# Patient Record
Sex: Female | Born: 1955 | Race: White | Hispanic: No | Marital: Married | Smoking: Never smoker
Health system: Southern US, Community
[De-identification: ages and names within clinical notes are randomized; demographics above are authoritative.]

## PROBLEM LIST (undated history)

## (undated) DIAGNOSIS — R519 Headache, unspecified: Secondary | ICD-10-CM

## (undated) DIAGNOSIS — Z87442 Personal history of urinary calculi: Secondary | ICD-10-CM

## (undated) DIAGNOSIS — R51 Headache: Secondary | ICD-10-CM

## (undated) DIAGNOSIS — R0600 Dyspnea, unspecified: Secondary | ICD-10-CM

## (undated) DIAGNOSIS — M8580 Other specified disorders of bone density and structure, unspecified site: Secondary | ICD-10-CM

## (undated) DIAGNOSIS — M859 Disorder of bone density and structure, unspecified: Secondary | ICD-10-CM

## (undated) DIAGNOSIS — C439 Malignant melanoma of skin, unspecified: Secondary | ICD-10-CM

## (undated) DIAGNOSIS — R112 Nausea with vomiting, unspecified: Secondary | ICD-10-CM

## (undated) DIAGNOSIS — E785 Hyperlipidemia, unspecified: Secondary | ICD-10-CM

## (undated) DIAGNOSIS — Z8781 Personal history of (healed) traumatic fracture: Secondary | ICD-10-CM

## (undated) DIAGNOSIS — Z531 Procedure and treatment not carried out because of patient's decision for reasons of belief and group pressure: Secondary | ICD-10-CM

## (undated) HISTORY — PX: OTHER SURGICAL HISTORY: SHX169

## (undated) HISTORY — PX: CHOLECYSTECTOMY: SHX55

## (undated) HISTORY — PX: OVARIAN CYST SURGERY: SHX726

## (undated) HISTORY — PX: ABDOMINAL HYSTERECTOMY: SHX81

## (undated) HISTORY — DX: Hyperlipidemia, unspecified: E78.5

---

## 1980-11-03 DIAGNOSIS — Z8781 Personal history of (healed) traumatic fracture: Secondary | ICD-10-CM

## 1980-11-03 HISTORY — DX: Personal history of (healed) traumatic fracture: Z87.81

## 1998-11-03 HISTORY — PX: COLONOSCOPY: SHX174

## 1998-11-26 ENCOUNTER — Other Ambulatory Visit: Admission: RE | Admit: 1998-11-26 | Discharge: 1998-11-26 | Payer: Self-pay | Admitting: Gynecology

## 1999-03-27 ENCOUNTER — Other Ambulatory Visit: Admission: RE | Admit: 1999-03-27 | Discharge: 1999-03-27 | Payer: Self-pay | Admitting: Gynecology

## 2000-02-24 ENCOUNTER — Other Ambulatory Visit: Admission: RE | Admit: 2000-02-24 | Discharge: 2000-02-24 | Payer: Self-pay | Admitting: Gynecology

## 2000-04-29 ENCOUNTER — Inpatient Hospital Stay (HOSPITAL_COMMUNITY): Admission: RE | Admit: 2000-04-29 | Discharge: 2000-05-01 | Payer: Self-pay | Admitting: Gynecology

## 2000-04-29 ENCOUNTER — Encounter (INDEPENDENT_AMBULATORY_CARE_PROVIDER_SITE_OTHER): Payer: Self-pay

## 2000-07-31 ENCOUNTER — Encounter (INDEPENDENT_AMBULATORY_CARE_PROVIDER_SITE_OTHER): Payer: Self-pay | Admitting: Specialist

## 2000-07-31 ENCOUNTER — Other Ambulatory Visit: Admission: RE | Admit: 2000-07-31 | Discharge: 2000-07-31 | Payer: Self-pay | Admitting: Gynecology

## 2001-03-24 ENCOUNTER — Encounter: Admission: RE | Admit: 2001-03-24 | Discharge: 2001-03-24 | Payer: Self-pay | Admitting: Gynecology

## 2001-03-24 ENCOUNTER — Encounter: Payer: Self-pay | Admitting: Gynecology

## 2001-07-13 ENCOUNTER — Other Ambulatory Visit: Admission: RE | Admit: 2001-07-13 | Discharge: 2001-07-13 | Payer: Self-pay | Admitting: Gynecology

## 2002-08-02 ENCOUNTER — Emergency Department (HOSPITAL_COMMUNITY): Admission: EM | Admit: 2002-08-02 | Discharge: 2002-08-02 | Payer: Self-pay | Admitting: Emergency Medicine

## 2002-08-02 ENCOUNTER — Encounter: Payer: Self-pay | Admitting: Emergency Medicine

## 2003-04-25 ENCOUNTER — Other Ambulatory Visit: Admission: RE | Admit: 2003-04-25 | Discharge: 2003-04-25 | Payer: Self-pay | Admitting: Family Medicine

## 2003-07-25 ENCOUNTER — Encounter: Payer: Self-pay | Admitting: Family Medicine

## 2003-07-25 ENCOUNTER — Encounter: Admission: RE | Admit: 2003-07-25 | Discharge: 2003-07-25 | Payer: Self-pay | Admitting: Family Medicine

## 2004-06-04 ENCOUNTER — Other Ambulatory Visit: Admission: RE | Admit: 2004-06-04 | Discharge: 2004-06-04 | Payer: Self-pay | Admitting: Internal Medicine

## 2004-06-04 ENCOUNTER — Encounter (INDEPENDENT_AMBULATORY_CARE_PROVIDER_SITE_OTHER): Payer: Self-pay | Admitting: Internal Medicine

## 2004-06-04 LAB — CONVERTED CEMR LAB: Pap Smear: NORMAL

## 2005-06-06 ENCOUNTER — Encounter: Admission: RE | Admit: 2005-06-06 | Discharge: 2005-06-06 | Payer: Self-pay | Admitting: Family Medicine

## 2005-06-20 ENCOUNTER — Encounter: Admission: RE | Admit: 2005-06-20 | Discharge: 2005-06-20 | Payer: Self-pay | Admitting: Family Medicine

## 2005-07-02 ENCOUNTER — Ambulatory Visit: Payer: Self-pay | Admitting: Family Medicine

## 2006-05-15 ENCOUNTER — Encounter: Admission: RE | Admit: 2006-05-15 | Discharge: 2006-05-15 | Payer: Self-pay | Admitting: Orthopedic Surgery

## 2006-11-18 ENCOUNTER — Ambulatory Visit: Payer: Self-pay | Admitting: Internal Medicine

## 2007-03-07 ENCOUNTER — Encounter (INDEPENDENT_AMBULATORY_CARE_PROVIDER_SITE_OTHER): Payer: Self-pay | Admitting: Internal Medicine

## 2007-03-17 ENCOUNTER — Ambulatory Visit: Payer: Self-pay | Admitting: Physician Assistant

## 2007-03-18 ENCOUNTER — Ambulatory Visit: Payer: Self-pay | Admitting: Physician Assistant

## 2007-03-30 ENCOUNTER — Ambulatory Visit: Payer: Self-pay | Admitting: Unknown Physician Specialty

## 2007-04-28 ENCOUNTER — Ambulatory Visit: Payer: Self-pay | Admitting: Pain Medicine

## 2007-06-23 ENCOUNTER — Telehealth (INDEPENDENT_AMBULATORY_CARE_PROVIDER_SITE_OTHER): Payer: Self-pay | Admitting: *Deleted

## 2007-06-29 ENCOUNTER — Telehealth (INDEPENDENT_AMBULATORY_CARE_PROVIDER_SITE_OTHER): Payer: Self-pay | Admitting: Internal Medicine

## 2007-07-01 ENCOUNTER — Telehealth (INDEPENDENT_AMBULATORY_CARE_PROVIDER_SITE_OTHER): Payer: Self-pay | Admitting: *Deleted

## 2007-08-04 ENCOUNTER — Telehealth (INDEPENDENT_AMBULATORY_CARE_PROVIDER_SITE_OTHER): Payer: Self-pay | Admitting: *Deleted

## 2007-08-11 ENCOUNTER — Encounter: Admission: RE | Admit: 2007-08-11 | Discharge: 2007-08-11 | Payer: Self-pay | Admitting: Family Medicine

## 2007-08-24 ENCOUNTER — Encounter (INDEPENDENT_AMBULATORY_CARE_PROVIDER_SITE_OTHER): Payer: Self-pay | Admitting: *Deleted

## 2007-08-25 ENCOUNTER — Encounter (INDEPENDENT_AMBULATORY_CARE_PROVIDER_SITE_OTHER): Payer: Self-pay | Admitting: Internal Medicine

## 2007-08-25 DIAGNOSIS — G43909 Migraine, unspecified, not intractable, without status migrainosus: Secondary | ICD-10-CM | POA: Insufficient documentation

## 2007-08-25 DIAGNOSIS — K219 Gastro-esophageal reflux disease without esophagitis: Secondary | ICD-10-CM | POA: Insufficient documentation

## 2007-08-25 DIAGNOSIS — F3289 Other specified depressive episodes: Secondary | ICD-10-CM | POA: Insufficient documentation

## 2007-08-25 DIAGNOSIS — F329 Major depressive disorder, single episode, unspecified: Secondary | ICD-10-CM | POA: Insufficient documentation

## 2007-08-25 DIAGNOSIS — M949 Disorder of cartilage, unspecified: Secondary | ICD-10-CM

## 2007-08-25 DIAGNOSIS — K5900 Constipation, unspecified: Secondary | ICD-10-CM | POA: Insufficient documentation

## 2007-08-25 DIAGNOSIS — M899 Disorder of bone, unspecified: Secondary | ICD-10-CM | POA: Insufficient documentation

## 2007-09-08 ENCOUNTER — Ambulatory Visit: Payer: Self-pay | Admitting: Family Medicine

## 2007-09-08 DIAGNOSIS — M549 Dorsalgia, unspecified: Secondary | ICD-10-CM | POA: Insufficient documentation

## 2007-09-08 DIAGNOSIS — G4700 Insomnia, unspecified: Secondary | ICD-10-CM | POA: Insufficient documentation

## 2007-10-14 ENCOUNTER — Telehealth (INDEPENDENT_AMBULATORY_CARE_PROVIDER_SITE_OTHER): Payer: Self-pay | Admitting: Internal Medicine

## 2010-06-25 ENCOUNTER — Encounter (INDEPENDENT_AMBULATORY_CARE_PROVIDER_SITE_OTHER): Payer: Self-pay | Admitting: *Deleted

## 2010-07-23 ENCOUNTER — Encounter: Payer: Self-pay | Admitting: Gastroenterology

## 2010-11-24 ENCOUNTER — Encounter: Payer: Self-pay | Admitting: Family Medicine

## 2010-12-03 NOTE — Progress Notes (Signed)
Summary: rx's (Teresa [name])  Medications Added CYMBALTA 60 MG  CPEP (DULOXETINE HCL) one tab by mouth once daily.       Phone Note Call from Patient Call back at Home Phone [phone] Call back at Work Phone [phone]   Caller: Patient Call For: bean Summary of Call: [name] seen in jan, she was given samples of cymbalta she just recently tried it and liked it wants rx called in, also needs ambien refill, uses [facility] [address] Initial call taken by: Liane Comber,  June 23, 2007 9:06 AM  Follow-up for Phone Call        Will assume [name] on 60mg  per day now. script written for one month...[facility]. Follow-up by: Shaune Leeks MD,  June 23, 2007 1:53 PM  Additional Follow-up for Phone Call Additional follow up Details #1::        Advised patient rx called in ..................................................................Marland KitchenMarcelle Smiling Tran Randle  June 23, 2007 2:16 PM     New/Updated Medications: CYMBALTA 60 MG  CPEP (DULOXETINE HCL) one tab by mouth once daily.   Prescriptions: CYMBALTA 60 MG  CPEP (DULOXETINE HCL) one tab by mouth once daily.  #30 x 0   Entered and Authorized by:   Shaune Leeks MD   Signed by:   Shaune Leeks MD on 06/23/2007   Method used:   Print then Give to Patient   RxID:   [id number]

## 2010-12-03 NOTE — Assessment & Plan Note (Signed)
Summary: PRESCRIPTION RENEWAL / LFW  Medications Added TRAZODONE HCL 100 MG  TABS (TRAZODONE HCL) 1 qhs ACTONEL 150 MG  TABS (RISEDRONATE SODIUM) 1 qmo      Allergies Added: NKDA  Vital Signs:  Patient Profile:   55 Years Old Female Weight:      141 pounds Temp:     98.2 degrees F oral Pulse rate:   52 / minute BP sitting:   124 / 76  (left arm) Cuff size:   regular  Vitals Entered By: Cooper Render (September 08, 2007 10:35 AM)                 Chief Complaint:  med refill.  History of Present Illness: Here for refill on Ambein, Actonel, wants some Cafergot to have on hand.  Has been seening Dr Gerrit Heck for back problems, has had steriod injection which was not much help. Had MRIs of entire spine--compression fx and broad based protrusion at several areas. Also saw Dr Renal--did the steriod injection.  Has used NSAIDS.  Current Allergies: No known allergies   Past Surgical History:    DEXA (DRI), lumbar osteopor, 03/24/01--  hip penia 07/07    Hysterectomy, TA - endometriosis-- 06/01    Colonoscopy-- 05/97    MRI lumbar spine 5/08--milk degenerative changes , possible compromise of R nerve root L5-S1    MRI cervical spine--5/08--mild broad based bulge C5-6    MRI  thoracid spine--5/08--ch compression defor mties involving T$ ana T8--no compression    totas body bone scan--5/08--see report.     Review of Systems      See HPI     Impression & Recommendations:  Problem # 1:  INSOMNIA, CHRONIC (ICD-307.42) Assessment: Unchanged because she wakes 2-3h after going to bed and is now taking half of an Ambein at hs and the other half when wakes requesting something else for sleep.  Will try Trazodone 100 at hs and continue Ambein if needed. Refusses to come back before 1/09 in followl ulp--to be away in December. Will not refill Trazodone past current Rx until seeen Did not refill Lunesta which she also requested.  Problem # 2:  BACK PAIN, CHRONIC (ICD-724.5)  Encouraged her to see someone in a pain center to follow her chronic pain.  She refused.  Will have to return to her Ortho to follow have reviewed her MRIs of entire back and bone scan--copies on chart. in exam room  Problem # 3:  OSTEOPENIA (ICD-733.90) with review of her MRIs that she brings reports of, find that she has had a compression fx of T8.  Will try her on Actonel 150 monthly--Rx and samples given. Her updated medication list for this problem includes:    Oscal 500/200 D-3 500-200 Mg-unit Tabs (Calcium-vitamin d) .Marland Kitchen... Take one by mouth once a day    Actonel 35 Mg Tabs (Risedronate sodium) .Marland Kitchen... Take one by mouth once a week    Actonel 150 Mg Tabs (Risedronate sodium) .Marland Kitchen... 1 qmo   Complete Medication List: 1)  Ambien 10 Mg Tabs (Zolpidem tartrate) .Marland Kitchen.. 1 at bedtime as needed sleep 2)  Oscal 500/200 D-3 500-200 Mg-unit Tabs (Calcium-vitamin d) .... Take one by mouth once a day 3)  Vitamin C  4)  Actonel 35 Mg Tabs (Risedronate sodium) .... Take one by mouth once a week 5)  Cafergot 1-100 Mg Tabs (Ergotamine-caffeine) .... As needed for headaches 6)  Trazodone Hcl 100 Mg Tabs (Trazodone hcl) .Marland Kitchen.. 1 qhs 7)  Actonel  150 Mg Tabs (Risedronate sodium) .Marland Kitchen.. 1 qmo     Prescriptions: CAFERGOT 1-100 MG  TABS (ERGOTAMINE-CAFFEINE) As needed for headaches  #30 x 4   Entered and Authorized by:   Gildardo Griffes FNP   Signed by:   Gildardo Griffes FNP on 09/08/2007   Method used:   Print then Give to Patient   RxID:   [id number] ACTONEL 150 MG  TABS (RISEDRONATE SODIUM) 1 qmo  #1 x 12   Entered and Authorized by:   Gildardo Griffes FNP   Signed by:   Gildardo Griffes FNP on 09/08/2007   Method used:   Print then Give to Patient   RxID:   [id number] AMBIEN 10 MG  TABS (ZOLPIDEM TARTRATE) 1 at bedtime as needed sleep  #30 x 6   Entered and Authorized by:   Gildardo Griffes FNP   Signed by:   Gildardo Griffes FNP on  09/08/2007   Method used:   Print then Give to Patient   RxID:   [id number] TRAZODONE HCL 100 MG  TABS (TRAZODONE HCL) 1 qhs  #30 x 1   Entered and Authorized by:   Gildardo Griffes FNP   Signed by:   Gildardo Griffes FNP on 09/08/2007   Method used:   Print then Give to Patient   RxID:   [id number]  ]

## 2010-12-03 NOTE — Progress Notes (Signed)
Summary: rx  Medications Added AMBIEN 10 MG  TABS (ZOLPIDEM TARTRATE) 1 at bedtime as needed sleep       Phone Note Call from Patient Call back at Work Phone [phone]   Caller: Patient Call For: bean Summary of Call: refill  for ambien cr 12.5 mg was faxed from pharmacy on 06/23/07, pt says [place] cr is to expensive and she likes plain Sauget better. request rx for plain ambien called in. Initial call taken by: Liane Comber,  July 01, 2007 9:25 AM  Follow-up for Phone Call        Rx ready fo Ambein Follow-up by: Gildardo Griffes FNP,  July 01, 2007 1:12 PM  Additional Follow-up for Phone Call Additional follow up Details #1::        Rx called to pharmacy ..................................................................Marland KitchenLiane Comber  July 01, 2007 2:07 PM   Advised patient ..................................................................Marland KitchenMarcelle Smiling Erian Lariviere  July 01, 2007 2:12 PM     New/Updated Medications: AMBIEN 10 MG  TABS (ZOLPIDEM TARTRATE) 1 at bedtime as needed sleep   Prescriptions: AMBIEN 10 MG  TABS (ZOLPIDEM TARTRATE) 1 at bedtime as needed sleep  #30 x 2   Entered and Authorized by:   Gildardo Griffes FNP   Signed by:   Gildardo Griffes FNP on 07/01/2007   Method used:   Print then Give to Patient   RxID:   [id number]

## 2010-12-03 NOTE — Letter (Signed)
Summary: Pre Visit Letter Revised  [facility]  [address] [place], [place] [address]   Phone: [phone]  Fax: [phone]        07/23/2010 MRN: [id number] Teresa [name] [address] [place], [place]  [address]             Procedure Date:  Nov 21 @ 11am   Welcome to the Gastroenterology Division at [facility].    You are scheduled to see a nurse for your pre-procedure visit on 09-10-10 at  9:30am on the 3rd floor at [facility], [address].  We ask that you try to arrive at our office 15 minutes prior to your appointment time to allow for check-in.  Please take a minute to review the attached form.  If you answer "Yes" to one or more of the questions on the first page, we ask that you call the person listed at your earliest opportunity.  If you answer "No" to all of the questions, please complete the rest of the form and bring it to your appointment.    Your nurse visit will consist of discussing your medical and surgical history, your immediate family medical history, and your medications.   If you are unable to list all of your medications on the form, please bring the medication bottles to your appointment and we will list them.  We will need to be aware of both prescribed and over the counter drugs.  We will need to know exact dosage information as well.    Please be prepared to read and sign documents such as consent forms, a financial agreement, and acknowledgement forms.  If necessary, and with your consent, a friend or relative is welcome to sit-in on the nurse visit with you.  Please bring your insurance card so that we may make a copy of it.  If your insurance requires a referral to see a specialist, please bring your referral form from your primary care physician.  No co-pay is required for this nurse visit.     If you cannot keep your appointment, please call [phone] to cancel or reschedule prior to your appointment date.  This allows  [place] the opportunity to schedule an appointment for another patient in need of care.    Thank you for choosing [facility] for your medical needs.  We appreciate the opportunity to care for you.  Please visit [place] at our website  to learn more about our practice.  Sincerely, The Gastroenterology Division

## 2010-12-03 NOTE — Letter (Signed)
Summary: Results Follow up Letter  [facility]  [address] [place], [place] [address]   Phone: [phone]  Fax: [phone]    08/24/2007 MRN: [id number]  Teresa [name] [address] [place], [place]  [address]  Dear Ms. [name],  The following are the results of your recent test(s):  Test         Result    Pap Smear:        Normal _____  Not Normal _____ Comments: ______________________________________________________ Cholesterol: LDL(Bad cholesterol):         Your goal is less than:         HDL (Good cholesterol):       Your goal is more than: Comments:  ______________________________________________________ Mammogram:        Normal __X___  Not Normal _____ Comments:  Repeat in one year please.  ___________________________________________________________________ Hemoccult:        Normal _____  Not normal _______ Comments:    _____________________________________________________________________ Other Tests:    We routinely do not discuss normal results over the telephone.  If you desire a copy of the results, or you have any questions about this information we can discuss them at your next office visit.   Sincerely,    Billie Bean,FNP/[place],CMA

## 2010-12-03 NOTE — Letter (Signed)
Summary: Previsit letter  [facility]  [address] [place], [place] [address]   Phone: [phone]  Fax: [phone]       06/25/2010 MRN: [id number]  Teresa [name] [address] [place], [place]  [address]  Dear Ms. [name],  Welcome to the Gastroenterology Division at [facility].    You are scheduled to see a nurse for your pre-procedure visit on 08/12/2010 at 8:30AM on the 3rd floor at [facility], [address].  We ask that you try to arrive at our office 15 minutes prior to your appointment time to allow for check-in.  Your nurse visit will consist of discussing your medical and surgical history, your immediate family medical history, and your medications.    Please bring a complete list of all your medications or, if you prefer, bring the medication bottles and we will list them.  We will need to be aware of both prescribed and over the counter drugs.  We will need to know exact dosage information as well.  If you are on blood thinners (Coumadin, Plavix, Aggrenox, Ticlid, etc.) please call our office today/prior to your appointment, as we need to consult with your physician about holding your medication.   Please be prepared to read and sign documents such as consent forms, a financial agreement, and acknowledgement forms.  If necessary, and with your consent, a friend or relative is welcome to sit-in on the nurse visit with you.  Please bring your insurance card so that we may make a copy of it.  If your insurance requires a referral to see a specialist, please bring your referral form from your primary care physician.  No co-pay is required for this nurse visit.     If you cannot keep your appointment, please call [phone] to cancel or reschedule prior to your appointment date.  This allows [place] the opportunity to schedule an appointment for another patient in need of care.    Thank you for choosing [facility] for your medical needs.   We appreciate the opportunity to care for you.  Please visit [place] at our website  to learn more about our practice.                     Sincerely.                                                                                                                   The Gastroenterology Division

## 2011-03-21 NOTE — Op Note (Signed)
[facility]  Patient:    Teresa [name], Teresa [name]                      MRN: [id number] Proc. Date: 04/29/00 Adm. Date:  [id number] Attending:  Merrily Pew                           Operative Report  PREOPERATIVE DIAGNOSIS:       Endometriosis.  POSTOPERATIVE DIAGNOSIS:      Endometriosis.  OPERATION:                    Total abdominal hysterectomy and bilateral salpingo-oophorectomy. Excision of peritoneal cyst.  Biopsy of peritoneum.  SURGEON:                      Timothy P. Fontaine, M.D.  ASSISTANT:                    [name] Rivers, M.D.  ANESTHESIA:                   General endotracheal anesthesia.  SPECIMEN:                     Uterus, fallopian tubes, and ovaries bilaterally.  Peritoneal biopsy.  Peritoneal inclusion cyst.  FINDINGS:                     The uterus was grossly normal.  Right and left fallopian tubes and ovaries grossly normal with right ovary showing a small area of endometriosis on the capsule.  2 cm dark subperitoneal inclusion-type cyst above and inferior to the insertion of the right round ligament, excised intact. Small area of apparent endometriosis right lateral pelvic side wall at the insertion f the right round ligament area, excised entirely.  Pelvis otherwise clear of any  gross evidence of endometriosis.  No pelvic adhesions.  Upper abdominal examination digitally without evidence of abdominal or intestinal adhesions.  ESTIMATED BLOOD LOSS:         150 cc.  COMPLICATIONS:                None.  DESCRIPTION OF PROCEDURE:     The patient was taken to the operating room and underwent general endotracheal anesthesia.  She was placed in the supine position and underwent an abdominal and vaginal preparation with Betadine scrub and Betadine solution and a Foley catheter was placed in sterile technique by the nursing personnel.  The patient was draped in the usual fashion.  The abdomen  was sharply entered through a repeat Pfannenstiel incision achieving adequate hemostasis at all levels.  The Balfour retractor and bladder blade were placed within the incision and the intestines packed from the operative field.  The uterus was elevated with Kelly clamps and the right round ligament was identified and transected with the electrocautery.  The anterior leaf of the broad ligament was then sharply incised to the level of the lower uterine segment.  The right ureter was identified and  found to be far from the site.  The infundibulopelvic ligament and vessels isolated and doubly clamped, cut, and doubly ligated using 0 Vicryl suture.  The posterior leaf of the broad ligament was then sharply incised to the level of the posterior lower uterine segment.  A similar procedure was then carried out on the other side. The right uterine vessels were  then skeletonized, clamped, cut, and doubly ligated using 0 Vicryl suture with a similar procedure being carried out on the other side. The uterus was then freed from its attachments through progressive clamping, cutting, and ligating of the parametrial and cardinal tissues using 0 Vicryl suture.  The bladder flap was progressively developed as needed sharply and bluntly without difficulty.  The vagina was then sharply entered and the uterus was sharply excised without difficulty.  Right and left vaginal angle sutures were then placed using 0 Vicryl suture and tagged for future reference.  The vaginal cuff was then run with a running interlocking stitch using 0 Vicryl suture and subsequently a  single interrupted 0 Vicryl suture was placed to close the vagina anterior to posterior.  The pelvis was copiously irrigated, adequate hemostasis visualized, and attention was then turned to the right lower abdomen with excision of both the inclusion cyst and the small area of endometriosis.  The small peritoneal defects were  electrocoagulated to achieve adequate hemostasis.  Subsequently, the pelvis again was irrigated and adequate hemostasis visualized, the bowel packing removed. Balfour and bladder blades were removed.  The anterior fascia was reapproximated using 0 Vicryl suture in a running stitch starting at the angle and meeting in he midline.  Subcutaneous tissues were irrigated.  Adequate hemostasis was visualized and the skin was reapproximated with staples.  A sterile dressing was applied. he patient was awakened and taken to the recovery room in good condition having tolerated the procedure well. DD:  04/29/00 TD:  04/30/00 Job: [id number]

## 2011-03-21 NOTE — Letter (Signed)
November 20, 2006    Craig Guess  [address] [place] [address]   RE:  TAMIKI, KUBA  MRN:  [id number]  /  DOB:  [birth year]   Dear Ms. [name]:   On your recent visit November 18, 2006, in our office at [facility] you  indicated that you had had a mammogram probably in July 2007 at the  [facility] [place] in followup of your abnormal mammogram of  June 06, 2005.  We called to get a copy of the report and find that you  did not have a mammogram at that time.   I am writing because I am concerned that this needs to be followed up.  I realize that on the ultrasound done June 20, 2005, that there were  no abnormalities found.  However, the recommendation was that you have  followup mammogram in 6 months and actually an appointment had been made  for you on December 08, 2005.  I would appreciate it if you would call us  to get a followup mammogram set up to lay this issue to rest.   If I can be of further assistance, please do not hesitate to call.    Sincerely,       Billie D. Bean, FNP  Electronically Signed      Arta Silence, MD  Electronically Signed   BDB/MedQ  DD: 11/20/2006  DT: 11/20/2006  Job #: [id number]

## 2011-03-21 NOTE — H&P (Signed)
[facility]  Patient:    Teresa [name], Teresa [name]                        MRN: [id number] Adm. Date:  04/29/00 Attending:  Marcial Pacas P. Fontaine, M.D.                         History and Physical  CHIEF COMPLAINT:              Increasing dysmenorrhea and history of endometriosis.  HISTORY OF PRESENT ILLNESS:   A 55 year old, G2, P28, AB1 female, currently on Ortho-Novum 135 birth control with a history of laparoscopic biopsy proven endometriosis with increasing dysmenorrhea menstrual and premenstrual abdominal bloating for total abdominal hysterectomy and bilateral salpingo-oophorectomy.  The patient has a long history of endometriosis with abdominal pain and dysmenorrhea.  She has undergone laparoscopy with lysis of abdominal pelvic adhesions and fulguration of endometriosis, the last being in November 1998 and her pain is becoming intolerable.  She underwent an outpatient ultrasound which shows a normal appearing uterus, right and left ovaries and no apparent pelvic abnormalities on ultrasound.  PAST MEDICAL HISTORY:         Significant for migraine headaches for which she takes Verapamil 120 q.d. and Trazodone at bedtime for sleep. The patient also has a significant history of osteoporosis with vertebral fractures for which she has been followed and is on birth control pill estrogen supplementation as well as calcium supplementation.  PAST SURGICAL HISTORY:        Ovarian wedge resection bilaterally in 1972. Diagnostic laparoscopy in 1994. Laparoscopy in 1998 with abdominal pelvic adhesion lysis and fulguration of endometriosis.  Excision of melena from her foot and appendectomy.  ALLERGIES:                    None.  CURRENT MEDICATIONS:          Verapamil 120 mg q.i.d. Trazodone q.h.s. Ortho-Novum 135.  FAMILY HISTORY:               Noncontributory.  FAMILY HISTORY:               Noncontributory without alcohol or cigarette use.  REVIEW OF SYSTEMS:             Noncontributory.  PHYSICAL EXAMINATION:  HEENT:                        Normal.  LUNGS:                        Clear.  CARDIAC:                      Regular rate without rubs, murmurs or gallops.  ABDOMEN:                      Benign.  PELVIC:                       External, BUS and vagina normal.  Cervix is normal.  Uterus normal size and nontender.  Adnexae without masses or tenderness.  RECTOVAGINAL:                 Normal. Stool guaiac negative.  ASSESSMENT:                   A  55 year old, G2, P1, AB1, female with a long history of abdominal pelvic pain. Latest laparoscopy in 1998 showing abdominal pelvic adhesions as well as endometriosis. Her dysmenorrhea and perimenstrual bloating and pain are becoming intolerable and she wants to proceed with definitive hysterectomy and bilateral salpingo-oophorectomy.  The proposed surgery as well as the intraoperative and postoperative expected courses were discussed with her and her husband.  Ovarian conservation issue was discussed with her and the issue of maintaining her ovaries for potential benefit as far as hormone production with the risks of future surgeries pertaining to ovarian disease particularly with her history of endometriosis as well as potential for ovarian cancer was reviewed versus removing her ovaries and the need for hormone replacement therapy particularly with her history of osteoporosis was also discussed.  The risks of hormone replacement was reviewed to include possible increased risks of breast cancer, as well as the risks of gallbladder disease and vascular thrombosis was discussed with her, understood and accepted and she does plan on using hormone replacement therapy.  Given her history of adhesions and endometriosis, I think the most prudent course is an abdominal approach which she certainly agrees with and the expected postoperative course with an abdominal incision was reviewed with her.  The risks of the surgery to include inadvertent injury to internal organs including bowel, bladder, ureters, vessels and nerves necessitating major exploratory repairative surgeries and future repairative surgeries including ostomy formation was discussed, understood and accepted.  She understands that patients with history of adhesive disease and inflammatory processes in the pelvis that she is at increased risk for inadvertent injuries and she does understand and accept this. The risks of wound complication requiring opening and draining of incisions as well as the risks of infection both abscess formation as well as cellulitis requiring opening and draining of abscess formations and prolonged antibiotics was discussed, understood and accepted. Given her history of pain, the patient clearly understands no guarantees as far as pain relief were made.  Her pain may persist, worsen or change following the procedure and she understands and accepts these potentials.  I also discussed with her that with her history of endometriosis there is no guarantee that we will remove all of her endometriosis and there may be microscopic disease that persists and in fact estrogen replacement therapy may stimulate these implants and theoretically may even cause endometrial type cancer within these implants and she understands and accepts this potential. Sexuality following hysterectomy was discussed with her and her husband, the potential for orgasmic dysfunction as well as persistent dyspareunia was discussed, understood and accepted.  Lastly, the patient is a Curator and as with prior surgeries, she reconfirmed her religious belief not to receive any blood or blood products even if a transfusion could easily save her and that she would ultimately die because of not receiving these products. She accepts this and would choose death over blood products.  Both her and her husband both agree with this.   They agree not to hold the physicians responsible for her care or the hospital responsible for her death in the event that a transfusion of blood or blood products are withheld and this  would lead to her death and they will sign the appropriate releases prior to surgery.  The patient and her husbands questions are answered to their satisfaction and they are ready to proceed with surgery. DD:  04/28/00 TD:  04/28/00 Job: [id number]

## 2011-03-21 NOTE — Discharge Summary (Signed)
[facility]  Patient:    Teresa [name], Teresa [name]                      MRN: [id number] Adm. Date:  [id number] Disc. Date: [id number] Attending:  Merrily Pew Dictator:   Antony Contras, [facility]                           Discharge Summary  DISCHARGE DIAGNOSIS:              Endometriosis.  PROCEDURES,                       Total abdominal hysterectomy, bilateral salpingo-oophorectomy, excision of peritoneal cyst, and biopsy of peritoneum.  HISTORY OF PRESENT ILLNESS:       The patient is a 55 year old, gravida 2, para 1, AB 1, currently Ortho-Novum 135 for birth control, with a history of laparoscopic biopsy-proven endometriosis.  Her dysmenorrhea and perimenstrual bloating were becoming intolerable, and she has decided to proceed with definitive therapy.  PAST MEDICAL HISTORY AND MEDICATIONS:  Significant for migraine headaches for which she takes verapamil 120 mg q.d. and Trazodone at bedtime for sleep. She also has a history of osteoporosis with vertebral fractures for which she has been followed.  She is on birth control pills, estrogen supplementation, and calcium.  HOSPITAL COURSE AND TREATMENT:    The patient was admitted on April 29, 2000, where total abdominal hysterectomy, bilateral salpingo-oophorectomy, excision of peritoneal cyst, and biopsy of peritoneum was performed by Dr. Colin Broach, assisted by Dr. [name] Rivers under general endotracheal anesthesia. The patient tolerated the procedure well postoperatively.  She remained afebrile.  She had some initial difficulty passing flatus which improved by her second postoperative day.  Postoperative CBC: Hematocrit 32.9, hemoglobin 11, WBC 11.9, platelets 276.  She as able to be discharged on her second postoperative day in satisfactory condition.  DISPOSITION:                      She is to be followed in the office in two weeks.  She was begun on estradiol 1 mg and Tylox for pain. DD:   05/25/00 TD:  05/27/00 Job: [id number]

## 2012-11-03 HISTORY — PX: EYE SURGERY: SHX253

## 2013-03-01 ENCOUNTER — Ambulatory Visit: Payer: Self-pay | Admitting: Ophthalmology

## 2013-10-19 ENCOUNTER — Ambulatory Visit (INDEPENDENT_AMBULATORY_CARE_PROVIDER_SITE_OTHER): Payer: BC Managed Care – PPO | Admitting: General Surgery

## 2013-10-19 ENCOUNTER — Encounter: Payer: Self-pay | Admitting: General Surgery

## 2013-10-19 VITALS — BP 128/74 | Ht 66.0 in | Wt 134.0 lb

## 2013-10-19 DIAGNOSIS — L0231 Cutaneous abscess of buttock: Secondary | ICD-10-CM

## 2013-10-19 DIAGNOSIS — Z1211 Encounter for screening for malignant neoplasm of colon: Secondary | ICD-10-CM

## 2013-10-19 LAB — POC HEMOCCULT BLD/STL (OFFICE/1-CARD/DIAGNOSTIC): Fecal Occult Blood, POC: NEGATIVE

## 2013-10-19 NOTE — Progress Notes (Signed)
Patient ID: Teresa [name], female   DOB: May 09, 1956, 57 y.o.   MRN: [id number]  Chief Complaint  Patient presents with  . Other    Peri Anal cyst    HPI Teresa [name] is a 57 y.o. female who presents for an evaluation of a peri anal cyst.. She went to see Dr. Adolphus Birchwood on 10/05/13. Patient states the area has busted and drained about two weeks ago. Patient states she has never had any problems like this before.  The area has markedly decreased in size since drainage. The patient was treated with minocycline, 100 mg twice a day as well as an injection of 2 milligrams of Kenalog.  The patient reports that she lost about 30 pounds over the last year with the modest modification of her diet. Her husband was utilizing the same diet but lost significantly less weight.  She reports no change in bowel function except perhaps a slightly more irregular stool. No blood corrected. No dietary intolerance. No change in exercise tolerance. She reports that she's had vasomotor symptoms since a hysterectomy 14 years ago. These are unchanged. HPI  Past Medical History  Diagnosis Date  . Hyperlipidemia     Past Surgical History  Procedure Laterality Date  . Abdominal hysterectomy    . Colonoscopy  2000  . Eye surgery Left 2014    History reviewed. No pertinent family history.  Social History History  Substance Use Topics  . Smoking status: Never Smoker   . Smokeless tobacco: Never Used  . Alcohol Use: No    No Known Allergies  Current Outpatient Prescriptions  Medication Sig Dispense Refill  . traZODone (DESYREL) 100 MG tablet Take 100 mg by mouth at bedtime.      Marland Kitchen zolpidem (AMBIEN) 5 MG tablet Take 5 mg by mouth once.       No current facility-administered medications for this visit.    Review of Systems Review of Systems  Constitutional: Negative.   Respiratory: Negative.   Cardiovascular: Negative.   Gastrointestinal: Negative.     Blood pressure 128/74, height 5\' 6"  (1.676 m),  weight 134 lb (60.782 kg).  Physical Exam Physical Exam  Constitutional: She is oriented to person, place, and time. She appears well-developed and well-nourished.  Eyes: No scleral icterus.  Cardiovascular: Normal rate and regular rhythm.   Murmur heard.  Systolic murmur is present with a grade of 2/6  Pulmonary/Chest: Breath sounds normal.  Abdominal: Soft. Normal appearance and bowel sounds are normal. There is no hepatosplenomegaly. There is no tenderness.  Genitourinary:     Thickeng at 9 o'clock 3 cm from anus.  Lymphadenopathy:    She has no cervical adenopathy.    She has no axillary adenopathy.       Right: No inguinal adenopathy present.       Left: No inguinal adenopathy present.  Neurological: She is alert and oriented to person, place, and time.  Skin: Skin is warm and dry.    Data Reviewed Office notes of [name], M.D.   Assessment    Perianal abscess, resolving. Unlikely fistula in ano.  Candidate for screening colonoscopy.    Plan    The area involved is now smooth, nontender with a tiny punctate opening. No thickening between this location and the anus can be appreciated. Prior to excision of this area, I would wait and see if she develops another episode of acute inflammation. The patient reports that the more than 10 years since her last screening colonoscopy.  This was completed in [place]. She was offered to have it rescheduled through the [place] facility or have it done locally. She'll notify the office of how we can be of assistance.     Patient wishes to check with her insurance company first and call back to arrange colonoscopy.  Earline Mayotte 10/20/2013, 9:42 AM

## 2013-10-19 NOTE — Patient Instructions (Signed)
Colonoscopy A colonoscopy is an exam to evaluate your entire colon. In this exam, your colon is cleansed. A long fiberoptic tube is inserted through your rectum and into your colon. The fiberoptic scope (endoscope) is a long bundle of enclosed and very flexible fibers. These fibers transmit light to the area examined and send images from that area to your caregiver. Discomfort is usually minimal. You may be given a drug to help you sleep (sedative) during or prior to the procedure. This exam helps to detect lumps (tumors), polyps, inflammation, and areas of bleeding. Your caregiver may also take a small piece of tissue (biopsy) that will be examined under a microscope. LET YOUR CAREGIVER KNOW ABOUT:   Allergies to food or medicine.  Medicines taken, including vitamins, herbs, eyedrops, over-the-counter medicines, and creams.  Use of steroids (by mouth or creams).  Previous problems with anesthetics or numbing medicines.  History of bleeding problems or blood clots.  Previous surgery.  Other health problems, including diabetes and kidney problems.  Possibility of pregnancy, if this applies. BEFORE THE PROCEDURE   A clear liquid diet may be required for 2 days before the exam.  Ask your caregiver about changing or stopping your regular medications.  Liquid injections (enemas) or laxatives may be required.  A large amount of electrolyte solution may be given to you to drink over a short period of time. This solution is used to clean out your colon.  You should be present 60 minutes prior to your procedure or as directed by your caregiver. AFTER THE PROCEDURE   If you received a sedative or pain relieving medication, you will need to arrange for someone to drive you home.  Occasionally, there is a little blood passed with the first bowel movement. Do not be concerned. FINDING OUT THE RESULTS OF YOUR TEST Not all test results are available during your visit. If your test results are  not back during the visit, make an appointment with your caregiver to find out the results. Do not assume everything is normal if you have not heard from your caregiver or the medical facility. It is important for you to follow up on all of your test results. HOME CARE INSTRUCTIONS   It is not unusual to pass moderate amounts of gas and experience mild abdominal cramping following the procedure. This is due to air being used to inflate your colon during the exam. Walking or a warm pack on your belly (abdomen) may help.  You may resume all normal meals and activities after sedatives and medicines have worn off.  Only take over-the-counter or prescription medicines for pain, discomfort, or fever as directed by your caregiver. Do not use aspirin or blood thinners if a biopsy was taken. Consult your caregiver for medicine usage if biopsies were taken. SEEK IMMEDIATE MEDICAL CARE IF:   You have a fever.  You pass large blood clots or fill a toilet with blood following the procedure. This may also occur 10 to 14 days following the procedure. This is more likely if a biopsy was taken.  You develop abdominal pain that keeps getting worse and cannot be relieved with medicine. Document Released: 10/17/2000 Document Revised: 01/12/2012 Document Reviewed: 05/23/2013 [employer] Patient Information 2014 [employer].  

## 2013-10-20 ENCOUNTER — Encounter: Payer: Self-pay | Admitting: General Surgery

## 2013-10-20 DIAGNOSIS — Z1211 Encounter for screening for malignant neoplasm of colon: Secondary | ICD-10-CM | POA: Insufficient documentation

## 2013-11-10 ENCOUNTER — Ambulatory Visit: Payer: Self-pay | Admitting: General Surgery

## 2014-08-14 ENCOUNTER — Emergency Department (HOSPITAL_COMMUNITY)
Admission: EM | Admit: 2014-08-14 | Discharge: 2014-08-14 | Disposition: A | Discharge status: Home / Self Care | Payer: BC Managed Care – PPO | Attending: Emergency Medicine | Admitting: Emergency Medicine

## 2014-08-14 ENCOUNTER — Emergency Department (HOSPITAL_COMMUNITY): Payer: BC Managed Care – PPO

## 2014-08-14 ENCOUNTER — Encounter (HOSPITAL_COMMUNITY): Payer: Self-pay | Admitting: Emergency Medicine

## 2014-08-14 DIAGNOSIS — H539 Unspecified visual disturbance: Secondary | ICD-10-CM | POA: Diagnosis not present

## 2014-08-14 DIAGNOSIS — Z8679 Personal history of other diseases of the circulatory system: Secondary | ICD-10-CM | POA: Diagnosis not present

## 2014-08-14 DIAGNOSIS — R51 Headache: Secondary | ICD-10-CM | POA: Insufficient documentation

## 2014-08-14 DIAGNOSIS — R112 Nausea with vomiting, unspecified: Secondary | ICD-10-CM | POA: Diagnosis not present

## 2014-08-14 DIAGNOSIS — R519 Headache, unspecified: Secondary | ICD-10-CM

## 2014-08-14 DIAGNOSIS — Z8639 Personal history of other endocrine, nutritional and metabolic disease: Secondary | ICD-10-CM | POA: Insufficient documentation

## 2014-08-14 LAB — CBC WITH DIFFERENTIAL/PLATELET
Basophils Absolute: 0 10*3/uL (ref 0.0–0.1)
Basophils Relative: 0 % (ref 0–1)
Eosinophils Absolute: 0 10*3/uL (ref 0.0–0.7)
Eosinophils Relative: 1 % (ref 0–5)
HCT: 39.6 % (ref 36.0–46.0)
Hemoglobin: 13.3 g/dL (ref 12.0–15.0)
Lymphocytes Relative: 23 % (ref 12–46)
Lymphs Abs: 2 10*3/uL (ref 0.7–4.0)
MCH: 27.9 pg (ref 26.0–34.0)
MCHC: 33.6 g/dL (ref 30.0–36.0)
MCV: 83.2 fL (ref 78.0–100.0)
Monocytes Absolute: 1.1 10*3/uL — ABNORMAL HIGH (ref 0.1–1.0)
Monocytes Relative: 13 % — ABNORMAL HIGH (ref 3–12)
Neutro Abs: 5.6 10*3/uL (ref 1.7–7.7)
Neutrophils Relative %: 63 % (ref 43–77)
Platelets: 304 10*3/uL (ref 150–400)
RBC: 4.76 MIL/uL (ref 3.87–5.11)
RDW: 13 % (ref 11.5–15.5)
WBC: 8.8 10*3/uL (ref 4.0–10.5)

## 2014-08-14 LAB — BASIC METABOLIC PANEL
Anion gap: 14 (ref 5–15)
BUN: 11 mg/dL (ref 6–23)
CO2: 26 mEq/L (ref 19–32)
Calcium: 10.2 mg/dL (ref 8.4–10.5)
Chloride: 99 mEq/L (ref 96–112)
Creatinine, Ser: 0.77 mg/dL (ref 0.50–1.10)
GFR calc Af Amer: >90 mL/min (ref >90)
GFR calc non Af Amer: 89 mL/min — ABNORMAL LOW (ref >90)
Glucose, Bld: 94 mg/dL (ref 70–99)
Potassium: 4 mEq/L (ref 3.7–5.3)
Sodium: 139 mEq/L (ref 137–147)

## 2014-08-14 MED ORDER — METOCLOPRAMIDE HCL 5 MG/ML IJ SOLN
5.0000 mg | Freq: Once | INTRAMUSCULAR | Status: AC
  Administered 2014-08-14: 5 mg via INTRAVENOUS
  Filled 2014-08-14: qty 2

## 2014-08-14 MED ORDER — SODIUM CHLORIDE 0.9 % IV BOLUS (SEPSIS)
1000.0000 mL | Freq: Once | INTRAVENOUS | Status: AC
  Administered 2014-08-14: 1000 mL via INTRAVENOUS

## 2014-08-14 MED ORDER — DEXAMETHASONE SODIUM PHOSPHATE 10 MG/ML IJ SOLN
10.0000 mg | Freq: Once | INTRAMUSCULAR | Status: DC
Start: 2014-08-14 — End: 2014-08-14

## 2014-08-14 MED ORDER — IOHEXOL 350 MG/ML SOLN
50.0000 mL | Freq: Once | INTRAVENOUS | Status: AC | PRN
  Administered 2014-08-14: 50 mL via INTRAVENOUS

## 2014-08-14 MED ORDER — DIPHENHYDRAMINE HCL 50 MG/ML IJ SOLN
25.0000 mg | Freq: Once | INTRAMUSCULAR | Status: AC
  Administered 2014-08-14: 25 mg via INTRAVENOUS
  Filled 2014-08-14: qty 1

## 2014-08-14 MED ORDER — DEXAMETHASONE 4 MG PO TABS
10.0000 mg | ORAL_TABLET | Freq: Once | ORAL | Status: AC
  Administered 2014-08-14: 10 mg via ORAL
  Filled 2014-08-14: qty 3

## 2014-08-14 NOTE — ED Notes (Signed)
Pt sts she was sent from ENT Dr Zadie Rhine to have CT scan to rule out head bleed due to pt noted to have migraine since yesterday, and L eye busted blood vessel.

## 2014-08-14 NOTE — ED Notes (Addendum)
Pt states that she saw Dr. Zadie Rhine-- opthalmologist -- on Friday for retinal vessel rupture---stated was able to see blood in eye-- today feels like left eye is bleeding more. Started having a headache yesterday on top of head and stiff neck. Has taken 2 hydrocodone this morning. States "today is there worst headache I have ever had, yesterday was a migraine". States this does not feel like her migraines. States Friday at 11-- vision started becoming "like looking through black lace".

## 2014-08-14 NOTE — ED Notes (Signed)
Pt sts had vision loss in left eye last week seen by eye doctor and sts has bleed in left eye that hoping to resolve on its own; pt sts HA yesterday with N/V and sent here today for eval to r/o aneurysm

## 2014-08-14 NOTE — ED Notes (Signed)
Patient transported to CT 

## 2014-08-14 NOTE — ED Provider Notes (Signed)
CSN: [id number]     Arrival date & time 08/14/14  1018 History   First MD Initiated Contact with Patient 08/14/14 1210     Chief Complaint  Patient presents with  . Headache     (Consider location/radiation/quality/duration/timing/severity/associated sxs/prior Treatment) Patient is a 58 y.o. female presenting with headaches. The history is provided by the patient.  Headache Location: vertex. Quality:  Dull Radiates to: neck. Severity currently:  7/10 Severity at highest:  10/10 Onset quality:  Gradual Duration:  1 day Timing:  Constant Progression:  Improving Chronicity:  New Similar to prior headaches: no   Context comment:  Awoke from sleep. Relieved by: narcotics. Worsened by:  Nothing tried Ineffective treatments:  None tried Associated symptoms: nausea and vomiting   Associated symptoms: no abdominal pain, no back pain, no congestion, no cough, no diarrhea, no dizziness, no pain, no fatigue, no fever and no neck pain     Past Medical History  Diagnosis Date  . Hyperlipidemia    Past Surgical History  Procedure Laterality Date  . Abdominal hysterectomy    . Colonoscopy  2000  . Eye surgery Left 2014   History reviewed. No pertinent family history. History  Substance Use Topics  . Smoking status: Never Smoker   . Smokeless tobacco: Never Used  . Alcohol Use: No   OB History   Grav Para Term Preterm Abortions TAB SAB Ect Mult Living   3 1   2  2   1      Obstetric Comments   1st Menstrual Cycle:   1st Pregnancy:  20     Review of Systems  Constitutional: Negative for fever and fatigue.  HENT: Negative for congestion and drooling.   Eyes: Positive for visual disturbance. Negative for pain.  Respiratory: Negative for cough and shortness of breath.   Cardiovascular: Negative for chest pain.  Gastrointestinal: Positive for nausea and vomiting. Negative for abdominal pain and diarrhea.  Genitourinary: Negative for dysuria and hematuria.  Musculoskeletal:  Negative for back pain, gait problem and neck pain.  Skin: Negative for color change.  Neurological: Positive for headaches. Negative for dizziness.  Hematological: Negative for adenopathy.  Psychiatric/Behavioral: Negative for behavioral problems.  All other systems reviewed and are negative.     Allergies  Review of patient's allergies indicates no known allergies.  Home Medications   Prior to Admission medications   Medication Sig Start Date End Date Taking? Authorizing Provider  HYDROcodone-acetaminophen (NORCO/VICODIN) 5-325 MG per tablet Take 1-2 tablets by mouth every 6 (six) hours as needed for moderate pain or severe pain.   Yes Historical Provider, MD  traZODone (DESYREL) 100 MG tablet Take 100 mg by mouth at bedtime.   Yes Historical Provider, MD  zolpidem (AMBIEN) 10 MG tablet Take 10 mg by mouth at bedtime as needed for sleep.   Yes Historical Provider, MD   BP 134/90  Pulse 76  Temp(Src) 98 F (36.7 C) (Oral)  Resp 22  Ht 5\' 6"  (1.676 m)  Wt 120 lb (54.432 kg)  BMI 19.38 kg/m2  SpO2 100% Physical Exam  Nursing note and vitals reviewed. Constitutional: She is oriented to person, place, and time. She appears well-developed and well-nourished.  HENT:  Head: Normocephalic and atraumatic.  Mouth/Throat: Oropharynx is clear and moist. No oropharyngeal exudate.  Eyes: Conjunctivae and EOM are normal. Pupils are equal, round, and reactive to light.  20/20 vision in R eye 20/50 vision in L eye  Neck: Normal range of motion. Neck supple.  Cardiovascular: Normal rate, regular rhythm, normal heart sounds and intact distal pulses.  Exam reveals no gallop and no friction rub.   No murmur heard. Pulmonary/Chest: Effort normal and breath sounds normal. No respiratory distress. She has no wheezes.  Abdominal: Soft. Bowel sounds are normal. There is no tenderness. There is no rebound and no guarding.  Musculoskeletal: Normal range of motion. She exhibits no edema and no  tenderness.  Neurological: She is alert and oriented to person, place, and time.  alert, oriented x3 speech: normal in context and clarity memory: intact grossly cranial nerves II-XII: intact w/ exception of vision changes in left eye motor strength: full proximally and distally no involuntary movements or tremors sensation: intact to light touch diffusely  cerebellar: finger-to-nose and heel-to-shin intact gait: normal forwards and backwards   Skin: Skin is warm and dry.  Psychiatric: She has a normal mood and affect. Her behavior is normal.    ED Course  Procedures (including critical care time) Labs Review Labs Reviewed  CBC WITH DIFFERENTIAL - Abnormal; Notable for the following:    Monocytes Relative 13 (*)    Monocytes Absolute 1.1 (*)    All other components within normal limits  BASIC METABOLIC PANEL - Abnormal; Notable for the following:    GFR calc non Af Amer 89 (*)    All other components within normal limits    Imaging Review Ct Angio Head W/cm &/or Wo Cm  08/14/2014   CLINICAL DATA:  Left side vision loss. Headache yesterday. Rule out aneurysm.  EXAM: CT ANGIOGRAPHY HEAD  TECHNIQUE: Multidetector CT imaging of the head was performed using the standard protocol during bolus administration of intravenous contrast. Multiplanar CT image reconstructions and MIPs were obtained to evaluate the vascular anatomy.  CONTRAST:  65mL OMNIPAQUE IOHEXOL 350 MG/ML SOLN  COMPARISON:  None.  FINDINGS: Ventricle size is normal. Negative for intracranial hemorrhage. No acute infarct or mass lesion. Calvarium is intact.  Both vertebral arteries are equal in size and patent to the basilar. PICA patent bilaterally. Basilar widely patent. AICA, superior cerebellar, and posterior cerebral arteries widely patent. Left posterior cerebral artery origin from the carotid artery with hypoplastic left P1 segment.  Cavernous carotid is widely patent bilaterally. Hypoplastic left A1 segment which is  patent. Anterior and middle cerebral arteries are patent without significant stenosis.  Negative for cerebral aneurysm.  No vascular malformation.  Review of the MIP images confirms the above findings.  IMPRESSION: Negative for intracranial hemorrhage.  No acute infarct or mass.  Negative CTA of the head.  Negative for cerebral aneurysm.   Electronically Signed   By: Franchot Gallo M.D.   On: 08/14/2014 14:34     EKG Interpretation None      MDM   Final diagnoses:  HA (headache)    1:17 PM 58 y.o. female who presents at the request of her retina specialist for evaluation of her headache. She states that she has a history of migraines. She states that she awoke yesterday with a dull throbbing headache on the vertex of her head which intensified throughout the day. She also had some nausea and vomiting. She notes his headache was worse than previous migraines. Yesterday her headache was a 10 and today it is a 7. She has taken tramadol and hydrocodone with mild relief. She does note a pulsation in her ears bilaterally yesterday but not today. She denies fever or head trauma. Last Friday she developed some black streaks in the vision of her left  eye and was evaluated by Dr. Zadie Rhine, a retina specialist. He suspects that she has a bleeding retinal vessel causing these vision changes (per her). Given the headache and pulsations in her ears she was recommended to come to the ER to rule out aneurysm. With the exception of the vision changes in her left eye she otherwise has a normal neurologic exam.  3:14 PM: I interpreted/reviewed the labs and/or imaging which were non-contributory.  HA now a 4/10, pt continues to appear well. She would like to go home.  I have discussed the diagnosis/risks/treatment options with the patient and believe the pt to be eligible for discharge home to follow-up with Dr. Marissa Nestle for her visual complaints. We also discussed returning to the ED immediately if new or worsening sx  occur. We discussed the sx which are most concerning (e.g., worsening HA, fever) that necessitate immediate return. Medications administered to the patient during their visit and any new prescriptions provided to the patient are listed below.  Medications given during this visit Medications  dexamethasone (DECADRON) injection 10 mg (not administered)  sodium chloride 0.9 % bolus 1,000 mL (0 mLs Intravenous Stopped 08/14/14 1447)  metoCLOPramide (REGLAN) injection 5 mg (5 mg Intravenous Given 08/14/14 1322)  diphenhydrAMINE (BENADRYL) injection 25 mg (25 mg Intravenous Given 08/14/14 1322)  iohexol (OMNIPAQUE) 350 MG/ML injection 50 mL (50 mLs Intravenous Contrast Given 08/14/14 1356)    New Prescriptions   No medications on file     Pamella Pert, MD 08/14/14 1539

## 2014-09-04 ENCOUNTER — Encounter (HOSPITAL_COMMUNITY): Payer: Self-pay | Admitting: Emergency Medicine

## 2014-09-04 DIAGNOSIS — I10 Essential (primary) hypertension: Secondary | ICD-10-CM | POA: Insufficient documentation

## 2015-03-07 ENCOUNTER — Other Ambulatory Visit: Payer: Self-pay | Admitting: Family Medicine

## 2015-03-07 DIAGNOSIS — M810 Age-related osteoporosis without current pathological fracture: Secondary | ICD-10-CM

## 2015-03-08 ENCOUNTER — Other Ambulatory Visit: Payer: Self-pay | Admitting: Family Medicine

## 2015-03-08 DIAGNOSIS — R109 Unspecified abdominal pain: Secondary | ICD-10-CM

## 2015-03-08 DIAGNOSIS — Z01419 Encounter for gynecological examination (general) (routine) without abnormal findings: Secondary | ICD-10-CM

## 2015-03-09 ENCOUNTER — Ambulatory Visit
Admission: RE | Admit: 2015-03-09 | Discharge: 2015-03-09 | Disposition: A | Discharge status: Home / Self Care | Payer: BLUE CROSS/BLUE SHIELD | Source: Ambulatory Visit | Attending: Family Medicine | Admitting: Family Medicine

## 2015-03-09 DIAGNOSIS — R109 Unspecified abdominal pain: Secondary | ICD-10-CM | POA: Diagnosis not present

## 2015-03-12 ENCOUNTER — Ambulatory Visit: Payer: Self-pay | Admitting: General Surgery

## 2015-03-22 ENCOUNTER — Ambulatory Visit: Payer: Self-pay | Admitting: General Surgery

## 2015-03-26 ENCOUNTER — Ambulatory Visit: Payer: Self-pay

## 2015-04-05 ENCOUNTER — Ambulatory Visit: Payer: Self-pay | Admitting: Gastroenterology

## 2015-04-10 ENCOUNTER — Telehealth: Payer: Self-pay

## 2015-04-10 ENCOUNTER — Telehealth: Payer: Self-pay | Admitting: *Deleted

## 2015-04-10 ENCOUNTER — Ambulatory Visit
Admission: RE | Admit: 2015-04-10 | Discharge: 2015-04-10 | Disposition: A | Discharge status: Home / Self Care | Payer: BLUE CROSS/BLUE SHIELD | Source: Ambulatory Visit | Attending: Family Medicine | Admitting: Family Medicine

## 2015-04-10 DIAGNOSIS — M8580 Other specified disorders of bone density and structure, unspecified site: Secondary | ICD-10-CM | POA: Insufficient documentation

## 2015-04-10 DIAGNOSIS — Z01419 Encounter for gynecological examination (general) (routine) without abnormal findings: Secondary | ICD-10-CM

## 2015-04-10 DIAGNOSIS — Z0000 Encounter for general adult medical examination without abnormal findings: Secondary | ICD-10-CM | POA: Diagnosis present

## 2015-04-10 MED ORDER — ALENDRONATE SODIUM 70 MG PO TABS
ORAL_TABLET | ORAL | Status: DC
Start: 2015-04-10 — End: 2015-10-10

## 2015-04-10 NOTE — Telephone Encounter (Signed)
Patient called stating she has a history of Migraine headaches. Since her last office visit they have been getting worse. Patient states she had a hysterectomy and wants to know if Dr. Caryn Section thinks the migraines and the Bone density result have anything to do with Hormone levels. She wants to know if she should be on estrogen and if so could this improve her migraines and bone density results?

## 2015-04-10 NOTE — Telephone Encounter (Signed)
-----   Message from Birdie Sons, MD sent at 04/10/2015 10:19 AM EDT ----- Please advise patient that BMD shows severe osteoporosis in her spine. She needs to start Fosamax 70mg  one tablet weekly in the morning 30 minutes before eating. #4, rf x 12. Need to repeat BMD in 1 year.

## 2015-04-10 NOTE — Telephone Encounter (Signed)
Patient was notified by Meyer Cory. Patient expressed understanding. Rx sent to pharmacy.

## 2015-04-10 NOTE — Telephone Encounter (Signed)
Estrogen is usually contraindicated in people with migraines because it increases stoke risk. It would probably help with bone density, but has many more adverse effects than Fosamax. If she is having frequent migraines, she should get a referral to headache specialist for further evaluation.

## 2015-04-12 NOTE — Telephone Encounter (Signed)
Spoke with pt and advised her as below. Patient declines referral to headache specialist at this time. She states she see's a Neurologist (Dr. Melrose Nakayama).

## 2015-04-16 ENCOUNTER — Ambulatory Visit (INDEPENDENT_AMBULATORY_CARE_PROVIDER_SITE_OTHER): Payer: BLUE CROSS/BLUE SHIELD | Admitting: Gastroenterology

## 2015-04-16 ENCOUNTER — Other Ambulatory Visit: Payer: Self-pay

## 2015-04-16 ENCOUNTER — Encounter: Payer: Self-pay | Admitting: Gastroenterology

## 2015-04-16 ENCOUNTER — Telehealth: Payer: Self-pay

## 2015-04-16 VITALS — BP 122/64 | HR 83 | Temp 98.4°F | Ht 66.0 in | Wt 126.0 lb

## 2015-04-16 DIAGNOSIS — K5900 Constipation, unspecified: Secondary | ICD-10-CM | POA: Diagnosis not present

## 2015-04-16 NOTE — Telephone Encounter (Signed)
Pt had an appt with Dr. Allen Norris today. Colonoscopy was scheduled during this visit.

## 2015-04-16 NOTE — Telephone Encounter (Signed)
-----   Message from Lucilla Lame, MD sent at 04/16/2015 11:58 AM EDT ----- Patient needs a colonoscopy

## 2015-04-16 NOTE — Progress Notes (Signed)
Gastroenterology Consultation  Referring Provider:     Birdie Sons, MD Primary Care Physician:  Lelon Huh, MD Primary Gastroenterologist:  Dr. Allen Norris     Reason for Consultation:      Bloating and abd. pan.        HPI:   Teresa [name] is a 59 y.o. y/o female referred for consultation & management of abdominal pain and bloating by Dr. Lelon Huh, MD.  This patient comes today with a report of having constipation and bloating. The patient reports that her symptoms have been worse over the last few months. She also reports that she has been having because she is not eating as much as she had been in the past. The patient has a long history of constipation and states that it has recently gotten worse. She also reports that she has not had a colonoscopy in over 15 years and is due for a repeat screening colonoscopy. The patient states that she has had multiple surgeries in the past and she has even had surgeries to remove adhesions. The patient feels like adhesions have reformed and may be causing a lot of her abdominal discomfort. She denies that eating or drinking makes her symptoms any better or worse. She also reports that she takes multiple lactose throughout the day to help her move her bowels. At time she states she has taken up to 6 or 8 laxity is right before going to sleep.  Past Medical History  Diagnosis Date  . Hyperlipidemia     Past Surgical History  Procedure Laterality Date  . Abdominal hysterectomy    . Colonoscopy  2000  . Eye surgery Left 2014    Prior to Admission medications   Medication Sig Start Date End Date Taking? Authorizing Provider  alendronate (FOSAMAX) 70 MG tablet 1 tablet weekly in the morning 30 minutes before eating. Take with a full glass of water on an empty stomach. 04/10/15  Yes Birdie Sons, MD  HYDROcodone-acetaminophen (NORCO/VICODIN) 5-325 MG per tablet Take 1-2 tablets by mouth every 6 (six) hours as needed for moderate pain or  severe pain.   Yes Historical Provider, MD  NAPROXEN DR 500 MG EC tablet Take 500 mg by mouth daily. 04/14/15  Yes Historical Provider, MD  nortriptyline (PAMELOR) 50 MG capsule Take 50 mg by mouth as needed. 04/05/15  Yes Historical Provider, MD  traMADol (ULTRAM) 50 MG tablet Take 50 mg by mouth daily as needed. 03/18/15  Yes Historical Provider, MD  traZODone (DESYREL) 100 MG tablet Take 100 mg by mouth at bedtime.   Yes Historical Provider, MD  zolpidem (AMBIEN) 10 MG tablet Take 10 mg by mouth at bedtime as needed for sleep.   Yes Historical Provider, MD  methylPREDNISolone (MEDROL DOSEPAK) 4 MG TBPK tablet Take 4 mg by mouth daily. 04/05/15   Historical Provider, MD    Family History  Problem Relation Age of Onset  . Heart attack Father   . Dementia Mother      History  Substance Use Topics  . Smoking status: Never Smoker   . Smokeless tobacco: Never Used  . Alcohol Use: No    Allergies as of 04/16/2015 - Review Complete 04/16/2015  Allergen Reaction Noted  . Codeine Other (See Comments) 04/13/2015  . Morphine  04/13/2015    Review of Systems:    All systems reviewed and negative except where noted in HPI.   Physical Exam:  BP 122/64 mmHg  Pulse 83  Temp(Src)  98.4 F (36.9 C)  Ht 5\' 6"  (1.676 m)  Wt 126 lb (57.153 kg)  BMI 20.35 kg/m2 No LMP recorded. Patient has had a hysterectomy. Psych:  Alert and cooperative. Normal mood and affect. General:   Alert,  Well-developed, well-nourished, pleasant and cooperative in NAD Head:  Normocephalic and atraumatic. Eyes:  Sclera clear, no icterus.   Conjunctiva pink. Ears:  Normal auditory acuity. Nose:  No deformity, discharge, or lesions. Mouth:  No deformity or lesions,oropharynx pink & moist. Neck:  Supple; no masses or thyromegaly. Lungs:  Respirations even and unlabored.  Clear throughout to auscultation.   No wheezes, crackles, or rhonchi. No acute distress. Heart:  Regular rate and rhythm; no murmurs, clicks, rubs, or  gallops. Abdomen:  Normal bowel sounds.  No bruits.  Soft, non-tender and non-distended without masses, hepatosplenomegaly or hernias noted.  No guarding or rebound tenderness.  Negative Carnett sign.   Rectal:  Deferred.  Msk:  Symmetrical without gross deformities.  Good, equal movement & strength bilaterally. Pulses:  Normal pulses noted. Extremities:  No clubbing or edema.  No cyanosis. Neurologic:  Alert and oriented x3;  grossly normal neurologically. Skin:  Intact without significant lesions or rashes.  No jaundice. Lymph Nodes:  No significant cervical adenopathy. Psych:  Alert and cooperative. Normal mood and affect.  Imaging Studies: Dg Bone Density  04/10/2015   EXAM: DUAL X-RAY ABSORPTIOMETRY (DXA) FOR BONE MINERAL DENSITY  IMPRESSION: Dear Dr. Caryn Section, Your patient Teresa [name] completed a BMD test on 04/10/2015 using the [place] (analysis version: 14.10) manufactured by [employer]. The following summarizes the results of our evaluation. PATIENT BIOGRAPHICAL: Name: Teresa, [name] Patient ID: [id number] Birth Date: 11-14-1955 Height: 65.0 in. Gender: Female Exam Date: 04/10/2015 Weight: 123.1 lbs. Indications: Caucasian, Family History of Fracture, Family Hx of Osteoporosis, Height Loss, History of Fracture (Adult), Hysterectomy, Oopherectomy Bilateral, Parent Hip Fracture, Postmenopausal, Vit D Defic Fractures: vertebrae Treatments: prednisone, Vitamin D  ASSESSMENT: The BMD measured at AP Spine L1-L2 is 0.755 g/cm2 with a T-score of -3.5. This patient is considered osteoporotic according to [facility]) criteria. L-3&4 was excluded due to degenerative changes. Site Region Measured Measured WHO Young Adult BMD Date       Age      Classification T-score AP Spine L1-L2 04/10/2015 60.8 Osteoporosis -3.5 0.755 g/cm2  DualFemur Total Right 04/10/2015 60.8 Osteoporosis -2.6 0.674 g/cm2  World Health Organization [facility]) criteria for post-menopausal, Caucasian  Women: Normal:       T-score at or above -1 SD Osteopenia:   T-score between -1 and -2.5 SD Osteoporosis: T-score at or below -2.5 SD  RECOMMENDATIONS: [place] recommends that FDA-approved medical therapies be considered in postmenopausal women and men age 3 or older with a: 1. Hip or vertebral (clinical or morphometric) fracture. 2. T-score of < -2.5 at the spine or hip. 3. Ten-year fracture probability by FRAX of 3% or greater for hip fracture or 20% or greater for major osteoporotic fracture. All treatment decisions require clinical judgment and consideration of individual patient factors, including patient preferences, co-morbidities, previous drug use, risk factors not captured in the FRAX model (e.g. falls, vitamin D deficiency, increased bone turnover, interval significant decline in bone density) and possible under - or over-estimation of fracture risk by FRAX.  All patients should ensure an adequate intake of dietary calcium (1200 mg/d) and vitamin D (800 IU daily) unless contraindicated. FOLLOW-UP: People with diagnosed cases of osteoporosis or at high risk for  fracture should have regular bone mineral density tests. For patients eligible for Medicare, routine testing is allowed once every 2 years. The testing frequency can be increased to one year for patients who have rapidly progressing disease, those who are receiving or discontinuing medical therapy to restore bone mass, or have additional risk factors.  I have reviewed this report, and agree with the above findings.  [facility] Radiology   Electronically Signed   By: David  Martinique M.D.   On: 04/10/2015 09:51

## 2015-04-16 NOTE — Assessment & Plan Note (Addendum)
This patient is a 59 year old woman who comes in with a history of chronic constipation who is on multiple laxitives on a daily basis to move her bowels. The patient also has bloating and abdominal pain that she thinks may be related to her history of adhesions in the past. The patient will be set up for a small bowel follow-through to look for possible narrowings adhesions. The patient is also due for a screening colonoscopy and will be set up for a screening colonoscopy. The patient will also be started on samples of Linzess. The patient has been explained the plan and agrees with it. I have discussed risks & benefits which include, but are not limited to, bleeding, infection, perforation & drug reaction.  The patient agrees with this plan & written consent will be obtained.

## 2015-05-02 ENCOUNTER — Encounter: Payer: Self-pay | Admitting: Anesthesiology

## 2015-05-02 ENCOUNTER — Encounter: Payer: Self-pay | Admitting: *Deleted

## 2015-05-11 ENCOUNTER — Ambulatory Visit
Admission: RE | Admit: 2015-05-11 | Payer: BLUE CROSS/BLUE SHIELD | Source: Ambulatory Visit | Admitting: Gastroenterology

## 2015-05-11 HISTORY — DX: Other specified disorders of bone density and structure, unspecified site: M85.80

## 2015-05-11 HISTORY — DX: Headache: R51

## 2015-05-11 HISTORY — DX: Procedure and treatment not carried out because of patient's decision for reasons of belief and group pressure: Z53.1

## 2015-05-11 HISTORY — DX: Personal history of (healed) traumatic fracture: Z87.81

## 2015-05-11 HISTORY — DX: Headache, unspecified: R51.9

## 2015-05-11 HISTORY — DX: Disorder of bone density and structure, unspecified: M85.9

## 2015-05-11 SURGERY — COLONOSCOPY WITH PROPOFOL
Anesthesia: Choice

## 2015-05-22 ENCOUNTER — Ambulatory Visit: Payer: BLUE CROSS/BLUE SHIELD

## 2015-06-01 ENCOUNTER — Telehealth: Payer: Self-pay | Admitting: Family Medicine

## 2015-06-01 NOTE — Telephone Encounter (Signed)
It was done as preventative, it was ordered because she has known history of osteoporosis and had a pathological fracture. The computer won't let me change the order

## 2015-06-01 NOTE — Telephone Encounter (Signed)
Per pt her diagnosis for her bone density needs to be coded as preventive.It was coded as well woman visit.I spoke with [place] in pt accounting at [employer] [phone]) and she stated that you can go into her chart.Look for order # [id number].You can change the code from here

## 2015-06-05 ENCOUNTER — Other Ambulatory Visit: Payer: Self-pay | Admitting: Family Medicine

## 2015-06-14 NOTE — Telephone Encounter (Signed)
LMTCB -for [place] at [employer]

## 2015-06-17 ENCOUNTER — Other Ambulatory Visit: Payer: Self-pay | Admitting: Family Medicine

## 2015-06-17 NOTE — Telephone Encounter (Signed)
Please call in the following medication. [id number] [phone]   Surescripts Out Interface  5 hours ago   (4:17 PM)     [id number] [phone]   Surescripts Out Interface  5 hours ago   (4:17 PM)         New medications from outside sources are available for reconciliation.       Requested Medications     Name from pharmacy:  In chart as:  ZOLPIDEM TARTRATE 10 MG TABLET zolpidem (AMBIEN) 10 MG tablet    Sig: TAKE 1/2 TO 1 TABLET BY MOUTH AT BEDTIME AS NEEDED    Dispense: 30 tablet  RF x 3            New medications from outside sources are available for reconciliation.       Requested Medications     Name from pharmacy:  In chart as:  ZOLPIDEM TARTRATE 10 MG TABLET zolpidem (AMBIEN) 10 MG tablet    Sig: TAKE 1/2 TO 1 TABLET BY MOUTH AT BEDTIME AS NEEDED    Dispense: 30 tablet

## 2015-06-18 NOTE — Telephone Encounter (Signed)
Rx called in to pharmacy. 

## 2015-10-10 ENCOUNTER — Other Ambulatory Visit: Payer: Self-pay | Admitting: *Deleted

## 2015-10-10 MED ORDER — ALENDRONATE SODIUM 70 MG PO TABS: ORAL_TABLET | ORAL | Status: DC

## 2015-10-10 NOTE — Telephone Encounter (Signed)
Requesting 90 day supply.

## 2015-11-12 ENCOUNTER — Other Ambulatory Visit: Payer: Self-pay | Admitting: Family Medicine

## 2015-11-13 NOTE — Telephone Encounter (Signed)
Rx called in to pharmacy. 

## 2015-11-13 NOTE — Telephone Encounter (Signed)
Please call in trazodone and zolpidem

## 2015-11-19 ENCOUNTER — Telehealth: Payer: Self-pay | Admitting: Family Medicine

## 2015-11-19 MED ORDER — NORTRIPTYLINE HCL 10 MG PO CAPS: ORAL_CAPSULE | ORAL | Status: DC

## 2015-11-19 NOTE — Telephone Encounter (Signed)
Please advise 

## 2015-11-19 NOTE — Telephone Encounter (Signed)
Pt contacted office for refill request on the following medications:  Nortriptyline HCI (pt is requesting this changed to 50mg  1 time a day if possible).  [facility].  [id number]

## 2015-12-13 ENCOUNTER — Encounter: Payer: Self-pay | Admitting: *Deleted

## 2015-12-17 NOTE — Discharge Instructions (Signed)

## 2015-12-18 ENCOUNTER — Ambulatory Visit: Payer: BLUE CROSS/BLUE SHIELD | Admitting: Anesthesiology

## 2015-12-18 ENCOUNTER — Ambulatory Visit
Admission: RE | Admit: 2015-12-18 | Discharge: 2015-12-18 | Disposition: A | Discharge status: Home / Self Care | Payer: BLUE CROSS/BLUE SHIELD | Source: Ambulatory Visit | Attending: Otolaryngology | Admitting: Otolaryngology

## 2015-12-18 ENCOUNTER — Encounter
Admission: RE | Disposition: A | Discharge status: Home / Self Care | Payer: Self-pay | Source: Ambulatory Visit | Attending: Otolaryngology

## 2015-12-18 DIAGNOSIS — Z79899 Other long term (current) drug therapy: Secondary | ICD-10-CM | POA: Insufficient documentation

## 2015-12-18 DIAGNOSIS — R51 Headache: Secondary | ICD-10-CM | POA: Diagnosis present

## 2015-12-18 HISTORY — PX: ARTERY BIOPSY: SHX891

## 2015-12-18 SURGERY — BIOPSY TEMPORAL ARTERY
Anesthesia: General | Laterality: Left | Wound class: Clean

## 2015-12-18 MED ORDER — LACTATED RINGERS IV SOLN
INTRAVENOUS | Status: DC
  Administered 2015-12-18: 08:00:00 via INTRAVENOUS

## 2015-12-18 MED ORDER — OXYCODONE HCL 5 MG PO TABS
5.0000 mg | ORAL_TABLET | Freq: Once | ORAL | Status: AC | PRN
Start: 2015-12-18 — End: 2015-12-18
  Administered 2015-12-18: 5 mg via ORAL

## 2015-12-18 MED ORDER — DOUBLE ANTIBIOTIC 500-10000 UNIT/GM EX OINT
TOPICAL_OINTMENT | CUTANEOUS | Status: DC | PRN
  Administered 2015-12-18: 1 via TOPICAL

## 2015-12-18 MED ORDER — LIDOCAINE HCL (CARDIAC) 20 MG/ML IV SOLN
INTRAVENOUS | Status: DC | PRN
  Administered 2015-12-18: 50 mg via INTRATRACHEAL

## 2015-12-18 MED ORDER — MIDAZOLAM HCL 5 MG/5ML IJ SOLN
INTRAMUSCULAR | Status: DC | PRN
  Administered 2015-12-18: 2 mg via INTRAVENOUS

## 2015-12-18 MED ORDER — LIDOCAINE-EPINEPHRINE 1 %-1:100000 IJ SOLN
INTRAMUSCULAR | Status: DC | PRN
  Administered 2015-12-18: 1 mL

## 2015-12-18 MED ORDER — FENTANYL CITRATE (PF) 100 MCG/2ML IJ SOLN
INTRAMUSCULAR | Status: DC | PRN
  Administered 2015-12-18: 100 ug via INTRAVENOUS

## 2015-12-18 MED ORDER — DEXAMETHASONE SODIUM PHOSPHATE 4 MG/ML IJ SOLN
INTRAMUSCULAR | Status: DC | PRN
  Administered 2015-12-18: 4 mg via INTRAVENOUS

## 2015-12-18 MED ORDER — GLYCOPYRROLATE 0.2 MG/ML IJ SOLN
INTRAMUSCULAR | Status: DC | PRN
  Administered 2015-12-18: 0.1 mg via INTRAVENOUS

## 2015-12-18 MED ORDER — PROPOFOL 10 MG/ML IV BOLUS
INTRAVENOUS | Status: DC | PRN
  Administered 2015-12-18: 150 mg via INTRAVENOUS

## 2015-12-18 MED ORDER — ONDANSETRON HCL 4 MG/2ML IJ SOLN
INTRAMUSCULAR | Status: DC | PRN
  Administered 2015-12-18: 4 mg via INTRAVENOUS

## 2015-12-18 MED ORDER — HYDROMORPHONE HCL 1 MG/ML IJ SOLN
0.2500 mg | INTRAMUSCULAR | Status: DC | PRN
  Administered 2015-12-18: 0.25 mg via INTRAVENOUS
  Administered 2015-12-18: 0.5 mg via INTRAVENOUS

## 2015-12-18 MED ORDER — MEPERIDINE HCL 25 MG/ML IJ SOLN: 6.2500 mg | INTRAMUSCULAR | Status: DC | PRN

## 2015-12-18 MED ORDER — TRAMADOL HCL 50 MG PO TABS: ORAL_TABLET | ORAL | Status: DC

## 2015-12-18 MED ORDER — PROMETHAZINE HCL 25 MG/ML IJ SOLN: 6.2500 mg | INTRAMUSCULAR | Status: DC | PRN

## 2015-12-18 MED ORDER — OXYCODONE HCL 5 MG/5ML PO SOLN: 5.0000 mg | Freq: Once | ORAL | Status: AC | PRN

## 2015-12-18 SURGICAL SUPPLY — 33 items
ADH SKN CLS APL DERMABOND .7 (GAUZE/BANDAGES/DRESSINGS)
BACTOSHIELD CHG 4% 4OZ (MISCELLANEOUS)
BLADE SURG 15 STRL LF DISP TIS (BLADE) IMPLANT
BLADE SURG 15 STRL SS (BLADE)
CANISTER SUCT 1200ML W/VALVE (MISCELLANEOUS) ×2 IMPLANT
CORD BIP STRL DISP 12FT (MISCELLANEOUS) ×2 IMPLANT
DERMABOND ADVANCED (GAUZE/BANDAGES/DRESSINGS)
DERMABOND ADVANCED .7 DNX12 (GAUZE/BANDAGES/DRESSINGS) IMPLANT
DRESSING TELFA 4X3 1S ST N-ADH (GAUZE/BANDAGES/DRESSINGS) ×2 IMPLANT
DRSG TEGADERM 2-3/8X2-3/4 SM (GAUZE/BANDAGES/DRESSINGS) IMPLANT
ELECT CAUTERY NDL 2.0 MIC (NEEDLE) IMPLANT
ELECT CAUTERY NEEDLE 2.0 MIC (NEEDLE) IMPLANT
GAUZE SPONGE 4X4 12PLY STRL (GAUZE/BANDAGES/DRESSINGS) IMPLANT
GLOVE BIO SURGEON STRL SZ7.5 (GLOVE) ×2 IMPLANT
KIT ROOM TURNOVER OR (KITS) ×2 IMPLANT
NS IRRIG 500ML POUR BTL (IV SOLUTION) ×2 IMPLANT
PACK DRAPE NASAL/ENT (PACKS) ×2 IMPLANT
PAD GROUND ADULT SPLIT (MISCELLANEOUS) IMPLANT
PENCIL ELECTRO HAND CTR (MISCELLANEOUS) IMPLANT
SCRUB CHG 4% DYNA-HEX 4OZ (MISCELLANEOUS) IMPLANT
SOL PREP PVP 2OZ (MISCELLANEOUS) ×2
SOLUTION PREP PVP 2OZ (MISCELLANEOUS) ×1 IMPLANT
SPONGE KITTNER 5P (MISCELLANEOUS) IMPLANT
STRAP BODY AND KNEE 60X3 (MISCELLANEOUS) ×2 IMPLANT
SUCTION FRAZIER TIP 10 FR DISP (SUCTIONS) ×2 IMPLANT
SUT PLAIN GUT FAST 5-0 (SUTURE) ×2 IMPLANT
SUT SILK 3 0 (SUTURE) ×2
SUT SILK 3-0 18XBRD TIE 12 (SUTURE) ×1 IMPLANT
SUT VIC AB 4-0 FS2 27 (SUTURE) ×2 IMPLANT
SUT VIC AB 4-0 RB1 18 (SUTURE) IMPLANT
SUT VIC AB 4-0 RB1 27 (SUTURE)
SUT VIC AB 4-0 RB1 27X BRD (SUTURE) IMPLANT
SYR BULB IRRIG 60ML STRL (SYRINGE) IMPLANT

## 2015-12-18 NOTE — Op Note (Signed)
12/18/2015  10:36 AM    Eloy End  [id number]   Pre-Op Diagnosis:  HEADACHE, POSSIBLE TEMPORAL ARTERITIS Post-op Diagnosis: HEADACHE, POSSIBLE TEMPORAL ARTERITIS  Procedure:   Left temporal artery biopsy  Surgeon:  Riley Nearing  Anesthesia:  General with laryngeal mask airway  EBL:  Minimal  Complications:  None  Findings: A 2 cm segment of the left temporal artery was harvested for pathology  Procedure: After the patient was identified in holding and the procedure was reviewed.  The patient was taken to the operating room and with the patient in a comfortable supine position,  general anesthesia with laryngeal mask airway ventilation was induced without difficulty.  A proper time-out was performed, confirming the operative site and procedure.  The left temple was palpated to map out the distribution of the left temporal artery, which was easily palpable. 1% lidocaine with epinephrine 1 100:000 was injected in the skin for better hemostasis. The left temporal area was then prepped and draped in the usual sterile fashion.  A 15 blade was used to incise the skin, carrying the incision down through the subcutaneous tissues. Tenotomies were then used to dissect to identify the temporal artery immediately below the incision. The artery was dissected out proximally and distally until a 2 cm segment of artery was identified. Either end of the artery was then tied off with 3-0 silk suture and the intervening segment excised and sent for pathology. The area was inspected for bleeding which was well controlled. The skin was then closed with 5-0 Vicryl suture for the deep closure and 5-0 fast absorbing gut suture in a running locked stitch for the skin closure. Bacitracin ointment was applied.  The patient was then returned to the anesthesiologist in good condition for awakening. The patient was awakened and taken to the recovery room in good condition.   Disposition:   PACU then  discharge home  Plan: Take pain medication as prescribed. Polysporin to wound twice daily for 5 days.  Riley Nearing 12/18/2015 10:36 AM

## 2015-12-18 NOTE — Anesthesia Procedure Notes (Signed)
Procedure Name: LMA Insertion Date/Time: 12/18/2015 9:43 AM Performed by: Mayme Genta Pre-anesthesia Checklist: Patient identified, Emergency Drugs available, Suction available, Timeout performed and Patient being monitored Patient Re-evaluated:Patient Re-evaluated prior to inductionOxygen Delivery Method: Circle system utilized Preoxygenation: Pre-oxygenation with 100% oxygen Intubation Type: IV induction LMA: LMA inserted LMA Size: 4.0 Number of attempts: 1 Placement Confirmation: positive ETCO2 and breath sounds checked- equal and bilateral Tube secured with: Tape

## 2015-12-18 NOTE — Anesthesia Preprocedure Evaluation (Signed)
Anesthesia Evaluation  Patient identified by MRN, date of birth, ID band  Reviewed: Allergy & Precautions, NPO status , Patient's Chart, lab work & pertinent test results, reviewed documented beta blocker date and time   Airway Mallampati: I  TM Distance: >3 FB Neck ROM: Full    Dental no notable dental hx.    Pulmonary neg pulmonary ROS,    Pulmonary exam normal        Cardiovascular hypertension, Normal cardiovascular exam     Neuro/Psych  Headaches, PSYCHIATRIC DISORDERS Depression    GI/Hepatic Neg liver ROS, GERD  Controlled,  Endo/Other  negative endocrine ROS  Renal/GU negative Renal ROS  negative genitourinary   Musculoskeletal negative musculoskeletal ROS (+)   Abdominal   Peds  Hematology negative hematology ROS (+)   Anesthesia Other Findings   Reproductive/Obstetrics                             Anesthesia Physical Anesthesia Plan  ASA: II  Anesthesia Plan: General   Post-op Pain Management:    Induction: Intravenous  Airway Management Planned: LMA  Additional Equipment:   Intra-op Plan:   Post-operative Plan:   Informed Consent: I have reviewed the patients History and Physical, chart, labs and discussed the procedure including the risks, benefits and alternatives for the proposed anesthesia with the patient or authorized representative who has indicated his/her understanding and acceptance.     Plan Discussed with: CRNA  Anesthesia Plan Comments:         Anesthesia Quick Evaluation

## 2015-12-18 NOTE — Transfer of Care (Signed)
Immediate Anesthesia Transfer of Care Note  Patient: Teresa [name]  Procedure(s) Performed: Procedure(s): BIOPSY TEMPORAL ARTERY (Left)  Patient Location: PACU  Anesthesia Type: General  Level of Consciousness: awake, alert  and patient cooperative  Airway and Oxygen Therapy: Patient Spontanous Breathing and Patient connected to supplemental oxygen  Post-op Assessment: Post-op Vital signs reviewed, Patient's Cardiovascular Status Stable, Respiratory Function Stable, Patent Airway and No signs of Nausea or vomiting  Post-op Vital Signs: Reviewed and stable  Complications: No apparent anesthesia complications

## 2015-12-18 NOTE — Anesthesia Postprocedure Evaluation (Signed)
Anesthesia Post Note  Patient: Teresa [name]  Procedure(s) Performed: Procedure(s) (LRB): BIOPSY TEMPORAL ARTERY (Left)  Patient location during evaluation: PACU Anesthesia Type: General Level of consciousness: awake and alert and oriented Pain management: pain level controlled Vital Signs Assessment: post-procedure vital signs reviewed and stable Respiratory status: spontaneous breathing and nonlabored ventilation Cardiovascular status: stable Postop Assessment: no signs of nausea or vomiting and adequate PO intake Anesthetic complications: no    Estill Batten

## 2015-12-18 NOTE — H&P (Signed)
History and physical reviewed and will be scanned in later. No change in medical status reported by the patient or family, appears stable for surgery. All questions regarding the procedure answered, and patient (or family if a child) expressed understanding of the procedure.  Teresa [name] S @TODAY@ 

## 2015-12-19 ENCOUNTER — Encounter: Payer: Self-pay | Admitting: Otolaryngology

## 2015-12-20 LAB — SURGICAL PATHOLOGY

## 2016-01-24 ENCOUNTER — Inpatient Hospital Stay: Payer: BLUE CROSS/BLUE SHIELD | Attending: Oncology | Admitting: Oncology

## 2016-01-24 VITALS — BP 125/63 | HR 80 | Temp 98.4°F | Resp 16 | Ht 66.73 in | Wt 134.0 lb

## 2016-01-24 DIAGNOSIS — R1113 Vomiting of fecal matter: Secondary | ICD-10-CM | POA: Diagnosis not present

## 2016-01-24 DIAGNOSIS — M8580 Other specified disorders of bone density and structure, unspecified site: Secondary | ICD-10-CM | POA: Diagnosis not present

## 2016-01-24 DIAGNOSIS — Z8701 Personal history of pneumonia (recurrent): Secondary | ICD-10-CM | POA: Diagnosis not present

## 2016-01-24 DIAGNOSIS — R5383 Other fatigue: Secondary | ICD-10-CM | POA: Diagnosis not present

## 2016-01-24 DIAGNOSIS — D509 Iron deficiency anemia, unspecified: Secondary | ICD-10-CM | POA: Diagnosis present

## 2016-01-24 DIAGNOSIS — R531 Weakness: Secondary | ICD-10-CM | POA: Insufficient documentation

## 2016-01-24 DIAGNOSIS — E7800 Pure hypercholesterolemia, unspecified: Secondary | ICD-10-CM

## 2016-01-24 DIAGNOSIS — Z79899 Other long term (current) drug therapy: Secondary | ICD-10-CM | POA: Insufficient documentation

## 2016-01-24 DIAGNOSIS — Z8669 Personal history of other diseases of the nervous system and sense organs: Secondary | ICD-10-CM | POA: Diagnosis not present

## 2016-01-24 DIAGNOSIS — E785 Hyperlipidemia, unspecified: Secondary | ICD-10-CM | POA: Insufficient documentation

## 2016-01-24 NOTE — Progress Notes (Signed)
In January patient had an episode of vomiting a dark brown substance then was lying on the bathroom floor with extreme dizziness and severe headache.  She did not seek medical attention after this episode but that is also when her symptoms of fatigue, SOBr, and palpitations with walking started.

## 2016-01-26 DIAGNOSIS — D509 Iron deficiency anemia, unspecified: Secondary | ICD-10-CM | POA: Diagnosis not present

## 2016-01-27 DIAGNOSIS — D509 Iron deficiency anemia, unspecified: Secondary | ICD-10-CM | POA: Diagnosis not present

## 2016-01-28 ENCOUNTER — Other Ambulatory Visit: Payer: Self-pay | Admitting: *Deleted

## 2016-01-28 DIAGNOSIS — D509 Iron deficiency anemia, unspecified: Secondary | ICD-10-CM

## 2016-01-28 LAB — OCCULT BLOOD X 1 CARD TO LAB, STOOL
Fecal Occult Bld: NEGATIVE
Fecal Occult Bld: NEGATIVE

## 2016-01-29 ENCOUNTER — Other Ambulatory Visit: Payer: Self-pay | Admitting: Orthopedic Surgery

## 2016-01-29 DIAGNOSIS — M5116 Intervertebral disc disorders with radiculopathy, lumbar region: Secondary | ICD-10-CM

## 2016-01-31 ENCOUNTER — Inpatient Hospital Stay: Payer: BLUE CROSS/BLUE SHIELD

## 2016-01-31 VITALS — BP 102/72 | HR 82 | Resp 18

## 2016-01-31 DIAGNOSIS — D509 Iron deficiency anemia, unspecified: Secondary | ICD-10-CM

## 2016-01-31 MED ORDER — FERUMOXYTOL INJECTION 510 MG/17 ML
510.0000 mg | Freq: Once | INTRAVENOUS | Status: AC
  Administered 2016-01-31: 510 mg via INTRAVENOUS
  Filled 2016-01-31: qty 17

## 2016-01-31 MED ORDER — SODIUM CHLORIDE 0.9 % IV SOLN
Freq: Once | INTRAVENOUS | Status: AC
  Administered 2016-01-31: 14:00:00 via INTRAVENOUS
  Filled 2016-01-31: qty 1000

## 2016-02-07 ENCOUNTER — Inpatient Hospital Stay: Payer: BLUE CROSS/BLUE SHIELD | Attending: Oncology

## 2016-02-07 VITALS — BP 127/79 | HR 70 | Temp 96.0°F | Resp 20

## 2016-02-07 DIAGNOSIS — Z79899 Other long term (current) drug therapy: Secondary | ICD-10-CM | POA: Insufficient documentation

## 2016-02-07 DIAGNOSIS — R531 Weakness: Secondary | ICD-10-CM | POA: Insufficient documentation

## 2016-02-07 DIAGNOSIS — D509 Iron deficiency anemia, unspecified: Secondary | ICD-10-CM

## 2016-02-07 DIAGNOSIS — G43909 Migraine, unspecified, not intractable, without status migrainosus: Secondary | ICD-10-CM | POA: Diagnosis not present

## 2016-02-07 DIAGNOSIS — E785 Hyperlipidemia, unspecified: Secondary | ICD-10-CM | POA: Diagnosis not present

## 2016-02-07 MED ORDER — SODIUM CHLORIDE 0.9 % IV SOLN
510.0000 mg | Freq: Once | INTRAVENOUS | Status: AC
  Administered 2016-02-07: 510 mg via INTRAVENOUS
  Filled 2016-02-07: qty 17

## 2016-02-07 MED ORDER — SODIUM CHLORIDE 0.9 % IV SOLN
Freq: Once | INTRAVENOUS | Status: AC
  Administered 2016-02-07: 14:00:00 via INTRAVENOUS
  Filled 2016-02-07: qty 1000

## 2016-02-08 NOTE — Progress Notes (Signed)
[facility]  [phone] [phone]) [phone]  ID: Eloy End OB: Jul 09, 1956  MR#: [id number]  [id number]  Patient Care Team: Tracie Harrier, MD as PCP - General (Internal Medicine) Robert Bellow, MD (General Surgery) Kennieth Francois, MD (Dermatology) Birdie Sons, MD as Referring Physician (Family Medicine)  CHIEF COMPLAINT:  Chief Complaint  Patient presents with  . New Evaluation    INTERVAL HISTORY: Patient is a 60 year old female who had an episode of vomiting a dark brown substance associated with extreme dizziness and a severe headache back in January 2017. She did not seek medical attention at that time. Over the past several months she has noted increasing weakness and fatigue as well as shortness of breath. Subsequent, patient was found to have a hemoglobin of 8.4 with decreased iron stores. Currently, she has no neurological point. She denies any recent fevers. She has a good appetite and denies weight loss. She has no chest pain or any further shortness of breath. She denies any nausea, vomiting, constipation, or diarrhea. She has no melena or hematochezia. She has no urinary complaints. Patient otherwise feels well and offers no further specific complaints.  REVIEW OF SYSTEMS:   Review of Systems  Constitutional: Positive for malaise/fatigue. Negative for fever and weight loss.  Respiratory: Negative.   Cardiovascular: Negative.  Negative for chest pain.  Gastrointestinal: Negative.  Negative for blood in stool and melena.  Genitourinary: Negative.  Negative for hematuria.  Musculoskeletal: Negative.   Neurological: Positive for weakness.    As per HPI. Otherwise, a complete review of systems is negatve.  PAST MEDICAL HISTORY: Past Medical History  Diagnosis Date  . Hyperlipidemia   . History of fractured vertebra   . Low bone density   . Transfusion of blood product refused for religious reason   . Headache    migraines    PAST SURGICAL HISTORY: Past Surgical History  Procedure Laterality Date  . Abdominal hysterectomy    . Colonoscopy  2000  . Eye surgery Left 2014  . Artery biopsy Left 12/18/2015    Procedure: BIOPSY TEMPORAL ARTERY;  Surgeon: Clyde Canterbury, MD;  Location: [facility];  Service: ENT;  Laterality: Left;    FAMILY HISTORY Family History  Problem Relation Age of Onset  . Heart attack Father   . Dementia Mother        ADVANCED DIRECTIVES:    HEALTH MAINTENANCE: Social History  Substance Use Topics  . Smoking status: Never Smoker   . Smokeless tobacco: Never Used  . Alcohol Use: No     Colonoscopy:  PAP:  Bone density:  Lipid panel:  Allergies  Allergen Reactions  . Codeine Other (See Comments)    Pt has had Codeine recently without problems  . Morphine Rash     Pt thinks she had rash after extended use    Current Outpatient Prescriptions  Medication Sig Dispense Refill  . alendronate (FOSAMAX) 70 MG tablet 1 tablet weekly in the morning 30 minutes before eating. Take with a full glass of water on an empty stomach. 12 tablet 3  . CALCIUM PO Take by mouth daily.    . Cholecalciferol (VITAMIN D-3 PO) Take by mouth daily.    . ferrous gluconate (FERGON) 240 (27 FE) MG tablet Take by mouth.    Marland Kitchen FLECTOR 1.3 % PTCH PLACE 1 PATCH ONTO THE SKIN 2 (TWO) TIMES DAILY. APPLY PATCH TO THE MOST PAINFUL AREA.  11  . magnesium oxide (MAG-OX) 400 MG  tablet Take 400 mg by mouth 2 (two) times daily.    . nortriptyline (PAMELOR) 50 MG capsule TAKE 1 CAPSULE (50 MG TOTAL) BY MOUTH NIGHTLY. PATIENT NEEDS APPOINTMENT  0  . propranolol (INDERAL) 10 MG tablet Take by mouth.    . topiramate (TOPAMAX) 25 MG capsule Take 25 mg by mouth 4 (four) times daily.     . traMADol (ULTRAM) 50 MG tablet 1-2 tablets every 4-6 hours as needed for pain 30 tablet 0  . traZODone (DESYREL) 100 MG tablet TAKE 1 TABLET BY MOUTH AT BEDTIME AS NEEDED 30 tablet 5  . zolpidem (AMBIEN) 10 MG  tablet TAKE 1/2 TO 1 TABLET BY MOUTH EVERY DAY AT BEDTIME AS NEEDED 30 tablet 2   No current facility-administered medications for this visit.    OBJECTIVE: Filed Vitals:   01/24/16 1611  BP: 125/63  Pulse: 80  Temp: 98.4 F (36.9 C)  Resp: 16     Body mass index is 21.16 kg/(m^2).    ECOG FS:0 - Asymptomatic  General: Well-developed, well-nourished, no acute distress. Eyes: Pink conjunctiva, anicteric sclera. HEENT: Normocephalic, moist mucous membranes, clear oropharnyx. Lungs: Clear to auscultation bilaterally. Heart: Regular rate and rhythm. No rubs, murmurs, or gallops. Abdomen: Soft, nontender, nondistended. No organomegaly noted, normoactive bowel sounds. Musculoskeletal: No edema, cyanosis, or clubbing. Neuro: Alert, answering all questions appropriately. Cranial nerves grossly intact. Skin: No rashes or petechiae noted. Psych: Normal affect. Lymphatics: No cervical, calvicular, axillary or inguinal LAD.   LAB RESULTS:  Lab Results  Component Value Date   NA 139 08/14/2014   K 4.0 08/14/2014   CL 99 08/14/2014   CO2 26 08/14/2014   GLUCOSE 94 08/14/2014   BUN 11 08/14/2014   CREATININE 0.77 08/14/2014   CALCIUM 10.2 08/14/2014   GFRNONAA 89* 08/14/2014   GFRAA >90 08/14/2014    Lab Results  Component Value Date   WBC 8.8 08/14/2014   NEUTROABS 5.6 08/14/2014   HGB 13.3 08/14/2014   HCT 39.6 08/14/2014   MCV 83.2 08/14/2014   PLT 304 08/14/2014     STUDIES: No results found.  ASSESSMENT: Iron deficiency anemia, possibly GI source.  PLAN:    1. Iron deficiency anemia: Patient was recently found to have a hemoglobin of 8.4 and a ferritin of less than 5. She is also symptomatic with weakness and fatigue. Stool cards ordered and patient was found to be heme-negative. Will proceed with 510 mg IV Feraheme and then return to clinic in 1 week for a second infusion. Patient will then return to clinic in 3 months with repeat laboratory work and further  evaluation. At that time we will draw additional labs to assess if there are other etiologies of her anemia. It was also recommended that patient have a full GI workup.  Patient expressed understanding and was in agreement with this plan. She also understands that She can call clinic at any time with any questions, concerns, or complaints.    Lloyd Huger, MD   02/08/2016 3:12 PM

## 2016-02-12 ENCOUNTER — Other Ambulatory Visit: Payer: Self-pay | Admitting: Internal Medicine

## 2016-02-12 DIAGNOSIS — R519 Headache, unspecified: Secondary | ICD-10-CM

## 2016-02-12 DIAGNOSIS — R51 Headache: Principal | ICD-10-CM

## 2016-02-12 DIAGNOSIS — G479 Sleep disorder, unspecified: Secondary | ICD-10-CM

## 2016-02-19 ENCOUNTER — Telehealth: Payer: Self-pay | Admitting: *Deleted

## 2016-02-19 DIAGNOSIS — D509 Iron deficiency anemia, unspecified: Secondary | ICD-10-CM

## 2016-02-19 NOTE — Telephone Encounter (Signed)
Per Dr Grayland Ormond, come in next week for CBC, IIBC, Ferr and sched for [facility]. Pt agrees to come for lab on 4/25 and MD / infusio on 4/26

## 2016-02-19 NOTE — Telephone Encounter (Signed)
States she and Dr Ginette Pitman feels she needs an iron infusion soon and that she does not need to wait until June to get it. Her Ferritin is low and she is feeling weak. Last Ferritin drawn was 01/15/16 and was 5

## 2016-02-25 ENCOUNTER — Inpatient Hospital Stay: Payer: BLUE CROSS/BLUE SHIELD

## 2016-02-25 ENCOUNTER — Ambulatory Visit
Admission: RE | Admit: 2016-02-25 | Discharge: 2016-02-25 | Disposition: A | Discharge status: Home / Self Care | Payer: BLUE CROSS/BLUE SHIELD | Source: Ambulatory Visit | Attending: Orthopedic Surgery | Admitting: Orthopedic Surgery

## 2016-02-25 DIAGNOSIS — D509 Iron deficiency anemia, unspecified: Secondary | ICD-10-CM

## 2016-02-25 DIAGNOSIS — M5126 Other intervertebral disc displacement, lumbar region: Secondary | ICD-10-CM | POA: Insufficient documentation

## 2016-02-25 DIAGNOSIS — M5116 Intervertebral disc disorders with radiculopathy, lumbar region: Secondary | ICD-10-CM | POA: Diagnosis present

## 2016-02-25 LAB — CBC WITH DIFFERENTIAL/PLATELET
Basophils Absolute: 0 10*3/uL (ref 0–0.1)
Basophils Relative: 1 %
Eosinophils Absolute: 0.1 10*3/uL (ref 0–0.7)
Eosinophils Relative: 3 %
HCT: 36.4 % (ref 35.0–47.0)
Hemoglobin: 11.8 g/dL — ABNORMAL LOW (ref 12.0–16.0)
Lymphocytes Relative: 31 %
Lymphs Abs: 1.7 10*3/uL (ref 1.0–3.6)
MCH: 26.1 pg (ref 26.0–34.0)
MCHC: 32.3 g/dL (ref 32.0–36.0)
MCV: 80.9 fL (ref 80.0–100.0)
Monocytes Absolute: 0.7 10*3/uL (ref 0.2–0.9)
Monocytes Relative: 14 %
Neutro Abs: 2.8 10*3/uL (ref 1.4–6.5)
Neutrophils Relative %: 51 %
Platelets: 254 10*3/uL (ref 150–440)
RBC: 4.5 MIL/uL (ref 3.80–5.20)
RDW: 21.5 % — ABNORMAL HIGH (ref 11.5–14.5)
WBC: 5.3 10*3/uL (ref 3.6–11.0)

## 2016-02-25 LAB — IRON AND TIBC
Iron: 103 ug/dL (ref 28–170)
Saturation Ratios: 35 % — ABNORMAL HIGH (ref 10.4–31.8)
TIBC: 293 ug/dL (ref 250–450)
UIBC: 190 ug/dL

## 2016-02-25 LAB — FERRITIN: Ferritin: 404 ng/mL — ABNORMAL HIGH (ref 11–307)

## 2016-02-26 ENCOUNTER — Inpatient Hospital Stay: Payer: BLUE CROSS/BLUE SHIELD

## 2016-02-27 ENCOUNTER — Inpatient Hospital Stay: Payer: BLUE CROSS/BLUE SHIELD

## 2016-02-27 ENCOUNTER — Inpatient Hospital Stay (HOSPITAL_BASED_OUTPATIENT_CLINIC_OR_DEPARTMENT_OTHER): Payer: BLUE CROSS/BLUE SHIELD | Admitting: Oncology

## 2016-02-27 VITALS — BP 123/76 | HR 72 | Temp 97.7°F | Resp 14 | Wt 131.0 lb

## 2016-02-27 DIAGNOSIS — R531 Weakness: Secondary | ICD-10-CM

## 2016-02-27 DIAGNOSIS — G43909 Migraine, unspecified, not intractable, without status migrainosus: Secondary | ICD-10-CM

## 2016-02-27 DIAGNOSIS — Z79899 Other long term (current) drug therapy: Secondary | ICD-10-CM

## 2016-02-27 DIAGNOSIS — D509 Iron deficiency anemia, unspecified: Secondary | ICD-10-CM | POA: Diagnosis not present

## 2016-02-27 NOTE — Progress Notes (Signed)
Patient still having fatigue and weakness that did not seem to improve after iron infusion.

## 2016-03-04 ENCOUNTER — Ambulatory Visit
Admission: RE | Admit: 2016-03-04 | Discharge: 2016-03-04 | Disposition: A | Discharge status: Home / Self Care | Payer: BLUE CROSS/BLUE SHIELD | Source: Ambulatory Visit | Attending: Internal Medicine | Admitting: Internal Medicine

## 2016-03-04 DIAGNOSIS — R700 Elevated erythrocyte sedimentation rate: Secondary | ICD-10-CM | POA: Diagnosis not present

## 2016-03-04 DIAGNOSIS — G479 Sleep disorder, unspecified: Secondary | ICD-10-CM

## 2016-03-04 DIAGNOSIS — G8929 Other chronic pain: Secondary | ICD-10-CM | POA: Insufficient documentation

## 2016-03-04 DIAGNOSIS — R519 Headache, unspecified: Secondary | ICD-10-CM

## 2016-03-04 DIAGNOSIS — R51 Headache: Secondary | ICD-10-CM | POA: Insufficient documentation

## 2016-03-04 LAB — POCT I-STAT CREATININE: Creatinine, Ser: 1.1 mg/dL — ABNORMAL HIGH (ref 0.44–1.00)

## 2016-03-04 MED ORDER — GADOBENATE DIMEGLUMINE 529 MG/ML IV SOLN
15.0000 mL | Freq: Once | INTRAVENOUS | Status: AC | PRN
  Administered 2016-03-04: 12 mL via INTRAVENOUS

## 2016-03-04 NOTE — Progress Notes (Signed)
[place]  Telephone:([phone]) [phone] [phone]) [phone]  ID: Eloy End OB: April 30, 1956  MR#: [id number]  [id number]  Patient Care Team: Tracie Harrier, MD as PCP - General (Internal Medicine) Robert Bellow, MD (General Surgery) Kennieth Francois, MD (Dermatology) Birdie Sons, MD as Referring Physician (Family Medicine)  CHIEF COMPLAINT:  Chief Complaint  Patient presents with  . Anemia    INTERVAL HISTORY: Patient returns to clinic today as an add-on with complaints of persistent weakness and fatigue despite receiving 2 doses of IV Feraheme approximately one month ago. She has no neurologic complaints. She denies any recent fevers. She has a good appetite and denies weight loss. She has no chest pain or shortness of breath. She denies any nausea, vomiting, constipation, or diarrhea. She has no melena or hematochezia. She has no urinary complaints. Patient offers no further specific complaints.  REVIEW OF SYSTEMS:   Review of Systems  Constitutional: Positive for malaise/fatigue. Negative for fever and weight loss.  Respiratory: Negative.   Cardiovascular: Negative.  Negative for chest pain.  Gastrointestinal: Negative.  Negative for blood in stool and melena.  Genitourinary: Negative.  Negative for hematuria.  Musculoskeletal: Negative.   Neurological: Negative.   Psychiatric/Behavioral: Negative.     As per HPI. Otherwise, a complete review of systems is negatve.  PAST MEDICAL HISTORY: Past Medical History  Diagnosis Date  . Hyperlipidemia   . History of fractured vertebra   . Low bone density   . Transfusion of blood product refused for religious reason   . Headache     migraines    PAST SURGICAL HISTORY: Past Surgical History  Procedure Laterality Date  . Abdominal hysterectomy    . Colonoscopy  2000  . Eye surgery Left 2014  . Artery biopsy Left 12/18/2015    Procedure: BIOPSY TEMPORAL ARTERY;  Surgeon: Clyde Canterbury, MD;   Location: [place];  Service: ENT;  Laterality: Left;    FAMILY HISTORY Family History  Problem Relation Age of Onset  . Heart attack Father   . Dementia Mother        ADVANCED DIRECTIVES:    HEALTH MAINTENANCE: Social History  Substance Use Topics  . Smoking status: Never Smoker   . Smokeless tobacco: Never Used  . Alcohol Use: No     Colonoscopy:  PAP:  Bone density:  Lipid panel:  Allergies  Allergen Reactions  . Codeine Other (See Comments)    Pt has had Codeine recently without problems  . Morphine Rash     Pt thinks she had rash after extended use    Current Outpatient Prescriptions  Medication Sig Dispense Refill  . alendronate (FOSAMAX) 70 MG tablet 1 tablet weekly in the morning 30 minutes before eating. Take with a full glass of water on an empty stomach. 12 tablet 3  . CALCIUM PO Take by mouth daily.    . Cholecalciferol (VITAMIN D-3 PO) Take by mouth daily.    . ferrous gluconate (FERGON) 240 (27 FE) MG tablet Take by mouth.    Marland Kitchen FLECTOR 1.3 % PTCH PLACE 1 PATCH ONTO THE SKIN 2 (TWO) TIMES DAILY. APPLY PATCH TO THE MOST PAINFUL AREA.  11  . magnesium oxide (MAG-OX) 400 MG tablet Take 400 mg by mouth 2 (two) times daily.    . nortriptyline (PAMELOR) 50 MG capsule TAKE 1 CAPSULE (50 MG TOTAL) BY MOUTH NIGHTLY. PATIENT NEEDS APPOINTMENT  0  . propranolol (INDERAL) 10 MG tablet Take by mouth.    Marland Kitchen  topiramate (TOPAMAX) 25 MG capsule Take 25 mg by mouth 4 (four) times daily.     . traMADol (ULTRAM) 50 MG tablet 1-2 tablets every 4-6 hours as needed for pain 30 tablet 0  . traZODone (DESYREL) 100 MG tablet TAKE 1 TABLET BY MOUTH AT BEDTIME AS NEEDED 30 tablet 5  . zolpidem (AMBIEN) 10 MG tablet TAKE 1/2 TO 1 TABLET BY MOUTH EVERY DAY AT BEDTIME AS NEEDED 30 tablet 2   No current facility-administered medications for this visit.    OBJECTIVE: Filed Vitals:   02/27/16 0855  BP: 123/76  Pulse: 72  Temp: 97.7 F (36.5 C)  Resp: 14     Body mass  index is 20.68 kg/(m^2).    ECOG FS:0 - Asymptomatic  General: Well-developed, well-nourished, no acute distress. Eyes: Pink conjunctiva, anicteric sclera. Lungs: Clear to auscultation bilaterally. Heart: Regular rate and rhythm. No rubs, murmurs, or gallops. Abdomen: Soft, nontender, nondistended. No organomegaly noted, normoactive bowel sounds. Musculoskeletal: No edema, cyanosis, or clubbing. Neuro: Alert, answering all questions appropriately. Cranial nerves grossly intact. Skin: No rashes or petechiae noted. Psych: Normal affect.   LAB RESULTS:  Lab Results  Component Value Date   NA 139 08/14/2014   K 4.0 08/14/2014   CL 99 08/14/2014   CO2 26 08/14/2014   GLUCOSE 94 08/14/2014   BUN 11 08/14/2014   CREATININE 1.10* 03-30-2016   CALCIUM 10.2 08/14/2014   GFRNONAA 89* 08/14/2014   GFRAA >90 08/14/2014    Lab Results  Component Value Date   WBC 5.3 02/25/2016   NEUTROABS 2.8 02/25/2016   HGB 11.8* 02/25/2016   HCT 36.4 02/25/2016   MCV 80.9 02/25/2016   PLT 254 02/25/2016   Lab Results  Component Value Date   IRON 103 02/25/2016   TIBC 293 02/25/2016   IRONPCTSAT 35* 02/25/2016    Lab Results  Component Value Date   FERRITIN 404* 02/25/2016     STUDIES: Mr Jeri Cos Wo Contrast  Mar 30, 2016  CLINICAL DATA:  Pulsatile migraine headaches centered in the left temporal region over the last 3 years. Sleep disturbance. Non intractable headache. EXAM: MRI HEAD WITHOUT AND WITH CONTRAST TECHNIQUE: Multiplanar, multiecho pulse sequences of the brain and surrounding structures were obtained without and with intravenous contrast. CONTRAST:  80mL MULTIHANCE GADOBENATE DIMEGLUMINE 529 MG/ML IV SOLN COMPARISON:  CT of the head 08/14/2014. FINDINGS: No acute infarct, hemorrhage, or mass lesion is present. The ventricles are of normal size. No significant extraaxial fluid collection is present. No significant white matter disease is present. The internal auditory canals are  within normal limits bilaterally. The brainstem and cerebellum are normal. Flow is present in the major intracranial arteries. A left lens replacement is noted. The right globe is intact. The orbits are within normal limits bilaterally. The paranasal sinuses and mastoid air cells are clear. The skullbase is within normal limits. Midline sagittal images are unremarkable. 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 No pathologic enhancement is present. IMPRESSION: Normal MRI appearance of the brain. No acute or focal lesion to explain pulsatile migraine headaches. Electronically Signed   By: San Morelle M.D.   On: Mar 30, 2016 12:21   Mr Lumbar Spine Wo Contrast  02/25/2016  CLINICAL DATA:  Intervertebral disc disorder lumbar region. Radiculopathy. EXAM: MRI LUMBAR SPINE WITHOUT CONTRAST TECHNIQUE: Multiplanar, multisequence MR imaging of the lumbar spine was performed. No intravenous contrast was administered. COMPARISON:  Lumbar MRI 03/18/2007 FINDINGS: S1 is a partially  lumbarized vertebra. This places the tip of the conus medullaris at L1-2. The conus appears normal. Normal lumbar alignment.  Negative for fracture or mass lesion. L1-2:  Negative L2-3:  Mild disc degeneration without spinal stenosis L3-4: Mild disc and mild facet degeneration without spinal or foraminal stenosis L4-5: Mild bulging of the disc and mild facet degeneration. Mild narrowing of the canal without significant spinal stenosis. No change from the prior study. L5-S1: Diffuse bulging of the disc without focal disc protrusion. Disc bulging is similar to the prior study. Negative for spinal or foraminal stenosis. No nerve root compression. IMPRESSION: Mild disc bulging L4-5 and L5-S1 without significant spinal stenosis. No change from 2008. Electronically Signed   By: Franchot Gallo M.D.   On: 02/25/2016 09:41    ASSESSMENT: Iron deficiency anemia, possibly GI  source.  PLAN:    1. Iron deficiency anemia: Patient's hemoglobin has improved to 11.8 and her iron stores are within normal limits. It is unlikely her persistent weakness and fatigue is related to ongoing anemia or iron deficiency. She does not require additional Feraheme at this time. She has been instructed to keep her previously scheduled follow-up in June 2017. It was also recommended that patient have a full GI workup. 2. Weakness and fatigue: Unclear etiology. Continue workup by primary care physician. 3. Migraine headaches: MRI reviewed independently and reported as above with no obvious intracranial lesion.  Patient expressed understanding and was in agreement with this plan. She also understands that She can call clinic at any time with any questions, concerns, or complaints.    Lloyd Huger, MD   03/04/2016 1:02 PM

## 2016-04-24 ENCOUNTER — Other Ambulatory Visit: Payer: BLUE CROSS/BLUE SHIELD

## 2016-04-28 ENCOUNTER — Ambulatory Visit: Payer: BLUE CROSS/BLUE SHIELD

## 2016-04-28 ENCOUNTER — Other Ambulatory Visit: Payer: BLUE CROSS/BLUE SHIELD

## 2016-04-28 ENCOUNTER — Ambulatory Visit: Payer: BLUE CROSS/BLUE SHIELD | Admitting: Oncology

## 2016-06-05 ENCOUNTER — Other Ambulatory Visit: Payer: Self-pay | Admitting: Family Medicine

## 2016-12-18 ENCOUNTER — Other Ambulatory Visit: Payer: Self-pay | Admitting: Family Medicine

## 2018-02-10 ENCOUNTER — Other Ambulatory Visit: Payer: Self-pay | Admitting: Internal Medicine

## 2018-02-10 DIAGNOSIS — M5412 Radiculopathy, cervical region: Secondary | ICD-10-CM

## 2018-02-10 DIAGNOSIS — M549 Dorsalgia, unspecified: Secondary | ICD-10-CM

## 2018-02-13 ENCOUNTER — Ambulatory Visit: Payer: BLUE CROSS/BLUE SHIELD

## 2018-02-17 ENCOUNTER — Ambulatory Visit: Payer: BLUE CROSS/BLUE SHIELD

## 2018-02-25 ENCOUNTER — Ambulatory Visit: Payer: BLUE CROSS/BLUE SHIELD

## 2018-02-25 ENCOUNTER — Other Ambulatory Visit: Payer: Self-pay | Admitting: Internal Medicine

## 2018-02-25 ENCOUNTER — Ambulatory Visit
Admission: RE | Admit: 2018-02-25 | Discharge: 2018-02-25 | Disposition: A | Discharge status: Home / Self Care | Payer: BLUE CROSS/BLUE SHIELD | Source: Ambulatory Visit | Attending: Internal Medicine | Admitting: Internal Medicine

## 2018-02-25 DIAGNOSIS — M792 Neuralgia and neuritis, unspecified: Secondary | ICD-10-CM | POA: Insufficient documentation

## 2018-02-25 DIAGNOSIS — M549 Dorsalgia, unspecified: Secondary | ICD-10-CM

## 2018-02-25 DIAGNOSIS — R937 Abnormal findings on diagnostic imaging of other parts of musculoskeletal system: Secondary | ICD-10-CM | POA: Insufficient documentation

## 2018-02-25 DIAGNOSIS — M5412 Radiculopathy, cervical region: Secondary | ICD-10-CM

## 2018-02-25 MED ORDER — GADOBENATE DIMEGLUMINE 529 MG/ML IV SOLN: 12.0000 mL | Freq: Once | INTRAVENOUS | Status: DC | PRN

## 2018-03-02 LAB — POCT I-STAT CREATININE: Creatinine, Ser: 1.1 mg/dL — ABNORMAL HIGH (ref 0.44–1.00)

## 2018-06-21 ENCOUNTER — Other Ambulatory Visit: Payer: Self-pay | Admitting: Otolaryngology

## 2018-06-21 DIAGNOSIS — R221 Localized swelling, mass and lump, neck: Secondary | ICD-10-CM

## 2018-06-23 ENCOUNTER — Ambulatory Visit
Admission: RE | Admit: 2018-06-23 | Discharge: 2018-06-23 | Disposition: A | Discharge status: Home / Self Care | Payer: BLUE CROSS/BLUE SHIELD | Source: Ambulatory Visit | Attending: Otolaryngology | Admitting: Otolaryngology

## 2018-06-23 DIAGNOSIS — R221 Localized swelling, mass and lump, neck: Secondary | ICD-10-CM | POA: Diagnosis not present

## 2018-06-23 MED ORDER — IOPAMIDOL (ISOVUE-300) INJECTION 61%
75.0000 mL | Freq: Once | INTRAVENOUS | Status: AC | PRN
  Administered 2018-06-23: 75 mL via INTRAVENOUS

## 2018-10-21 ENCOUNTER — Other Ambulatory Visit: Payer: Self-pay | Admitting: Internal Medicine

## 2018-10-21 DIAGNOSIS — Z1231 Encounter for screening mammogram for malignant neoplasm of breast: Secondary | ICD-10-CM

## 2018-11-15 ENCOUNTER — Other Ambulatory Visit: Payer: Self-pay | Admitting: Physical Medicine and Rehabilitation

## 2018-11-15 DIAGNOSIS — M5416 Radiculopathy, lumbar region: Secondary | ICD-10-CM

## 2018-11-26 ENCOUNTER — Ambulatory Visit: Payer: PRIVATE HEALTH INSURANCE

## 2018-11-30 ENCOUNTER — Ambulatory Visit: Payer: PRIVATE HEALTH INSURANCE

## 2018-12-01 ENCOUNTER — Ambulatory Visit: Payer: PRIVATE HEALTH INSURANCE

## 2018-12-01 ENCOUNTER — Ambulatory Visit
Admission: RE | Admit: 2018-12-01 | Discharge: 2018-12-01 | Disposition: A | Discharge status: Home / Self Care | Payer: PRIVATE HEALTH INSURANCE | Source: Ambulatory Visit | Attending: Physical Medicine and Rehabilitation | Admitting: Physical Medicine and Rehabilitation

## 2018-12-01 DIAGNOSIS — M5416 Radiculopathy, lumbar region: Secondary | ICD-10-CM | POA: Insufficient documentation

## 2018-12-02 ENCOUNTER — Ambulatory Visit: Payer: BLUE CROSS/BLUE SHIELD | Admitting: Student in an Organized Health Care Education/Training Program

## 2018-12-16 ENCOUNTER — Other Ambulatory Visit: Payer: Self-pay

## 2018-12-16 ENCOUNTER — Encounter: Payer: Self-pay | Admitting: Student in an Organized Health Care Education/Training Program

## 2018-12-16 ENCOUNTER — Ambulatory Visit
Payer: PRIVATE HEALTH INSURANCE | Attending: Student in an Organized Health Care Education/Training Program | Admitting: Student in an Organized Health Care Education/Training Program

## 2018-12-16 VITALS — BP 123/57 | HR 89 | Temp 97.8°F | Resp 14 | Ht 66.0 in | Wt 130.0 lb

## 2018-12-16 DIAGNOSIS — G894 Chronic pain syndrome: Secondary | ICD-10-CM | POA: Diagnosis not present

## 2018-12-16 DIAGNOSIS — M47816 Spondylosis without myelopathy or radiculopathy, lumbar region: Secondary | ICD-10-CM | POA: Diagnosis present

## 2018-12-16 DIAGNOSIS — M5416 Radiculopathy, lumbar region: Secondary | ICD-10-CM | POA: Insufficient documentation

## 2018-12-16 DIAGNOSIS — M5137 Other intervertebral disc degeneration, lumbosacral region: Secondary | ICD-10-CM | POA: Insufficient documentation

## 2018-12-16 MED ORDER — DICLOFENAC SODIUM 75 MG PO TBEC: 75.0000 mg | DELAYED_RELEASE_TABLET | Freq: Two times a day (BID) | ORAL | 1 refills | Status: DC

## 2018-12-16 NOTE — Patient Instructions (Signed)
GENERAL RISKS AND COMPLICATIONS  What are the risk, side effects and possible complications? Generally speaking, most procedures are safe.  However, with any procedure there are risks, side effects, and the possibility of complications.  The risks and complications are dependent upon the sites that are lesioned, or the type of nerve block to be performed.  The closer the procedure is to the spine, the more serious the risks are.  Great care is taken when placing the radio frequency needles, block needles or lesioning probes, but sometimes complications can occur. 1. Infection: Any time there is an injection through the skin, there is a risk of infection.  This is why sterile conditions are used for these blocks.  There are four possible types of infection. 1. Localized skin infection. 2. Central Nervous System Infection-This can be in the form of Meningitis, which can be deadly. 3. Epidural Infections-This can be in the form of an epidural abscess, which can cause pressure inside of the spine, causing compression of the spinal cord with subsequent paralysis. This would require an emergency surgery to decompress, and there are no guarantees that the patient would recover from the paralysis. 4. Discitis-This is an infection of the intervertebral discs.  It occurs in about 1% of discography procedures.  It is difficult to treat and it may lead to surgery.        2. Pain: the needles have to go through skin and soft tissues, will cause soreness.       3. Damage to internal structures:  The nerves to be lesioned may be near blood vessels or    other nerves which can be potentially damaged.       4. Bleeding: Bleeding is more common if the patient is taking blood thinners such as  aspirin, Coumadin, Ticiid, Plavix, etc., or if he/she have some genetic predisposition  such as hemophilia. Bleeding into the spinal canal can cause compression of the spinal  cord with subsequent paralysis.  This would require an  emergency surgery to  decompress and there are no guarantees that the patient would recover from the  paralysis.       5. Pneumothorax:  Puncturing of a lung is a possibility, every time a needle is introduced in  the area of the chest or upper back.  Pneumothorax refers to free air around the  collapsed lung(s), inside of the thoracic cavity (chest cavity).  Another two possible  complications related to a similar event would include: Hemothorax and Chylothorax.   These are variations of the Pneumothorax, where instead of air around the collapsed  lung(s), you may have blood or chyle, respectively.       6. Spinal headaches: They may occur with any procedures in the area of the spine.       7. Persistent CSF (Cerebro-Spinal Fluid) leakage: This is a rare problem, but may occur  with prolonged intrathecal or epidural catheters either due to the formation of a fistulous  track or a dural tear.       8. Nerve damage: By working so close to the spinal cord, there is always a possibility of  nerve damage, which could be as serious as a permanent spinal cord injury with  paralysis.       9. Death:  Although rare, severe deadly allergic reactions known as "Anaphylactic  reaction" can occur to any of the medications used.      10. Worsening of the symptoms:  We can always make thing worse.    What are the chances of something like this happening? Chances of any of this occuring are extremely low.  By statistics, you have more of a chance of getting killed in a motor vehicle accident: while driving to the hospital than any of the above occurring .  Nevertheless, you should be aware that they are possibilities.  In general, it is similar to taking a shower.  Everybody knows that you can slip, hit your head and get killed.  Does that mean that you should not shower again?  Nevertheless always keep in mind that statistics do not mean anything if you happen to be on the wrong side of them.  Even if a procedure has a 1  (one) in a 1,000,000 (million) chance of going wrong, it you happen to be that one..Also, keep in mind that by statistics, you have more of a chance of having something go wrong when taking medications.  Who should not have this procedure? If you are on a blood thinning medication (e.g. Coumadin, Plavix, see list of "Blood Thinners"), or if you have an active infection going on, you should not have the procedure.  If you are taking any blood thinners, please inform your physician.  How should I prepare for this procedure?  Do not eat or drink anything at least six hours prior to the procedure.  Bring a driver with you .  It cannot be a taxi.  Come accompanied by an adult that can drive you back, and that is strong enough to help you if your legs get weak or numb from the local anesthetic.  Take all of your medicines the morning of the procedure with just enough water to swallow them.  If you have diabetes, make sure that you are scheduled to have your procedure done first thing in the morning, whenever possible.  If you have diabetes, take only half of your insulin dose and notify our nurse that you have done so as soon as you arrive at the clinic.  If you are diabetic, but only take blood sugar pills (oral hypoglycemic), then do not take them on the morning of your procedure.  You may take them after you have had the procedure.  Do not take aspirin or any aspirin-containing medications, at least eleven (11) days prior to the procedure.  They may prolong bleeding.  Wear loose fitting clothing that may be easy to take off and that you would not mind if it got stained with Betadine or blood.  Do not wear any jewelry or perfume  Remove any nail coloring.  It will interfere with some of our monitoring equipment.  NOTE: Remember that this is not meant to be interpreted as a complete list of all possible complications.  Unforeseen problems may occur.  BLOOD THINNERS The following drugs  contain aspirin or other products, which can cause increased bleeding during surgery and should not be taken for 2 weeks prior to and 1 week after surgery.  If you should need take something for relief of minor pain, you may take acetaminophen which is found in Tylenol,m Datril, Anacin-3 and Panadol. It is not blood thinner. The products listed below are.  Do not take any of the products listed below in addition to any listed on your instruction sheet.  A.P.C or A.P.C with Codeine Codeine Phosphate Capsules #3 Ibuprofen Ridaura  ABC compound Congesprin Imuran rimadil  Advil Cope Indocin Robaxisal  Alka-Seltzer Effervescent Pain Reliever and Antacid Coricidin or Coricidin-D  Indomethacin Rufen    Alka-Seltzer plus Cold Medicine Cosprin Ketoprofen S-A-C Tablets  Anacin Analgesic Tablets or Capsules Coumadin Korlgesic Salflex  Anacin Extra Strength Analgesic tablets or capsules CP-2 Tablets Lanoril Salicylate  Anaprox Cuprimine Capsules Levenox Salocol  Anexsia-D Dalteparin Magan Salsalate  Anodynos Darvon compound Magnesium Salicylate Sine-off  Ansaid Dasin Capsules Magsal Sodium Salicylate  Anturane Depen Capsules Marnal Soma  APF Arthritis pain formula Dewitt's Pills Measurin Stanback  Argesic Dia-Gesic Meclofenamic Sulfinpyrazone  Arthritis Bayer Timed Release Aspirin Diclofenac Meclomen Sulindac  Arthritis pain formula Anacin Dicumarol Medipren Supac  Analgesic (Safety coated) Arthralgen Diffunasal Mefanamic Suprofen  Arthritis Strength Bufferin Dihydrocodeine Mepro Compound Suprol  Arthropan liquid Dopirydamole Methcarbomol with Aspirin Synalgos  ASA tablets/Enseals Disalcid Micrainin Tagament  Ascriptin Doan's Midol Talwin  Ascriptin A/D Dolene Mobidin Tanderil  Ascriptin Extra Strength Dolobid Moblgesic Ticlid  Ascriptin with Codeine Doloprin or Doloprin with Codeine Momentum Tolectin  Asperbuf Duoprin Mono-gesic Trendar  Aspergum Duradyne Motrin or Motrin IB Triminicin  Aspirin  plain, buffered or enteric coated Durasal Myochrisine Trigesic  Aspirin Suppositories Easprin Nalfon Trillsate  Aspirin with Codeine Ecotrin Regular or Extra Strength Naprosyn Uracel  Atromid-S Efficin Naproxen Ursinus  Auranofin Capsules Elmiron Neocylate Vanquish  Axotal Emagrin Norgesic Verin  Azathioprine Empirin or Empirin with Codeine Normiflo Vitamin E  Azolid Emprazil Nuprin Voltaren  Bayer Aspirin plain, buffered or children's or timed BC Tablets or powders Encaprin Orgaran Warfarin Sodium  Buff-a-Comp Enoxaparin Orudis Zorpin  Buff-a-Comp with Codeine Equegesic Os-Cal-Gesic   Buffaprin Excedrin plain, buffered or Extra Strength Oxalid   Bufferin Arthritis Strength Feldene Oxphenbutazone   Bufferin plain or Extra Strength Feldene Capsules Oxycodone with Aspirin   Bufferin with Codeine Fenoprofen Fenoprofen Pabalate or Pabalate-SF   Buffets II Flogesic Panagesic   Buffinol plain or Extra Strength Florinal or Florinal with Codeine Panwarfarin   Buf-Tabs Flurbiprofen Penicillamine   Butalbital Compound Four-way cold tablets Penicillin   Butazolidin Fragmin Pepto-Bismol   Carbenicillin Geminisyn Percodan   Carna Arthritis Reliever Geopen Persantine   Carprofen Gold's salt Persistin   Chloramphenicol Goody's Phenylbutazone   Chloromycetin Haltrain Piroxlcam   Clmetidine heparin Plaquenil   Cllnoril Hyco-pap Ponstel   Clofibrate Hydroxy chloroquine Propoxyphen         Before stopping any of these medications, be sure to consult the physician who ordered them.  Some, such as Coumadin (Warfarin) are ordered to prevent or treat serious conditions such as "deep thrombosis", "pumonary embolisms", and other heart problems.  The amount of time that you may need off of the medication may also vary with the medication and the reason for which you were taking it.  If you are taking any of these medications, please make sure you notify your pain physician before you undergo any  procedures.         Epidural Steroid Injection Patient Information  Description: The epidural space surrounds the nerves as they exit the spinal cord.  In some patients, the nerves can be compressed and inflamed by a bulging disc or a tight spinal canal (spinal stenosis).  By injecting steroids into the epidural space, we can bring irritated nerves into direct contact with a potentially helpful medication.  These steroids act directly on the irritated nerves and can reduce swelling and inflammation which often leads to decreased pain.  Epidural steroids may be injected anywhere along the spine and from the neck to the low back depending upon the location of your pain.   After numbing the skin with local anesthetic (like Novocaine), a small needle is passed   into the epidural space slowly.  You may experience a sensation of pressure while this is being done.  The entire block usually last less than 10 minutes.  Conditions which may be treated by epidural steroids:   Low back and leg pain  Neck and arm pain  Spinal stenosis  Post-laminectomy syndrome  Herpes zoster (shingles) pain  Pain from compression fractures  Preparation for the injection:  1. Do not eat any solid food or dairy products within 8 hours of your appointment.  2. You may drink clear liquids up to 3 hours before appointment.  Clear liquids include water, black coffee, juice or soda.  No milk or cream please. 3. You may take your regular medication, including pain medications, with a sip of water before your appointment  Diabetics should hold regular insulin (if taken separately) and take 1/2 normal NPH dos the morning of the procedure.  Carry some sugar containing items with you to your appointment. 4. A driver must accompany you and be prepared to drive you home after your procedure.  5. Bring all your current medications with your. 6. An IV may be inserted and sedation may be given at the discretion of the  physician.   7. A blood pressure cuff, EKG and other monitors will often be applied during the procedure.  Some patients may need to have extra oxygen administered for a short period. 8. You will be asked to provide medical information, including your allergies, prior to the procedure.  We must know immediately if you are taking blood thinners (like Coumadin/Warfarin)  Or if you are allergic to IV iodine contrast (dye). We must know if you could possible be pregnant.  Possible side-effects:  Bleeding from needle site  Infection (rare, may require surgery)  Nerve injury (rare)  Numbness & tingling (temporary)  Difficulty urinating (rare, temporary)  Spinal headache ( a headache worse with upright posture)  Light -headedness (temporary)  Pain at injection site (several days)  Decreased blood pressure (temporary)  Weakness in arm/leg (temporary)  Pressure sensation in back/neck (temporary)  Call if you experience:  Fever/chills associated with headache or increased back/neck pain.  Headache worsened by an upright position.  New onset weakness or numbness of an extremity below the injection site  Hives or difficulty breathing (go to the emergency room)  Inflammation or drainage at the infection site  Severe back/neck pain  Any new symptoms which are concerning to you  Please note:  Although the local anesthetic injected can often make your back or neck feel good for several hours after the injection, the pain will likely return.  It takes 3-7 days for steroids to work in the epidural space.  You may not notice any pain relief for at least that one week.  If effective, we will often do a series of three injections spaced 3-6 weeks apart to maximally decrease your pain.  After the initial series, we generally will wait several months before considering a repeat injection of the same type.  If you have any questions, please call [phone] [facility] 

## 2018-12-16 NOTE — Progress Notes (Signed)
Patient's Name: Teresa [name]  MRN: [id number]  Referring Provider: Tracie Harrier, MD  DOB: 28-Sep-1956  PCP: Tracie Harrier, MD  DOS: 12/16/2018  Note by: Gillis Santa, MD  Service setting: Ambulatory outpatient  Specialty: Interventional Pain Management  Location: [facility] (AMB) Pain Management Facility  Visit type: Initial Patient Evaluation  Patient type: New Patient   Primary Reason(s) for Visit: Encounter for initial evaluation of one or more chronic problems (new to examiner) potentially causing chronic pain, and posing a threat to normal musculoskeletal function. (Level of risk: High) CC: Back Pain (lower)  HPI  [name] is a 63 y.o. year old, female patient, who comes today to see [place] for the first time for an initial evaluation of her chronic pain. She has INSOMNIA, CHRONIC; DEPRESSION; MIGRAINE HEADACHE; GERD; Constipation; BACK PAIN, CHRONIC; OSTEOPENIA; Encounter for screening colonoscopy; Essential (primary) hypertension; Iron deficiency anemia; Lumbar radiculopathy (right L5); Lumbar facet arthropathy (L3/4 and L4/5); Degeneration of lumbar or lumbosacral intervertebral disc; and Chronic pain syndrome on their problem list. Today she comes in for evaluation of her Back Pain (lower)  Pain Assessment: Location: Lower Back Radiating: numbness in toes on right foot Onset: More than a month ago Duration: Chronic pain Quality: Throbbing Severity: 5 /10 (subjective, self-reported pain score)  Note: Reported level is compatible with observation.                         When using our objective Pain Scale, levels between 6 and 10/10 are said to belong in an emergency room, as it progressively worsens from a 6/10, described as severely limiting, requiring emergency care not usually available at an outpatient pain management facility. At a 6/10 level, communication becomes difficult and requires great effort. Assistance to reach the emergency department may be required. Facial flushing and  profuse sweating along with potentially dangerous increases in heart rate and blood pressure will be evident. Effect on ADL: difficulty doing activities at home Timing: Constant Modifying factors: lying down BP: (!) 123/57  HR: 89  Onset and Duration: Present longer than 3 months Cause of pain: Unknown Severity: Getting better, NAS-11 at its worse: 10/10, NAS-11 at its best: 5/10, NAS-11 now: 6/10 and NAS-11 on the average: 6/10 Timing: Afternoon and Not influenced by the time of the day Aggravating Factors: Bending, Kneeling, Motion, Prolonged sitting, Squatting and Twisting Alleviating Factors: Lying down and Sleeping Associated Problems: Constipation, Fatigue and Sadness Quality of Pain: Aching, Annoying and Burning Previous Examinations or Tests: MRI scan Previous Treatments: The patient denies none noted  The patient comes into the clinics today for the first time for a chronic pain management evaluation.   Patient is a very pleasant 63 year old female who presents with a chief complaint of axial low back and right buttock pain also with intermittent associated numbness of her right foot.  This began fairly suddenly in 2019 and has worsened over the last couple of months.  Resting helps to alleviate her pain.  Patient has tried home exercise programs including stretching and strengthening exercises.  No significant benefit with this.  She is also tried chiropractic therapy.  Patient is status post a bilateral S1 transforaminal epidural steroid injection which she states increased her pain.  Patient has had previous transforaminal S1 epidural steroid injections in the past which did help.  Patient also endorses bilateral neck pain with extension to her left scapula.  Patient has received trigger point injections for this sort of pain.  Patient  is currently not on any opioid analgesics.  She is on gabapentin 300 mg twice daily, nortriptyline 10 mg 3 times daily, Topamax 100 mg  daily.  Today I took the time to provide the patient with information regarding my pain practice. The patient was informed that my practice is divided into two sections: an interventional pain management section, as well as a completely separate and distinct medication management section. I explained that I have procedure days for my interventional therapies, and evaluation days for follow-ups and medication management. Because of the amount of documentation required during both, they are kept separated. This means that there is the possibility that she may be scheduled for a procedure on one day, and medication management the next. I have also informed her that because of staffing and facility limitations, I no longer take patients for medication management only. To illustrate the reasons for this, I gave the patient the example of surgeons, and how inappropriate it would be to refer a patient to his/her care, just to write for the post-surgical antibiotics on a surgery done by a different surgeon.   Because interventional pain management is my board-certified specialty, the patient was informed that joining my practice means that they are open to any and all interventional therapies. I made it clear that this does not mean that they will be forced to have any procedures done. What this means is that I believe interventional therapies to be essential part of the diagnosis and proper management of chronic pain conditions. Therefore, patients not interested in these interventional alternatives will be better served under the care of a different practitioner.  The patient was also made aware of my Comprehensive Pain Management Safety Guidelines where by joining my practice, they limit all of their nerve blocks and joint injections to those done by our practice, for as long as we are retained to manage their care.   Historic Controlled Substance Pharmacotherapy Review    No results found for: AMPHSCRSER,  BARBSCRSER, BENZOSCRSER, COCAINSCRSER, COCAINSCRNUR, PCPSCRSER, PCPQUANT, THCSCRSER, THCU, CANNABQUANT, OPIATESCRSER, OXYSCRSER, PROPOXSCRSER, ETH Historical Background Evaluation: [place] PMP: Six (6) year initial data search conducted.             [place] Department of public safety, offender search: Editor, commissioning Information) Non-contributory Risk Assessment Profile: Aberrant behavior: None observed or detected today Risk factors for fatal opioid overdose: None identified today Fatal overdose hazard ratio (HR): Calculation deferred Non-fatal overdose hazard ratio (HR): Calculation deferred Risk of opioid abuse or dependence: 0.7-3.0% with doses ? 36 MME/day and 6.1-26% with doses ? 120 MME/day. Substance use disorder (SUD) risk level: See below Personal History of Substance Abuse (SUD-Substance use disorder):  Alcohol: Negative  Illegal Drugs: Negative  Rx Drugs: Negative  ORT Risk Level calculation: Low Risk Opioid Risk Tool - 12/16/18 1107      Family History of Substance Abuse   Alcohol  Negative    Illegal Drugs  Negative    Rx Drugs  Negative      Personal History of Substance Abuse   Alcohol  Negative    Illegal Drugs  Negative    Rx Drugs  Negative      Age   Age between 57-45 years   No      History of Preadolescent Sexual Abuse   History of Preadolescent Sexual Abuse  Negative or Female      Psychological Disease   Psychological Disease  Negative    Depression  Negative      Total Score  Opioid Risk Tool Scoring  0    Opioid Risk Interpretation  Low Risk      ORT Scoring interpretation table:  Score <3 = Low Risk for SUD  Score between 4-7 = Moderate Risk for SUD  Score >8 = High Risk for Opioid Abuse   PHQ-2 Depression Scale:  Total score: 0  PHQ-2 Scoring interpretation table: (Score and probability of major depressive disorder)  Score 0 = No depression  Score 1 = 15.4% Probability  Score 2 = 21.1% Probability  Score 3 = 38.4% Probability  Score 4 = 45.5%  Probability  Score 5 = 56.4% Probability  Score 6 = 78.6% Probability   PHQ-9 Depression Scale:  Total score: 0  PHQ-9 Scoring interpretation table:  Score 0-4 = No depression  Score 5-9 = Mild depression  Score 10-14 = Moderate depression  Score 15-19 = Moderately severe depression  Score 20-27 = Severe depression (2.4 times higher risk of SUD and 2.89 times higher risk of overuse)   Pharmacologic Plan: As per protocol, I have not taken over any controlled substance management, pending the results of ordered tests and/or consults.            Initial impression: Pending review of available data and ordered tests.  Meds   Current Outpatient Medications:  .  alendronate (FOSAMAX) 70 MG tablet, TAKE 1 TABLET ONCE A WEEK IN THE AM 30 MIN BEFORE BREAKFAST WITH FULL GLASS OF WATER, Disp: 12 tablet, Rfl: 3 .  CALCIUM PO, Take by mouth daily., Disp: , Rfl:  .  EZETIMIBE PO, Take 10 mg by mouth daily., Disp: , Rfl:  .  ferrous gluconate (FERGON) 240 (27 FE) MG tablet, Take by mouth., Disp: , Rfl:  .  FLECTOR 1.3 % PTCH, PLACE 1 PATCH ONTO THE SKIN 2 (TWO) TIMES DAILY. APPLY PATCH TO THE MOST PAINFUL AREA., Disp: , Rfl: 11 .  gabapentin (NEURONTIN) 300 MG capsule, Take 300 mg by mouth 2 (two) times daily., Disp: , Rfl:  .  Multiple Vitamin (MULTIVITAMIN) capsule, Take 1 capsule by mouth daily., Disp: , Rfl:  .  nortriptyline (PAMELOR) 50 MG capsule, 10 mg 3 (three) times daily. , Disp: , Rfl: 0 .  topiramate (TOPAMAX) 25 MG capsule, Take 100 mg by mouth daily. , Disp: , Rfl:  .  traZODone (DESYREL) 100 MG tablet, TAKE 1 TABLET BY MOUTH AT BEDTIME AS NEEDED, Disp: 30 tablet, Rfl: 5 .  zolpidem (AMBIEN) 10 MG tablet, TAKE 1/2 TO 1 TABLET BY MOUTH EVERY DAY AT BEDTIME AS NEEDED, Disp: 30 tablet, Rfl: 2 .  Cholecalciferol (VITAMIN D-3 PO), Take by mouth daily., Disp: , Rfl:  .  diclofenac (VOLTAREN) 75 MG EC tablet, Take 1 tablet (75 mg total) by mouth 2 (two) times daily., Disp: 60 tablet, Rfl:  1 .  magnesium oxide (MAG-OX) 400 MG tablet, Take 400 mg by mouth 2 (two) times daily., Disp: , Rfl:  .  propranolol (INDERAL) 10 MG tablet, Take by mouth., Disp: , Rfl:  .  traMADol (ULTRAM) 50 MG tablet, 1-2 tablets every 4-6 hours as needed for pain (Patient not taking: Reported on 12/16/2018), Disp: 30 tablet, Rfl: 0  Imaging Review   Thoracic Imaging: Thoracic MR wo contrast:  Results for orders placed during the hospital encounter of 02/25/18  MR THORACIC SPINE WO CONTRAST   Narrative CLINICAL DATA:  63 y/o F; left-sided upper back pain for 1-1/2 months.  EXAM: MRI THORACIC SPINE WITHOUT CONTRAST  TECHNIQUE: Multiplanar, multisequence  MR imaging of the thoracic spine was performed. No intravenous contrast was administered.  COMPARISON:  03/17/2007 thoracic spine MRI  FINDINGS: Alignment:  Physiologic.  Vertebrae: Stable mild T4 and T8 compression deformities without edema. No new fracture, diskitis, or bone lesion.  Cord:  Normal signal and morphology.  Paraspinal and other soft tissues: Negative.  Disc levels:  No significant disc displacement, foraminal stenosis, or canal stenosis.  IMPRESSION: 1. Stable mild T4 and T8 chronic compression deformities. 2. No new acute osseous abnormality. 3. No significant disc displacement, foraminal stenosis, or canal stenosis.   Electronically Signed   By: Kristine Garbe M.D.   On: 02/25/2018 14:11      Lumbosacral Imaging: Lumbar MR wo contrast:  Results for orders placed during the hospital encounter of 12/01/18  MR LUMBAR SPINE WO CONTRAST   Narrative CLINICAL DATA:  Low back pain extending to the sacral region. Numbness of the right foot. Duration of symptoms 6 weeks.  EXAM: MRI LUMBAR SPINE WITHOUT CONTRAST  TECHNIQUE: Multiplanar, multisequence MR imaging of the lumbar spine was performed. No intravenous contrast was administered.  COMPARISON:  Radiography 06/24/2016.  MRI  02/25/2016.  FINDINGS: Segmentation: 5 lumbar type vertebral bodies as numbered previously.  Alignment:  Curvature convex to the right with the apex at L3.  Vertebrae:  No fracture or primary bone lesion.  Conus medullaris and cauda equina: Conus extends to the L1 level. Conus and cauda equina appear normal.  Paraspinal and other soft tissues: Negative  Disc levels:  No significant finding at L2-3 or above.  L3-4: Mild bulging of the disc more towards the left. Mild facet and ligamentous hypertrophy. No compressive canal or foraminal stenosis.  L4-5: Disc degeneration with bulging of the disc more prominent towards the left. Mild facet and ligamentous hypertrophy on the left. Mild narrowing of the left lateral recess and intervertebral foramen on the left but without visible neural compression.  L5-S1: Disc degeneration and bulging more prominent towards the right. No significant facet degeneration or hypertrophic change. Mild narrowing of the right foramen and right extraforaminal encroachment with some potential this could irritate the right L5 nerve. Nerve compression is not demonstrated.  S1-2: Rudimentary and normal.  Compared to the study of 2017, the findings are quite similar.  IMPRESSION: Similar appearance to the study of 2017. Curvature convex to the right with the apex at L3. Lower lumbar degenerative disc disease and degenerative facet disease with mild left lateral recess and foraminal narrowing at L4-5 and mild right foraminal to extraforaminal encroachment by endplate osteophytes and bulging disc at L5-S1. There is some potential this could irritate the right L5 nerve, but definite compression is not established.   Electronically Signed   By: Nelson Chimes M.D.   On: 12/01/2018 13:57     Results for orders placed in visit on 08/02/02  DG Lumbar Spine Complete   Narrative FINDINGS CLINICAL DATA:  LOW BACK AND RIGHT HIP PAIN; NO KNOWN INJURY LUMBAR  SPINE FOUR VIEWS:NO COMPARISON.  THERE ARE ONLY FOUR NON-RIB-BEARING LUMBAR VERTEBRAE.  T12 HAS SMALL RUDIMENTARY RIBS BILATERALLY.  IF THERE ARE PREVIOUS FILMS WHICH SHOW THIS PRESUMED T12 VERTEBRAE TO INDEED REPRESENT L1, THEN PLEASE CHANGE THE CURRENT NUMBERING SCHEME ACCORDINGLY. THERE IS SEVERE GENERALIZED LOSS OF BONE MINERAL DENSITY FOR AGE.  THERE IS SLIGHT RETROLISTHESIS OF L4 RELATIVE TO S1 ON THE ORDER OF 3-4MM.  DISC SPACE NARROWING IS PRESENT AT L3-4 AND L4-S1. MILD LUMBAR SCOLIOSIS CONVEX RIGHT IS NOTED.  THERE IS NO  EVIDENCE OF AN ACUTE FRACTURE.  OBLIQUE VIEWS DEMONSTRATE NO PARS DEFECTS OR SIGNIFICANT FACET ARTHROPATHY. IMPRESSION FOUR NON-RIB-BEARING LUMBAR VERTEBRAE.  3-4MM RETROLISTHESIS OF L4 RELATIVE TO S1.  DEGENERATIVE DISC DISEASE L3-4 AND L4-S1.  SEVERE GENERALIZED BONE MINERAL DENSITY LOSS.     Complexity Note: Imaging results reviewed. Results shared with Ms. Caryl Pina, using Layman's terms.                         ROS  Cardiovascular: Heart murmur Pulmonary or Respiratory: No reported pulmonary signs or symptoms such as wheezing and difficulty taking a deep full breath (Asthma), difficulty blowing air out (Emphysema), coughing up mucus (Bronchitis), persistent dry cough, or temporary stoppage of breathing during sleep Neurological: No reported neurological signs or symptoms such as seizures, abnormal skin sensations, urinary and/or fecal incontinence, being born with an abnormal open spine and/or a tethered spinal cord Review of Past Neurological Studies:  Results for orders placed or performed during the hospital encounter of 03/04/16  MR Brain W Wo Contrast   Narrative   CLINICAL DATA:  Pulsatile migraine headaches centered in the left temporal region over the last 3 years. Sleep disturbance. Non intractable headache.  EXAM: MRI HEAD WITHOUT AND WITH CONTRAST  TECHNIQUE: Multiplanar, multiecho pulse sequences of the brain and surrounding structures were  obtained without and with intravenous contrast.  CONTRAST:  79m MULTIHANCE GADOBENATE DIMEGLUMINE 529 MG/ML IV SOLN  COMPARISON:  CT of the head 08/14/2014.  FINDINGS: No acute infarct, hemorrhage, or mass lesion is present. The ventricles are of normal size. No significant extraaxial fluid collection is present.  No significant white matter disease is present. The internal auditory canals are within normal limits bilaterally. The brainstem and cerebellum are normal.  Flow is present in the major intracranial arteries. A left lens replacement is noted. The right globe is intact. The orbits are within normal limits bilaterally. The paranasal sinuses and mastoid air cells are clear.  The skullbase is within normal limits. Midline sagittal images are unremarkable.  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 No pathologic enhancement is present.  IMPRESSION: Normal MRI appearance of the brain. No acute or focal lesion to explain pulsatile migraine headaches.   Electronically Signed   By: CSan MorelleM.D.   On: 03/04/2016 12:21    Psychological-Psychiatric: No reported psychological or psychiatric signs or symptoms such as difficulty sleeping, anxiety, depression, delusions or hallucinations (schizophrenial), mood swings (bipolar disorders) or suicidal ideations or attempts Gastrointestinal: Irregular, infrequent bowel movements (Constipation) Genitourinary: No reported renal or genitourinary signs or symptoms such as difficulty voiding or producing urine, peeing blood, non-functioning kidney, kidney stones, difficulty emptying the bladder, difficulty controlling the flow of urine, or chronic kidney disease Hematological: Weakness due to low blood hemoglobin or red blood cell count (Anemia) Endocrine: No reported endocrine signs or symptoms such as high or low blood sugar, rapid heart rate due to high thyroid  levels, obesity or weight gain due to slow thyroid or thyroid disease Rheumatologic: Joint aches and or swelling due to excess weight (Osteoarthritis) Musculoskeletal: Negative for myasthenia gravis, muscular dystrophy, multiple sclerosis or malignant hyperthermia Work History: Retired  Allergies  Ms. ADevargasis allergic to codeine and morphine.  Laboratory Chemistry  Inflammation Markers (CRP: Acute Phase) (ESR: Chronic Phase) No results found for: CRP, ESRSEDRATE, LATICACIDVEN  Rheumatology Markers No results found for: RF, ANA, LABURIC, URICUR, LYMEIGGIGMAB, LYMEABIGMQN, HLAB27                      Renal Function Markers Lab Results  Component Value Date   BUN 11 08/14/2014   CREATININE 1.10 (H) 02/25/2018   GFRAA >90 08/14/2014   GFRNONAA 89 (L) 08/14/2014                             Hepatic Function Markers No results found for: AST, ALT, ALBUMIN, ALKPHOS, HCVAB, AMYLASE, LIPASE, AMMONIA                      Electrolytes Lab Results  Component Value Date   NA 139 08/14/2014   K 4.0 08/14/2014   CL 99 08/14/2014   CALCIUM 10.2 08/14/2014                        Neuropathy Markers No results found for: VITAMINB12, FOLATE, HGBA1C, HIV                      CNS Tests No results found for: COLORCSF, APPEARCSF, RBCCOUNTCSF, WBCCSF, POLYSCSF, LYMPHSCSF, EOSCSF, PROTEINCSF, GLUCCSF, JCVIRUS, CSFOLI, IGGCSF                      Bone Pathology Markers No results found for: VD25OH, [id number], [id number], [id number], 25OHVITD1, 25OHVITD2, 25OHVITD3, TESTOFREE, TESTOSTERONE                       Coagulation Parameters Lab Results  Component Value Date   PLT 254 02/25/2016                        Cardiovascular Markers Lab Results  Component Value Date   HGB 11.8 (L) 02/25/2016   HCT 36.4 02/25/2016                         CA Markers No results found for: CEA, CA125, LABCA2                      Endocrine Markers No results found for: TSH, FREET4,  TESTOFREE, TESTOSTERONE, ESTRADIOL, ESTRADIOLPCT, ESTRADIOLFRE                      Note: Lab results reviewed.  [place]  Drug: Ms. Q[place]o  reports no history of drug use. Alcohol:  reports no history of alcohol use. Tobacco:  reports that she has never smoked. She has never used smokeless tobacco. Medical:  has a past medical history of Headache, History of fractured vertebra, Hyperlipidemia, Low bone density, and Transfusion of blood product refused for religious reason. Family: family history includes Dementia in her mother; Heart attack in her father.  Past Surgical History:  Procedure Laterality Date  . ABDOMINAL HYSTERECTOMY    . ARTERY BIOPSY Left 12/18/2015   Procedure: BIOPSY TEMPORAL ARTERY;  Surgeon: Clyde Canterbury, MD;  Location: [facility];  Service: ENT;  Laterality: Left;  . COLONOSCOPY  2000  . EYE SURGERY Left 2014   Active Ambulatory Problems    Diagnosis Date Noted  . INSOMNIA, CHRONIC 09/08/2007  . DEPRESSION 08/25/2007  . MIGRAINE HEADACHE 08/25/2007  . GERD 08/25/2007  . Constipation 08/25/2007  . BACK PAIN, CHRONIC  09/08/2007  . OSTEOPENIA 08/25/2007  . Encounter for screening colonoscopy 10/20/2013  . Essential (primary) hypertension 09/04/2014  . Iron deficiency anemia 01/24/2016  . Lumbar radiculopathy (right L5) 12/16/2018  . Lumbar facet arthropathy (L3/4 and L4/5) 12/16/2018  . Degeneration of lumbar or lumbosacral intervertebral disc 12/16/2018  . Chronic pain syndrome 12/16/2018   Resolved Ambulatory Problems    Diagnosis Date Noted  . No Resolved Ambulatory Problems   Past Medical History:  Diagnosis Date  . Headache   . History of fractured vertebra   . Hyperlipidemia   . Low bone density   . Transfusion of blood product refused for religious reason    Constitutional Exam  General appearance: Well nourished, well developed, and well hydrated. In no apparent acute distress Vitals:   12/16/18 1056  BP: (!) 123/57  Pulse: 89   Resp: 14  Temp: 97.8 F (36.6 C)  TempSrc: Oral  SpO2: 100%  Weight: 130 lb (59 kg)  Height: _0  (1.676 m)   BMI Assessment: Estimated body mass index is 20.98 kg/m as calculated from the following:   Height as of this encounter: _1  (1.676 m).   Weight as of this encounter: 130 lb (59 kg).  BMI interpretation table: BMI level Category Range association with higher incidence of chronic pain  <18 kg/m2 Underweight   18.5-24.9 kg/m2 Ideal body weight   25-29.9 kg/m2 Overweight Increased incidence by 20%  30-34.9 kg/m2 Obese (Class I) Increased incidence by 68%  35-39.9 kg/m2 Severe obesity (Class II) Increased incidence by 136%  >40 kg/m2 Extreme obesity (Class III) Increased incidence by 254%   Patient's current BMI Ideal Body weight  Body mass index is 20.98 kg/m. Ideal body weight: 59.3 kg (130 lb 11.7 oz)   BMI Readings from Last 4 Encounters:  12/16/18 20.98 kg/m  02/27/16 20.68 kg/m  01/24/16 21.16 kg/m  12/18/15 21.31 kg/m   Wt Readings from Last 4 Encounters:  12/16/18 130 lb (59 kg)  02/27/16 130 lb 15.3 oz (59.4 kg)  01/24/16 134 lb 0.6 oz (60.8 kg)  12/18/15 132 lb (59.9 kg)  Psych/Mental status: Alert, oriented x 3 (person, place, & time)       Eyes: PERLA Respiratory: No evidence of acute respiratory distress  Cervical Spine Area Exam  Skin & Axial Inspection: No masses, redness, edema, swelling, or associated skin lesions Alignment: Symmetrical Functional ROM: Decreased ROM      Stability: No instability detected Muscle Tone/Strength: Functionally intact. No obvious neuro-muscular anomalies detected. Sensory (Neurological): Musculoskeletal pain pattern Palpation: No palpable anomalies              Upper Extremity (UE) Exam    Side: Right upper extremity  Side: Left upper extremity  Skin & Extremity Inspection: Skin color, temperature, and hair growth are WNL. No peripheral edema or cyanosis. No masses, redness, swelling, asymmetry, or  associated skin lesions. No contractures.  Skin & Extremity Inspection: Skin color, temperature, and hair growth are WNL. No peripheral edema or cyanosis. No masses, redness, swelling, asymmetry, or associated skin lesions. No contractures.  Functional ROM: Unrestricted ROM          Functional ROM: Unrestricted ROM          Muscle Tone/Strength: Functionally intact. No obvious neuro-muscular anomalies detected.  Muscle Tone/Strength: Functionally intact. No obvious neuro-muscular anomalies detected.  Sensory (Neurological): Unimpaired          Sensory (Neurological): Unimpaired          Palpation: No  palpable anomalies              Palpation: No palpable anomalies              Provocative Test(s):  Phalen's test: deferred Tinel's test: deferred Apley's scratch test (touch opposite shoulder):  Action 1 (Across chest): deferred Action 2 (Overhead): deferred Action 3 (LB reach): deferred   Provocative Test(s):  Phalen's test: deferred Tinel's test: deferred Apley's scratch test (touch opposite shoulder):  Action 1 (Across chest): deferred Action 2 (Overhead): deferred Action 3 (LB reach): deferred    Thoracic Spine Area Exam  Skin & Axial Inspection: No masses, redness, or swelling Alignment: Symmetrical Functional ROM: Unrestricted ROM Stability: No instability detected Muscle Tone/Strength: Functionally intact. No obvious neuro-muscular anomalies detected. Sensory (Neurological): Unimpaired Muscle strength & Tone: No palpable anomalies  Lumbar Spine Area Exam  Skin & Axial Inspection: No masses, redness, or swelling Alignment: Symmetrical Functional ROM: Pain restricted ROM affecting both sides Stability: No instability detected Muscle Tone/Strength: Functionally intact. No obvious neuro-muscular anomalies detected. Sensory (Neurological): Dermatomal pain pattern and musculoskeletal Palpation: Complains of area being tender to palpation Bilateral Fist Percussion Test Provocative  Tests: Hyperextension/rotation test: (+) bilaterally for facet joint pain. Lumbar quadrant test (Kemp's test): (+) on the right for foraminal stenosis Lateral bending test: (+) due to pain. Patrick's Maneuver: deferred today                   FABER* test: deferred today                   S-I anterior distraction/compression test: deferred today         S-I lateral compression test: deferred today         S-I Thigh-thrust test: deferred today         S-I Gaenslen's test: deferred today         *(Flexion, ABduction and External Rotation)  Gait & Posture Assessment  Ambulation: Unassisted Gait: Relatively normal for age and body habitus Posture: WNL   Lower Extremity Exam    Side: Right lower extremity  Side: Left lower extremity  Stability: No instability observed          Stability: No instability observed          Skin & Extremity Inspection: Skin color, temperature, and hair growth are WNL. No peripheral edema or cyanosis. No masses, redness, swelling, asymmetry, or associated skin lesions. No contractures.  Skin & Extremity Inspection: Skin color, temperature, and hair growth are WNL. No peripheral edema or cyanosis. No masses, redness, swelling, asymmetry, or associated skin lesions. No contractures.  Functional ROM: Unrestricted ROM                  Functional ROM: Unrestricted ROM                  Muscle Tone/Strength: Functionally intact. No obvious neuro-muscular anomalies detected.  Muscle Tone/Strength: Functionally intact. No obvious neuro-muscular anomalies detected.  Sensory (Neurological): Dermatomal pain pattern and musculoskeletal        Sensory (Neurological): Unimpaired        DTR: Patellar: 2+: normal Achilles: deferred today Plantar: deferred today  DTR: Patellar: 2+: normal Achilles: deferred today Plantar: deferred today  Palpation: No palpable anomalies  Palpation: No palpable anomalies   Assessment  Primary Diagnosis & Pertinent Problem List: The primary  encounter diagnosis was Lumbar radiculopathy (right L5). Diagnoses of Lumbar facet arthropathy (L3/4 and L4/5), Degeneration of lumbar or lumbosacral  intervertebral disc, and Chronic pain syndrome were also pertinent to this visit.  Visit Diagnosis (New problems to examiner): 1. Lumbar radiculopathy (right L5)   2. Lumbar facet arthropathy (L3/4 and L4/5)   3. Degeneration of lumbar or lumbosacral intervertebral disc   4. Chronic pain syndrome    Clinical history, radiographic evidence and physical exam consistent with right lumbar radiculopathy.  Patient is tried transforaminal S1 epidural steroid injections the last of which resulted in increased pain.  I had extensive discussion with the patient about retrying the epidural injection for her radicular symptoms however utilizing an alternate approach.  I discussed the interlaminar lumbar approach which would hopefully address multiple spine levels including L3-L4, L4-L5 and L5-S1.  Performing the procedure via an interlaminar approach may also allow additional volume and steroid medications to result and therapeutic benefit.  Risks and benefits of this procedure were discussed and patient would like to proceed.  Patient also has symptoms that could be related to facet arthropathy and lumbar spondylosis.  We discussed lumbar facet medial branch nerve blocks to address her axial low back pain could be related to facet degeneration.  If lumbar epidural steroid injection is not effective, can consider this.  Also recommend patient work with physical therapy to help out with lumbar paraspinal muscle strengthening and stretching exercises.  This may help out with her range of motion and posture.  We will also provide prescription for diclofenac as below.  Plan: -Lumbar radiculopathy: Interlaminar right epidural steroid injection at L4-L5. -Lumbar facet arthropathy, lumbar spondylosis, can consider diagnostic lumbar facet medial branch nerve blocks at  L3, L4, L5. -Referral to physical therapy -Prescription for diclofenac 75 mg twice daily   Ordered Lab-work, Procedure(s), Referral(s), & Consult(s): Orders Placed This Encounter  Procedures  . Lumbar Epidural Injection  . Ambulatory referral to Physical Therapy   Pharmacotherapy (current): Medications ordered:  Meds ordered this encounter  Medications  . diclofenac (VOLTAREN) 75 MG EC tablet    Sig: Take 1 tablet (75 mg total) by mouth 2 (two) times daily.    Dispense:  60 tablet    Refill:  1   Medications administered during this visit: Eloy End had no medications administered during this visit.   Pharmacological management options:  Opioid Analgesics: The patient was informed that there is no guarantee that she would be a candidate for opioid analgesics. The decision will be made following CDC guidelines. This decision will be based on the results of diagnostic studies, as well as Ms. Julius's risk profile.   Membrane stabilizer: To be determined at a later time  Muscle relaxant: To be determined at a later time  NSAID: To be determined at a later time  Other analgesic(s): To be determined at a later time   Interventional management options: Ms. Finnigan was informed that there is no guarantee that she would be a candidate for interventional therapies. The decision will be based on the results of diagnostic studies, as well as Ms. Baldus's risk profile.  Procedure(s) under consideration:  Right L4-L5 interlaminar epidural steroid injection Diagnostic lumbar facet medial branch nerve blocks Spinal cord stimulation   Provider-requested follow-up: Return in about 6 days (around 12/22/2018) for Procedure.  No future appointments.  Primary Care Physician: Tracie Harrier, MD Location: [facility] Note by: Gillis Santa, M.D, Date: 12/16/2018; Time: 3:34 PM  Patient Instructions   GENERAL RISKS AND COMPLICATIONS  What are the risk, side  effects and possible complications? Generally speaking, most procedures  are safe.  However, with any procedure there are risks, side effects, and the possibility of complications.  The risks and complications are dependent upon the sites that are lesioned, or the type of nerve block to be performed.  The closer the procedure is to the spine, the more serious the risks are.  Great care is taken when placing the radio frequency needles, block needles or lesioning probes, but sometimes complications can occur. 1. Infection: Any time there is an injection through the skin, there is a risk of infection.  This is why sterile conditions are used for these blocks.  There are four possible types of infection. 1. Localized skin infection. 2. Central Nervous System Infection-This can be in the form of Meningitis, which can be deadly. 3. Epidural Infections-This can be in the form of an epidural abscess, which can cause pressure inside of the spine, causing compression of the spinal cord with subsequent paralysis. This would require an emergency surgery to decompress, and there are no guarantees that the patient would recover from the paralysis. 4. Discitis-This is an infection of the intervertebral discs.  It occurs in about 1% of discography procedures.  It is difficult to treat and it may lead to surgery.        2. Pain: the needles have to go through skin and soft tissues, will cause soreness.       3. Damage to internal structures:  The nerves to be lesioned may be near blood vessels or    other nerves which can be potentially damaged.       4. Bleeding: Bleeding is more common if the patient is taking blood thinners such as  aspirin, Coumadin, Ticiid, Plavix, etc., or if he/she have some genetic predisposition  such as hemophilia. Bleeding into the spinal canal can cause compression of the spinal  cord with subsequent paralysis.  This would require an emergency surgery to  decompress and there are no guarantees  that the patient would recover from the  paralysis.       5. Pneumothorax:  Puncturing of a lung is a possibility, every time a needle is introduced in  the area of the chest or upper back.  Pneumothorax refers to free air around the  collapsed lung(s), inside of the thoracic cavity (chest cavity).  Another two possible  complications related to a similar event would include: Hemothorax and Chylothorax.   These are variations of the Pneumothorax, where instead of air around the collapsed  lung(s), you may have blood or chyle, respectively.       6. Spinal headaches: They may occur with any procedures in the area of the spine.       7. Persistent CSF (Cerebro-Spinal Fluid) leakage: This is a rare problem, but may occur  with prolonged intrathecal or epidural catheters either due to the formation of a fistulous  track or a dural tear.       8. Nerve damage: By working so close to the spinal cord, there is always a possibility of  nerve damage, which could be as serious as a permanent spinal cord injury with  paralysis.       9. Death:  Although rare, severe deadly allergic reactions known as "Anaphylactic  reaction" can occur to any of the medications used.      10. Worsening of the symptoms:  We can always make thing worse.  What are the chances of something like this happening? Chances of any of this occuring are extremely  low.  By statistics, you have more of a chance of getting killed in a motor vehicle accident: while driving to the hospital than any of the above occurring .  Nevertheless, you should be aware that they are possibilities.  In general, it is similar to taking a shower.  Everybody knows that you can slip, hit your head and get killed.  Does that mean that you should not shower again?  Nevertheless always keep in mind that statistics do not mean anything if you happen to be on the wrong side of them.  Even if a procedure has a 1 (one) in a 1,000,000 (million) chance of going wrong, it you  happen to be that one..Also, keep in mind that by statistics, you have more of a chance of having something go wrong when taking medications.  Who should not have this procedure? If you are on a blood thinning medication (e.g. Coumadin, Plavix, see list of "Blood Thinners"), or if you have an active infection going on, you should not have the procedure.  If you are taking any blood thinners, please inform your physician.  How should I prepare for this procedure?  Do not eat or drink anything at least six hours prior to the procedure.  Bring a driver with you .  It cannot be a taxi.  Come accompanied by an adult that can drive you back, and that is strong enough to help you if your legs get weak or numb from the local anesthetic.  Take all of your medicines the morning of the procedure with just enough water to swallow them.  If you have diabetes, make sure that you are scheduled to have your procedure done first thing in the morning, whenever possible.  If you have diabetes, take only half of your insulin dose and notify our nurse that you have done so as soon as you arrive at the clinic.  If you are diabetic, but only take blood sugar pills (oral hypoglycemic), then do not take them on the morning of your procedure.  You may take them after you have had the procedure.  Do not take aspirin or any aspirin-containing medications, at least eleven (11) days prior to the procedure.  They may prolong bleeding.  Wear loose fitting clothing that may be easy to take off and that you would not mind if it got stained with Betadine or blood.  Do not wear any jewelry or perfume  Remove any nail coloring.  It will interfere with some of our monitoring equipment.  NOTE: Remember that this is not meant to be interpreted as a complete list of all possible complications.  Unforeseen problems may occur.  BLOOD THINNERS The following drugs contain aspirin or other products, which can cause increased  bleeding during surgery and should not be taken for 2 weeks prior to and 1 week after surgery.  If you should need take something for relief of minor pain, you may take acetaminophen which is found in Tylenol,m Datril, Anacin-3 and Panadol. It is not blood thinner. The products listed below are.  Do not take any of the products listed below in addition to any listed on your instruction sheet.  A.P.C or A.P.C with Codeine Codeine Phosphate Capsules #3 Ibuprofen Ridaura  ABC compound Congesprin Imuran rimadil  Advil Cope Indocin Robaxisal  Alka-Seltzer Effervescent Pain Reliever and Antacid Coricidin or Coricidin-D  Indomethacin Rufen  Alka-Seltzer plus Cold Medicine Cosprin Ketoprofen S-A-C Tablets  Anacin Analgesic Tablets or Capsules Coumadin Korlgesic Salflex  Anacin Extra Strength Analgesic tablets or capsules CP-2 Tablets Lanoril Salicylate  Anaprox Cuprimine Capsules Levenox Salocol  Anexsia-D Dalteparin Magan Salsalate  Anodynos Darvon compound Magnesium Salicylate Sine-off  Ansaid Dasin Capsules Magsal Sodium Salicylate  Anturane Depen Capsules Marnal Soma  APF Arthritis pain formula Dewitt's Pills Measurin Stanback  Argesic Dia-Gesic Meclofenamic Sulfinpyrazone  Arthritis Bayer Timed Release Aspirin Diclofenac Meclomen Sulindac  Arthritis pain formula Anacin Dicumarol Medipren Supac  Analgesic (Safety coated) Arthralgen Diffunasal Mefanamic Suprofen  Arthritis Strength Bufferin Dihydrocodeine Mepro Compound Suprol  Arthropan liquid Dopirydamole Methcarbomol with Aspirin Synalgos  ASA tablets/Enseals Disalcid Micrainin Tagament  Ascriptin Doan's Midol Talwin  Ascriptin A/D Dolene Mobidin Tanderil  Ascriptin Extra Strength Dolobid Moblgesic Ticlid  Ascriptin with Codeine Doloprin or Doloprin with Codeine Momentum Tolectin  Asperbuf Duoprin Mono-gesic Trendar  Aspergum Duradyne Motrin or Motrin IB Triminicin  Aspirin plain, buffered or enteric coated Durasal Myochrisine Trigesic   Aspirin Suppositories Easprin Nalfon Trillsate  Aspirin with Codeine Ecotrin Regular or Extra Strength Naprosyn Uracel  Atromid-S Efficin Naproxen Ursinus  Auranofin Capsules Elmiron Neocylate Vanquish  Axotal Emagrin Norgesic Verin  Azathioprine Empirin or Empirin with Codeine Normiflo Vitamin E  Azolid Emprazil Nuprin Voltaren  Bayer Aspirin plain, buffered or children's or timed BC Tablets or powders Encaprin Orgaran Warfarin Sodium  Buff-a-Comp Enoxaparin Orudis Zorpin  Buff-a-Comp with Codeine Equegesic Os-Cal-Gesic   Buffaprin Excedrin plain, buffered or Extra Strength Oxalid   Bufferin Arthritis Strength Feldene Oxphenbutazone   Bufferin plain or Extra Strength Feldene Capsules Oxycodone with Aspirin   Bufferin with Codeine Fenoprofen Fenoprofen Pabalate or Pabalate-SF   Buffets II Flogesic Panagesic   Buffinol plain or Extra Strength Florinal or Florinal with Codeine Panwarfarin   Buf-Tabs Flurbiprofen Penicillamine   Butalbital Compound Four-way cold tablets Penicillin   Butazolidin Fragmin Pepto-Bismol   Carbenicillin Geminisyn Percodan   Carna Arthritis Reliever Geopen Persantine   Carprofen Gold's salt Persistin   Chloramphenicol Goody's Phenylbutazone   Chloromycetin Haltrain Piroxlcam   Clmetidine heparin Plaquenil   Cllnoril Hyco-pap Ponstel   Clofibrate Hydroxy chloroquine Propoxyphen         Before stopping any of these medications, be sure to consult the physician who ordered them.  Some, such as Coumadin (Warfarin) are ordered to prevent or treat serious conditions such as "deep thrombosis", "pumonary embolisms", and other heart problems.  The amount of time that you may need off of the medication may also vary with the medication and the reason for which you were taking it.  If you are taking any of these medications, please make sure you notify your pain physician before you undergo any procedures.   Epidural Steroid Injection Patient  Information  Description: The epidural space surrounds the nerves as they exit the spinal cord.  In some patients, the nerves can be compressed and inflamed by a bulging disc or a tight spinal canal (spinal stenosis).  By injecting steroids into the epidural space, we can bring irritated nerves into direct contact with a potentially helpful medication.  These steroids act directly on the irritated nerves and can reduce swelling and inflammation which often leads to decreased pain.  Epidural steroids may be injected anywhere along the spine and from the neck to the low back depending upon the location of your pain.   After numbing the skin with local anesthetic (like Novocaine), a small needle is passed into the epidural space slowly.  You may experience a sensation of pressure while this is being done.  The entire block usually last  less than 10 minutes.  Conditions which may be treated by epidural steroids:   Low back and leg pain  Neck and arm pain  Spinal stenosis  Post-laminectomy syndrome  Herpes zoster (shingles) pain  Pain from compression fractures  Preparation for the injection:  1. Do not eat any solid food or dairy products within 8 hours of your appointment.  2. You may drink clear liquids up to 3 hours before appointment.  Clear liquids include water, black coffee, juice or soda.  No milk or cream please. 3. You may take your regular medication, including pain medications, with a sip of water before your appointment  Diabetics should hold regular insulin (if taken separately) and take 1/2 normal NPH dos the morning of the procedure.  Carry some sugar containing items with you to your appointment. 4. A driver must accompany you and be prepared to drive you home after your procedure.  5. Bring all your current medications with your. 6. An IV may be inserted and sedation may be given at the discretion of the physician.   7. A blood pressure cuff, EKG and other monitors will  often be applied during the procedure.  Some patients may need to have extra oxygen administered for a short period. 8. You will be asked to provide medical information, including your allergies, prior to the procedure.  We must know immediately if you are taking blood thinners (like Coumadin/Warfarin)  Or if you are allergic to IV iodine contrast (dye). We must know if you could possible be pregnant.  Possible side-effects:  Bleeding from needle site  Infection (rare, may require surgery)  Nerve injury (rare)  Numbness & tingling (temporary)  Difficulty urinating (rare, temporary)  Spinal headache ( a headache worse with upright posture)  Light -headedness (temporary)  Pain at injection site (several days)  Decreased blood pressure (temporary)  Weakness in arm/leg (temporary)  Pressure sensation in back/neck (temporary)  Call if you experience:  Fever/chills associated with headache or increased back/neck pain.  Headache worsened by an upright position.  New onset weakness or numbness of an extremity below the injection site  Hives or difficulty breathing (go to the emergency room)  Inflammation or drainage at the infection site  Severe back/neck pain  Any new symptoms which are concerning to you  Please note:  Although the local anesthetic injected can often make your back or neck feel good for several hours after the injection, the pain will likely return.  It takes 3-7 days for steroids to work in the epidural space.  You may not notice any pain relief for at least that one week.  If effective, we will often do a series of three injections spaced 3-6 weeks apart to maximally decrease your pain.  After the initial series, we generally will wait several months before considering a repeat injection of the same type.  If you have any questions, please call [phone] [facility]

## 2018-12-16 NOTE — Progress Notes (Signed)
Safety precautions to be maintained throughout the outpatient stay will include: orient to surroundings, keep bed in low position, maintain call bell within reach at all times, provide assistance with transfer out of bed and ambulation.  

## 2018-12-27 ENCOUNTER — Ambulatory Visit
Admission: RE | Admit: 2018-12-27 | Discharge: 2018-12-27 | Disposition: A | Discharge status: Home / Self Care | Payer: PRIVATE HEALTH INSURANCE | Source: Ambulatory Visit | Attending: Student in an Organized Health Care Education/Training Program | Admitting: Student in an Organized Health Care Education/Training Program

## 2018-12-27 ENCOUNTER — Telehealth: Payer: Self-pay | Admitting: *Deleted

## 2018-12-27 ENCOUNTER — Other Ambulatory Visit: Payer: Self-pay

## 2018-12-27 ENCOUNTER — Ambulatory Visit (HOSPITAL_BASED_OUTPATIENT_CLINIC_OR_DEPARTMENT_OTHER): Payer: PRIVATE HEALTH INSURANCE | Admitting: Student in an Organized Health Care Education/Training Program

## 2018-12-27 ENCOUNTER — Encounter: Payer: Self-pay | Admitting: Student in an Organized Health Care Education/Training Program

## 2018-12-27 VITALS — BP 110/68 | HR 79 | Resp 18 | Ht 66.0 in | Wt 130.0 lb

## 2018-12-27 DIAGNOSIS — G894 Chronic pain syndrome: Secondary | ICD-10-CM | POA: Diagnosis present

## 2018-12-27 DIAGNOSIS — M5416 Radiculopathy, lumbar region: Secondary | ICD-10-CM | POA: Diagnosis not present

## 2018-12-27 MED ORDER — DEXAMETHASONE SODIUM PHOSPHATE 10 MG/ML IJ SOLN
10.0000 mg | Freq: Once | INTRAMUSCULAR | Status: AC
  Administered 2018-12-27: 10 mg
  Filled 2018-12-27: qty 1

## 2018-12-27 MED ORDER — SODIUM CHLORIDE 0.9% FLUSH
2.0000 mL | Freq: Once | INTRAVENOUS | Status: AC
  Administered 2018-12-27: 10 mL

## 2018-12-27 MED ORDER — ROPIVACAINE HCL 2 MG/ML IJ SOLN
2.0000 mL | Freq: Once | INTRAMUSCULAR | Status: AC
  Administered 2018-12-27: 10 mL via EPIDURAL
  Filled 2018-12-27: qty 10

## 2018-12-27 MED ORDER — LIDOCAINE HCL 2 % IJ SOLN
10.0000 mL | Freq: Once | INTRAMUSCULAR | Status: AC
  Administered 2018-12-27: 400 mg
  Filled 2018-12-27: qty 20

## 2018-12-27 MED ORDER — IOPAMIDOL (ISOVUE-M 200) INJECTION 41%
10.0000 mL | Freq: Once | INTRAMUSCULAR | Status: AC
  Administered 2018-12-27: 10 mL via EPIDURAL
  Filled 2018-12-27: qty 10

## 2018-12-27 MED ORDER — HYDROCODONE-ACETAMINOPHEN 7.5-325 MG PO TABS: 1.0000 | ORAL_TABLET | Freq: Two times a day (BID) | ORAL | 0 refills | Status: AC | PRN

## 2018-12-27 NOTE — Telephone Encounter (Signed)
Spoke with patient, reviewed the medication sent today.  No lyrica because she is currently taking Gabapentin.  Patient verbalizes u/o information.

## 2018-12-27 NOTE — Patient Instructions (Signed)
A prescription for Lyrica has been sent to your pharmacy. ____________________________________________________________________________________________  Post-procedure Information What to expect: Most procedures involve the use of a local anesthetic (numbing medicine), and a steroid (anti-inflammatory medicine).  The local anesthetics may cause temporary numbness and weakness of the legs or arms, depending on the location of the block. This numbness/weakness may last 4-6 hours, depending on the local anesthetic used. In rare instances, it can last up to 24 hours. While numb, you must be very careful not to injure the extremity.  After any procedure, you could expect the pain to get better within 15-20 minutes. This relief is temporary and may last 4-6 hours. Once the local anesthetics wears off, you could experience discomfort, possibly more than usual, for up to 10 (ten) days. In the case of radiofrequencies, it may last up to 6 weeks. Surgeries may take up to 8 weeks for the healing process. The discomfort is due to the irritation caused by needles going through skin and muscle. To minimize the discomfort, we recommend using ice the first day, and heat from then on. The ice should be applied for 15 minutes on, and 15 minutes off. Keep repeating this cycle until bedtime. Avoid applying the ice directly to the skin, to prevent frostbite. Heat should be used daily, until the pain improves (4-10 days). Be careful not to burn yourself.  Occasionally you may experience muscle spasms or cramps. These occur as a consequence of the irritation caused by the needle sticks to the muscle and the blood that will inevitably be lost into the surrounding muscle tissue. Blood tends to be very irritating to tissues, which tend to react by going into spasm. These spasms may start the same day of your procedure, but they may also take days to develop. This late onset type of spasm or cramp is usually caused by electrolyte  imbalances triggered by the steroids, at the level of the kidney. Cramps and spasms tend to respond well to muscle relaxants, multivitamins (some are triggered by the procedure, but may have their origins in vitamin deficiencies), and "Gatorade", or any sports drinks that can replenish any electrolyte imbalances. (If you are a diabetic, ask your pharmacist to get you a sugar-free brand.) Warm showers or baths may also be helpful. Stretching exercises are highly recommended.  General Instructions:  Be alert for signs of possible infection: redness, swelling, heat, red streaks, elevated temperature, and/or fever. These typically appear 4 to 6 days after the procedure. Immediately notify your doctor if you experience unusual bleeding, difficulty breathing, or loss of bowel or bladder control. If you experience increased pain, do not increase your pain medicine intake, unless instructed by your pain physician.  Post-Procedure Care:  Be careful in moving about. Muscle spasms in the area of the injection may occur. Applying ice or heat to the area is often helpful. The incidence of spinal headaches after epidural injections ranges between 1.4% and 6%. If you develop a headache that does not seem to respond to conservative therapy, please let your physician know. This can be treated with an epidural blood patch.   Post-procedure numbness or redness is to be expected, however it should average 4 to 6 hours. If numbness and weakness of your extremities begins to develop 4 to 6 hours after your procedure, and is felt to be progressing and worsening, immediately contact your physician.  Diet:  If you experience nausea, do not eat until this sensation goes away. If you had a "Stellate Ganglion Block"  for upper extremity "Reflex Sympathetic Dystrophy", do not eat or drink until your hoarseness goes away. In any case, always start with liquids first and if you tolerate them well, then slowly progress to more solid  foods.  Activity:  For the first 4 to 6 hours after the procedure, use caution in moving about as you may experience numbness and/or weakness. Use caution in cooking, using household electrical appliances, and climbing steps. If you need to reach your Doctor call our office: [phone] (During business hours) or [phone] (After business hours).  Business Hours: Monday-Thursday 8:00 am - 4:00 PM    Fridays: Closed     In case of an emergency: In case of emergency, call 911 or go to the nearest emergency room and have the physician there call us.  Interpretation of Procedure Every nerve block has two components: a diagnostic component, and a treatment component. Unrealistic expectations are the most common causes of "perceived failure".  In a perfect world, a single nerve block should be able to completely and permanently eliminate the pain. Sadly, the world is not perfect.  Most pain management nerve blocks are performed using local anesthetics and steroids. Steroids are responsible for any long-term benefit that you may experience. Their purpose is to decrease any chronic swelling that may exist in the area. Steroids begin to work immediately after being injected. However, most patients will not experience any benefits until 5 to 10 days after the injection, when the swelling has come down to the point where they can tell a difference. Steroids will only help if there is swelling to be treated. As such, they can assist with the diagnosis. If effective, they suggest an inflammatory component to the pain, and if ineffective, they rule out inflammation as the main cause or component of the problem. If the problem is one of mechanical compression, you will get no benefit from those steroids.   In the case of local anesthetics, they have a crucial role in the diagnosis of your condition. Most will begin to work within15 to 20 minutes after injection. The duration will depend on the type used  (short- vs. Long-acting). It is of outmost importance that patients keep tract of their pain, after the procedure. To assist with this matter, a "Post-procedure Pain Diary" is provided. Make sure to complete it and to bring it back to your follow-up appointment.  As long as the patient keeps accurate, detailed records of their symptoms after every procedure, and returns to have those interpreted, every procedure will provide [place] with invaluable information. Even a block that does not provide the patient with any relief, will always provide [place] with information about the mechanism and the origin of the pain. The only time a nerve block can be considered a waste of time is when patients do not keep track of the results, or do not keep their post-procedure appointment.  Reporting the results back to your physician The Pain Score  Pain is a subjective complaint. It cannot be seen, touched, or measured. We depend entirely on the patient's report of the pain in order to assess your condition and treatment. To evaluate the pain, we use a pain scale, where "0" means "No Pain", and a "10" is "the worst possible pain that you can even imagine" (i.e. something like been eaten alive by a shark or being torn apart by a lion).   Use the Pain Scale provided. You will frequently be asked to rate your pain. Please be  accurate, remember that medical decisions will be based on your responses. Please do not rate your pain above a 10. Doing so is actually interpreted as "symptom magnification" (exaggeration). To put this into perspective, when you tell us that your pain is at a 10 (ten), what you are saying is that there is nothing we can do to make this pain any worse. (Carefully think about that.) ____________________________________________________________________________________________   Pain Management Discharge Instructions  General Discharge Instructions :  If you need to reach your doctor call: Monday-Friday 8:00  am - 4:00 pm at [phone] or toll free [phone].  After clinic hours [phone] to have operator reach doctor.  Bring all of your medication bottles to all your appointments in the pain clinic.  To cancel or reschedule your appointment with Pain Management please remember to call 24 hours in advance to avoid a fee.  Refer to the educational materials which you have been given on: General Risks, I had my Procedure. Discharge Instructions, Post Sedation.  Post Procedure Instructions:  Please notify your doctor immediately if you have any unusual bleeding, trouble breathing or pain that is not related to your normal pain.  Depending on the type of procedure that was done, some parts of your body may feel week and/or numb.  This usually clears up by tonight or the next day.  Walk with the use of an assistive device or accompanied by an adult for the 24 hours.  You may use ice on the affected area for the first 24 hours.  Put ice in a Ziploc bag and cover with a towel and place against area 15 minutes on 15 minutes off.  You may switch to heat after 24 hours.

## 2018-12-27 NOTE — Progress Notes (Signed)
Patient's Name: Teresa [name]  MRN: [id number]  Referring Provider: Gillis Santa, MD  DOB: 11-23-1955  PCP: Tracie Harrier, MD  DOS: 12/27/2018  Note by: Gillis Santa, MD  Service setting: Ambulatory outpatient  Specialty: Interventional Pain Management  Patient type: Established  Location: [facility] (AMB) Pain Management Facility  Visit type: Interventional Procedure   Primary Reason for Visit: Interventional Pain Management Treatment. CC: Back Pain (low)  Procedure:          Anesthesia, Analgesia, Anxiolysis:  Type: Therapeutic Inter-Laminar Epidural Steroid Injection  #1  Region: Lumbar Level: L4-5 Level. Laterality: Right          Local Anesthetic: Lidocaine 1-2%  Position: Prone with head of the table was raised to facilitate breathing.   Indications: 1. Chronic pain syndrome   2. Lumbar radiculopathy (right L5)    Pain Score: Pre-procedure: 5 /10 Post-procedure: 3 /10  Pre-op Assessment:  Teresa [name] is a 63 y.o. (year old), female patient, seen today for interventional treatment. She  has a past surgical history that includes Abdominal hysterectomy; Colonoscopy (2000); Eye surgery (Left, 2014); and Artery Biopsy (Left, 12/18/2015). Teresa [name] has a current medication list which includes the following prescription(s): alendronate, calcium, cholecalciferol, diclofenac, ezetimibe, ferrous gluconate, flector, gabapentin, magnesium oxide, multivitamin, nortriptyline, topiramate, trazodone, zolpidem, hydrocodone-acetaminophen, and propranolol. Her primarily concern today is the Back Pain (low)  Initial Vital Signs:  Pulse/HCG Rate: 79ECG Heart Rate: 70 Temp:   Resp: 16 BP: 130/72 SpO2: 100 %  BMI: Estimated body mass index is 20.98 kg/m as calculated from the following:   Height as of this encounter: 5\' 6"  (1.676 m).   Weight as of this encounter: 130 lb (59 kg).  Risk Assessment: Allergies: Reviewed. She is allergic to codeine and morphine.  Allergy Precautions: None  required Coagulopathies: Reviewed. None identified.  Blood-thinner therapy: None at this time Active Infection(s): Reviewed. None identified. Teresa [name] is afebrile  Site Confirmation: Teresa [name] was asked to confirm the procedure and laterality before marking the site Procedure checklist: Completed Consent: Before the procedure and under the influence of no sedative(s), amnesic(s), or anxiolytics, the patient was informed of the treatment options, risks and possible complications. To fulfill our ethical and legal obligations, as recommended by the American Medical Association's Code of Ethics, I have informed the patient of my clinical impression; the nature and purpose of the treatment or procedure; the risks, benefits, and possible complications of the intervention; the alternatives, including doing nothing; the risk(s) and benefit(s) of the alternative treatment(s) or procedure(s); and the risk(s) and benefit(s) of doing nothing. The patient was provided information about the general risks and possible complications associated with the procedure. These may include, but are not limited to: failure to achieve desired goals, infection, bleeding, organ or nerve damage, allergic reactions, paralysis, and death. In addition, the patient was informed of those risks and complications associated to Spine-related procedures, such as failure to decrease pain; infection (i.e.: Meningitis, epidural or intraspinal abscess); bleeding (i.e.: epidural hematoma, subarachnoid hemorrhage, or any other type of intraspinal or peri-dural bleeding); organ or nerve damage (i.e.: Any type of peripheral nerve, nerve root, or spinal cord injury) with subsequent damage to sensory, motor, and/or autonomic systems, resulting in permanent pain, numbness, and/or weakness of one or several areas of the body; allergic reactions; (i.e.: anaphylactic reaction); and/or death. Furthermore, the patient was informed of those risks and  complications associated with the medications. These include, but are not limited to: allergic reactions (i.e.: anaphylactic or anaphylactoid  reaction(s)); adrenal axis suppression; blood sugar elevation that in diabetics may result in ketoacidosis or comma; water retention that in patients with history of congestive heart failure may result in shortness of breath, pulmonary edema, and decompensation with resultant heart failure; weight gain; swelling or edema; medication-induced neural toxicity; particulate matter embolism and blood vessel occlusion with resultant organ, and/or nervous system infarction; and/or aseptic necrosis of one or more joints. Finally, the patient was informed that Medicine is not an exact science; therefore, there is also the possibility of unforeseen or unpredictable risks and/or possible complications that may result in a catastrophic outcome. The patient indicated having understood very clearly. We have given the patient no guarantees and we have made no promises. Enough time was given to the patient to ask questions, all of which were answered to the patient's satisfaction. Teresa [name] has indicated that she wanted to continue with the procedure. Attestation: I, the ordering provider, attest that I have discussed with the patient the benefits, risks, side-effects, alternatives, likelihood of achieving goals, and potential problems during recovery for the procedure that I have provided informed consent. Date  Time: 12/27/2018  9:33 AM  Pre-Procedure Preparation:  Monitoring: As per clinic protocol. Respiration, ETCO2, SpO2, BP, heart rate and rhythm monitor placed and checked for adequate function Safety Precautions: Patient was assessed for positional comfort and pressure points before starting the procedure. Time-out: I initiated and conducted the "Time-out" before starting the procedure, as per protocol. The patient was asked to participate by confirming the accuracy of the  "Time Out" information. Verification of the correct person, site, and procedure were performed and confirmed by me, the nursing staff, and the patient. "Time-out" conducted as per Joint Commission's Universal Protocol (UP.01.01.01). Time: 1037  Description of Procedure:          Target Area: The interlaminar space, initially targeting the lower laminar border of the superior vertebral body. Approach: Paramedial approach. Area Prepped: Entire Posterior Lumbar Region Prepping solution: ChloraPrep (2% chlorhexidine gluconate and 70% isopropyl alcohol) Safety Precautions: Aspiration looking for blood return was conducted prior to all injections. At no point did we inject any substances, as a needle was being advanced. No attempts were made at seeking any paresthesias. Safe injection practices and needle disposal techniques used. Medications properly checked for expiration dates. SDV (single dose vial) medications used. Description of the Procedure: Protocol guidelines were followed. The procedure needle was introduced through the skin, ipsilateral to the reported pain, and advanced to the target area. Bone was contacted and the needle walked caudad, until the lamina was cleared. The epidural space was identified using "loss-of-resistance technique" with 2-3 ml of PF-NaCl (0.9% NSS), in a 5cc LOR glass syringe.  Vitals:   12/27/18 0946 12/27/18 1040 12/27/18 1046  BP: 130/72 123/69 110/68  Pulse: 79    Resp: 16 14 18   SpO2: 100% 99% 99%  Weight: 130 lb (59 kg)    Height: 5\' 6"  (1.676 m)      Start Time: 1037 hrs. End Time: 1042 hrs.  Materials:  Needle(s) Type: Epidural needle Gauge: 17G Length: 3.5-in Medication(s): Please see orders for medications and dosing details.  8 cc solution consisting of 5 cc of preservative-free saline, 2 cc of 0.2% ropivacaine, 1 cc of Decadron 10 mg/cc.  Imaging Guidance (Spinal):          Type of Imaging Technique: Fluoroscopy Guidance  (Spinal) Indication(s): Assistance in needle guidance and placement for procedures requiring needle placement in or near specific anatomical locations  not easily accessible without such assistance. Exposure Time: Please see nurses notes. Contrast: Before injecting any contrast, we confirmed that the patient did not have an allergy to iodine, shellfish, or radiological contrast. Once satisfactory needle placement was completed at the desired level, radiological contrast was injected. Contrast injected under live fluoroscopy. No contrast complications. See chart for type and volume of contrast used. Fluoroscopic Guidance: I was personally present during the use of fluoroscopy. "Tunnel Vision Technique" used to obtain the best possible view of the target area. Parallax error corrected before commencing the procedure. "Direction-depth-direction" technique used to introduce the needle under continuous pulsed fluoroscopy. Once target was reached, antero-posterior, oblique, and lateral fluoroscopic projection used confirm needle placement in all planes. Images permanently stored in EMR. Interpretation: I personally interpreted the imaging intraoperatively. Adequate needle placement confirmed in multiple planes. Appropriate spread of contrast into desired area was observed. No evidence of afferent or efferent intravascular uptake. No intrathecal or subarachnoid spread observed. Permanent images saved into the patient's record.  Antibiotic Prophylaxis:   Anti-infectives (From admission, onward)   None     Indication(s): None identified  Post-operative Assessment:  Post-procedure Vital Signs:  Pulse/HCG Rate: 7983 Temp:   Resp: 18 BP: 110/68 SpO2: 99 %  EBL: None  Complications: No immediate post-treatment complications observed by team, or reported by patient.  Note: The patient tolerated the entire procedure well. A repeat set of vitals were taken after the procedure and the patient was kept under  observation following institutional policy, for this type of procedure. Post-procedural neurological assessment was performed, showing return to baseline, prior to discharge. The patient was provided with post-procedure discharge instructions, including a section on how to identify potential problems. Should any problems arise concerning this procedure, the patient was given instructions to immediately contact us, at any time, without hesitation. In any case, we plan to contact the patient by telephone for a follow-up status report regarding this interventional procedure.  Comments:  No additional relevant information.   5 out of 5 strength bilateral lower extremity: Plantar flexion, dorsiflexion, knee flexion, knee extension.   Plan of Care    Imaging Orders     DG C-Arm 1-60 Min-No Report Procedure Orders    No procedure(s) ordered today   Patient was also given prescription for hydrocodone as below.  Will obtain urine drug screen today.  Patient has tried tramadol in the past which she states was not effective.  She was taken 100 mg twice daily to 3 times daily as needed.  [place] PMP checked and appropriate.  No recent fills of any opioid medications.  Medications ordered for procedure: Meds ordered this encounter  Medications  . iopamidol (ISOVUE-M) 41 % intrathecal injection 10 mL  . ropivacaine (PF) 2 mg/mL (0.2%) (NAROPIN) injection 2 mL  . sodium chloride flush (NS) 0.9 % injection 2 mL  . lidocaine (XYLOCAINE) 2 % (with pres) injection 200 mg  . dexamethasone (DECADRON) injection 10 mg  . HYDROcodone-acetaminophen (NORCO) 7.5-325 MG tablet    Sig: Take 1 tablet by mouth 2 (two) times daily as needed for up to 15 days for moderate pain.    Dispense:  30 tablet    Refill:  0    Do not place this medication, or any other prescription from our practice, on "Automatic Refill". Patient may have prescription filled one day early if pharmacy is closed on scheduled refill date.    Medications administered: We administered iopamidol, ropivacaine (PF) 2 mg/mL (0.2%), sodium chloride flush,  lidocaine, and dexamethasone.  See the medical record for exact dosing, route, and time of administration.  Disposition: Discharge home  Discharge Date & Time: 12/27/2018; 1050 hrs.   Physician-requested Follow-up: Return in about 4 weeks (around 01/24/2019) for Post Procedure Evaluation.  Future Appointments  Date Time Provider [place]  01/25/2019  8:45 AM Gillis Santa, MD [facility] None   Primary Care Physician: Tracie Harrier, MD Location: [facility] Facility Note by: Gillis Santa, MD Date: 12/27/2018; Time: 2:00 PM  Disclaimer:  Medicine is not an exact science. The only guarantee in medicine is that nothing is guaranteed. It is important to note that the decision to proceed with this intervention was based on the information collected from the patient. The Data and conclusions were drawn from the patient's questionnaire, the interview, and the physical examination. Because the information was provided in large part by the patient, it cannot be guaranteed that it has not been purposely or unconsciously manipulated. Every effort has been made to obtain as much relevant data as possible for this evaluation. It is important to note that the conclusions that lead to this procedure are derived in large part from the available data. Always take into account that the treatment will also be dependent on availability of resources and existing treatment guidelines, considered by other Pain Management Practitioners as being common knowledge and practice, at the time of the intervention. For Medico-Legal purposes, it is also important to point out that variation in procedural techniques and pharmacological choices are the acceptable norm. The indications, contraindications, technique, and results of the above procedure should only be interpreted and judged by a  Board-Certified Interventional Pain Specialist with extensive familiarity and expertise in the same exact procedure and technique.

## 2018-12-27 NOTE — Progress Notes (Signed)
Safety precautions to be maintained throughout the outpatient stay will include: orient to surroundings, keep bed in low position, maintain call bell within reach at all times, provide assistance with transfer out of bed and ambulation.  

## 2018-12-27 NOTE — Telephone Encounter (Signed)
PT referral has already been placed. No prescription for Lyrica as patient is on gabapentin. Prescription for hydrocodone sent in

## 2018-12-27 NOTE — Telephone Encounter (Signed)
Patient called inquiring of the status of PT referral.

## 2018-12-28 ENCOUNTER — Telehealth: Payer: Self-pay

## 2018-12-28 NOTE — Telephone Encounter (Signed)
Patient states she had a bad night and could not sleep well. Put ice on it yesterday and will use heat today. "I just got up and walked some and it is getting better". Instructed to call if needed.

## 2018-12-30 LAB — COMPLIANCE DRUG ANALYSIS, UR

## 2019-01-05 ENCOUNTER — Telehealth: Payer: Self-pay | Admitting: Student in an Organized Health Care Education/Training Program

## 2019-01-05 NOTE — Telephone Encounter (Addendum)
Patient called on 01-05-19 stating she has only gotten a small amount of relief from procedure. She would like to come in sooner than her 01-25-19 appt to discuss options to obtain a greater degree of pain relief. The only times you have open are on procedure days. Please advise. Patient wants to get results of UDS.

## 2019-01-10 ENCOUNTER — Ambulatory Visit
Payer: PRIVATE HEALTH INSURANCE | Attending: Student in an Organized Health Care Education/Training Program | Admitting: Physical Therapy

## 2019-01-10 ENCOUNTER — Encounter: Payer: Self-pay | Admitting: Physical Therapy

## 2019-01-10 DIAGNOSIS — M5416 Radiculopathy, lumbar region: Secondary | ICD-10-CM | POA: Insufficient documentation

## 2019-01-10 NOTE — Telephone Encounter (Signed)
Please schedule per Dr Holley Raring

## 2019-01-10 NOTE — Therapy (Signed)
[facility] [address], [place], [address] Phone: [phone]   Fax:  [phone]  Physical Therapy Evaluation  Patient Details  Name: Teresa [name] MRN: [id number] Date of Birth: 18-Dec-1955 Referring Provider (PT): Lateef   Encounter Date: 01/10/2019  PT End of Session - 01/11/19 1638    Visit Number  1    Number of Visits  17    Date for PT Re-Evaluation  03/08/19    PT Start Time  1030    PT Stop Time  1142    PT Time Calculation (min)  72 min    Activity Tolerance  Patient tolerated treatment well    Behavior During Therapy  [facility] for tasks assessed/performed       Past Medical History:  Diagnosis Date  . Headache    migraines  . History of fractured vertebra   . Hyperlipidemia   . Low bone density   . Transfusion of blood product refused for religious reason     Past Surgical History:  Procedure Laterality Date  . ABDOMINAL HYSTERECTOMY    . ARTERY BIOPSY Left 12/18/2015   Procedure: BIOPSY TEMPORAL ARTERY;  Surgeon: Clyde Canterbury, MD;  Location: [place];  Service: ENT;  Laterality: Left;  . COLONOSCOPY  2000  . EYE SURGERY Left 2014    There were no vitals filed for this visit.   Subjective Assessment - 01/10/19 1036    Subjective  LBP; lumbar rediculopathy     Pertinent History  Pt is a 63 year old female reporting following chronic back pain over the past five years that she reports increased after standing up in December and "has not let up since". Patient reports pain is directly in center of spine and radiates down RLE. Patient reports pain in the center of the spine is a throbbing pain, and reports RLE pain is numbness. Patient reports numbness from R hip down into the toes, that comes and goes at random that is not d/t any particular motion and is not worse during any time of day. Patient reports her pain is worse with pressure against the spine like sitting in a chair  with a back. Reports increased pain with increased walking, bending/stooping, and any stimulation to the back. Reports pain is not too bad in the morning and is better after laying on her side. Patient reports she has had injections (3) beginning in Dec with minimal pain relief. Pt denies N/V, B&B changes, unexplained weight fluctuation, saddle paresthesia, fever, night sweats, or unrelenting night pain at this time.    Limitations  Sitting    How long can you sit comfortably?  Cannot sit with a chair with a back for any period of time    How long can you stand comfortably?  Unlimited    How long can you walk comfortably?  20 minutes    Diagnostic tests  MRI 11/23/17    Patient Stated Goals  Return to gardening and be able to take trip to [place] in July without pain    Currently in Pain?  Yes    Pain Location  Back    Pain Orientation  Posterior;Lower    Pain Descriptors / Indicators  Aching;Throbbing    Pain Type  Chronic pain    Pain Radiating Towards  RLE into toes with numbess    Pain Onset  More than a month ago    Pain Frequency  Constant    Aggravating  Factors   Bending/stopping, sitting with a chair back , all stimulation to back    Pain Relieving Factors  Laying on side    Effect of Pain on Daily Activities  Unable to complete household chores           OBJECTIVE  Mental Status Patient is oriented to person, place and time.  Recent memory is intact.  Remote memory is intact.  Attention span and concentration are intact.  Expressive speech is intact.  Patient's fund of knowledge is within normal limits for educational level.  SENSATION: Grossly intact to light touch bilateral L as determined by testing dermatomes L2-S2 Proprioception and hot/cold testing deferred on this date   MUSCULOSKELETAL: Tremor: None Bulk: Normal Tone: Normal   Posture Forward head, reduced lumbar lordosis TTP with withdrawal response to all stimulus at spine of L3-S2.   Gait Slight R hip  drop    Palpation TTP with increased tension noted at glute musculature with deep palpation over piriformis  Strength (out of 5) R/L 5/5 Hip flexion 4/4+ Hip ER 5/5 Hip IR 4-/4 Hip abduction 5/5 Hip adduction 4/4 Hip extension 5/5 Knee extension 5/5 Knee flexion 5/5 Ankle dorsiflexion *Indicates pain   AROM (degrees) R/L (all movements include overpressure unless otherwise stated) Lumbar forward flexion (0-65): Approx 40d with increased pain  Lumbar extension (0-30): WNL with patient reporting this decreases pain Lumbar lateral flexion (0-25): WNL bilat, no increased pain Lumbar rotation: WNL bilat, no increased pain Hip IR (0-45): R: L: WNL bilat, slight "tension" felt from patient at end range on R hip Hip ER (0-45): R: L: WNL bilat Hip Flexion (0-125): WNL bilat Hip Abduction (0-40): R: L: WNL bilat Hip extension (0-15): R: L: WNL bilat *Indicates pain  PROM (degrees) PROM = AROM with increased pain with lumbar flex overpressure  Repeated Movements Periphrealization with repeated lumbar flexion  Centralization of symptoms with repeated lumbar extension    Muscle Length Hamstrings: Negative bilat Ely: R: Positive bilat Ober: Negative bilat   Passive Accessory Intervertebral Motion (PAIVM) Pt denies reproduction of posterior hip pain with CPA L1-L5 and UPA bilaterally L1-L5; but reports increased sensitivity and sharp pain at area to deep touch, making mobility assessment difficult    SPECIAL TESTS Slump: R: Positive L:Negative SLR: R:Positive  Crossed SLR:  L: NEGATIVE FABER: Negative bilat FADIR: R: Positive L: Negative  Hip scour: Negative nilat Ely: Positive bilat Thomas: Negative bilat Ober: Negative bilat  Ther-Ex Prone on elbows (visual demonstration) Lumbar Ext x10 with cuing for ROM, good centralization noted. Education on x10 every hour  Piriformis stretch x30sec hold in sitting Education on diagnosis, anatomy involved and directional  preference  NeuroMuscular Re-Ed Educated patient on pain science and hypersensitivity, with information given on desensitization techniques beginning with touching area with non-threatening light touch and increasing pressure as able. Educated patient on brain body connection and the feedback loop between pain sensation and pain perception. Patient verbalized understanding of all provided education.                      Objective measurements completed on examination: See above findings.              PT Education - 01/10/19 1045    Education Details  Patient was educated on diagnosis, anatomy and pathology involved, prognosis, role of PT, and was given an HEP, demonstrating exercise with proper form following verbal and tactile cues, and was given a paper hand out to continue  exercise at home. Pt was educated on and agreed to plan of care.    Person(s) Educated  Patient    Methods  Explanation;Demonstration;Tactile cues;Verbal cues;Handout    Comprehension  Verbalized understanding;Verbal cues required;Need further instruction;Returned demonstration;Tactile cues required       PT Short Term Goals - 01/11/19 1603      PT SHORT TERM GOAL #1   Title  Pt will be independent with HEP in order to improve strength and decrease back pain in order to improve pain-free function at home and work.     Time  8    Period  Weeks    Status  New        PT Long Term Goals - 01/11/19 1603      PT LONG TERM GOAL #1   Title  Pt will decrease worst back pain as reported on NPRS by at least 2 points in order to demonstrate clinically significant reduction in back pain.     Baseline  01/10/19 7/10    Time  8    Period  Days    Status  New      PT LONG TERM GOAL #2   Title  Pt will decrease 5TSTS by at least 3 seconds in order to demonstrate clinically significant improvement in LE strength    Baseline  01/10/19 21sec    Time  8    Period  Weeks    Status  New      PT LONG  TERM GOAL #3   Title  Patient will increase FOTO score to 62 to demonstrate predicted increase in functional mobility to complete ADLs    Baseline  01/10/19 48    Time  8    Period  Weeks    Status  New             Plan - 01/11/19 1639    Clinical Impression Statement  Patient is a 63 year old female presenting with signs and symptoms of R sided lumbar radiculopathy. Patient with current limitations in bending/stooping, squatting, abnormal sensation over lumbar spine, glute muscle tension, and pain. 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 Would benefit from skilled PT to address above deficits and promote optimal return to PLOF    Personal Factors and Comorbidities  Age;Comorbidity 1;Comorbidity 2;Comorbidity 3+;Sex;Past/Current Experience;Time since onset of injury/illness/exacerbation    Examination-Activity Limitations  Lift;Dressing;Bend;Bathing;Sit;Sleep    Examination-Participation Restrictions  Cleaning;Community Activity;Laundry;Other;Yard Work    [employer]  Evolving/Moderate complexity    Clinical Decision Making  Moderate    Rehab Potential  Good    PT Frequency  2x / week    PT Duration  8 weeks    PT Treatment/Interventions  ADLs/Self Care Home Management;Cryotherapy;Electrical Stimulation;Moist Heat;Traction;Ultrasound;Gait Scientist, forensic;Therapeutic exercise;Passive range of motion;Taping;Neuromuscular re-education;Patient/family education;Manual techniques;Dry needling;Spinal Manipulations;Therapeutic activities;Functional mobility training    PT Next Visit Plan  HEP review    PT Home Exercise Plan  prone on elbows, standing lumbar ext, piriformis    Consulted and Agree with Plan of Care  Patient       Patient will benefit from skilled therapeutic intervention in order  to improve the following deficits and impairments:  Abnormal gait, Decreased activity tolerance, Decreased endurance, Decreased strength, Impaired sensation, Improper body mechanics, Pain, Postural dysfunction, Impaired flexibility, Decreased mobility  Visit Diagnosis: Radiculopathy,  lumbar region     Problem List Patient Active Problem List   Diagnosis Date Noted  . Lumbar radiculopathy (right L5) 12/16/2018  . Lumbar facet arthropathy (L3/4 and L4/5) 12/16/2018  . Degeneration of lumbar or lumbosacral intervertebral disc 12/16/2018  . Chronic pain syndrome 12/16/2018  . Iron deficiency anemia 01/24/2016  . Essential (primary) hypertension 09/04/2014  . Encounter for screening colonoscopy 10/20/2013  . INSOMNIA, CHRONIC 09/08/2007  . BACK PAIN, CHRONIC 09/08/2007  . DEPRESSION 08/25/2007  . MIGRAINE HEADACHE 08/25/2007  . GERD 08/25/2007  . Constipation 08/25/2007  . OSTEOPENIA 08/25/2007   Shelton Silvas PT, DPT Shelton Silvas 01/11/2019, 5:37 PM  [place] [facility] [address], [place], [address] Phone: [phone]   Fax:  [phone]  Name: Teresa [name] MRN: [id number] Date of Birth: 11/29/1955

## 2019-01-12 ENCOUNTER — Ambulatory Visit: Payer: PRIVATE HEALTH INSURANCE | Admitting: Physical Therapy

## 2019-01-17 ENCOUNTER — Ambulatory Visit: Payer: PRIVATE HEALTH INSURANCE | Admitting: Physical Therapy

## 2019-01-19 ENCOUNTER — Encounter: Payer: Self-pay | Admitting: Student in an Organized Health Care Education/Training Program

## 2019-01-19 ENCOUNTER — Other Ambulatory Visit: Payer: Self-pay

## 2019-01-19 ENCOUNTER — Ambulatory Visit: Payer: PRIVATE HEALTH INSURANCE | Admitting: Physical Therapy

## 2019-01-19 ENCOUNTER — Ambulatory Visit
Payer: PRIVATE HEALTH INSURANCE | Attending: Student in an Organized Health Care Education/Training Program | Admitting: Student in an Organized Health Care Education/Training Program

## 2019-01-19 VITALS — BP 136/75 | HR 95 | Temp 98.3°F | Resp 18 | Ht 66.0 in | Wt 130.0 lb

## 2019-01-19 DIAGNOSIS — G894 Chronic pain syndrome: Secondary | ICD-10-CM | POA: Diagnosis not present

## 2019-01-19 DIAGNOSIS — M5137 Other intervertebral disc degeneration, lumbosacral region: Secondary | ICD-10-CM | POA: Diagnosis not present

## 2019-01-19 DIAGNOSIS — M47816 Spondylosis without myelopathy or radiculopathy, lumbar region: Secondary | ICD-10-CM

## 2019-01-19 MED ORDER — HYDROCODONE-ACETAMINOPHEN 7.5-325 MG PO TABS: 1.0000 | ORAL_TABLET | Freq: Every day | ORAL | 0 refills | Status: DC | PRN

## 2019-01-19 NOTE — Progress Notes (Signed)
Nursing Pain Medication Assessment:  Safety precautions to be maintained throughout the outpatient stay will include: orient to surroundings, keep bed in low position, maintain call bell within reach at all times, provide assistance with transfer out of bed and ambulation.  Medication Inspection Compliance: Teresa [name] did not comply with our request to bring her pills to be counted. She was reminded that bringing the medication bottles, even when empty, is a requirement.  Medication: None brought in. Pill/Patch Count: None available to be counted. Bottle Appearance: No container available. Did not bring bottle(s) to appointment. Filled Date: N/A Last Medication intake:  01/14/2019

## 2019-01-19 NOTE — Patient Instructions (Signed)
GENERAL RISKS AND COMPLICATIONS  What are the risk, side effects and possible complications? Generally speaking, most procedures are safe.  However, with any procedure there are risks, side effects, and the possibility of complications.  The risks and complications are dependent upon the sites that are lesioned, or the type of nerve block to be performed.  The closer the procedure is to the spine, the more serious the risks are.  Great care is taken when placing the radio frequency needles, block needles or lesioning probes, but sometimes complications can occur. 1. Infection: Any time there is an injection through the skin, there is a risk of infection.  This is why sterile conditions are used for these blocks.  There are four possible types of infection. 1. Localized skin infection. 2. Central Nervous System Infection-This can be in the form of Meningitis, which can be deadly. 3. Epidural Infections-This can be in the form of an epidural abscess, which can cause pressure inside of the spine, causing compression of the spinal cord with subsequent paralysis. This would require an emergency surgery to decompress, and there are no guarantees that the patient would recover from the paralysis. 4. Discitis-This is an infection of the intervertebral discs.  It occurs in about 1% of discography procedures.  It is difficult to treat and it may lead to surgery.        2. Pain: the needles have to go through skin and soft tissues, will cause soreness.       3. Damage to internal structures:  The nerves to be lesioned may be near blood vessels or    other nerves which can be potentially damaged.       4. Bleeding: Bleeding is more common if the patient is taking blood thinners such as  aspirin, Coumadin, Ticiid, Plavix, etc., or if he/she have some genetic predisposition  such as hemophilia. Bleeding into the spinal canal can cause compression of the spinal  cord with subsequent paralysis.  This would require an  emergency surgery to  decompress and there are no guarantees that the patient would recover from the  paralysis.       5. Pneumothorax:  Puncturing of a lung is a possibility, every time a needle is introduced in  the area of the chest or upper back.  Pneumothorax refers to free air around the  collapsed lung(s), inside of the thoracic cavity (chest cavity).  Another two possible  complications related to a similar event would include: Hemothorax and Chylothorax.   These are variations of the Pneumothorax, where instead of air around the collapsed  lung(s), you may have blood or chyle, respectively.       6. Spinal headaches: They may occur with any procedures in the area of the spine.       7. Persistent CSF (Cerebro-Spinal Fluid) leakage: This is a rare problem, but may occur  with prolonged intrathecal or epidural catheters either due to the formation of a fistulous  track or a dural tear.       8. Nerve damage: By working so close to the spinal cord, there is always a possibility of  nerve damage, which could be as serious as a permanent spinal cord injury with  paralysis.       9. Death:  Although rare, severe deadly allergic reactions known as "Anaphylactic  reaction" can occur to any of the medications used.      10. Worsening of the symptoms:  We can always make thing worse.    What are the chances of something like this happening? Chances of any of this occuring are extremely low.  By statistics, you have more of a chance of getting killed in a motor vehicle accident: while driving to the hospital than any of the above occurring .  Nevertheless, you should be aware that they are possibilities.  In general, it is similar to taking a shower.  Everybody knows that you can slip, hit your head and get killed.  Does that mean that you should not shower again?  Nevertheless always keep in mind that statistics do not mean anything if you happen to be on the wrong side of them.  Even if a procedure has a 1  (one) in a 1,000,000 (million) chance of going wrong, it you happen to be that one..Also, keep in mind that by statistics, you have more of a chance of having something go wrong when taking medications.  Who should not have this procedure? If you are on a blood thinning medication (e.g. Coumadin, Plavix, see list of "Blood Thinners"), or if you have an active infection going on, you should not have the procedure.  If you are taking any blood thinners, please inform your physician.  How should I prepare for this procedure?  Do not eat or drink anything at least six hours prior to the procedure.  Bring a driver with you .  It cannot be a taxi.  Come accompanied by an adult that can drive you back, and that is strong enough to help you if your legs get weak or numb from the local anesthetic.  Take all of your medicines the morning of the procedure with just enough water to swallow them.  If you have diabetes, make sure that you are scheduled to have your procedure done first thing in the morning, whenever possible.  If you have diabetes, take only half of your insulin dose and notify our nurse that you have done so as soon as you arrive at the clinic.  If you are diabetic, but only take blood sugar pills (oral hypoglycemic), then do not take them on the morning of your procedure.  You may take them after you have had the procedure.  Do not take aspirin or any aspirin-containing medications, at least eleven (11) days prior to the procedure.  They may prolong bleeding.  Wear loose fitting clothing that may be easy to take off and that you would not mind if it got stained with Betadine or blood.  Do not wear any jewelry or perfume  Remove any nail coloring.  It will interfere with some of our monitoring equipment.  NOTE: Remember that this is not meant to be interpreted as a complete list of all possible complications.  Unforeseen problems may occur.  BLOOD THINNERS The following drugs  contain aspirin or other products, which can cause increased bleeding during surgery and should not be taken for 2 weeks prior to and 1 week after surgery.  If you should need take something for relief of minor pain, you may take acetaminophen which is found in Tylenol,m Datril, Anacin-3 and Panadol. It is not blood thinner. The products listed below are.  Do not take any of the products listed below in addition to any listed on your instruction sheet.  A.P.C or A.P.C with Codeine Codeine Phosphate Capsules #3 Ibuprofen Ridaura  ABC compound Congesprin Imuran rimadil  Advil Cope Indocin Robaxisal  Alka-Seltzer Effervescent Pain Reliever and Antacid Coricidin or Coricidin-D  Indomethacin Rufen    Alka-Seltzer plus Cold Medicine Cosprin Ketoprofen S-A-C Tablets  Anacin Analgesic Tablets or Capsules Coumadin Korlgesic Salflex  Anacin Extra Strength Analgesic tablets or capsules CP-2 Tablets Lanoril Salicylate  Anaprox Cuprimine Capsules Levenox Salocol  Anexsia-D Dalteparin Magan Salsalate  Anodynos Darvon compound Magnesium Salicylate Sine-off  Ansaid Dasin Capsules Magsal Sodium Salicylate  Anturane Depen Capsules Marnal Soma  APF Arthritis pain formula Dewitt's Pills Measurin Stanback  Argesic Dia-Gesic Meclofenamic Sulfinpyrazone  Arthritis Bayer Timed Release Aspirin Diclofenac Meclomen Sulindac  Arthritis pain formula Anacin Dicumarol Medipren Supac  Analgesic (Safety coated) Arthralgen Diffunasal Mefanamic Suprofen  Arthritis Strength Bufferin Dihydrocodeine Mepro Compound Suprol  Arthropan liquid Dopirydamole Methcarbomol with Aspirin Synalgos  ASA tablets/Enseals Disalcid Micrainin Tagament  Ascriptin Doan's Midol Talwin  Ascriptin A/D Dolene Mobidin Tanderil  Ascriptin Extra Strength Dolobid Moblgesic Ticlid  Ascriptin with Codeine Doloprin or Doloprin with Codeine Momentum Tolectin  Asperbuf Duoprin Mono-gesic Trendar  Aspergum Duradyne Motrin or Motrin IB Triminicin  Aspirin  plain, buffered or enteric coated Durasal Myochrisine Trigesic  Aspirin Suppositories Easprin Nalfon Trillsate  Aspirin with Codeine Ecotrin Regular or Extra Strength Naprosyn Uracel  Atromid-S Efficin Naproxen Ursinus  Auranofin Capsules Elmiron Neocylate Vanquish  Axotal Emagrin Norgesic Verin  Azathioprine Empirin or Empirin with Codeine Normiflo Vitamin E  Azolid Emprazil Nuprin Voltaren  Bayer Aspirin plain, buffered or children's or timed BC Tablets or powders Encaprin Orgaran Warfarin Sodium  Buff-a-Comp Enoxaparin Orudis Zorpin  Buff-a-Comp with Codeine Equegesic Os-Cal-Gesic   Buffaprin Excedrin plain, buffered or Extra Strength Oxalid   Bufferin Arthritis Strength Feldene Oxphenbutazone   Bufferin plain or Extra Strength Feldene Capsules Oxycodone with Aspirin   Bufferin with Codeine Fenoprofen Fenoprofen Pabalate or Pabalate-SF   Buffets II Flogesic Panagesic   Buffinol plain or Extra Strength Florinal or Florinal with Codeine Panwarfarin   Buf-Tabs Flurbiprofen Penicillamine   Butalbital Compound Four-way cold tablets Penicillin   Butazolidin Fragmin Pepto-Bismol   Carbenicillin Geminisyn Percodan   Carna Arthritis Reliever Geopen Persantine   Carprofen Gold's salt Persistin   Chloramphenicol Goody's Phenylbutazone   Chloromycetin Haltrain Piroxlcam   Clmetidine heparin Plaquenil   Cllnoril Hyco-pap Ponstel   Clofibrate Hydroxy chloroquine Propoxyphen         Before stopping any of these medications, be sure to consult the physician who ordered them.  Some, such as Coumadin (Warfarin) are ordered to prevent or treat serious conditions such as "deep thrombosis", "pumonary embolisms", and other heart problems.  The amount of time that you may need off of the medication may also vary with the medication and the reason for which you were taking it.  If you are taking any of these medications, please make sure you notify your pain physician before you undergo any  procedures.         Facet Blocks Patient Information  Description: The facets are joints in the spine between the vertebrae.  Like any joints in the body, facets can become irritated and painful.  Arthritis can also effect the facets.  By injecting steroids and local anesthetic in and around these joints, we can temporarily block the nerve supply to them.  Steroids act directly on irritated nerves and tissues to reduce selling and inflammation which often leads to decreased pain.  Facet blocks may be done anywhere along the spine from the neck to the low back depending upon the location of your pain.   After numbing the skin with local anesthetic (like Novocaine), a small needle is passed onto the facet joints under x-ray guidance.    You may experience a sensation of pressure while this is being done.  The entire block usually lasts about 15-25 minutes.   Conditions which may be treated by facet blocks:   Low back/buttock pain  Neck/shoulder pain  Certain types of headaches  Preparation for the injection:  1. Do not eat any solid food or dairy products within 8 hours of your appointment. 2. You may drink clear liquid up to 3 hours before appointment.  Clear liquids include water, black coffee, juice or soda.  No milk or cream please. 3. You may take your regular medication, including pain medications, with a sip of water before your appointment.  Diabetics should hold regular insulin (if taken separately) and take 1/2 normal NPH dose the morning of the procedure.  Carry some sugar containing items with you to your appointment. 4. A driver must accompany you and be prepared to drive you home after your procedure. 5. Bring all your current medications with you. 6. An IV may be inserted and sedation may be given at the discretion of the physician. 7. A blood pressure cuff, EKG and other monitors will often be applied during the procedure.  Some patients may need to have extra oxygen  administered for a short period. 8. You will be asked to provide medical information, including your allergies and medications, prior to the procedure.  We must know immediately if you are taking blood thinners (like Coumadin/Warfarin) or if you are allergic to IV iodine contrast (dye).  We must know if you could possible be pregnant.  Possible side-effects:   Bleeding from needle site  Infection (rare, may require surgery)  Nerve injury (rare)  Numbness & tingling (temporary)  Difficulty urinating (rare, temporary)  Spinal headache (a headache worse with upright posture)  Light-headedness (temporary)  Pain at injection site (serveral days)  Decreased blood pressure (rare, temporary)  Weakness in arm/leg (temporary)  Pressure sensation in back/neck (temporary)   Call if you experience:   Fever/chills associated with headache or increased back/neck pain  Headache worsened by an upright position  New onset, weakness or numbness of an extremity below the injection site  Hives or difficulty breathing (go to the emergency room)  Inflammation or drainage at the injection site(s)  Severe back/neck pain greater than usual  New symptoms which are concerning to you  Please note:  Although the local anesthetic injected can often make your back or neck feel good for several hours after the injection, the pain will likely return. It takes 3-7 days for steroids to work.  You may not notice any pain relief for at least one week.  If effective, we will often do a series of 2-3 injections spaced 3-6 weeks apart to maximally decrease your pain.  After the initial series, you may be a candidate for a more permanent nerve block of the facets.  If you have any questions, please call [phone] [facility] 

## 2019-01-19 NOTE — Progress Notes (Signed)
Patient's Name: Teresa [name]  MRN: [id number]  Referring Provider: Tracie Harrier, MD  DOB: 19-Oct-1956  PCP: Tracie Harrier, MD  DOS: 01/19/2019  Note by: Gillis Santa, MD  Service setting: Ambulatory outpatient  Specialty: Interventional Pain Management  Location: [facility] (AMB) Pain Management Facility    Patient type: Established   Primary Reason(s) for Visit: Encounter for post-procedure evaluation of chronic illness with mild to moderate exacerbation CC: Back Pain (low and more right)  HPI  Teresa [name] is a 63 y.o. year old, female patient, who comes today for a post-procedure evaluation. She has INSOMNIA, CHRONIC; DEPRESSION; MIGRAINE HEADACHE; GERD; Constipation; BACK PAIN, CHRONIC; OSTEOPENIA; Encounter for screening colonoscopy; Essential (primary) hypertension; Iron deficiency anemia; Lumbar radiculopathy (right L5); Lumbar facet arthropathy (L3/4 and L4/5); Degeneration of lumbar or lumbosacral intervertebral disc; and Chronic pain syndrome on their problem list. Her primarily concern today is the Back Pain (low and more right)  Pain Assessment: Location: Lower Back Radiating: numbness in toes Onset: More than a month ago Duration: Chronic pain Quality: Constant, Burning(stinging) Severity: 4 /10 (subjective, self-reported pain score)  Note: Reported level is compatible with observation.                         When using our objective Pain Scale, levels between 6 and 10/10 are said to belong in an emergency room, as it progressively worsens from a 6/10, described as severely limiting, requiring emergency care not usually available at an outpatient pain management facility. At a 6/10 level, communication becomes difficult and requires great effort. Assistance to reach the emergency department may be required. Facial flushing and profuse sweating along with potentially dangerous increases in heart rate and blood pressure will be evident. Effect on ADL:   Timing: Constant Modifying  factors: positioning, medications BP: 136/75  HR: 95  Teresa [name] comes in today for post-procedure evaluation.  Further details on both, my assessment(s), as well as the proposed treatment plan, please see below.  Post-Procedure Assessment  01/05/2019 Procedure: Right L4-L5 ESI Pre-procedure pain score:  5/10 Post-procedure pain score: 3/10         Influential Factors: BMI: 20.98 kg/m Intra-procedural challenges: None observed.         Assessment challenges: None detected.              Reported side-effects: None.        Post-procedural adverse reactions or complications: None reported         Sedation: Please see nurses note. When no sedatives are used, the analgesic levels obtained are directly associated to the effectiveness of the local anesthetics. However, when sedation is provided, the level of analgesia obtained during the initial 1 hour following the intervention, is believed to be the result of a combination of factors. These factors may include, but are not limited to: 1. The effectiveness of the local anesthetics used. 2. The effects of the analgesic(s) and/or anxiolytic(s) used. 3. The degree of discomfort experienced by the patient at the time of the procedure. 4. The patients ability and reliability in recalling and recording the events. 5. The presence and influence of possible secondary gains and/or psychosocial factors. Reported result: Relief experienced during the 1st hour after the procedure:100% (Ultra-Short Term Relief)            Interpretative annotation: Clinically appropriate result. Analgesia during this period is likely to be Local Anesthetic and/or IV Sedative (Analgesic/Anxiolytic) related.  Effects of local anesthetic: The analgesic effects attained during this period are directly associated to the localized infiltration of local anesthetics and therefore cary significant diagnostic value as to the etiological location, or anatomical origin, of the  pain. Expected duration of relief is directly dependent on the pharmacodynamics of the local anesthetic used. Long-acting (4-6 hours) anesthetics used.  Reported result: Relief during the next 4 to 6 hour after the procedure:75%(Short-Term Relief)            Interpretative annotation: Clinically appropriate result. Analgesia during this period is likely to be Local Anesthetic-related.          Long-term benefit: Defined as the period of time past the expected duration of local anesthetics (1 hour for short-acting and 4-6 hours for long-acting). With the possible exception of prolonged sympathetic blockade from the local anesthetics, benefits during this period are typically attributed to, or associated with, other factors such as analgesic sensory neuropraxia, antiinflammatory effects, or beneficial biochemical changes provided by agents other than the local anesthetics.  Reported result: Extended relief following procedure:30% (Long-Term Relief)            Interpretative annotation: Clinically possible results. Good relief. No permanent benefit expected. Inflammation plays a part in the etiology to the pain.          Current benefits: Defined as reported results that persistent at this point in time.   Analgesia: 25 %            Function: Back to baseline ROM: Back to baseline Interpretative annotation: Partial relief. No permanent benefit expected. Effective diagnostic intervention.          Interpretation: Results would suggest a successful diagnostic intervention.                Patient notes improvement and paresthesias of lower extremities.  States that they are less frequent and less intense.  Plan:  Please see "Plan of Care" for details.                Controlled Substance Pharmacotherapy Assessment REMS (Risk Evaluation and Mitigation Strategy)   12/27/2018  2   12/27/2018  Hydrocodone-Acetamin 7.5-325  14.00 7 Bi Lat   [id number]   [place] (0921)   0  15.00 MME  Comm Ins   [place]    Hart Rochester, RN  01/19/2019  1:43 PM  Sign when Signing Visit Nursing Pain Medication Assessment:  Safety precautions to be maintained throughout the outpatient stay will include: orient to surroundings, keep bed in low position, maintain call bell within reach at all times, provide assistance with transfer out of bed and ambulation.  Medication Inspection Compliance: Ms. Melman did not comply with our request to bring her pills to be counted. She was reminded that bringing the medication bottles, even when empty, is a requirement.  Medication: None brought in. Pill/Patch Count: None available to be counted. Bottle Appearance: No container available. Did not bring bottle(s) to appointment. Filled Date: N/A Last Medication intake:  01/14/2019   Pharmacokinetics: Liberation and absorption (onset of action): WNL Distribution (time to peak effect): WNL Metabolism and excretion (duration of action): WNL         Pharmacodynamics: Desired effects: Analgesia: Ms. Flammia reports >50% benefit. Functional ability: Patient reports that medication allows her to accomplish basic ADLs Clinically meaningful improvement in function (CMIF): Sustained CMIF goals met Perceived effectiveness: Described as relatively effective, allowing for increase in activities of daily living (ADL) Undesirable effects: Side-effects or Adverse  reactions: None reported Monitoring: [place] PMP: Online review of the past 31-monthperiod conducted. Compliant with practice rules and regulations Last UDS on record: Summary  Date Value Ref Range Status  12/27/2018 FINAL  Final    Comment:    ==================================================================== [facility]XAS[facility] COMP DRUG ANALYSIS,UR ==================================================================== Test                             Result       Flag       Units Drug Present and Declared for Prescription Verification   Gabapentin                     PRESENT      EXPECTED    Topiramate                     PRESENT      EXPECTED   Zolpidem                       PRESENT      EXPECTED   Zolpidem Acid                  PRESENT      EXPECTED    Zolpidem acid is an expected metabolite of zolpidem.   Nortriptyline                  PRESENT      EXPECTED    Nortriptyline may be administered as a prescription drug; it is    also an expected metabolite of amitriptyline.   Trazodone                      PRESENT      EXPECTED   1,3 chlorophenyl piperazine    PRESENT      EXPECTED    1,3-chlorophenyl piperazine is an expected metabolite of    trazodone.   Acetaminophen                  PRESENT      EXPECTED Drug Present not Declared for Prescription Verification   Tramadol                       1895         UNEXPECTED ng/mg creat   O-Desmethyltramadol            2338         UNEXPECTED ng/mg creat   N-Desmethyltramadol            875          UNEXPECTED ng/mg creat    Source of tramadol is a prescription medication.    O-desmethyltramadol and N-desmethyltramadol are expected    metabolites of tramadol.   Lidocaine                      PRESENT      UNEXPECTED Drug Absent but Declared for Prescription Verification   Hydrocodone                    Not Detected UNEXPECTED ng/mg creat   Diclofenac                     Not Detected UNEXPECTED    Diclofenac, as indicated in the declared medication list, is not    always detected even when  used as directed.   Propranolol                    Not Detected UNEXPECTED ==================================================================== Test                      Result    Flag   Units      Ref Range   Creatinine              136              mg/dL      >=20 ==================================================================== Declared Medications:  The flagging and interpretation on this report are based on the  following declared medications.  Unexpected results may arise from  inaccuracies in the declared medications.  **Note: The  testing scope of this panel includes these medications:  Gabapentin  Hydrocodone (Hydrocodone-Acetaminophen)  Nortriptyline  Propranolol  Topiramate  Trazodone  **Note: The testing scope of this panel does not include small to  moderate amounts of these reported medications:  Acetaminophen (Hydrocodone-Acetaminophen)  Diclofenac  Zolpidem  **Note: The testing scope of this panel does not include following  reported medications:  Alendronate  Calcium  Cholecalciferol  Ezetimibe  Ferrous Gluconate  Magnesium  Multivitamin ==================================================================== For clinical consultation, please call [phone]. ====================================================================    UDS interpretation: Compliant          Medication Assessment Form: Reviewed. Patient indicates being compliant with therapy Treatment compliance: Compliant Risk Assessment Profile: Aberrant behavior: See initial evaluations. None observed or detected today Comorbid factors increasing risk of overdose: See initial evaluation. No additional risks detected today Opioid risk tool (ORT):  Opioid Risk  12/16/2018  Alcohol 0  Illegal Drugs 0  Rx Drugs 0  Alcohol 0  Illegal Drugs 0  Rx Drugs 0  Age between 16-45 years  0  History of Preadolescent Sexual Abuse 0  Psychological Disease 0  Depression 0  Opioid Risk Tool Scoring 0  Opioid Risk Interpretation Low Risk    ORT Scoring interpretation table:  Score <3 = Low Risk for SUD  Score between 4-7 = Moderate Risk for SUD  Score >8 = High Risk for Opioid Abuse   Risk of substance use disorder (SUD): Low  Risk Mitigation Strategies:  Patient Counseling: Covered Patient-Prescriber Agreement (PPA): Present and active  Notification to other healthcare providers: Done  Pharmacologic Plan: Prescription for hydrocodone 7.5 mg daily PRN, quantity 30/month             Laboratory Chemistry  Inflammation  Markers (CRP: Acute Phase) (ESR: Chronic Phase) No results found for: CRP, ESRSEDRATE, LATICACIDVEN                       Rheumatology Markers No results found for: RF, ANA, LABURIC, URICUR, LYMEIGGIGMAB, LYMEABIGMQN, HLAB27                      Renal Function Markers Lab Results  Component Value Date   BUN 11 08/14/2014   CREATININE 1.10 (H) 02/25/2018   GFRAA >90 08/14/2014   GFRNONAA 89 (L) 08/14/2014                             Hepatic Function Markers No results found for: AST, ALT, ALBUMIN, ALKPHOS, HCVAB, AMYLASE, LIPASE, AMMONIA  Electrolytes Lab Results  Component Value Date   NA 139 08/14/2014   K 4.0 08/14/2014   CL 99 08/14/2014   CALCIUM 10.2 08/14/2014                         Coagulation Parameters Lab Results  Component Value Date   PLT 254 02/25/2016                        Cardiovascular Markers Lab Results  Component Value Date   HGB 11.8 (L) 02/25/2016   HCT 36.4 02/25/2016                         CA Markers No results found for: CEA, CA125, LABCA2                      Endocrine Markers No results found for: TSH, FREET4, TESTOFREE, TESTOSTERONE, ESTRADIOL, ESTRADIOLPCT, ESTRADIOLFRE                      Note: Lab results reviewed.    Meds   Current Outpatient Medications:  .  alendronate (FOSAMAX) 70 MG tablet, TAKE 1 TABLET ONCE A WEEK IN THE AM 30 MIN BEFORE BREAKFAST WITH FULL GLASS OF WATER, Disp: 12 tablet, Rfl: 3 .  CALCIUM PO, Take by mouth daily., Disp: , Rfl:  .  Cholecalciferol (VITAMIN D-3 PO), Take by mouth daily., Disp: , Rfl:  .  diclofenac (VOLTAREN) 75 MG EC tablet, Take 1 tablet (75 mg total) by mouth 2 (two) times daily., Disp: 60 tablet, Rfl: 1 .  EZETIMIBE PO, Take 10 mg by mouth daily., Disp: , Rfl:  .  ferrous gluconate (FERGON) 240 (27 FE) MG tablet, Take by mouth., Disp: , Rfl:  .  FLECTOR 1.3 % PTCH, PLACE 1 PATCH ONTO THE SKIN 2 (TWO) TIMES DAILY. APPLY PATCH TO THE MOST PAINFUL AREA., Disp: ,  Rfl: 11 .  gabapentin (NEURONTIN) 300 MG capsule, Take 300 mg by mouth 2 (two) times daily., Disp: , Rfl:  .  magnesium oxide (MAG-OX) 400 MG tablet, Take 400 mg by mouth 2 (two) times daily., Disp: , Rfl:  .  Multiple Vitamin (MULTIVITAMIN) capsule, Take 1 capsule by mouth daily., Disp: , Rfl:  .  nortriptyline (PAMELOR) 50 MG capsule, 10 mg 3 (three) times daily. , Disp: , Rfl: 0 .  topiramate (TOPAMAX) 25 MG capsule, Take 100 mg by mouth daily. , Disp: , Rfl:  .  traZODone (DESYREL) 100 MG tablet, TAKE 1 TABLET BY MOUTH AT BEDTIME AS NEEDED, Disp: 30 tablet, Rfl: 5 .  zolpidem (AMBIEN) 10 MG tablet, TAKE 1/2 TO 1 TABLET BY MOUTH EVERY DAY AT BEDTIME AS NEEDED, Disp: 30 tablet, Rfl: 2 .  HYDROcodone-acetaminophen (NORCO) 7.5-325 MG tablet, Take 1 tablet by mouth daily as needed for up to 30 days for moderate pain., Disp: 30 tablet, Rfl: 0 .  propranolol (INDERAL) 10 MG tablet, Take by mouth., Disp: , Rfl:   ROS  Constitutional: Denies any fever or chills Gastrointestinal: No reported hemesis, hematochezia, vomiting, or acute GI distress Musculoskeletal: Denies any acute onset joint swelling, redness, loss of ROM, or weakness Neurological: No reported episodes of acute onset apraxia, aphasia, dysarthria, agnosia, amnesia, paralysis, loss of coordination, or loss of consciousness  Allergies  Ms. Spearing is allergic to codeine and morphine.  [place]  Drug: Ms. Soza  reports  no history of drug use. Alcohol:  reports no history of alcohol use. Tobacco:  reports that she has never smoked. She has never used smokeless tobacco. Medical:  has a past medical history of Headache, History of fractured vertebra, Hyperlipidemia, Low bone density, and Transfusion of blood product refused for religious reason. Surgical: Ms. Buske  has a past surgical history that includes Abdominal hysterectomy; Colonoscopy (2000); Eye surgery (Left, 2014); and Artery Biopsy (Left, 12/18/2015). Family: family history  includes Dementia in her mother; Heart attack in her father.  Constitutional Exam  General appearance: Well nourished, well developed, and well hydrated. In no apparent acute distress Vitals:   01/19/19 1336  BP: 136/75  Pulse: 95  Resp: 18  Temp: 98.3 F (36.8 C)  TempSrc: Oral  SpO2: 98%  Weight: 130 lb (59 kg)  Height: '5\' 6"'  (1.676 m)   BMI Assessment: Estimated body mass index is 20.98 kg/m as calculated from the following:   Height as of this encounter: '5\' 6"'  (1.676 m).   Weight as of this encounter: 130 lb (59 kg).  BMI interpretation table: BMI level Category Range association with higher incidence of chronic pain  <18 kg/m2 Underweight   18.5-24.9 kg/m2 Ideal body weight   25-29.9 kg/m2 Overweight Increased incidence by 20%  30-34.9 kg/m2 Obese (Class I) Increased incidence by 68%  35-39.9 kg/m2 Severe obesity (Class II) Increased incidence by 136%  >40 kg/m2 Extreme obesity (Class III) Increased incidence by 254%   Patient's current BMI Ideal Body weight  Body mass index is 20.98 kg/m. Ideal body weight: 59.3 kg (130 lb 11.7 oz)   BMI Readings from Last 4 Encounters:  01/19/19 20.98 kg/m  12/27/18 20.98 kg/m  12/16/18 20.98 kg/m  02/27/16 20.68 kg/m   Wt Readings from Last 4 Encounters:  01/19/19 130 lb (59 kg)  12/27/18 130 lb (59 kg)  12/16/18 130 lb (59 kg)  02/27/16 130 lb 15.3 oz (59.4 kg)  Psych/Mental status: Alert, oriented x 3 (person, place, & time)       Eyes: PERLA Respiratory: No evidence of acute respiratory distress  Cervical Spine Area Exam  Skin & Axial Inspection: No masses, redness, edema, swelling, or associated skin lesions Alignment: Symmetrical Functional ROM: Unrestricted ROM      Stability: No instability detected Muscle Tone/Strength: Functionally intact. No obvious neuro-muscular anomalies detected. Sensory (Neurological): Unimpaired Palpation: No palpable anomalies              Upper Extremity (UE) Exam    Side: Right  upper extremity  Side: Left upper extremity  Skin & Extremity Inspection: Skin color, temperature, and hair growth are WNL. No peripheral edema or cyanosis. No masses, redness, swelling, asymmetry, or associated skin lesions. No contractures.  Skin & Extremity Inspection: Skin color, temperature, and hair growth are WNL. No peripheral edema or cyanosis. No masses, redness, swelling, asymmetry, or associated skin lesions. No contractures.  Functional ROM: Unrestricted ROM          Functional ROM: Unrestricted ROM          Muscle Tone/Strength: Functionally intact. No obvious neuro-muscular anomalies detected.  Muscle Tone/Strength: Functionally intact. No obvious neuro-muscular anomalies detected.  Sensory (Neurological): Unimpaired          Sensory (Neurological): Unimpaired          Palpation: No palpable anomalies              Palpation: No palpable anomalies  Provocative Test(s):  Phalen's test: deferred Tinel's test: deferred Apley's scratch test (touch opposite shoulder):  Action 1 (Across chest): deferred Action 2 (Overhead): deferred Action 3 (LB reach): deferred   Provocative Test(s):  Phalen's test: deferred Tinel's test: deferred Apley's scratch test (touch opposite shoulder):  Action 1 (Across chest): deferred Action 2 (Overhead): deferred Action 3 (LB reach): deferred    Thoracic Spine Area Exam  Skin & Axial Inspection: No masses, redness, or swelling Alignment: Symmetrical Functional ROM: Unrestricted ROM Stability: No instability detected Muscle Tone/Strength: Functionally intact. No obvious neuro-muscular anomalies detected. Sensory (Neurological): Unimpaired Muscle strength & Tone: No palpable anomalies  Lumbar Spine Area Exam  Skin & Axial Inspection: No masses, redness, or swelling Alignment: Symmetrical Functional ROM: Pain restricted ROM       Stability: No instability detected Muscle Tone/Strength: Functionally intact. No obvious neuro-muscular  anomalies detected. Sensory (Neurological): Dermatomal pain pattern and articular Palpation: No palpable anomalies       Provocative Tests: Hyperextension/rotation test: (+) bilaterally for facet joint pain. Lumbar quadrant test (Kemp's test): (+) bilaterally for facet joint pain. Lateral bending test: (+) due to pain. Patrick's Maneuver: deferred today                   FABER* test: deferred today                   S-I anterior distraction/compression test: deferred today         S-I lateral compression test: deferred today         S-I Thigh-thrust test: deferred today         S-I Gaenslen's test: deferred today         *(Flexion, ABduction and External Rotation)  Gait & Posture Assessment  Ambulation: Unassisted Gait: Relatively normal for age and body habitus Posture: WNL   Lower Extremity Exam    Side: Right lower extremity  Side: Left lower extremity  Stability: No instability observed          Stability: No instability observed          Skin & Extremity Inspection: Skin color, temperature, and hair growth are WNL. No peripheral edema or cyanosis. No masses, redness, swelling, asymmetry, or associated skin lesions. No contractures.  Skin & Extremity Inspection: Skin color, temperature, and hair growth are WNL. No peripheral edema or cyanosis. No masses, redness, swelling, asymmetry, or associated skin lesions. No contractures.  Functional ROM: Unrestricted ROM                  Functional ROM: Unrestricted ROM                  Muscle Tone/Strength: Functionally intact. No obvious neuro-muscular anomalies detected.  Muscle Tone/Strength: Functionally intact. No obvious neuro-muscular anomalies detected.  Sensory (Neurological): Unimpaired        Sensory (Neurological): Unimpaired        DTR: Patellar: deferred today Achilles: deferred today Plantar: deferred today  DTR: Patellar: deferred today Achilles: deferred today Plantar: deferred today  Palpation: No palpable anomalies   Palpation: No palpable anomalies   Assessment   Status Diagnosis  Persistent Persistent Persistent 1. Lumbar facet arthropathy (L3/4 and L4/5)   2. Lumbar spondylosis   3. Degeneration of lumbar or lumbosacral intervertebral disc   4. Chronic pain syndrome      Patient follow-up after right L4-L5 epidural steroid injection which was effective for her lower extremity neuropathic pain and paresthesias.  Patient describes decreased burning  and tingling in her feet during the course of the day.  Patient is continuing to endorse axial low back pain.  She has been engaging with physical therapy which is helping with her range of motion but not with her pain.  Patient does have pain with facet loading and lumbar extension.  She does have radiographic evidence of facet arthropathy and lumbar spondylosis.  TISHINA LOWN has a history of greater than 3 months of moderate to severe pain which is resulted in functional impairment.  The patient has tried various conservative therapeutic options such as NSAIDs, Tylenol, muscle relaxants, physical therapy which was inadequately effective.  Patient's pain is predominantly axial with physical exam findings suggestive of facet arthropathy.  Lumbar facet medial branch nerve blocks were discussed with the patient.  Risks and benefits were reviewed.  Patient would like to proceed with bilateral L3, L4, L5, S1 medial branch nerve block.  Will also refill Hydrocodone as below.  Also discussed SCS- can consider in future, patient will need psych eval.  Plan: -Diagnostic bilateral L3, L4, L5, S1 facet medial branch nerve blocks for lumbar spondylosis, facet arthropathy -Refill hydrocodone as below.  Continue gabapentin 300 mg twice daily, Flector patch as needed, nortriptyline and Topamax.   Plan of Care  Pharmacotherapy (Medications Ordered): Meds ordered this encounter  Medications  . HYDROcodone-acetaminophen (NORCO) 7.5-325 MG tablet    Sig: Take 1  tablet by mouth daily as needed for up to 30 days for moderate pain.    Dispense:  30 tablet    Refill:  0   Lab-work, procedure(s), and/or referral(s): Orders Placed This Encounter  Procedures  . LUMBAR FACET(MEDIAL BRANCH NERVE BLOCK) MBNB    Pharmacological management options:   Interventional management options:  Considering:   Lumbar facet medial branch nerve blocks, lumbar radiofrequency ablation Lumbar spinal cord stimulation    PRN Procedures:   To be determined at a later time   Provider-requested follow-up: Return for Procedure.  Future Appointments  Date Time Provider [place]  01/24/2019 10:30 AM Shelton Silvas, PT [facility] None  01/26/2019 10:30 AM Shelton Silvas, PT [facility] None  01/31/2019  9:45 AM Shelton Silvas, PT [facility] None  02/02/2019 10:30 AM Shelton Silvas, PT [facility] None  02/17/2019  8:45 AM Gillis Santa, MD [facility] None    Primary Care Physician: Tracie Harrier, MD Location: [facility] Outpatient Pain Management Facility Note by: Gillis Santa, M.D Date: 01/19/2019; Time: 8:19 AM  Patient Instructions   GENERAL RISKS AND COMPLICATIONS  What are the risk, side effects and possible complications? Generally speaking, most procedures are safe.  However, with any procedure there are risks, side effects, and the possibility of complications.  The risks and complications are dependent upon the sites that are lesioned, or the type of nerve block to be performed.  The closer the procedure is to the spine, the more serious the risks are.  Great care is taken when placing the radio frequency needles, block needles or lesioning probes, but sometimes complications can occur. 1. Infection: Any time there is an injection through the skin, there is a risk of infection.  This is why sterile conditions are used for these blocks.  There are four possible types of infection. 1. Localized skin infection. 2. Central Nervous System Infection-This can be in  the form of Meningitis, which can be deadly. 3. Epidural Infections-This can be in the form of an epidural abscess, which can cause pressure inside of the spine, causing compression of the spinal cord  with subsequent paralysis. This would require an emergency surgery to decompress, and there are no guarantees that the patient would recover from the paralysis. 4. Discitis-This is an infection of the intervertebral discs.  It occurs in about 1% of discography procedures.  It is difficult to treat and it may lead to surgery.        2. Pain: the needles have to go through skin and soft tissues, will cause soreness.       3. Damage to internal structures:  The nerves to be lesioned may be near blood vessels or    other nerves which can be potentially damaged.       4. Bleeding: Bleeding is more common if the patient is taking blood thinners such as  aspirin, Coumadin, Ticiid, Plavix, etc., or if he/she have some genetic predisposition  such as hemophilia. Bleeding into the spinal canal can cause compression of the spinal  cord with subsequent paralysis.  This would require an emergency surgery to  decompress and there are no guarantees that the patient would recover from the  paralysis.       5. Pneumothorax:  Puncturing of a lung is a possibility, every time a needle is introduced in  the area of the chest or upper back.  Pneumothorax refers to free air around the  collapsed lung(s), inside of the thoracic cavity (chest cavity).  Another two possible  complications related to a similar event would include: Hemothorax and Chylothorax.   These are variations of the Pneumothorax, where instead of air around the collapsed  lung(s), you may have blood or chyle, respectively.       6. Spinal headaches: They may occur with any procedures in the area of the spine.       7. Persistent CSF (Cerebro-Spinal Fluid) leakage: This is a rare problem, but may occur  with prolonged intrathecal or epidural catheters either due  to the formation of a fistulous  track or a dural tear.       8. Nerve damage: By working so close to the spinal cord, there is always a possibility of  nerve damage, which could be as serious as a permanent spinal cord injury with  paralysis.       9. Death:  Although rare, severe deadly allergic reactions known as "Anaphylactic  reaction" can occur to any of the medications used.      10. Worsening of the symptoms:  We can always make thing worse.  What are the chances of something like this happening? Chances of any of this occuring are extremely low.  By statistics, you have more of a chance of getting killed in a motor vehicle accident: while driving to the hospital than any of the above occurring .  Nevertheless, you should be aware that they are possibilities.  In general, it is similar to taking a shower.  Everybody knows that you can slip, hit your head and get killed.  Does that mean that you should not shower again?  Nevertheless always keep in mind that statistics do not mean anything if you happen to be on the wrong side of them.  Even if a procedure has a 1 (one) in a 1,000,000 (million) chance of going wrong, it you happen to be that one..Also, keep in mind that by statistics, you have more of a chance of having something go wrong when taking medications.  Who should not have this procedure? If you are on a blood thinning medication (e.g. Coumadin,  Plavix, see list of "Blood Thinners"), or if you have an active infection going on, you should not have the procedure.  If you are taking any blood thinners, please inform your physician.  How should I prepare for this procedure?  Do not eat or drink anything at least six hours prior to the procedure.  Bring a driver with you .  It cannot be a taxi.  Come accompanied by an adult that can drive you back, and that is strong enough to help you if your legs get weak or numb from the local anesthetic.  Take all of your medicines the morning of  the procedure with just enough water to swallow them.  If you have diabetes, make sure that you are scheduled to have your procedure done first thing in the morning, whenever possible.  If you have diabetes, take only half of your insulin dose and notify our nurse that you have done so as soon as you arrive at the clinic.  If you are diabetic, but only take blood sugar pills (oral hypoglycemic), then do not take them on the morning of your procedure.  You may take them after you have had the procedure.  Do not take aspirin or any aspirin-containing medications, at least eleven (11) days prior to the procedure.  They may prolong bleeding.  Wear loose fitting clothing that may be easy to take off and that you would not mind if it got stained with Betadine or blood.  Do not wear any jewelry or perfume  Remove any nail coloring.  It will interfere with some of our monitoring equipment.  NOTE: Remember that this is not meant to be interpreted as a complete list of all possible complications.  Unforeseen problems may occur.  BLOOD THINNERS The following drugs contain aspirin or other products, which can cause increased bleeding during surgery and should not be taken for 2 weeks prior to and 1 week after surgery.  If you should need take something for relief of minor pain, you may take acetaminophen which is found in Tylenol,m Datril, Anacin-3 and Panadol. It is not blood thinner. The products listed below are.  Do not take any of the products listed below in addition to any listed on your instruction sheet.  A.P.C or A.P.C with Codeine Codeine Phosphate Capsules #3 Ibuprofen Ridaura  ABC compound Congesprin Imuran rimadil  Advil Cope Indocin Robaxisal  Alka-Seltzer Effervescent Pain Reliever and Antacid Coricidin or Coricidin-D  Indomethacin Rufen  Alka-Seltzer plus Cold Medicine Cosprin Ketoprofen S-A-C Tablets  Anacin Analgesic Tablets or Capsules Coumadin Korlgesic Salflex  Anacin Extra  Strength Analgesic tablets or capsules CP-2 Tablets Lanoril Salicylate  Anaprox Cuprimine Capsules Levenox Salocol  Anexsia-D Dalteparin Magan Salsalate  Anodynos Darvon compound Magnesium Salicylate Sine-off  Ansaid Dasin Capsules Magsal Sodium Salicylate  Anturane Depen Capsules Marnal Soma  APF Arthritis pain formula Dewitt's Pills Measurin Stanback  Argesic Dia-Gesic Meclofenamic Sulfinpyrazone  Arthritis Bayer Timed Release Aspirin Diclofenac Meclomen Sulindac  Arthritis pain formula Anacin Dicumarol Medipren Supac  Analgesic (Safety coated) Arthralgen Diffunasal Mefanamic Suprofen  Arthritis Strength Bufferin Dihydrocodeine Mepro Compound Suprol  Arthropan liquid Dopirydamole Methcarbomol with Aspirin Synalgos  ASA tablets/Enseals Disalcid Micrainin Tagament  Ascriptin Doan's Midol Talwin  Ascriptin A/D Dolene Mobidin Tanderil  Ascriptin Extra Strength Dolobid Moblgesic Ticlid  Ascriptin with Codeine Doloprin or Doloprin with Codeine Momentum Tolectin  Asperbuf Duoprin Mono-gesic Trendar  Aspergum Duradyne Motrin or Motrin IB Triminicin  Aspirin plain, buffered or enteric coated Durasal Myochrisine Trigesic  Aspirin Suppositories Easprin Nalfon Trillsate  Aspirin with Codeine Ecotrin Regular or Extra Strength Naprosyn Uracel  Atromid-S Efficin Naproxen Ursinus  Auranofin Capsules Elmiron Neocylate Vanquish  Axotal Emagrin Norgesic Verin  Azathioprine Empirin or Empirin with Codeine Normiflo Vitamin E  Azolid Emprazil Nuprin Voltaren  Bayer Aspirin plain, buffered or children's or timed BC Tablets or powders Encaprin Orgaran Warfarin Sodium  Buff-a-Comp Enoxaparin Orudis Zorpin  Buff-a-Comp with Codeine Equegesic Os-Cal-Gesic   Buffaprin Excedrin plain, buffered or Extra Strength Oxalid   Bufferin Arthritis Strength Feldene Oxphenbutazone   Bufferin plain or Extra Strength Feldene Capsules Oxycodone with Aspirin   Bufferin with Codeine Fenoprofen Fenoprofen Pabalate or  Pabalate-SF   Buffets II Flogesic Panagesic   Buffinol plain or Extra Strength Florinal or Florinal with Codeine Panwarfarin   Buf-Tabs Flurbiprofen Penicillamine   Butalbital Compound Four-way cold tablets Penicillin   Butazolidin Fragmin Pepto-Bismol   Carbenicillin Geminisyn Percodan   Carna Arthritis Reliever Geopen Persantine   Carprofen Gold's salt Persistin   Chloramphenicol Goody's Phenylbutazone   Chloromycetin Haltrain Piroxlcam   Clmetidine heparin Plaquenil   Cllnoril Hyco-pap Ponstel   Clofibrate Hydroxy chloroquine Propoxyphen         Before stopping any of these medications, be sure to consult the physician who ordered them.  Some, such as Coumadin (Warfarin) are ordered to prevent or treat serious conditions such as "deep thrombosis", "pumonary embolisms", and other heart problems.  The amount of time that you may need off of the medication may also vary with the medication and the reason for which you were taking it.  If you are taking any of these medications, please make sure you notify your pain physician before you undergo any procedures.         Facet Blocks Patient Information  Description: The facets are joints in the spine between the vertebrae.  Like any joints in the body, facets can become irritated and painful.  Arthritis can also effect the facets.  By injecting steroids and local anesthetic in and around these joints, we can temporarily block the nerve supply to them.  Steroids act directly on irritated nerves and tissues to reduce selling and inflammation which often leads to decreased pain.  Facet blocks may be done anywhere along the spine from the neck to the low back depending upon the location of your pain.   After numbing the skin with local anesthetic (like Novocaine), a small needle is passed onto the facet joints under x-ray guidance.  You may experience a sensation of pressure while this is being done.  The entire block usually lasts about 15-25  minutes.   Conditions which may be treated by facet blocks:   Low back/buttock pain  Neck/shoulder pain  Certain types of headaches  Preparation for the injection:  1. Do not eat any solid food or dairy products within 8 hours of your appointment. 2. You may drink clear liquid up to 3 hours before appointment.  Clear liquids include water, black coffee, juice or soda.  No milk or cream please. 3. You may take your regular medication, including pain medications, with a sip of water before your appointment.  Diabetics should hold regular insulin (if taken separately) and take 1/2 normal NPH dose the morning of the procedure.  Carry some sugar containing items with you to your appointment. 4. A driver must accompany you and be prepared to drive you home after your procedure. 5. Bring all your current medications with you. 6. An IV may be inserted  and sedation may be given at the discretion of the physician. 7. A blood pressure cuff, EKG and other monitors will often be applied during the procedure.  Some patients may need to have extra oxygen administered for a short period. 8. You will be asked to provide medical information, including your allergies and medications, prior to the procedure.  We must know immediately if you are taking blood thinners (like Coumadin/Warfarin) or if you are allergic to IV iodine contrast (dye).  We must know if you could possible be pregnant.  Possible side-effects:   Bleeding from needle site  Infection (rare, may require surgery)  Nerve injury (rare)  Numbness & tingling (temporary)  Difficulty urinating (rare, temporary)  Spinal headache (a headache worse with upright posture)  Light-headedness (temporary)  Pain at injection site (serveral days)  Decreased blood pressure (rare, temporary)  Weakness in arm/leg (temporary)  Pressure sensation in back/neck (temporary)   Call if you experience:   Fever/chills associated with headache or  increased back/neck pain  Headache worsened by an upright position  New onset, weakness or numbness of an extremity below the injection site  Hives or difficulty breathing (go to the emergency room)  Inflammation or drainage at the injection site(s)  Severe back/neck pain greater than usual  New symptoms which are concerning to you  Please note:  Although the local anesthetic injected can often make your back or neck feel good for several hours after the injection, the pain will likely return. It takes 3-7 days for steroids to work.  You may not notice any pain relief for at least one week.  If effective, we will often do a series of 2-3 injections spaced 3-6 weeks apart to maximally decrease your pain.  After the initial series, you may be a candidate for a more permanent nerve block of the facets.  If you have any questions, please call #[phone]) [facility]

## 2019-01-24 ENCOUNTER — Telehealth: Payer: Self-pay

## 2019-01-24 ENCOUNTER — Ambulatory Visit: Payer: PRIVATE HEALTH INSURANCE | Admitting: Physical Therapy

## 2019-01-24 NOTE — Telephone Encounter (Signed)
Called pharmacy and they states it needs a PA

## 2019-01-24 NOTE — Telephone Encounter (Signed)
PA sent via cover my meds.

## 2019-01-24 NOTE — Telephone Encounter (Signed)
Patient called back and said [facility] in [facility] did not get her call in script for Hydrocodone. She thought this was taken care of last Thursday. Please make sure it is called in to [facility] today.

## 2019-01-25 ENCOUNTER — Ambulatory Visit: Payer: PRIVATE HEALTH INSURANCE | Admitting: Student in an Organized Health Care Education/Training Program

## 2019-01-26 ENCOUNTER — Ambulatory Visit: Payer: PRIVATE HEALTH INSURANCE | Admitting: Physical Therapy

## 2019-01-31 ENCOUNTER — Ambulatory Visit
Admission: RE | Admit: 2019-01-31 | Discharge: 2019-01-31 | Disposition: A | Discharge status: Home / Self Care | Payer: PRIVATE HEALTH INSURANCE | Source: Ambulatory Visit | Attending: Student in an Organized Health Care Education/Training Program | Admitting: Student in an Organized Health Care Education/Training Program

## 2019-01-31 ENCOUNTER — Ambulatory Visit: Payer: PRIVATE HEALTH INSURANCE | Admitting: Physical Therapy

## 2019-01-31 ENCOUNTER — Ambulatory Visit (HOSPITAL_BASED_OUTPATIENT_CLINIC_OR_DEPARTMENT_OTHER): Payer: PRIVATE HEALTH INSURANCE | Admitting: Student in an Organized Health Care Education/Training Program

## 2019-01-31 ENCOUNTER — Encounter: Payer: Self-pay | Admitting: Student in an Organized Health Care Education/Training Program

## 2019-01-31 ENCOUNTER — Other Ambulatory Visit: Payer: Self-pay

## 2019-01-31 DIAGNOSIS — M47816 Spondylosis without myelopathy or radiculopathy, lumbar region: Secondary | ICD-10-CM

## 2019-01-31 MED ORDER — DEXAMETHASONE SODIUM PHOSPHATE 10 MG/ML IJ SOLN
INTRAMUSCULAR | Status: AC
  Filled 2019-01-31: qty 1

## 2019-01-31 MED ORDER — LIDOCAINE HCL 2 % IJ SOLN
20.0000 mL | Freq: Once | INTRAMUSCULAR | Status: AC
  Administered 2019-01-31: 400 mg

## 2019-01-31 MED ORDER — ROPIVACAINE HCL 2 MG/ML IJ SOLN
INTRAMUSCULAR | Status: AC
  Filled 2019-01-31: qty 10

## 2019-01-31 MED ORDER — DICLOFENAC SODIUM 75 MG PO TBEC: 75.0000 mg | DELAYED_RELEASE_TABLET | Freq: Two times a day (BID) | ORAL | 4 refills | Status: DC

## 2019-01-31 MED ORDER — ROPIVACAINE HCL 2 MG/ML IJ SOLN
9.0000 mL | Freq: Once | INTRAMUSCULAR | Status: AC
  Administered 2019-01-31: 10 mL

## 2019-01-31 MED ORDER — FENTANYL CITRATE (PF) 100 MCG/2ML IJ SOLN
INTRAMUSCULAR | Status: AC
  Filled 2019-01-31: qty 2

## 2019-01-31 MED ORDER — ROPIVACAINE HCL 2 MG/ML IJ SOLN
9.0000 mL | Freq: Once | INTRAMUSCULAR | Status: AC
  Administered 2019-01-31: 10 mL via PERINEURAL

## 2019-01-31 MED ORDER — DEXAMETHASONE SODIUM PHOSPHATE 10 MG/ML IJ SOLN
20.0000 mg | Freq: Once | INTRAMUSCULAR | Status: AC
  Administered 2019-01-31: 10 mg

## 2019-01-31 MED ORDER — LIDOCAINE HCL 2 % IJ SOLN
INTRAMUSCULAR | Status: AC
  Filled 2019-01-31: qty 20

## 2019-01-31 NOTE — Progress Notes (Signed)
Patient's Name: Teresa [name]  MRN: [id number]  Referring Provider: Gillis Santa, MD  DOB: 09-19-1956  PCP: Tracie Harrier, MD  DOS: 01/31/2019  Note by: Gillis Santa, MD  Service setting: Ambulatory outpatient  Specialty: Interventional Pain Management  Patient type: Established  Location: [facility] (AMB) Pain Management Facility  Visit type: Interventional Procedure   Primary Reason for Visit: Interventional Pain Management Treatment. CC: Back Pain (lower, right is worse )  Procedure:          Anesthesia, Analgesia, Anxiolysis:  Type: Lumbar Facet, Medial Branch Block(s) #1  Primary Purpose: Diagnostic Region: Posterolateral Lumbosacral Spine Level: L3, L4, L5, & S1 Medial Branch Level(s). Injecting these levels blocks the L3-4, L4-5, and L5-S1 lumbar facet joints. Laterality: Bilateral  Type: Moderate (Conscious) Sedation combined with Local Anesthesia Indication(s): Analgesia and Anxiety Route: Intravenous (IV) IV Access: Secured Sedation: Meaningful verbal contact was maintained at all times during the procedure  Local Anesthetic: Lidocaine 1-2%  Position: Prone   Indications: 1. Lumbar facet arthropathy (L3/4 and L4/5)    Pain Score: Pre-procedure: 2 /10 Post-procedure: 0-No pain/10  Pre-op Assessment:  Teresa [name] is a 63 y.o. (year old), female patient, seen today for interventional treatment. She  has a past surgical history that includes Abdominal hysterectomy; Colonoscopy (2000); Eye surgery (Left, 2014); and Artery Biopsy (Left, 12/18/2015). Teresa [name] has a current medication list which includes the following prescription(s): alendronate, calcium, cholecalciferol, diclofenac, ezetimibe, ferrous gluconate, gabapentin, hydrocodone-acetaminophen, magnesium oxide, multivitamin, nortriptyline, topiramate, trazodone, zolpidem, flector, and propranolol. Her primarily concern today is the Back Pain (lower, right is worse )  Initial Vital Signs:  Pulse/HCG Rate: 89ECG Heart Rate:  86 Temp: 98.1 F (36.7 C) Resp: 16 BP: 111/61 SpO2: 100 %  BMI: Estimated body mass index is 21.79 kg/m as calculated from the following:   Height as of this encounter: 5\' 6"  (1.676 m).   Weight as of this encounter: 135 lb (61.2 kg).  Risk Assessment: Allergies: Reviewed. She is allergic to codeine and morphine.  Allergy Precautions: None required Coagulopathies: Reviewed. None identified.  Blood-thinner therapy: None at this time Active Infection(s): Reviewed. None identified. Teresa [name] is afebrile  Site Confirmation: Teresa [name] was asked to confirm the procedure and laterality before marking the site Procedure checklist: Completed Consent: Before the procedure and under the influence of no sedative(s), amnesic(s), or anxiolytics, the patient was informed of the treatment options, risks and possible complications. To fulfill our ethical and legal obligations, as recommended by the American Medical Association's Code of Ethics, I have informed the patient of my clinical impression; the nature and purpose of the treatment or procedure; the risks, benefits, and possible complications of the intervention; the alternatives, including doing nothing; the risk(s) and benefit(s) of the alternative treatment(s) or procedure(s); and the risk(s) and benefit(s) of doing nothing. The patient was provided information about the general risks and possible complications associated with the procedure. These may include, but are not limited to: failure to achieve desired goals, infection, bleeding, organ or nerve damage, allergic reactions, paralysis, and death. In addition, the patient was informed of those risks and complications associated to Spine-related procedures, such as failure to decrease pain; infection (i.e.: Meningitis, epidural or intraspinal abscess); bleeding (i.e.: epidural hematoma, subarachnoid hemorrhage, or any other type of intraspinal or peri-dural bleeding); organ or nerve damage (i.e.:  Any type of peripheral nerve, nerve root, or spinal cord injury) with subsequent damage to sensory, motor, and/or autonomic systems, resulting in permanent pain, numbness, and/or weakness of  one or several areas of the body; allergic reactions; (i.e.: anaphylactic reaction); and/or death. Furthermore, the patient was informed of those risks and complications associated with the medications. These include, but are not limited to: allergic reactions (i.e.: anaphylactic or anaphylactoid reaction(s)); adrenal axis suppression; blood sugar elevation that in diabetics may result in ketoacidosis or comma; water retention that in patients with history of congestive heart failure may result in shortness of breath, pulmonary edema, and decompensation with resultant heart failure; weight gain; swelling or edema; medication-induced neural toxicity; particulate matter embolism and blood vessel occlusion with resultant organ, and/or nervous system infarction; and/or aseptic necrosis of one or more joints. Finally, the patient was informed that Medicine is not an exact science; therefore, there is also the possibility of unforeseen or unpredictable risks and/or possible complications that may result in a catastrophic outcome. The patient indicated having understood very clearly. We have given the patient no guarantees and we have made no promises. Enough time was given to the patient to ask questions, all of which were answered to the patient's satisfaction. Teresa [name] has indicated that she wanted to continue with the procedure. Attestation: I, the ordering provider, attest that I have discussed with the patient the benefits, risks, side-effects, alternatives, likelihood of achieving goals, and potential problems during recovery for the procedure that I have provided informed consent. Date   Time: 01/31/2019  8:48 AM  Pre-Procedure Preparation:  Monitoring: As per clinic protocol. Respiration, ETCO2, SpO2, BP, heart rate  and rhythm monitor placed and checked for adequate function Safety Precautions: Patient was assessed for positional comfort and pressure points before starting the procedure. Time-out: I initiated and conducted the "Time-out" before starting the procedure, as per protocol. The patient was asked to participate by confirming the accuracy of the "Time Out" information. Verification of the correct person, site, and procedure were performed and confirmed by me, the nursing staff, and the patient. "Time-out" conducted as per Joint Commission's Universal Protocol (UP.01.01.01). Time: 0927  Description of Procedure:          Laterality: Bilateral. The procedure was performed in identical fashion on both sides. Levels: L3, L4, L5, & S1 Medial Branch Level(s) Area Prepped: Posterior Lumbosacral Region Prepping solution: ChloraPrep (2% chlorhexidine gluconate and 70% isopropyl alcohol) Safety Precautions: Aspiration looking for blood return was conducted prior to all injections. At no point did we inject any substances, as a needle was being advanced. Before injecting, the patient was told to immediately notify me if she was experiencing any new onset of "ringing in the ears, or metallic taste in the mouth". No attempts were made at seeking any paresthesias. Safe injection practices and needle disposal techniques used. Medications properly checked for expiration dates. SDV (single dose vial) medications used. After the completion of the procedure, all disposable equipment used was discarded in the proper designated medical waste containers. Local Anesthesia: Protocol guidelines were followed. The patient was positioned over the fluoroscopy table. The area was prepped in the usual manner. The time-out was completed. The target area was identified using fluoroscopy. A 12-in long, straight, sterile hemostat was used with fluoroscopic guidance to locate the targets for each level blocked. Once located, the skin was  marked with an approved surgical skin marker. Once all sites were marked, the skin (epidermis, dermis, and hypodermis), as well as deeper tissues (fat, connective tissue and muscle) were infiltrated with a small amount of a short-acting local anesthetic, loaded on a 10cc syringe with a 25G, 1.5-in  Needle. An  appropriate amount of time was allowed for local anesthetics to take effect before proceeding to the next step. Local Anesthetic: Lidocaine 2.0% The unused portion of the local anesthetic was discarded in the proper designated containers. Technical explanation of process:   L3 Medial Branch Nerve Block (MBB): The target area for the L3 medial branch is at the junction of the postero-lateral aspect of the superior articular process and the superior, posterior, and medial edge of the transverse process of L4. Under fluoroscopic guidance, a Quincke needle was inserted until contact was made with os over the superior postero-lateral aspect of the pedicular shadow (target area). After negative aspiration for blood, 55mL of the nerve block solution was injected without difficulty or complication. The needle was removed intact. L4 Medial Branch Nerve Block (MBB): The target area for the L4 medial branch is at the junction of the postero-lateral aspect of the superior articular process and the superior, posterior, and medial edge of the transverse process of L5. Under fluoroscopic guidance, a Quincke needle was inserted until contact was made with os over the superior postero-lateral aspect of the pedicular shadow (target area). After negative aspiration for blood, 1 mL of the nerve block solution was injected without difficulty or complication. The needle was removed intact. L5 Medial Branch Nerve Block (MBB): The target area for the L5 medial branch is at the junction of the postero-lateral aspect of the superior articular process and the superior, posterior, and medial edge of the sacral ala. Under fluoroscopic  guidance, a Quincke needle was inserted until contact was made with os over the superior postero-lateral aspect of the pedicular shadow (target area). After negative aspiration for blood, 54mL of the nerve block solution was injected without difficulty or complication. The needle was removed intact. S1 Medial Branch Nerve Block (MBB): The target area for the S1 medial branch is at the posterior and inferior 6 o'clock position of the L5-S1 facet joint. Under fluoroscopic guidance, the Quincke needle inserted for the L5 MBB was redirected until contact was made with os over the inferior and postero aspect of the sacrum, at the 6 o' clock position under the L5-S1 facet joint (Target area). After negative aspiration for blood, 1 mL of the nerve block solution was injected without difficulty or complication. The needle was removed intact.  Nerve block solution: 8 cc solution made of 6 cc of 0.2% ropivacaine, 2 cc of Decadron 10 mg/cc.  1 cc injected at each level above bilaterally.  Total steroid dose: 20 mg Decadron Procedural Needles: 22-gauge, 3.5-inch, Quincke needles used for all levels.  Once the entire procedure was completed, the treated area was cleaned, making sure to leave some of the prepping solution back to take advantage of its long term bactericidal properties.   Illustration of the posterior view of the lumbar spine and the posterior neural structures. Laminae of L2 through S1 are labeled. DPRL5, dorsal primary ramus of L5; DPRS1, dorsal primary ramus of S1; DPR3, dorsal primary ramus of L3; FJ, facet (zygapophyseal) joint L3-L4; I, inferior articular process of L4; LB1, lateral branch of dorsal primary ramus of L1; IAB, inferior articular branches from L3 medial branch (supplies L4-L5 facet joint); IBP, intermediate branch plexus; MB3, medial branch of dorsal primary ramus of L3; NR3, third lumbar nerve root; S, superior articular process of L5; SAB, superior articular branches from L4 (supplies  L4-5 facet joint also); TP3, transverse process of L3.  Vitals:   01/31/19 0935 01/31/19 0940 01/31/19 0945 01/31/19 0955  BP:  133/67 134/79 138/74 (!) 147/87  Pulse:      Resp: 14 12 17 16   Temp:      TempSrc:      SpO2: 100% 100% 99% 100%  Weight:      Height:         Start Time: 0928 hrs. End Time: 0947 hrs.  Imaging Guidance (Spinal):          Type of Imaging Technique: Fluoroscopy Guidance (Spinal) Indication(s): Assistance in needle guidance and placement for procedures requiring needle placement in or near specific anatomical locations not easily accessible without such assistance. Exposure Time: Please see nurses notes. Contrast: None used. Fluoroscopic Guidance: I was personally present during the use of fluoroscopy. "Tunnel Vision Technique" used to obtain the best possible view of the target area. Parallax error corrected before commencing the procedure. "Direction-depth-direction" technique used to introduce the needle under continuous pulsed fluoroscopy. Once target was reached, antero-posterior, oblique, and lateral fluoroscopic projection used confirm needle placement in all planes. Images permanently stored in EMR. Interpretation: No contrast injected. I personally interpreted the imaging intraoperatively. Adequate needle placement confirmed in multiple planes. Permanent images saved into the patient's record.  Antibiotic Prophylaxis:   Anti-infectives (From admission, onward)   None     Indication(s): None identified  Post-operative Assessment:  Post-procedure Vital Signs:  Pulse/HCG Rate: 8987 Temp: 98.1 F (36.7 C) Resp: 16 BP: (!) 147/87 SpO2: 100 %  EBL: None  Complications: No immediate post-treatment complications observed by team, or reported by patient.  Note: The patient tolerated the entire procedure well. A repeat set of vitals were taken after the procedure and the patient was kept under observation following institutional policy, for this  type of procedure. Post-procedural neurological assessment was performed, showing return to baseline, prior to discharge. The patient was provided with post-procedure discharge instructions, including a section on how to identify potential problems. Should any problems arise concerning this procedure, the patient was given instructions to immediately contact us, at any time, without hesitation. In any case, we plan to contact the patient by telephone for a follow-up status report regarding this interventional procedure.  Comments:  No additional relevant information.  5 out of 5 strength bilateral lower extremity: Plantar flexion, dorsiflexion, knee flexion, knee extension.   Plan of Care  Orders:  Orders Placed This Encounter  Procedures   DG C-Arm 1-60 Min-No Report    Intraoperative interpretation by procedural physician at [place].    Standing Status:   Standing    Number of Occurrences:   1    Order Specific Question:   Reason for exam:    Answer:   Assistance in needle guidance and placement for procedures requiring needle placement in or near specific anatomical locations not easily accessible without such assistance.   Medications ordered for procedure: Meds ordered this encounter  Medications   lidocaine (XYLOCAINE) 2 % (with pres) injection 400 mg   ropivacaine (PF) 2 mg/mL (0.2%) (NAROPIN) injection 9 mL   ropivacaine (PF) 2 mg/mL (0.2%) (NAROPIN) injection 9 mL   dexamethasone (DECADRON) injection 20 mg   diclofenac (VOLTAREN) 75 MG EC tablet    Sig: Take 1 tablet (75 mg total) by mouth 2 (two) times daily.    Dispense:  60 tablet    Refill:  4   Medications administered: We administered lidocaine, ropivacaine (PF) 2 mg/mL (0.2%), ropivacaine (PF) 2 mg/mL (0.2%), and dexamethasone.  See the medical record for exact dosing, route, and time of administration.  Disposition: Discharge home  Discharge Date & Time: 01/31/2019; 1000 hrs.   Follow-up plan:    Return in about 2 weeks (around 02/14/2019) for Post Procedure Evaluation- VIRTUAL VISIT.     Future Appointments  Date Time Provider [place]  02/02/2019 10:30 AM Shelton Silvas, PT [facility] None  02/21/2019  2:00 PM Gillis Santa, MD [facility] None   Primary Care Physician: Tracie Harrier, MD Location: [facility] Outpatient Pain Management Facility Note by: Gillis Santa, MD Date: 01/31/2019; Time: 11:16 AM  Disclaimer:  Medicine is not an exact science. The only guarantee in medicine is that nothing is guaranteed. It is important to note that the decision to proceed with this intervention was based on the information collected from the patient. The Data and conclusions were drawn from the patient's questionnaire, the interview, and the physical examination. Because the information was provided in large part by the patient, it cannot be guaranteed that it has not been purposely or unconsciously manipulated. Every effort has been made to obtain as much relevant data as possible for this evaluation. It is important to note that the conclusions that lead to this procedure are derived in large part from the available data. Always take into account that the treatment will also be dependent on availability of resources and existing treatment guidelines, considered by other Pain Management Practitioners as being common knowledge and practice, at the time of the intervention. For Medico-Legal purposes, it is also important to point out that variation in procedural techniques and pharmacological choices are the acceptable norm. The indications, contraindications, technique, and results of the above procedure should only be interpreted and judged by a Board-Certified Interventional Pain Specialist with extensive familiarity and expertise in the same exact procedure and technique.

## 2019-01-31 NOTE — Patient Instructions (Signed)

## 2019-01-31 NOTE — Progress Notes (Signed)
Safety precautions to be maintained throughout the outpatient stay will include: orient to surroundings, keep bed in low position, maintain call bell within reach at all times, provide assistance with transfer out of bed and ambulation.  

## 2019-02-02 ENCOUNTER — Encounter: Payer: PRIVATE HEALTH INSURANCE | Admitting: Physical Therapy

## 2019-02-15 NOTE — Therapy (Signed)
[place] [facility] SERVICES [address] [place], [place], [address] Phone: [phone]   Fax:  [phone]  Patient Details  Name: Teresa [name] MRN: [id number] Date of Birth: [birth year] Referring Provider:  No ref. provider found  Encounter Date: 02/15/2019  The [facility] outpatient clinics are closed at this time due to the COVID-19 epidemic. The patient was contacted in regards to their therapy services. The patient is in agreement that they are safe and consent to being on hold for therapy services until the [facility] outpatient facilities reopen. At which time, the patient will be contacted to schedule an appointment to resume therapy services.   Patient reports she has been performing exercises at home and would like to return when clinic is reopenned.  Blythe Stanford, PT DPT 02/15/2019, 9:54 AM  [place] [facility] SERVICES [address] [place], [place], [address] Phone: [phone]   Fax:  [phone]

## 2019-02-17 ENCOUNTER — Encounter: Payer: PRIVATE HEALTH INSURANCE | Admitting: Student in an Organized Health Care Education/Training Program

## 2019-02-21 ENCOUNTER — Other Ambulatory Visit: Payer: Self-pay

## 2019-02-21 ENCOUNTER — Ambulatory Visit
Payer: PRIVATE HEALTH INSURANCE | Attending: Student in an Organized Health Care Education/Training Program | Admitting: Student in an Organized Health Care Education/Training Program

## 2019-02-21 DIAGNOSIS — M47816 Spondylosis without myelopathy or radiculopathy, lumbar region: Secondary | ICD-10-CM

## 2019-02-21 DIAGNOSIS — M5137 Other intervertebral disc degeneration, lumbosacral region: Secondary | ICD-10-CM

## 2019-02-21 DIAGNOSIS — G894 Chronic pain syndrome: Secondary | ICD-10-CM | POA: Diagnosis not present

## 2019-02-21 NOTE — Progress Notes (Signed)
Pain Management Virtual Encounter Note - Virtual Visit via [place] (real-time audio visits between healthcare provider and patient).  Patient's Phone No. & Preferred Pharmacy:  [phone] (home); [phone] (mobile); (Preferred) [phone] [email or web address]  [id number] - [place], [place] - [address] [place] [address] Phone: [phone] Fax: [phone]   Pre-screening note:  Our staff contacted Teresa [name] and offered her an "in person", "face-to-face" appointment versus a telephone encounter. She indicated preferring the telephone encounter, at this time.  Reason for Virtual Visit: COVID-19*  Social distancing based on CDC and AMA recommendations.   I contacted Teresa [name] on 02/21/2019 at 1:57 PM via video conference and clearly identified myself as Gillis Santa, MD. I verified that I was speaking with the correct person using two identifiers (Name and date of birth: [birth year]).  Advanced Informed Consent I sought verbal advanced consent from Teresa [name] for virtual visit interactions. I informed Teresa [name] of possible security and privacy concerns, risks, and limitations associated with providing "not-in-person" medical evaluation and management services. I also informed Teresa [name] of the availability of "in-person" appointments. Finally, I informed her that there would be a charge for the virtual visit and that she could be  personally, fully or partially, financially responsible for it. Teresa [name] expressed understanding and agreed to proceed.   Historic Elements   Teresa [name] is a 63 y.o. year old, female patient evaluated today after her last encounter by our practice on 01/31/2019. Teresa [name]  has a past medical history of Headache, History of fractured vertebra, Hyperlipidemia, Low bone density, and Transfusion of blood product refused for religious reason. She also  has a past surgical history that  includes Abdominal hysterectomy; Colonoscopy (2000); Eye surgery (Left, 2014); and Artery Biopsy (Left, 12/18/2015). Teresa [name] has a current medication list which includes the following prescription(s): alendronate, calcium, cholecalciferol, ezetimibe, ferrous gluconate, gabapentin, multivitamin, nortriptyline, topiramate, trazodone, zolpidem, diclofenac, flector, magnesium oxide, and propranolol. She  reports that she has never smoked. She has never used smokeless tobacco. She reports that she does not drink alcohol or use drugs. Teresa [name] is allergic to codeine and morphine.   HPI  I last saw her on 01/31/2019. She is being evaluated for a post-procedure assessment.  Post-Procedure Evaluation  Procedure: Bilateral L3,4,L5, S1 MBNB #1 Pre-procedure pain level:  2/10 Post-procedure: 0/10          Sedation: Please see nurses note.  Effectiveness during initial hour after procedure(Ultra-Short Term Relief): 100%  Local anesthetic used: Long-acting (4-6 hours) Effectiveness: Defined as any analgesic benefit obtained secondary to the administration of local anesthetics. This carries significant diagnostic value as to the etiological location, or anatomical origin, of the pain. Duration of benefit is expected to coincide with the duration of the local anesthetic used.  Effectiveness during initial 4-6 hours after procedure(Short-Term Relief): 100 %  Long-term benefit: Defined as any relief past the pharmacologic duration of the local anesthetics.  Effectiveness past the initial 6 hours after procedure(Long-Term Relief): 100%  Current benefits: Defined as benefit that persist at this time.   Analgesia:  Somewhat improved Function: Teresa [name] reports improvement in function ROM: Back to baseline  Pharmacotherapy Assessment   01/25/2019  2   01/19/2019  Hydrocodone-Acetamin 7.5-325  30.00 30 Bi Lat   [id number]   Nor (0921)   0  7.50 MME  Comm Ins        Monitoring: Pharmacotherapy: No  side-effects or  adverse reactions reported. [place] PMP: PDMP reviewed during this encounter.       Compliance: No problems identified or detected. Plan: Refer to "POC".  Review of recent tests  DG C-Arm 1-60 Min-No Report Fluoroscopy was utilized by the requesting physician.  No radiographic  interpretation.    Procedure visit on 12/27/2018  Component Date Value Ref Range Status  . Summary 12/27/2018 FINAL   Final   Comment: ==================================================================== [facility] COMP DRUG ANALYSIS,UR ==================================================================== Test                             Result       Flag       Units Drug Present and Declared for Prescription Verification   Gabapentin                     PRESENT      EXPECTED   Topiramate                     PRESENT      EXPECTED   Zolpidem                       PRESENT      EXPECTED   Zolpidem Acid                  PRESENT      EXPECTED    Zolpidem acid is an expected metabolite of zolpidem.   Nortriptyline                  PRESENT      EXPECTED    Nortriptyline may be administered as a prescription drug; it is    also an expected metabolite of amitriptyline.   Trazodone                      PRESENT      EXPECTED   1,3 chlorophenyl piperazine    PRESENT      EXPECTED    1,3-chlorophenyl piperazine is an expected metabolite of    trazodone.   Acetaminophen                  PRESENT      EXPECTED Drug Pr                          esent not Declared for Prescription Verification   Tramadol                       1895         UNEXPECTED ng/mg creat   O-Desmethyltramadol            2338         UNEXPECTED ng/mg creat   N-Desmethyltramadol            875          UNEXPECTED ng/mg creat    Source of tramadol is a prescription medication.    O-desmethyltramadol and N-desmethyltramadol are expected    metabolites of tramadol.   Lidocaine                      PRESENT      UNEXPECTED Drug Absent but  Declared for Prescription Verification   Hydrocodone  Not Detected UNEXPECTED ng/mg creat   Diclofenac                     Not Detected UNEXPECTED    Diclofenac, as indicated in the declared medication list, is not    always detected even when used as directed.   Propranolol                    Not Detected UNEXPECTED ==================================================================== Test                      Result    Flag   Units      Ref Range   Creatinine              136              mg                          /dL      >=20 ==================================================================== Declared Medications:  The flagging and interpretation on this report are based on the  following declared medications.  Unexpected results may arise from  inaccuracies in the declared medications.  **Note: The testing scope of this panel includes these medications:  Gabapentin  Hydrocodone (Hydrocodone-Acetaminophen)  Nortriptyline  Propranolol  Topiramate  Trazodone  **Note: The testing scope of this panel does not include small to  moderate amounts of these reported medications:  Acetaminophen (Hydrocodone-Acetaminophen)  Diclofenac  Zolpidem  **Note: The testing scope of this panel does not include following  reported medications:  Alendronate  Calcium  Cholecalciferol  Ezetimibe  Ferrous Gluconate  Magnesium  Multivitamin ==================================================================== For clinical consultation, please call [phone]. ===========================                          =========================================    Assessment  The primary encounter diagnosis was Lumbar facet arthropathy (L3/4 and L4/5). Diagnoses of Lumbar spondylosis, Degeneration of lumbar or lumbosacral intervertebral disc, and Chronic pain syndrome were also pertinent to this visit.  Postprocedural evaluation status post bilateral L3, L4, L5, S1 facet medial  branch nerve block #1 on 01/31/2019.  Patient endorses significant pain relief after diagnostic lumbar facet medial branch nerve block.  Rates it as approximately 75% for the first 2 weeks which is now decreased approximately 40 to 50%.  Patient has improved range of motion and functional status.  She has only utilized 1 hydrocodone that was prescribed to her at her last visit (quantity 30) given that her pain is better managed.  In discussing treatment plan, recommend repeating lumbar facet medial branch nerve block #2 at L3, L4, L5, S1 followed possibly by lumbar RFA.  Risks and benefits reviewed and patient would like to proceed.  Given that this is an elective procedure, I will place order and clinic staff will call and schedule patient when we are able to do so after COVID-19 restriction lifted for elective procedures.  Plan of Care  I am having Teresa [name] maintain her zolpidem, topiramate, CALCIUM PO, Cholecalciferol (VITAMIN D-3 PO), magnesium oxide, propranolol, ferrous gluconate, Flector, nortriptyline, traZODone, alendronate, gabapentin, EZETIMIBE PO, multivitamin, and diclofenac.  Pharmacotherapy (Medications Ordered): No orders of the defined types were placed in this encounter.  Orders:  Orders Placed This Encounter  Procedures  . LUMBAR FACET(MEDIAL BRANCH NERVE BLOCK) MBNB    Standing Status:   Future    Standing  Expiration Date:   03/23/2019    Scheduling Instructions:     Side: Bilateral     Level: L3-4, L4-5, & L5-S1 Facets ( L3, L4, L5, & S1 Medial Branch Nerves)     Sedation: With Sedation.     Timeframe: ASAA    Order Specific Question:   Where will this procedure be performed?    Answer:   [facility]   Follow-up plan:   Return for Procedure, After COVID-19 restrictions lifted.   I discussed the assessment and treatment plan with the patient. The patient was provided an opportunity to ask questions and all were answered. The patient agreed with the plan  and demonstrated an understanding of the instructions.  Patient advised to call back or seek an in-person evaluation if the symptoms or condition worsens.  Total duration of non-face-to-face encounter: 25 minutes.  Note by: Gillis Santa, MD Date: 02/21/2019; Time: 1:57 PM  Disclaimer:  * Given the special circumstances of the COVID-19 pandemic, the federal government has announced that the Office for Civil Rights (OCR) will exercise its enforcement discretion and will not impose penalties on physicians using telehealth in the event of noncompliance with regulatory requirements under the [place] and Accountability Act (HIPAA) in connection with the good faith provision of telehealth during the XQKSK-81 national public health emergency. ([place])

## 2019-02-28 ENCOUNTER — Telehealth: Payer: Self-pay | Admitting: Student in an Organized Health Care Education/Training Program

## 2019-02-28 NOTE — Telephone Encounter (Signed)
Pt called and would like to know if Dr. Holley Raring would fill out a claim form for her to cancel her trip to [place]. She states that she planned this trip a while ago but now her back is hurting her too bad to sit for a long period of time. She states that in order to get her flight refunded that it has to be a medical reason that she is cancelling the flight.

## 2019-02-28 NOTE — Telephone Encounter (Signed)
Probably something her PCP can do as he has known her longer and can comment on the chronicity and severity of her low back pain.My first visit with the patient was in February 2020.

## 2019-03-01 ENCOUNTER — Telehealth: Payer: Self-pay | Admitting: *Deleted

## 2019-03-01 NOTE — Telephone Encounter (Signed)
Called to let patient know DR Lateef's reply that she will need to contact her PCP in this matter.

## 2019-03-03 ENCOUNTER — Encounter: Payer: PRIVATE HEALTH INSURANCE | Admitting: Student in an Organized Health Care Education/Training Program

## 2019-03-08 ENCOUNTER — Encounter: Payer: Self-pay | Admitting: Student in an Organized Health Care Education/Training Program

## 2019-03-08 ENCOUNTER — Telehealth: Payer: Self-pay

## 2019-03-08 NOTE — Progress Notes (Signed)
Dr. Holley Raring, Ms. Kerrick said thanks for the letter, but it's not enough. She has e-mailed [employer] a form that she wants completed. [employer] will print it and put it on your desk if you want to fill it out. Thanks

## 2019-03-08 NOTE — Telephone Encounter (Signed)
Patient saw the letter in her mychart, but states that there is a form that we need to fill out. She emailed it to me and I printed it out and it's on your desk if you want to sign it. attatched to the form are the dates of service they need and they also need a dx. Thanks

## 2019-03-08 NOTE — Telephone Encounter (Signed)
She needs a note from [place] stating that she is unable to travel by plane due to her pain, in order to get a refund on her plane ticket. Is this something we can do for her?

## 2019-05-02 ENCOUNTER — Ambulatory Visit: Payer: PRIVATE HEALTH INSURANCE | Admitting: Student in an Organized Health Care Education/Training Program

## 2019-05-04 ENCOUNTER — Other Ambulatory Visit
Admission: RE | Admit: 2019-05-04 | Discharge: 2019-05-04 | Disposition: A | Discharge status: Home / Self Care | Payer: Medicare Other | Source: Ambulatory Visit | Attending: Student in an Organized Health Care Education/Training Program | Admitting: Student in an Organized Health Care Education/Training Program

## 2019-05-04 ENCOUNTER — Other Ambulatory Visit: Payer: Self-pay

## 2019-05-04 DIAGNOSIS — Z01812 Encounter for preprocedural laboratory examination: Secondary | ICD-10-CM | POA: Diagnosis present

## 2019-05-04 DIAGNOSIS — Z1159 Encounter for screening for other viral diseases: Secondary | ICD-10-CM | POA: Insufficient documentation

## 2019-05-04 LAB — SARS CORONAVIRUS 2 (TAT 6-24 HRS): SARS Coronavirus 2: NEGATIVE

## 2019-05-09 ENCOUNTER — Other Ambulatory Visit: Payer: Self-pay

## 2019-05-09 ENCOUNTER — Ambulatory Visit
Admission: RE | Admit: 2019-05-09 | Discharge: 2019-05-09 | Disposition: A | Discharge status: Home / Self Care | Payer: Medicare Other | Source: Ambulatory Visit | Attending: Student in an Organized Health Care Education/Training Program | Admitting: Student in an Organized Health Care Education/Training Program

## 2019-05-09 ENCOUNTER — Encounter: Payer: Self-pay | Admitting: Student in an Organized Health Care Education/Training Program

## 2019-05-09 ENCOUNTER — Ambulatory Visit (HOSPITAL_BASED_OUTPATIENT_CLINIC_OR_DEPARTMENT_OTHER): Payer: Medicare Other | Admitting: Student in an Organized Health Care Education/Training Program

## 2019-05-09 VITALS — BP 136/71 | HR 96 | Temp 97.6°F | Resp 15 | Ht 66.0 in | Wt 135.0 lb

## 2019-05-09 DIAGNOSIS — M47816 Spondylosis without myelopathy or radiculopathy, lumbar region: Secondary | ICD-10-CM

## 2019-05-09 MED ORDER — ROPIVACAINE HCL 2 MG/ML IJ SOLN: 1.0000 mL | Freq: Once | INTRAMUSCULAR | Status: DC

## 2019-05-09 MED ORDER — DEXAMETHASONE SODIUM PHOSPHATE 10 MG/ML IJ SOLN
10.0000 mg | Freq: Once | INTRAMUSCULAR | Status: AC
  Administered 2019-05-09: 10 mg

## 2019-05-09 MED ORDER — LIDOCAINE HCL 2 % IJ SOLN
INTRAMUSCULAR | Status: AC
  Filled 2019-05-09: qty 20

## 2019-05-09 MED ORDER — DEXAMETHASONE SODIUM PHOSPHATE 10 MG/ML IJ SOLN
INTRAMUSCULAR | Status: AC
  Filled 2019-05-09: qty 2

## 2019-05-09 MED ORDER — LIDOCAINE HCL 2 % IJ SOLN: 20.0000 mL | Freq: Once | INTRAMUSCULAR | Status: DC

## 2019-05-09 MED ORDER — ROPIVACAINE HCL 2 MG/ML IJ SOLN
INTRAMUSCULAR | Status: AC
  Filled 2019-05-09: qty 20

## 2019-05-09 NOTE — Progress Notes (Signed)
Patient's Name: Teresa [name]  MRN: [id number]  Referring Provider: Tracie Harrier, MD  DOB: Oct 14, 1956  PCP: Tracie Harrier, MD  DOS: 05/09/2019  Note by: Gillis Santa, MD  Service setting: Ambulatory outpatient  Specialty: Interventional Pain Management  Patient type: Established  Location: [facility] (AMB) Pain Management Facility  Visit type: Interventional Procedure   Primary Reason for Visit: Interventional Pain Management Treatment. CC: Back Pain (lower)  Procedure:          Anesthesia, Analgesia, Anxiolysis:  Type: Lumbar Facet, Medial Branch Block(s) #2  Primary Purpose: Diagnostic Region: Posterolateral Lumbosacral Spine Level: L3, L4, L5, & S1 Medial Branch Level(s). Injecting these levels blocks the L3-4, L4-5, and L5-S1 lumbar facet joints. Laterality: Bilateral  Type: Local Anesthesia  Local Anesthetic: Lidocaine 1-2%  Position: Prone   Indications: 1. Lumbar facet arthropathy (L3/4 and L4/5)   2. Lumbar spondylosis    Pain Score: Pre-procedure: 4 /10 Post-procedure: 2 /10  Pre-op Assessment:  Teresa [name] is a 63 y.o. (year old), female patient, seen today for interventional treatment. She  has a past surgical history that includes Abdominal hysterectomy; Colonoscopy (2000); Eye surgery (Left, 2014); and Artery Biopsy (Left, 12/18/2015). Teresa [name] has a current medication list which includes the following prescription(s): alendronate, calcium, cholecalciferol, ezetimibe, ferrous gluconate, flector, multivitamin, nortriptyline, propranolol, topiramate, trazodone, zolpidem, diclofenac, gabapentin, and magnesium oxide, and the following Facility-Administered Medications: lidocaine, ropivacaine (pf) 2 mg/ml (0.2%), and ropivacaine (pf) 2 mg/ml (0.2%). Her primarily concern today is the Back Pain (lower)  Initial Vital Signs:  Pulse/HCG Rate: 96ECG Heart Rate: 89 Temp: 97.6 F (36.4 C) Resp: 16 BP: 126/77 SpO2: 100 %  BMI: Estimated body mass index is 21.79 kg/m as  calculated from the following:   Height as of this encounter: 5\' 6"  (1.676 m).   Weight as of this encounter: 135 lb (61.2 kg).  Risk Assessment: Allergies: Reviewed. She is allergic to codeine and morphine.  Allergy Precautions: None required Coagulopathies: Reviewed. None identified.  Blood-thinner therapy: None at this time Active Infection(s): Reviewed. None identified. Teresa [name] is afebrile  Site Confirmation: Teresa [name] was asked to confirm the procedure and laterality before marking the site Procedure checklist: Completed Consent: Before the procedure and under the influence of no sedative(s), amnesic(s), or anxiolytics, the patient was informed of the treatment options, risks and possible complications. To fulfill our ethical and legal obligations, as recommended by the American Medical Association's Code of Ethics, I have informed the patient of my clinical impression; the nature and purpose of the treatment or procedure; the risks, benefits, and possible complications of the intervention; the alternatives, including doing nothing; the risk(s) and benefit(s) of the alternative treatment(s) or procedure(s); and the risk(s) and benefit(s) of doing nothing. The patient was provided information about the general risks and possible complications associated with the procedure. These may include, but are not limited to: failure to achieve desired goals, infection, bleeding, organ or nerve damage, allergic reactions, paralysis, and death. In addition, the patient was informed of those risks and complications associated to Spine-related procedures, such as failure to decrease pain; infection (i.e.: Meningitis, epidural or intraspinal abscess); bleeding (i.e.: epidural hematoma, subarachnoid hemorrhage, or any other type of intraspinal or peri-dural bleeding); organ or nerve damage (i.e.: Any type of peripheral nerve, nerve root, or spinal cord injury) with subsequent damage to sensory, motor, and/or  autonomic systems, resulting in permanent pain, numbness, and/or weakness of one or several areas of the body; allergic reactions; (i.e.: anaphylactic reaction); and/or death.  Furthermore, the patient was informed of those risks and complications associated with the medications. These include, but are not limited to: allergic reactions (i.e.: anaphylactic or anaphylactoid reaction(s)); adrenal axis suppression; blood sugar elevation that in diabetics may result in ketoacidosis or comma; water retention that in patients with history of congestive heart failure may result in shortness of breath, pulmonary edema, and decompensation with resultant heart failure; weight gain; swelling or edema; medication-induced neural toxicity; particulate matter embolism and blood vessel occlusion with resultant organ, and/or nervous system infarction; and/or aseptic necrosis of one or more joints. Finally, the patient was informed that Medicine is not an exact science; therefore, there is also the possibility of unforeseen or unpredictable risks and/or possible complications that may result in a catastrophic outcome. The patient indicated having understood very clearly. We have given the patient no guarantees and we have made no promises. Enough time was given to the patient to ask questions, all of which were answered to the patient's satisfaction. [name] has indicated that she wanted to continue with the procedure. Attestation: I, the ordering provider, attest that I have discussed with the patient the benefits, risks, side-effects, alternatives, likelihood of achieving goals, and potential problems during recovery for the procedure that I have provided informed consent. Date   Time: 05/09/2019  8:50 AM  Pre-Procedure Preparation:  Monitoring: As per clinic protocol. Respiration, ETCO2, SpO2, BP, heart rate and rhythm monitor placed and checked for adequate function Safety Precautions: Patient was assessed for positional  comfort and pressure points before starting the procedure. Time-out: I initiated and conducted the "Time-out" before starting the procedure, as per protocol. The patient was asked to participate by confirming the accuracy of the "Time Out" information. Verification of the correct person, site, and procedure were performed and confirmed by me, the nursing staff, and the patient. "Time-out" conducted as per Joint Commission's Universal Protocol ([email or web address].01.01.01). Time: 0935  Description of Procedure:          Laterality: Bilateral. The procedure was performed in identical fashion on both sides. Levels: L3, L4, L5, & S1 Medial Branch Level(s) Area Prepped: Posterior Lumbosacral Region Prepping solution: ChloraPrep (2% chlorhexidine gluconate and 70% isopropyl alcohol) Safety Precautions: Aspiration looking for blood return was conducted prior to all injections. At no point did we inject any substances, as a needle was being advanced. Before injecting, the patient was told to immediately notify me if she was experiencing any new onset of "ringing in the ears, or metallic taste in the mouth". No attempts were made at seeking any paresthesias. Safe injection practices and needle disposal techniques used. Medications properly checked for expiration dates. SDV (single dose vial) medications used. After the completion of the procedure, all disposable equipment used was discarded in the proper designated medical waste containers. Local Anesthesia: Protocol guidelines were followed. The patient was positioned over the fluoroscopy table. The area was prepped in the usual manner. The time-out was completed. The target area was identified using fluoroscopy. A 12-in long, straight, sterile hemostat was used with fluoroscopic guidance to locate the targets for each level blocked. Once located, the skin was marked with an approved surgical skin marker. Once all sites were marked, the skin (epidermis, dermis, and hypodermis),  as well as deeper tissues (fat, connective tissue and muscle) were infiltrated with a small amount of a short-acting local anesthetic, loaded on a 10cc syringe with a 25G, 1.5-in  Needle. An appropriate amount of time was allowed for local anesthetics to take effect before proceeding  to the next step. Local Anesthetic: Lidocaine 2.0% The unused portion of the local anesthetic was discarded in the proper designated containers. Technical explanation of process:   L3 Medial Branch Nerve Block (MBB): The target area for the L3 medial branch is at the junction of the postero-lateral aspect of the superior articular process and the superior, posterior, and medial edge of the transverse process of L4. Under fluoroscopic guidance, a Quincke needle was inserted until contact was made with os over the superior postero-lateral aspect of the pedicular shadow (target area). After negative aspiration for blood, 19mL of the nerve block solution was injected without difficulty or complication. The needle was removed intact. L4 Medial Branch Nerve Block (MBB): The target area for the L4 medial branch is at the junction of the postero-lateral aspect of the superior articular process and the superior, posterior, and medial edge of the transverse process of L5. Under fluoroscopic guidance, a Quincke needle was inserted until contact was made with os over the superior postero-lateral aspect of the pedicular shadow (target area). After negative aspiration for blood, 1 mL of the nerve block solution was injected without difficulty or complication. The needle was removed intact. L5 Medial Branch Nerve Block (MBB): The target area for the L5 medial branch is at the junction of the postero-lateral aspect of the superior articular process and the superior, posterior, and medial edge of the sacral ala. Under fluoroscopic guidance, a Quincke needle was inserted until contact was made with os over the superior postero-lateral aspect of the  pedicular shadow (target area). After negative aspiration for blood, 13mL of the nerve block solution was injected without difficulty or complication. The needle was removed intact. S1 Medial Branch Nerve Block (MBB): The target area for the S1 medial branch is at the posterior and inferior 6 o'clock position of the L5-S1 facet joint. Under fluoroscopic guidance, the Quincke needle inserted for the L5 MBB was redirected until contact was made with os over the inferior and postero aspect of the sacrum, at the 6 o' clock position under the L5-S1 facet joint (Target area). After negative aspiration for blood, 1 mL of the nerve block solution was injected without difficulty or complication. The needle was removed intact.  Nerve block solution: 8 cc solution made of 6 cc of 0.2% ropivacaine, 2 cc of Decadron 10 mg/cc.  1 cc injected at each level above bilaterally.  Total steroid dose: 20 mg Decadron Procedural Needles: 22-gauge, 3.5-inch, Quincke needles used for all levels.  Once the entire procedure was completed, the treated area was cleaned, making sure to leave some of the prepping solution back to take advantage of its long term bactericidal properties.   Illustration of the posterior view of the lumbar spine and the posterior neural structures. Laminae of L2 through S1 are labeled. DPRL5, dorsal primary ramus of L5; DPRS1, dorsal primary ramus of S1; DPR3, dorsal primary ramus of L3; FJ, facet (zygapophyseal) joint L3-L4; I, inferior articular process of L4; LB1, lateral branch of dorsal primary ramus of L1; IAB, inferior articular branches from L3 medial branch (supplies L4-L5 facet joint); IBP, intermediate branch plexus; MB3, medial branch of dorsal primary ramus of L3; NR3, third lumbar nerve root; S, superior articular process of L5; SAB, superior articular branches from L4 (supplies L4-5 facet joint also); TP3, transverse process of L3.  Vitals:   05/09/19 0941 05/09/19 0946 05/09/19 0951  05/09/19 0956  BP: 129/72 126/71 124/70 136/71  Pulse:      Resp: 16 20  14 15  Temp:      TempSrc:      SpO2: 100% 99% 99% 99%  Weight:      Height:         Start Time: 0935 hrs. End Time: 0936 hrs.  Imaging Guidance (Spinal):          Type of Imaging Technique: Fluoroscopy Guidance (Spinal) Indication(s): Assistance in needle guidance and placement for procedures requiring needle placement in or near specific anatomical locations not easily accessible without such assistance. Exposure Time: Please see nurses notes. Contrast: None used. Fluoroscopic Guidance: I was personally present during the use of fluoroscopy. "Tunnel Vision Technique" used to obtain the best possible view of the target area. Parallax error corrected before commencing the procedure. "Direction-depth-direction" technique used to introduce the needle under continuous pulsed fluoroscopy. Once target was reached, antero-posterior, oblique, and lateral fluoroscopic projection used confirm needle placement in all planes. Images permanently stored in EMR. Interpretation: No contrast injected. I personally interpreted the imaging intraoperatively. Adequate needle placement confirmed in multiple planes. Permanent images saved into the patient's record.  Antibiotic Prophylaxis:   Anti-infectives (From admission, onward)   None     Indication(s): None identified  Post-operative Assessment:  Post-procedure Vital Signs:  Pulse/HCG Rate: 9690 Temp: 97.6 F (36.4 C) Resp: 15 BP: 136/71 SpO2: 99 %  EBL: None  Complications: No immediate post-treatment complications observed by team, or reported by patient.  Note: The patient tolerated the entire procedure well. A repeat set of vitals were taken after the procedure and the patient was kept under observation following institutional policy, for this type of procedure. Post-procedural neurological assessment was performed, showing return to baseline, prior to discharge.  The patient was provided with post-procedure discharge instructions, including a section on how to identify potential problems. Should any problems arise concerning this procedure, the patient was given instructions to immediately contact us, at any time, without hesitation. In any case, we plan to contact the patient by telephone for a follow-up status report regarding this interventional procedure.  Comments:  No additional relevant information.  5 out of 5 strength bilateral lower extremity: Plantar flexion, dorsiflexion, knee flexion, knee extension.   Plan of Care  Orders:  Orders Placed This Encounter  Procedures   DG PAIN CLINIC C-ARM 1-60 MIN NO REPORT    Intraoperative interpretation by procedural physician at [place].    Standing Status:   Standing    Number of Occurrences:   1    Order Specific Question:   Reason for exam:    Answer:   Assistance in needle guidance and placement for procedures requiring needle placement in or near specific anatomical locations not easily accessible without such assistance.   Medications ordered for procedure: Meds ordered this encounter  Medications   lidocaine (XYLOCAINE) 2 % (with pres) injection 400 mg   ropivacaine (PF) 2 mg/mL (0.2%) (NAROPIN) injection 1 mL   dexamethasone (DECADRON) injection 10 mg   ropivacaine (PF) 2 mg/mL (0.2%) (NAROPIN) injection 1 mL   dexamethasone (DECADRON) injection 10 mg   Medications administered: We administered dexamethasone and dexamethasone.  See the medical record for exact dosing, route, and time of administration.  Disposition: Discharge home  Discharge Date & Time: 05/09/2019; 1006 hrs.   Follow-up plan:   Return in about 4 weeks (around 06/06/2019) for Post Procedure Evaluation, virtual.     Future Appointments  Date Time Provider [place]  06/07/2019 10:45 AM Gillis Santa, MD [facility] None   Primary  Care Physician: Tracie Harrier, MD Location: [facility]  Outpatient Pain Management Facility Note by: Gillis Santa, MD Date: 05/09/2019; Time: 12:08 PM  Disclaimer:  Medicine is not an exact science. The only guarantee in medicine is that nothing is guaranteed. It is important to note that the decision to proceed with this intervention was based on the information collected from the patient. The Data and conclusions were drawn from the patient's questionnaire, the interview, and the physical examination. Because the information was provided in large part by the patient, it cannot be guaranteed that it has not been purposely or unconsciously manipulated. Every effort has been made to obtain as much relevant data as possible for this evaluation. It is important to note that the conclusions that lead to this procedure are derived in large part from the available data. Always take into account that the treatment will also be dependent on availability of resources and existing treatment guidelines, considered by other Pain Management Practitioners as being common knowledge and practice, at the time of the intervention. For Medico-Legal purposes, it is also important to point out that variation in procedural techniques and pharmacological choices are the acceptable norm. The indications, contraindications, technique, and results of the above procedure should only be interpreted and judged by a Board-Certified Interventional Pain Specialist with extensive familiarity and expertise in the same exact procedure and technique.

## 2019-05-09 NOTE — Progress Notes (Signed)
Safety precautions to be maintained throughout the outpatient stay will include: orient to surroundings, keep bed in low position, maintain call bell within reach at all times, provide assistance with transfer out of bed and ambulation.  

## 2019-05-09 NOTE — Patient Instructions (Signed)

## 2019-05-10 ENCOUNTER — Telehealth: Payer: Self-pay

## 2019-05-10 NOTE — Telephone Encounter (Signed)
Patient stated that she was up all night walking with pain. She was told to give it a couple of days for the steroid to kick in and use heat today. Call the office back if she need too.

## 2019-05-12 ENCOUNTER — Other Ambulatory Visit: Payer: Medicare Other

## 2019-05-13 ENCOUNTER — Other Ambulatory Visit: Payer: Medicare Other

## 2019-05-16 ENCOUNTER — Ambulatory Visit: Payer: PRIVATE HEALTH INSURANCE | Admitting: Student in an Organized Health Care Education/Training Program

## 2019-06-06 ENCOUNTER — Encounter: Payer: Self-pay | Admitting: Student in an Organized Health Care Education/Training Program

## 2019-06-07 ENCOUNTER — Ambulatory Visit
Payer: Medicare Other | Attending: Student in an Organized Health Care Education/Training Program | Admitting: Student in an Organized Health Care Education/Training Program

## 2019-06-07 ENCOUNTER — Other Ambulatory Visit: Payer: Self-pay

## 2019-06-07 DIAGNOSIS — G894 Chronic pain syndrome: Secondary | ICD-10-CM

## 2019-06-07 DIAGNOSIS — M47816 Spondylosis without myelopathy or radiculopathy, lumbar region: Secondary | ICD-10-CM

## 2019-06-07 MED ORDER — BUPRENORPHINE 7.5 MCG/HR TD PTWK: 7.5000 ug/h | MEDICATED_PATCH | TRANSDERMAL | 1 refills | Status: AC

## 2019-06-07 NOTE — Progress Notes (Signed)
Pain Management Virtual Encounter Note - Virtual Visit via Telephone Telehealth (real-time audio visits between healthcare provider and patient).   Patient's Phone No. & Preferred Pharmacy:  [phone] (home); [phone] (mobile); (Preferred) [phone] [email or web address]  [id number] - [place], [place] - [address] [place] [address] Phone: [phone] Fax: [phone]    Pre-screening note:  Our staff contacted Ms. Capers and offered her an "in person", "face-to-face" appointment versus a telephone encounter. She indicated preferring the telephone encounter, at this time.   Reason for Virtual Visit: COVID-19*  Social distancing based on CDC and AMA recommendations.   I contacted Eloy End on 06/07/2019 via telephone.      I clearly identified myself as Gillis Santa, MD. I verified that I was speaking with the correct person using two identifiers (Name: SCOTTY PINDER, and date of birth: Jun 23, 1956).  Advanced Informed Consent I sought verbal advanced consent from Eloy End for virtual visit interactions. I informed Ms. Mullen of possible security and privacy concerns, risks, and limitations associated with providing "not-in-person" medical evaluation and management services. I also informed Ms. Whitehair of the availability of "in-person" appointments. Finally, I informed her that there would be a charge for the virtual visit and that she could be  personally, fully or partially, financially responsible for it. Ms. Nolet expressed understanding and agreed to proceed.   Historic Elements   Ms. AVELYN TOUCH is a 63 y.o. year old, female patient evaluated today after her last encounter by our practice on 05/10/2019. Ms. Mcnicholas  has a past medical history of Headache, History of fractured vertebra, Hyperlipidemia, Low bone density, and Transfusion of blood product refused for religious reason. She also  has a past surgical history that  includes Abdominal hysterectomy; Colonoscopy (2000); Eye surgery (Left, 2014); and Artery Biopsy (Left, 12/18/2015). Ms. Kugel has a current medication list which includes the following prescription(s): alendronate, calcium, cholecalciferol, diclofenac, ezetimibe, ferrous gluconate, flector, gabapentin, magnesium oxide, multivitamin, nortriptyline, topiramate, trazodone, zolpidem, buprenorphine, and propranolol. She  reports that she has never smoked. She has never used smokeless tobacco. She reports that she does not drink alcohol or use drugs. Ms. Vo is allergic to codeine and morphine.   HPI  Today, she is being contacted for a post-procedure assessment.  Evaluation of last interventional procedure  05/09/2019 Procedure: Bilateral L3,4,5 S1 Facet MBNB #2 Pre-procedure pain score:  4/10 Post-procedure pain score: 2/10         Influential Factors: Intra-procedural challenges: None observed.         Reported side-effects: None.        Post-procedural adverse reactions or complications: None reported         Sedation: Please see nurses note for DOS. When no sedatives are used, the analgesic levels obtained are directly associated to the effectiveness of the local anesthetics. However, when sedation is provided, the level of analgesia obtained during the initial 1 hour following the intervention, is believed to be the result of a combination of factors. These factors may include, but are not limited to: 1. The effectiveness of the local anesthetics used. 2. The effects of the analgesic(s) and/or anxiolytic(s) used. 3. The degree of discomfort experienced by the patient at the time of the procedure. 4. The patients ability and reliability in recalling and recording the events. 5. The presence and influence of possible secondary gains and/or psychosocial factors. Reported result: Relief experienced during the 1st hour after the procedure: 100 % (  Ultra-Short Term Relief)            Interpretative  annotation: Clinically appropriate result. Analgesia during this period is likely to be Local Anesthetic and/or IV Sedative (Analgesic/Anxiolytic) related.          Effects of local anesthetic: The analgesic effects attained during this period are directly associated to the localized infiltration of local anesthetics and therefore cary significant diagnostic value as to the etiological location, or anatomical origin, of the pain. Expected duration of relief is directly dependent on the pharmacodynamics of the local anesthetic used. Long-acting (4-6 hours) anesthetics used.  Reported result: Relief during the next 4 to 6 hour after the procedure: 100 %(numbness was gone after a couple of hours) (Short-Term Relief)            Interpretative annotation: Clinically appropriate result. Analgesia during this period is likely to be Local Anesthetic-related.          Long-term benefit: Defined as the period of time past the expected duration of local anesthetics (1 hour for short-acting and 4-6 hours for long-acting). With the possible exception of prolonged sympathetic blockade from the local anesthetics, benefits during this period are typically attributed to, or associated with, other factors such as analgesic sensory neuropraxia, antiinflammatory effects, or beneficial biochemical changes provided by agents other than the local anesthetics.  Reported result: Extended relief following procedure: 50 %(sitting for more than 5 minutes causes a lot of pain.) (Long-Term Relief)            Interpretative annotation: Clinically appropriate result. Good relief. No permanent benefit expected. Inflammation plays a part in the etiology to the pain.          PLAN FOR RFA  UDS:  Summary  Date Value Ref Range Status  12/27/2018 FINAL  Final    Comment:    ==================================================================== [facility] COMP DRUG  ANALYSIS,UR ==================================================================== Test                             Result       Flag       Units Drug Present and Declared for Prescription Verification   Gabapentin                     PRESENT      EXPECTED   Topiramate                     PRESENT      EXPECTED   Zolpidem                       PRESENT      EXPECTED   Zolpidem Acid                  PRESENT      EXPECTED    Zolpidem acid is an expected metabolite of zolpidem.   Nortriptyline                  PRESENT      EXPECTED    Nortriptyline may be administered as a prescription drug; it is    also an expected metabolite of amitriptyline.   Trazodone                      PRESENT      EXPECTED   1,3 chlorophenyl piperazine    PRESENT  EXPECTED    1,3-chlorophenyl piperazine is an expected metabolite of    trazodone.   Acetaminophen                  PRESENT      EXPECTED Drug Present not Declared for Prescription Verification   Tramadol                       1895         UNEXPECTED ng/mg creat   O-Desmethyltramadol            2338         UNEXPECTED ng/mg creat   N-Desmethyltramadol            875          UNEXPECTED ng/mg creat    Source of tramadol is a prescription medication.    O-desmethyltramadol and N-desmethyltramadol are expected    metabolites of tramadol.   Lidocaine                      PRESENT      UNEXPECTED Drug Absent but Declared for Prescription Verification   Hydrocodone                    Not Detected UNEXPECTED ng/mg creat   Diclofenac                     Not Detected UNEXPECTED    Diclofenac, as indicated in the declared medication list, is not    always detected even when used as directed.   Propranolol                    Not Detected UNEXPECTED ==================================================================== Test                      Result    Flag   Units      Ref Range   Creatinine              136              mg/dL       >=20 ==================================================================== Declared Medications:  The flagging and interpretation on this report are based on the  following declared medications.  Unexpected results may arise from  inaccuracies in the declared medications.  **Note: The testing scope of this panel includes these medications:  Gabapentin  Hydrocodone (Hydrocodone-Acetaminophen)  Nortriptyline  Propranolol  Topiramate  Trazodone  **Note: The testing scope of this panel does not include small to  moderate amounts of these reported medications:  Acetaminophen (Hydrocodone-Acetaminophen)  Diclofenac  Zolpidem  **Note: The testing scope of this panel does not include following  reported medications:  Alendronate  Calcium  Cholecalciferol  Ezetimibe  Ferrous Gluconate  Magnesium  Multivitamin ==================================================================== For clinical consultation, please call [phone]. ====================================================================    Laboratory Chemistry Profile (12 mo)  Renal: No results found for requested labs within last 8760 hours.  Hepatic: No results found for requested labs within last 8760 hours. Other: No results found for requested labs within last 8760 hours. Note: Above Lab results reviewed.   Assessment  The primary encounter diagnosis was Lumbar facet arthropathy (L3/4 and L4/5). Diagnoses of Lumbar spondylosis and Chronic pain syndrome were also pertinent to this visit.  Plan of Care  I am having Eloy End start on Buprenorphine. I am also having her maintain her zolpidem, topiramate, CALCIUM  PO, Cholecalciferol (VITAMIN D-3 PO), magnesium oxide, propranolol, ferrous gluconate, Flector, nortriptyline, traZODone, alendronate, gabapentin, EZETIMIBE PO, multivitamin, and diclofenac.  PMP checked and appropriate. Butrans patch as below. Significant benefit after diagnostic lumbar facet medial  branch #1.  Mild-moderate benefit after diagnostic facet medial branch nerve block #2.  Risks and benefits of lumbar radiofrequency ablation discussed with patient.  She knows that she has limited options at this point.  Risk and benefits of RFA reviewed and patient would like to proceed.  We will start with left side first.  Pharmacotherapy (Medications Ordered): Meds ordered this encounter  Medications  . Buprenorphine 7.5 MCG/HR PTWK    Sig: Place 7.5 mcg/hr onto the skin every 7 (seven) days.    Dispense:  4 patch    Refill:  1    Do not place this medication, or any other prescription from our practice, on "Automatic Refill". Patient may have prescription filled one day early if pharmacy is closed on scheduled refill date.   Orders:  Orders Placed This Encounter  Procedures  . Radiofrequency,Lumbar    Standing Status:   Future    Standing Expiration Date:   12/07/2020    Scheduling Instructions:     Side(s): Left-sided     Level: L3-4, L4-5, & L5-S1 Facets (L3, L4, L5, & S1 Medial Branch Nerves)     Sedation: With Sedation     Scheduling Timeframe: As soon as pre-approved    Order Specific Question:   Where will this procedure be performed?    Answer:   [facility]   Follow-up plan:   Return for Procedure LEFT L3,4,5 S1 RFA with sedation.        Recent Visits Date Type Provider Dept  05/09/19 Procedure visit Gillis Santa, MD [facility]  Showing recent visits within past 90 days and meeting all other requirements   Today's Visits Date Type Provider Dept  06/07/19 Office Visit Gillis Santa, MD [facility]  Showing today's visits and meeting all other requirements   Future Appointments No visits were found meeting these conditions.  Showing future appointments within next 90 days and meeting all other requirements   I discussed the assessment and treatment plan with the patient. The patient was provided an opportunity to ask questions and all  were answered. The patient agreed with the plan and demonstrated an understanding of the instructions.  Patient advised to call back or seek an in-person evaluation if the symptoms or condition worsens.  Total duration of non-face-to-face encounter: 25 minutes.  Note by: Gillis Santa, MD Date: 06/07/2019; Time: 12:59 PM  Note: This dictation was prepared with Dragon dictation. Any transcriptional errors that may result from this process are unintentional.  Disclaimer:  * Given the special circumstances of the COVID-19 pandemic, the federal government has announced that the Office for Civil Rights (OCR) will exercise its enforcement discretion and will not impose penalties on physicians using telehealth in the event of noncompliance with regulatory requirements under the [place] (HIPAA) in connection with the good faith provision of telehealth during the FHLKT-62 national public health emergency. ([place])

## 2019-06-14 ENCOUNTER — Other Ambulatory Visit: Payer: Medicare Other

## 2019-06-15 ENCOUNTER — Telehealth: Payer: Self-pay

## 2019-06-15 ENCOUNTER — Other Ambulatory Visit: Payer: Self-pay

## 2019-06-15 ENCOUNTER — Ambulatory Visit (HOSPITAL_BASED_OUTPATIENT_CLINIC_OR_DEPARTMENT_OTHER): Payer: Medicare Other | Admitting: Student in an Organized Health Care Education/Training Program

## 2019-06-15 ENCOUNTER — Encounter: Payer: Self-pay | Admitting: Student in an Organized Health Care Education/Training Program

## 2019-06-15 ENCOUNTER — Ambulatory Visit
Admission: RE | Admit: 2019-06-15 | Discharge: 2019-06-15 | Disposition: A | Discharge status: Home / Self Care | Payer: Medicare Other | Source: Ambulatory Visit | Attending: Student in an Organized Health Care Education/Training Program | Admitting: Student in an Organized Health Care Education/Training Program

## 2019-06-15 DIAGNOSIS — M47816 Spondylosis without myelopathy or radiculopathy, lumbar region: Secondary | ICD-10-CM

## 2019-06-15 MED ORDER — ROPIVACAINE HCL 2 MG/ML IJ SOLN
2.0000 mL | Freq: Once | INTRAMUSCULAR | Status: AC
  Administered 2019-06-15: 10 mL via EPIDURAL

## 2019-06-15 MED ORDER — ROPIVACAINE HCL 2 MG/ML IJ SOLN
INTRAMUSCULAR | Status: AC
  Filled 2019-06-15: qty 10

## 2019-06-15 MED ORDER — DEXAMETHASONE SODIUM PHOSPHATE 10 MG/ML IJ SOLN: 10.0000 mg | Freq: Once | INTRAMUSCULAR | Status: DC

## 2019-06-15 MED ORDER — DEXAMETHASONE SODIUM PHOSPHATE 10 MG/ML IJ SOLN
INTRAMUSCULAR | Status: AC
  Filled 2019-06-15: qty 1

## 2019-06-15 MED ORDER — FENTANYL CITRATE (PF) 100 MCG/2ML IJ SOLN
25.0000 ug | INTRAMUSCULAR | Status: DC | PRN
  Administered 2019-06-15: 75 ug via INTRAVENOUS

## 2019-06-15 MED ORDER — LIDOCAINE HCL 2 % IJ SOLN
20.0000 mL | Freq: Once | INTRAMUSCULAR | Status: AC
  Administered 2019-06-15: 400 mg

## 2019-06-15 MED ORDER — FENTANYL CITRATE (PF) 100 MCG/2ML IJ SOLN
INTRAMUSCULAR | Status: AC
  Filled 2019-06-15: qty 2

## 2019-06-15 MED ORDER — IOHEXOL 180 MG/ML  SOLN: 10.0000 mL | Freq: Once | INTRAMUSCULAR | Status: DC

## 2019-06-15 MED ORDER — LIDOCAINE HCL 2 % IJ SOLN
INTRAMUSCULAR | Status: AC
  Filled 2019-06-15: qty 20

## 2019-06-15 MED ORDER — MIDAZOLAM HCL 5 MG/5ML IJ SOLN
1.0000 mg | INTRAMUSCULAR | Status: DC | PRN
  Administered 2019-06-15: 0.05 mg via INTRAVENOUS
  Filled 2019-06-15: qty 5

## 2019-06-15 MED ORDER — ROPIVACAINE HCL 2 MG/ML IJ SOLN
1.0000 mL | Freq: Once | INTRAMUSCULAR | Status: AC
  Administered 2019-06-15: 10 mL via EPIDURAL

## 2019-06-15 MED ORDER — DEXAMETHASONE SODIUM PHOSPHATE 10 MG/ML IJ SOLN
10.0000 mg | Freq: Once | INTRAMUSCULAR | Status: AC
  Administered 2019-06-15: 09:00:00 10 mg

## 2019-06-15 NOTE — Patient Instructions (Addendum)
Check with your pharmacy what the cost is for [place] patches.  ____________________________________________________________________________________________  Post-Procedure Discharge Instructions  Instructions:  Apply ice:   Purpose: This will minimize any swelling and discomfort after procedure.   When: Day of procedure, as soon as you get home.  How: Fill a plastic sandwich bag with crushed ice. Cover it with a small towel and apply to injection site.  How long: (15 min on, 15 min off) Apply for 15 minutes then remove x 15 minutes.  Repeat sequence on day of procedure, until you go to bed.  Apply heat:   Purpose: To treat any soreness and discomfort from the procedure.  When: Starting the next day after the procedure.  How: Apply heat to procedure site starting the day following the procedure.  How long: May continue to repeat daily, until discomfort goes away.  Food intake: Start with clear liquids (like water) and advance to regular food, as tolerated.   Physical activities: Keep activities to a minimum for the first 8 hours after the procedure. After that, then as tolerated.  Driving: If you have received any sedation, be responsible and do not drive. You are not allowed to drive for 24 hours after having sedation.  Blood thinner: (Applies only to those taking blood thinners) You may restart your blood thinner 6 hours after your procedure.  Insulin: (Applies only to Diabetic patients taking insulin) As soon as you can eat, you may resume your normal dosing schedule.  Infection prevention: Keep procedure site clean and dry. Shower daily and clean area with soap and water.  Post-procedure Pain Diary: Extremely important that this be done correctly and accurately. Recorded information will be used to determine the next step in treatment. For the purpose of accuracy, follow these rules:  Evaluate only the area treated. Do not report or include pain from an untreated area.  For the purpose of this evaluation, ignore all other areas of pain, except for the treated area.  After your procedure, avoid taking a long nap and attempting to complete the pain diary after you wake up. Instead, set your alarm clock to go off every hour, on the hour, for the initial 8 hours after the procedure. Document the duration of the numbing medicine, and the relief you are getting from it.  Do not go to sleep and attempt to complete it later. It will not be accurate. If you received sedation, it is likely that you were given a medication that may cause amnesia. Because of this, completing the diary at a later time may cause the information to be inaccurate. This information is needed to plan your care.  Follow-up appointment: Keep your post-procedure follow-up evaluation appointment after the procedure (usually 2 weeks for most procedures, 6 weeks for radiofrequencies). DO NOT FORGET to bring you pain diary with you.   Expect: (What should I expect to see with my procedure?)  From numbing medicine (AKA: Local Anesthetics): Numbness or decrease in pain. You may also experience some weakness, which if present, could last for the duration of the local anesthetic.  Onset: Full effect within 15 minutes of injected.  Duration: It will depend on the type of local anesthetic used. On the average, 1 to 8 hours.   From steroids (Applies only if steroids were used): Decrease in swelling or inflammation. Once inflammation is improved, relief of the pain will follow.  Onset of benefits: Depends on the amount of swelling present. The more swelling, the longer it will take for  the benefits to be seen. In some cases, up to 10 days.  Duration: Steroids will stay in the system x 2 weeks. Duration of benefits will depend on multiple posibilities including persistent irritating factors.  Side-effects: If present, they may typically last 2 weeks (the duration of the steroids).  Frequent: Cramps (if they  occur, drink Gatorade and take over-the-counter Magnesium 450-500 mg once to twice a day); water retention with temporary weight gain; increases in blood sugar; decreased immune system response; increased appetite.  Occasional: Facial flushing (red, warm cheeks); mood swings; menstrual changes.  Uncommon: Long-term decrease or suppression of natural hormones; bone thinning. (These are more common with higher doses or more frequent use. This is why we prefer that our patients avoid having any injection therapies in other practices.)   Very Rare: Severe mood changes; psychosis; aseptic necrosis.  From procedure: Some discomfort is to be expected once the numbing medicine wears off. This should be minimal if ice and heat are applied as instructed.  Call if: (When should I call?)  You experience numbness and weakness that gets worse with time, as opposed to wearing off.  New onset bowel or bladder incontinence. (Applies only to procedures done in the spine)  Emergency Numbers:  Durning business hours (Monday - Thursday, 8:00 AM - 4:00 PM) (Friday, 9:00 AM - 12:00 Noon): [phone]  After hours: [phone]  NOTE: If you are having a problem and are unable connect with, or to talk to a provider, then go to your nearest urgent care or emergency department. If the problem is serious and urgent, please call 911. ____________________________________________________________________________________________

## 2019-06-15 NOTE — Progress Notes (Signed)
Patient's Name: Teresa [name]  MRN: [id number]  Referring Provider: Gillis Santa, MD  DOB: 08-26-1956  PCP: Tracie Harrier, MD  DOS: 06/15/2019  Note by: Gillis Santa, MD  Service setting: Ambulatory outpatient  Specialty: Interventional Pain Management  Patient type: Established  Location: [facility] (AMB) Pain Management Facility  Visit type: Interventional Procedure   Primary Reason for Visit: Interventional Pain Management Treatment. CC: Back Pain (low)  Procedure:          Anesthesia, Analgesia, Anxiolysis:  Type: Thermal Lumbar Facet, Medial Branch Radiofrequency Ablation/Neurotomy  #1  Primary Purpose: Therapeutic Region: Posterolateral Lumbosacral Spine Level:  L3, L4, L5, & S1 Medial Branch Level(s). These levels will denervate the L3-4, L4-5, and the L5-S1 lumbar facet joints. Laterality: Left  Type: Moderate (Conscious) Sedation combined with Local Anesthesia Indication(s): Analgesia and Anxiety Route: Intravenous (IV) IV Access: Secured Sedation: Meaningful verbal contact was maintained at all times during the procedure  Local Anesthetic: Lidocaine 1-2%  Position: Prone   Indications: 1. Lumbar spondylosis    Ms. Spade has been dealing with the above chronic pain for longer than three months and has either failed to respond, was unable to tolerate, or simply did not get enough benefit from other more conservative therapies including, but not limited to: 1. Over-the-counter medications 2. Anti-inflammatory medications 3. Muscle relaxants 4. Membrane stabilizers 5. Opioids 6. Physical therapy and/or chiropractic manipulation 7. Modalities (Heat, ice, etc.) 8. Invasive techniques such as nerve blocks. Ms. Kaster has attained more than 50% relief of the pain from a series of diagnostic injections conducted in separate occasions.  Pain Score: Pre-procedure: 2 /10 Post-procedure: 1 /10  Pre-op Assessment:  Ms. Delage is a 63 y.o. (year old), female patient, seen today  for interventional treatment. She  has a past surgical history that includes Abdominal hysterectomy; Colonoscopy (2000); Eye surgery (Left, 2014); and Artery Biopsy (Left, 12/18/2015). Ms. Voges has a current medication list which includes the following prescription(s): alendronate, buprenorphine, calcium, cholecalciferol, diclofenac, ezetimibe, ferrous gluconate, flector, gabapentin, magnesium oxide, multivitamin, nortriptyline, topiramate, trazodone, zolpidem, and propranolol, and the following Facility-Administered Medications: fentanyl, iohexol, and midazolam. Her primarily concern today is the Back Pain (low)  Initial Vital Signs:  Pulse/HCG Rate: 91ECG Heart Rate: 85 Temp: 98.4 F (36.9 C) Resp: 18 BP: 124/68 SpO2: 100 %  BMI: Estimated body mass index is 21.79 kg/m as calculated from the following:   Height as of this encounter: 5\' 6"  (1.676 m).   Weight as of this encounter: 135 lb (61.2 kg).  Risk Assessment: Allergies: Reviewed. She is allergic to codeine and morphine.  Allergy Precautions: None required Coagulopathies: Reviewed. None identified.  Blood-thinner therapy: None at this time Active Infection(s): Reviewed. None identified. Ms. Cavness is afebrile  Site Confirmation: Ms. Riecke was asked to confirm the procedure and laterality before marking the site Procedure checklist: Completed Consent: Before the procedure and under the influence of no sedative(s), amnesic(s), or anxiolytics, the patient was informed of the treatment options, risks and possible complications. To fulfill our ethical and legal obligations, as recommended by the American Medical Association's Code of Ethics, I have informed the patient of my clinical impression; the nature and purpose of the treatment or procedure; the risks, benefits, and possible complications of the intervention; the alternatives, including doing nothing; the risk(s) and benefit(s) of the alternative treatment(s) or procedure(s); and  the risk(s) and benefit(s) of doing nothing. The patient was provided information about the general risks and possible complications associated with the procedure. These may  include, but are not limited to: failure to achieve desired goals, infection, bleeding, organ or nerve damage, allergic reactions, paralysis, and death. In addition, the patient was informed of those risks and complications associated to Spine-related procedures, such as failure to decrease pain; infection (i.e.: Meningitis, epidural or intraspinal abscess); bleeding (i.e.: epidural hematoma, subarachnoid hemorrhage, or any other type of intraspinal or peri-dural bleeding); organ or nerve damage (i.e.: Any type of peripheral nerve, nerve root, or spinal cord injury) with subsequent damage to sensory, motor, and/or autonomic systems, resulting in permanent pain, numbness, and/or weakness of one or several areas of the body; allergic reactions; (i.e.: anaphylactic reaction); and/or death. Furthermore, the patient was informed of those risks and complications associated with the medications. These include, but are not limited to: allergic reactions (i.e.: anaphylactic or anaphylactoid reaction(s)); adrenal axis suppression; blood sugar elevation that in diabetics may result in ketoacidosis or comma; water retention that in patients with history of congestive heart failure may result in shortness of breath, pulmonary edema, and decompensation with resultant heart failure; weight gain; swelling or edema; medication-induced neural toxicity; particulate matter embolism and blood vessel occlusion with resultant organ, and/or nervous system infarction; and/or aseptic necrosis of one or more joints. Finally, the patient was informed that Medicine is not an exact science; therefore, there is also the possibility of unforeseen or unpredictable risks and/or possible complications that may result in a catastrophic outcome. The patient indicated having  understood very clearly. We have given the patient no guarantees and we have made no promises. Enough time was given to the patient to ask questions, all of which were answered to the patient's satisfaction. Ms. Pensinger has indicated that she wanted to continue with the procedure. Attestation: I, the ordering provider, attest that I have discussed with the patient the benefits, risks, side-effects, alternatives, likelihood of achieving goals, and potential problems during recovery for the procedure that I have provided informed consent. Date   Time: 06/15/2019  8:04 AM  Pre-Procedure Preparation:  Monitoring: As per clinic protocol. Respiration, ETCO2, SpO2, BP, heart rate and rhythm monitor placed and checked for adequate function Safety Precautions: Patient was assessed for positional comfort and pressure points before starting the procedure. Time-out: I initiated and conducted the "Time-out" before starting the procedure, as per protocol. The patient was asked to participate by confirming the accuracy of the "Time Out" information. Verification of the correct person, site, and procedure were performed and confirmed by me, the nursing staff, and the patient. "Time-out" conducted as per Joint Commission's Universal Protocol (UP.01.01.01). Time: [phone]  Description of Procedure:          Laterality: Left Levels:   L3, L4, L5, & S1 Medial Branch Level(s), at the L3-4, L4-5, and the L5-S1 lumbar facet joints. Area Prepped: Lumbosacral Prepping solution: DuraPrep (Iodine Povacrylex [0.7% available iodine] and Isopropyl Alcohol, 74% w/w) Safety Precautions: Aspiration looking for blood return was conducted prior to all injections. At no point did we inject any substances, as a needle was being advanced. Before injecting, the patient was told to immediately notify me if she was experiencing any new onset of "ringing in the ears, or metallic taste in the mouth". No attempts were made at seeking any  paresthesias. Safe injection practices and needle disposal techniques used. Medications properly checked for expiration dates. SDV (single dose vial) medications used. After the completion of the procedure, all disposable equipment used was discarded in the proper designated medical waste containers. Local Anesthesia: Protocol guidelines were followed. The  patient was positioned over the fluoroscopy table. The area was prepped in the usual manner. The time-out was completed. The target area was identified using fluoroscopy. A 12-in long, straight, sterile hemostat was used with fluoroscopic guidance to locate the targets for each level blocked. Once located, the skin was marked with an approved surgical skin marker. Once all sites were marked, the skin (epidermis, dermis, and hypodermis), as well as deeper tissues (fat, connective tissue and muscle) were infiltrated with a small amount of a short-acting local anesthetic, loaded on a 10cc syringe with a 25G, 1.5-in  Needle. An appropriate amount of time was allowed for local anesthetics to take effect before proceeding to the next step. Local Anesthetic: Lidocaine 2.0% The unused portion of the local anesthetic was discarded in the proper designated containers. Technical explanation of process:  Radiofrequency Ablation (RFA)  L3 Medial Branch Nerve RFA: The target area for the L3 medial branch is at the junction of the postero-lateral aspect of the superior articular process and the superior, posterior, and medial edge of the transverse process of L4. Under fluoroscopic guidance, a Radiofrequency needle was inserted until contact was made with os over the superior postero-lateral aspect of the pedicular shadow (target area). Sensory and motor testing was conducted to properly adjust the position of the needle. Once satisfactory placement of the needle was achieved, the numbing solution was slowly injected after negative aspiration for blood. 39mL of the nerve  block solution was injected without difficulty or complication. After waiting for at least 3 minutes, the ablation was performed. Once completed, the needle was removed intact. L4 Medial Branch Nerve RFA: The target area for the L4 medial branch is at the junction of the postero-lateral aspect of the superior articular process and the superior, posterior, and medial edge of the transverse process of L5. Under fluoroscopic guidance, a Radiofrequency needle was inserted until contact was made with os over the superior postero-lateral aspect of the pedicular shadow (target area). Sensory and motor testing was conducted to properly adjust the position of the needle. Once satisfactory placement of the needle was achieved, the numbing solution was slowly injected after negative aspiration for blood. 1 mL of the nerve block solution was injected without difficulty or complication. After waiting for at least 3 minutes, the ablation was performed. Once completed, the needle was removed intact. L5 Medial Branch Nerve RFA: The target area for the L5 medial branch is at the junction of the postero-lateral aspect of the superior articular process of S1 and the superior, posterior, and medial edge of the sacral ala. Under fluoroscopic guidance, a Radiofrequency needle was inserted until contact was made with os over the superior postero-lateral aspect of the pedicular shadow (target area). Sensory and motor testing was conducted to properly adjust the position of the needle. Once satisfactory placement of the needle was achieved, the numbing solution was slowly injected after negative aspiration for blood. 78mL of the nerve block solution was injected without difficulty or complication. After waiting for at least 3 minutes, the ablation was performed. Once completed, the needle was removed intact. S1 Medial Branch Nerve RFA: The target area for the S1 medial branch is located inferior to the junction of the S1 superior  articular process and the L5 inferior articular process, posterior, inferior, and lateral to the 6 o'clock position of the L5-S1 facet joint, just superior to the S1 posterior foramen. Under fluoroscopic guidance, the Radiofrequency needle was advanced until contact was made with os over the Target area.  Sensory and motor testing was conducted to properly adjust the position of the needle. Once satisfactory placement of the needle was achieved, the numbing solution was slowly injected after negative aspiration for blood.78mL of the nerve block solution was injected without difficulty or complication. After waiting for at least 3 minutes, the ablation was performed. Once completed, the needle was removed intact.  Radiofrequency lesioning (ablation):  Radiofrequency Generator: NeuroTherm [id number] Sensory Stimulation Parameters: 50 Hz was used to locate & identify the nerve, making sure that the needle was positioned such that there was no sensory stimulation below 0.3 V or above 0.7 V. Motor Stimulation Parameters: 2 Hz was used to evaluate the motor component. Care was taken not to lesion any nerves that demonstrated motor stimulation of the lower extremities at an output of less than 2.5 times that of the sensory threshold, or a maximum of 2.0 V. Lesioning Technique Parameters: Standard Radiofrequency settings. (Not bipolar or pulsed.) Temperature Settings: 80 degrees C Lesioning time: 60 seconds Intra-operative Compliance: Compliant Materials & Medications: Needle(s) (Electrode/Cannula) Type: Teflon-coated, curved tip, Radiofrequency needle(s) Gauge: 22G Length: 10cm Numbing solution: 6 cc solution made of 4 cc of 0.2% ropivacaine, 2 cc of Decadron 10 mg/cc.  1 to 1.5 cc injected at each level above on the left after sensorimotor testing prior to lesioning.  The unused portion of the solution was discarded in the proper designated containers.  Once the entire procedure was completed, the treated area  was cleaned, making sure to leave some of the prepping solution back to take advantage of its long term bactericidal properties.  Illustration of the posterior view of the lumbar spine and the posterior neural structures. Laminae of L2 through S1 are labeled. DPRL5, dorsal primary ramus of L5; DPRS1, dorsal primary ramus of S1; DPR3, dorsal primary ramus of L3; FJ, facet (zygapophyseal) joint L3-L4; I, inferior articular process of L4; LB1, lateral branch of dorsal primary ramus of L1; IAB, inferior articular branches from L3 medial branch (supplies L4-L5 facet joint); IBP, intermediate branch plexus; MB3, medial branch of dorsal primary ramus of L3; NR3, third lumbar nerve root; S, superior articular process of L5; SAB, superior articular branches from L4 (supplies L4-5 facet joint also); TP3, transverse process of L3.  Vitals:   06/15/19 0916 06/15/19 0922 06/15/19 0932 06/15/19 0942  BP: 117/63 139/81 118/73 122/68  Pulse:      Resp: 11 16 10 17   Temp:  (!) 97.5 F (36.4 C)    TempSrc:  Temporal    SpO2: 100% 93% 100% 100%  Weight:      Height:        Start Time: 0843 hrs. End Time: 0914 hrs.  Imaging Guidance (Spinal):          Type of Imaging Technique: Fluoroscopy Guidance (Spinal) Indication(s): Assistance in needle guidance and placement for procedures requiring needle placement in or near specific anatomical locations not easily accessible without such assistance. Exposure Time: Please see nurses notes. Contrast: None used. Fluoroscopic Guidance: I was personally present during the use of fluoroscopy. "Tunnel Vision Technique" used to obtain the best possible view of the target area. Parallax error corrected before commencing the procedure. "Direction-depth-direction" technique used to introduce the needle under continuous pulsed fluoroscopy. Once target was reached, antero-posterior, oblique, and lateral fluoroscopic projection used confirm needle placement in all planes. Images  permanently stored in EMR. Interpretation: No contrast injected. I personally interpreted the imaging intraoperatively. Adequate needle placement confirmed in multiple planes. Permanent images saved into the  patient's record.  Antibiotic Prophylaxis:   Anti-infectives (From admission, onward)   None     Indication(s): None identified  Post-operative Assessment:  Post-procedure Vital Signs:  Pulse/HCG Rate: 9177 Temp: (!) 97.5 F (36.4 C) Resp: 17 BP: 122/68 SpO2: 100 %  EBL: None  Complications: No immediate post-treatment complications observed by team, or reported by patient.  Note: The patient tolerated the entire procedure well. A repeat set of vitals were taken after the procedure and the patient was kept under observation following institutional policy, for this type of procedure. Post-procedural neurological assessment was performed, showing return to baseline, prior to discharge. The patient was provided with post-procedure discharge instructions, including a section on how to identify potential problems. Should any problems arise concerning this procedure, the patient was given instructions to immediately contact us, at any time, without hesitation. In any case, we plan to contact the patient by telephone for a follow-up status report regarding this interventional procedure.  Comments:  No additional relevant information. 5 out of 5 strength bilateral lower extremity: Plantar flexion, dorsiflexion, knee flexion, knee extension.  Plan of Care  Orders:  Orders Placed This Encounter  Procedures   Radiofrequency,Lumbar    Standing Status:   Future    Standing Expiration Date:   12/15/2020    Scheduling Instructions:     Side(s): RIGHT     Level: L3-4, L4-5, & L5-S1 Facets (L3, L4, L5, & S1 Medial Branch Nerves)     Sedation: With Sedation     Scheduling Timeframe: As soon as pre-approved    Order Specific Question:   Where will this procedure be performed?    Answer:    [facility]   DG PAIN [facility] C-ARM 1-60 MIN NO REPORT    Intraoperative interpretation by procedural physician at [place].    Standing Status:   Standing    Number of Occurrences:   1    Order Specific Question:   Reason for exam:    Answer:   Assistance in needle guidance and placement for procedures requiring needle placement in or near specific anatomical locations not easily accessible without such assistance.   Medications ordered for procedure: Meds ordered this encounter  Medications   iohexol (OMNIPAQUE) 180 MG/ML injection 10 mL    Must be Myelogram-compatible. If not available, you may substitute with a water-soluble, non-ionic, hypoallergenic, myelogram-compatible radiological contrast medium.   lidocaine (XYLOCAINE) 2 % (with pres) injection 400 mg   midazolam (VERSED) 5 MG/5ML injection 1-2 mg    Make sure Flumazenil is available in the pyxis when using this medication. If oversedation occurs, administer 0.2 mg IV over 15 sec. If after 45 sec no response, administer 0.2 mg again over 1 min; may repeat at 1 min intervals; not to exceed 4 doses (1 mg)   fentaNYL (SUBLIMAZE) injection 25-50 mcg    Make sure Narcan is available in the pyxis when using this medication. In the event of respiratory depression (RR< 8/min): Titrate NARCAN (naloxone) in increments of 0.1 to 0.2 mg IV at 2-3 minute intervals, until desired degree of reversal.   ropivacaine (PF) 2 mg/mL (0.2%) (NAROPIN) injection 1 mL   dexamethasone (DECADRON) injection 10 mg   ropivacaine (PF) 2 mg/mL (0.2%) (NAROPIN) injection 2 mL   Medications administered: We administered lidocaine, midazolam, fentaNYL, ropivacaine (PF) 2 mg/mL (0.2%), dexamethasone, and ropivacaine (PF) 2 mg/mL (0.2%).  See the medical record for exact dosing, route, and time of administration.  Follow-up plan:  Return in about 2 weeks (around 06/29/2019) for Contra-lateral RFA L3,4,5 S1.      Status post left L3,  L4, L5, S1 RFA on 06/15/2019, return for right.   Recent Visits Date Type Provider Dept  06/07/19 Office Visit Gillis Santa, MD [facility]  05/09/19 Procedure visit Gillis Santa, MD [facility]  Showing recent visits within past 90 days and meeting all other requirements   Today's Visits Date Type Provider Dept  06/15/19 Procedure visit Gillis Santa, MD [facility]  Showing today's visits and meeting all other requirements   Future Appointments Date Type Provider Dept  06/29/19 Appointment Gillis Santa, MD [facility]  Showing future appointments within next 90 days and meeting all other requirements   Disposition: Discharge home  Discharge Date & Time: 06/15/2019; 0947 hrs.   Primary Care Physician: Tracie Harrier, MD Location: [facility] Note by: Gillis Santa, MD Date: 06/15/2019; Time: 10:12 AM  Disclaimer:  Medicine is not an exact science. The only guarantee in medicine is that nothing is guaranteed. It is important to note that the decision to proceed with this intervention was based on the information collected from the patient. The Data and conclusions were drawn from the patient's questionnaire, the interview, and the physical examination. Because the information was provided in large part by the patient, it cannot be guaranteed that it has not been purposely or unconsciously manipulated. Every effort has been made to obtain as much relevant data as possible for this evaluation. It is important to note that the conclusions that lead to this procedure are derived in large part from the available data. Always take into account that the treatment will also be dependent on availability of resources and existing treatment guidelines, considered by other Pain Management Practitioners as being common knowledge and practice, at the time of the intervention. For Medico-Legal purposes, it is also important to point  out that variation in procedural techniques and pharmacological choices are the acceptable norm. The indications, contraindications, technique, and results of the above procedure should only be interpreted and judged by a Board-Certified Interventional Pain Specialist with extensive familiarity and expertise in the same exact procedure and technique.

## 2019-06-15 NOTE — Telephone Encounter (Signed)
She called and states that Dr. Holley Raring spoke with her about a patch he could call out for her. She wants to know if he will go ahead and do that so [facility] can give her a price on it.

## 2019-06-15 NOTE — Telephone Encounter (Signed)
The pharmacy was not able to give her a price on the [employer] without having a prescription to "run".

## 2019-06-15 NOTE — Progress Notes (Signed)
Safety precautions to be maintained throughout the outpatient stay will include: orient to surroundings, keep bed in low position, maintain call bell within reach at all times, provide assistance with transfer out of bed and ambulation.  

## 2019-06-16 ENCOUNTER — Telehealth: Payer: Self-pay | Admitting: *Deleted

## 2019-06-16 NOTE — Telephone Encounter (Signed)
No problems post procedure. 

## 2019-06-17 ENCOUNTER — Other Ambulatory Visit: Payer: Medicare Other

## 2019-06-29 ENCOUNTER — Ambulatory Visit (HOSPITAL_BASED_OUTPATIENT_CLINIC_OR_DEPARTMENT_OTHER): Payer: Medicare Other | Admitting: Student in an Organized Health Care Education/Training Program

## 2019-06-29 ENCOUNTER — Other Ambulatory Visit: Payer: Self-pay

## 2019-06-29 ENCOUNTER — Ambulatory Visit
Admission: RE | Admit: 2019-06-29 | Discharge: 2019-06-29 | Disposition: A | Discharge status: Home / Self Care | Payer: Medicare Other | Source: Ambulatory Visit | Attending: Student in an Organized Health Care Education/Training Program | Admitting: Student in an Organized Health Care Education/Training Program

## 2019-06-29 ENCOUNTER — Encounter: Payer: Self-pay | Admitting: Student in an Organized Health Care Education/Training Program

## 2019-06-29 VITALS — BP 146/81 | HR 94 | Temp 97.4°F | Resp 16 | Ht 66.0 in | Wt 135.0 lb

## 2019-06-29 DIAGNOSIS — M47816 Spondylosis without myelopathy or radiculopathy, lumbar region: Secondary | ICD-10-CM

## 2019-06-29 MED ORDER — DEXAMETHASONE SODIUM PHOSPHATE 10 MG/ML IJ SOLN
10.0000 mg | Freq: Once | INTRAMUSCULAR | Status: AC
  Administered 2019-06-29: 10 mg

## 2019-06-29 MED ORDER — DEXAMETHASONE SODIUM PHOSPHATE 10 MG/ML IJ SOLN
10.0000 mg | Freq: Once | INTRAMUSCULAR | Status: AC
  Administered 2019-06-29: 08:00:00 10 mg
  Filled 2019-06-29: qty 1

## 2019-06-29 MED ORDER — FENTANYL CITRATE (PF) 100 MCG/2ML IJ SOLN
INTRAMUSCULAR | Status: AC
  Filled 2019-06-29: qty 2

## 2019-06-29 MED ORDER — FENTANYL CITRATE (PF) 100 MCG/2ML IJ SOLN
25.0000 ug | INTRAMUSCULAR | Status: DC | PRN
  Administered 2019-06-29: 75 ug via INTRAVENOUS

## 2019-06-29 MED ORDER — ROPIVACAINE HCL 2 MG/ML IJ SOLN
INTRAMUSCULAR | Status: AC
  Filled 2019-06-29: qty 10

## 2019-06-29 MED ORDER — LIDOCAINE HCL 2 % IJ SOLN
INTRAMUSCULAR | Status: AC
  Filled 2019-06-29: qty 20

## 2019-06-29 MED ORDER — ROPIVACAINE HCL 2 MG/ML IJ SOLN
1.0000 mL | Freq: Once | INTRAMUSCULAR | Status: DC
  Filled 2019-06-29: qty 10

## 2019-06-29 MED ORDER — LIDOCAINE HCL 2 % IJ SOLN
20.0000 mL | Freq: Once | INTRAMUSCULAR | Status: AC
  Administered 2019-06-29: 400 mg

## 2019-06-29 MED ORDER — DEXAMETHASONE SODIUM PHOSPHATE 10 MG/ML IJ SOLN
INTRAMUSCULAR | Status: AC
  Filled 2019-06-29: qty 1

## 2019-06-29 NOTE — Progress Notes (Signed)
Patient's Name: Teresa [name]  MRN: [id number]  Referring Provider: Tracie Harrier, MD  DOB: 1956-02-21  PCP: Tracie Harrier, MD  DOS: 06/29/2019  Note by: Gillis Santa, MD  Service setting: Ambulatory outpatient  Specialty: Interventional Pain Management  Patient type: Established  Location: [facility] (AMB) Pain Management Facility  Visit type: Interventional Procedure   Primary Reason for Visit: Interventional Pain Management Treatment. CC: Back Pain (low)  Procedure:          Anesthesia, Analgesia, Anxiolysis:  Type: Thermal Lumbar Facet, Medial Branch Radiofrequency Ablation/Neurotomy  #1  Primary Purpose: Therapeutic Region: Posterolateral Lumbosacral Spine Level:  L3, L4, L5, & S1 Medial Branch Level(s). These levels will denervate the L3-4, L4-5, and the L5-S1 lumbar facet joints. Laterality: Right  Type: Moderate (Conscious) Sedation combined with Local Anesthesia Indication(s): Analgesia and Anxiety Route: Intravenous (IV) IV Access: Secured Sedation: Meaningful verbal contact was maintained at all times during the procedure  Local Anesthetic: Lidocaine 1-2%  Position: Prone   Indications: 1. Lumbar spondylosis    Teresa [name] has been dealing with the above chronic pain for longer than three months and has either failed to respond, was unable to tolerate, or simply did not get enough benefit from other more conservative therapies including, but not limited to: 1. Over-the-counter medications 2. Anti-inflammatory medications 3. Muscle relaxants 4. Membrane stabilizers 5. Opioids 6. Physical therapy and/or chiropractic manipulation 7. Modalities (Heat, ice, etc.) 8. Invasive techniques such as nerve blocks. Teresa [name] has attained more than 50% relief of the pain from a series of diagnostic injections conducted in separate occasions.  Pain Score: Pre-procedure: 3 /10 Post-procedure: 2 /10  Pre-op Assessment:  Teresa [name] is a 63 y.o. (year old), female patient, seen  today for interventional treatment. She  has a past surgical history that includes Abdominal hysterectomy; Colonoscopy (2000); Eye surgery (Left, 2014); and Artery Biopsy (Left, 12/18/2015). Teresa [name] has a current medication list which includes the following prescription(s): alendronate, buprenorphine, calcium, cholecalciferol, diclofenac, ezetimibe, ferrous gluconate, flector, gabapentin, magnesium oxide, multivitamin, nortriptyline, topiramate, trazodone, zolpidem, and propranolol, and the following Facility-Administered Medications: fentanyl and ropivacaine (pf) 2 mg/ml (0.2%). Her primarily concern today is the Back Pain (low)  Initial Vital Signs:  Pulse/HCG Rate: 94ECG Heart Rate: 81 Temp: 98.2 F (36.8 C) Resp: 18 BP: 128/73 SpO2: 100 %  BMI: Estimated body mass index is 21.79 kg/m as calculated from the following:   Height as of this encounter: 5\' 6"  (1.676 m).   Weight as of this encounter: 135 lb (61.2 kg).  Risk Assessment: Allergies: Reviewed. She is allergic to codeine and morphine.  Allergy Precautions: None required Coagulopathies: Reviewed. None identified.  Blood-thinner therapy: None at this time Active Infection(s): Reviewed. None identified. Teresa [name] is afebrile  Site Confirmation: Teresa [name] was asked to confirm the procedure and laterality before marking the site Procedure checklist: Completed Consent: Before the procedure and under the influence of no sedative(s), amnesic(s), or anxiolytics, the patient was informed of the treatment options, risks and possible complications. To fulfill our ethical and legal obligations, as recommended by the American Medical Association's Code of Ethics, I have informed the patient of my clinical impression; the nature and purpose of the treatment or procedure; the risks, benefits, and possible complications of the intervention; the alternatives, including doing nothing; the risk(s) and benefit(s) of the alternative treatment(s) or  procedure(s); and the risk(s) and benefit(s) of doing nothing. The patient was provided information about the general risks and possible complications associated with the  procedure. These may include, but are not limited to: failure to achieve desired goals, infection, bleeding, organ or nerve damage, allergic reactions, paralysis, and death. In addition, the patient was informed of those risks and complications associated to Spine-related procedures, such as failure to decrease pain; infection (i.e.: Meningitis, epidural or intraspinal abscess); bleeding (i.e.: epidural hematoma, subarachnoid hemorrhage, or any other type of intraspinal or peri-dural bleeding); organ or nerve damage (i.e.: Any type of peripheral nerve, nerve root, or spinal cord injury) with subsequent damage to sensory, motor, and/or autonomic systems, resulting in permanent pain, numbness, and/or weakness of one or several areas of the body; allergic reactions; (i.e.: anaphylactic reaction); and/or death. Furthermore, the patient was informed of those risks and complications associated with the medications. These include, but are not limited to: allergic reactions (i.e.: anaphylactic or anaphylactoid reaction(s)); adrenal axis suppression; blood sugar elevation that in diabetics may result in ketoacidosis or comma; water retention that in patients with history of congestive heart failure may result in shortness of breath, pulmonary edema, and decompensation with resultant heart failure; weight gain; swelling or edema; medication-induced neural toxicity; particulate matter embolism and blood vessel occlusion with resultant organ, and/or nervous system infarction; and/or aseptic necrosis of one or more joints. Finally, the patient was informed that Medicine is not an exact science; therefore, there is also the possibility of unforeseen or unpredictable risks and/or possible complications that may result in a catastrophic outcome. The patient  indicated having understood very clearly. We have given the patient no guarantees and we have made no promises. Enough time was given to the patient to ask questions, all of which were answered to the patient's satisfaction. Teresa [name] has indicated that she wanted to continue with the procedure. Attestation: I, the ordering provider, attest that I have discussed with the patient the benefits, risks, side-effects, alternatives, likelihood of achieving goals, and potential problems during recovery for the procedure that I have provided informed consent. Date   Time: 06/29/2019  7:58 AM  Pre-Procedure Preparation:  Monitoring: As per clinic protocol. Respiration, ETCO2, SpO2, BP, heart rate and rhythm monitor placed and checked for adequate function Safety Precautions: Patient was assessed for positional comfort and pressure points before starting the procedure. Time-out: I initiated and conducted the "Time-out" before starting the procedure, as per protocol. The patient was asked to participate by confirming the accuracy of the "Time Out" information. Verification of the correct person, site, and procedure were performed and confirmed by me, the nursing staff, and the patient. "Time-out" conducted as per Joint Commission's Universal Protocol (UP.01.01.01). Time: 0825  Description of Procedure:          Laterality: Right Levels:   L3, L4, L5, & S1 Medial Branch Level(s), at the L3-4, L4-5, and the L5-S1 lumbar facet joints. Area Prepped: Lumbosacral Prepping solution: DuraPrep (Iodine Povacrylex [0.7% available iodine] and Isopropyl Alcohol, 74% w/w) Safety Precautions: Aspiration looking for blood return was conducted prior to all injections. At no point did we inject any substances, as a needle was being advanced. Before injecting, the patient was told to immediately notify me if she was experiencing any new onset of "ringing in the ears, or metallic taste in the mouth". No attempts were made at  seeking any paresthesias. Safe injection practices and needle disposal techniques used. Medications properly checked for expiration dates. SDV (single dose vial) medications used. After the completion of the procedure, all disposable equipment used was discarded in the proper designated medical waste containers. Local Anesthesia: Protocol guidelines  were followed. The patient was positioned over the fluoroscopy table. The area was prepped in the usual manner. The time-out was completed. The target area was identified using fluoroscopy. A 12-in long, straight, sterile hemostat was used with fluoroscopic guidance to locate the targets for each level blocked. Once located, the skin was marked with an approved surgical skin marker. Once all sites were marked, the skin (epidermis, dermis, and hypodermis), as well as deeper tissues (fat, connective tissue and muscle) were infiltrated with a small amount of a short-acting local anesthetic, loaded on a 10cc syringe with a 25G, 1.5-in  Needle. An appropriate amount of time was allowed for local anesthetics to take effect before proceeding to the next step. Local Anesthetic: Lidocaine 2.0% The unused portion of the local anesthetic was discarded in the proper designated containers. Technical explanation of process:  Radiofrequency Ablation (RFA)  L3 Medial Branch Nerve RFA: The target area for the L3 medial branch is at the junction of the postero-lateral aspect of the superior articular process and the superior, posterior, and medial edge of the transverse process of L4. Under fluoroscopic guidance, a Radiofrequency needle was inserted until contact was made with os over the superior postero-lateral aspect of the pedicular shadow (target area). Sensory and motor testing was conducted to properly adjust the position of the needle. Once satisfactory placement of the needle was achieved, the numbing solution was slowly injected after negative aspiration for blood. 71mL of  the nerve block solution was injected without difficulty or complication. After waiting for at least 3 minutes, the ablation was performed. Once completed, the needle was removed intact. L4 Medial Branch Nerve RFA: The target area for the L4 medial branch is at the junction of the postero-lateral aspect of the superior articular process and the superior, posterior, and medial edge of the transverse process of L5. Under fluoroscopic guidance, a Radiofrequency needle was inserted until contact was made with os over the superior postero-lateral aspect of the pedicular shadow (target area). Sensory and motor testing was conducted to properly adjust the position of the needle. Once satisfactory placement of the needle was achieved, the numbing solution was slowly injected after negative aspiration for blood. 1 mL of the nerve block solution was injected without difficulty or complication. After waiting for at least 3 minutes, the ablation was performed. Once completed, the needle was removed intact. L5 Medial Branch Nerve RFA: The target area for the L5 medial branch is at the junction of the postero-lateral aspect of the superior articular process of S1 and the superior, posterior, and medial edge of the sacral ala. Under fluoroscopic guidance, a Radiofrequency needle was inserted until contact was made with os over the superior postero-lateral aspect of the pedicular shadow (target area). Sensory and motor testing was conducted to properly adjust the position of the needle. Once satisfactory placement of the needle was achieved, the numbing solution was slowly injected after negative aspiration for blood. 31mL of the nerve block solution was injected without difficulty or complication. After waiting for at least 3 minutes, the ablation was performed. Once completed, the needle was removed intact. S1 Medial Branch Nerve RFA: The target area for the S1 medial branch is located inferior to the junction of the S1  superior articular process and the L5 inferior articular process, posterior, inferior, and lateral to the 6 o'clock position of the L5-S1 facet joint, just superior to the S1 posterior foramen. Under fluoroscopic guidance, the Radiofrequency needle was advanced until contact was made with os over  the Target area. Sensory and motor testing was conducted to properly adjust the position of the needle. Once satisfactory placement of the needle was achieved, the numbing solution was slowly injected after negative aspiration for blood.45mL of the nerve block solution was injected without difficulty or complication. After waiting for at least 3 minutes, the ablation was performed. Once completed, the needle was removed intact.  Radiofrequency lesioning (ablation):  Radiofrequency Generator: NeuroTherm [id number] Sensory Stimulation Parameters: 50 Hz was used to locate & identify the nerve, making sure that the needle was positioned such that there was no sensory stimulation below 0.3 V or above 0.7 V. Motor Stimulation Parameters: 2 Hz was used to evaluate the motor component. Care was taken not to lesion any nerves that demonstrated motor stimulation of the lower extremities at an output of less than 2.5 times that of the sensory threshold, or a maximum of 2.0 V. Lesioning Technique Parameters: Standard Radiofrequency settings. (Not bipolar or pulsed.) Temperature Settings: 80 degrees C Lesioning time: 60 seconds Intra-operative Compliance: Compliant Materials & Medications: Needle(s) (Electrode/Cannula) Type: Teflon-coated, curved tip, Radiofrequency needle(s) Gauge: 22G Length: 10cm Numbing solution: 6 cc solution made of 4 cc of 0.2% ropivacaine, 2 cc of Decadron 10 mg/cc.  1 to 1.5 cc injected at each level above on the left after sensorimotor testing prior to lesioning.  The unused portion of the solution was discarded in the proper designated containers.  Once the entire procedure was completed, the  treated area was cleaned, making sure to leave some of the prepping solution back to take advantage of its long term bactericidal properties.  Illustration of the posterior view of the lumbar spine and the posterior neural structures. Laminae of L2 through S1 are labeled. DPRL5, dorsal primary ramus of L5; DPRS1, dorsal primary ramus of S1; DPR3, dorsal primary ramus of L3; FJ, facet (zygapophyseal) joint L3-L4; I, inferior articular process of L4; LB1, lateral branch of dorsal primary ramus of L1; IAB, inferior articular branches from L3 medial branch (supplies L4-L5 facet joint); IBP, intermediate branch plexus; MB3, medial branch of dorsal primary ramus of L3; NR3, third lumbar nerve root; S, superior articular process of L5; SAB, superior articular branches from L4 (supplies L4-5 facet joint also); TP3, transverse process of L3.  Vitals:   06/29/19 0909 06/29/19 0916 06/29/19 0926 06/29/19 0933  BP: 128/79 130/80 (!) 143/82 (!) 146/81  Pulse:      Resp: 13 12 13 16   Temp:   (!) 97.4 F (36.3 C)   SpO2: 98% 100% 99% 99%  Weight:      Height:        Start Time: 0825 hrs. End Time: 0908 hrs.  Imaging Guidance (Spinal):          Type of Imaging Technique: Fluoroscopy Guidance (Spinal) Indication(s): Assistance in needle guidance and placement for procedures requiring needle placement in or near specific anatomical locations not easily accessible without such assistance. Exposure Time: Please see nurses notes. Contrast: None used. Fluoroscopic Guidance: I was personally present during the use of fluoroscopy. "Tunnel Vision Technique" used to obtain the best possible view of the target area. Parallax error corrected before commencing the procedure. "Direction-depth-direction" technique used to introduce the needle under continuous pulsed fluoroscopy. Once target was reached, antero-posterior, oblique, and lateral fluoroscopic projection used confirm needle placement in all planes. Images  permanently stored in EMR. Interpretation: No contrast injected. I personally interpreted the imaging intraoperatively. Adequate needle placement confirmed in multiple planes. Permanent images saved into the patient's  record.  Antibiotic Prophylaxis:   Anti-infectives (From admission, onward)   None     Indication(s): None identified  Post-operative Assessment:  Post-procedure Vital Signs:  Pulse/HCG Rate: 9489 Temp: (!) 97.4 F (36.3 C) Resp: 16 BP: (!) 146/81 SpO2: 99 %  EBL: None  Complications: No immediate post-treatment complications observed by team, or reported by patient.  Note: The patient tolerated the entire procedure well. A repeat set of vitals were taken after the procedure and the patient was kept under observation following institutional policy, for this type of procedure. Post-procedural neurological assessment was performed, showing return to baseline, prior to discharge. The patient was provided with post-procedure discharge instructions, including a section on how to identify potential problems. Should any problems arise concerning this procedure, the patient was given instructions to immediately contact us, at any time, without hesitation. In any case, we plan to contact the patient by telephone for a follow-up status report regarding this interventional procedure.  Comments:  No additional relevant information. 5 out of 5 strength bilateral lower extremity: Plantar flexion, dorsiflexion, knee flexion, knee extension.  Plan of Care  Orders:  Orders Placed This Encounter  Procedures   DG PAIN CL[facility] C-ARM 1-60 MIN NO REPORT    Intraoperative interpretation by procedural physician at [place].    Standing Status:   Standing    Number of Occurrences:   1    Order Specific Question:   Reason for exam:    Answer:   Assistance in needle guidance and placement for procedures requiring needle placement in or near specific anatomical locations not  easily accessible without such assistance.   Medications ordered for procedure: Meds ordered this encounter  Medications   lidocaine (XYLOCAINE) 2 % (with pres) injection 400 mg   fentaNYL (SUBLIMAZE) injection 25-50 mcg    Make sure Narcan is available in the pyxis when using this medication. In the event of respiratory depression (RR< 8/min): Titrate NARCAN (naloxone) in increments of 0.1 to 0.2 mg IV at 2-3 minute intervals, until desired degree of reversal.   ropivacaine (PF) 2 mg/mL (0.2%) (NAROPIN) injection 1 mL   dexamethasone (DECADRON) injection 10 mg   dexamethasone (DECADRON) injection 10 mg   Medications administered: We administered lidocaine, fentaNYL, dexamethasone, and dexamethasone.  See the medical record for exact dosing, route, and time of administration.  Follow-up plan:   Return in about 6 weeks (around 08/10/2019) for Post Procedure Evaluation, virtual.      Status post left L3, L4, L5, S1 RFA on 06/15/2019, R L3,4,5, S1 RFA on 06/29/2019,  Recent Visits Date Type Provider Dept  06/15/19 Procedure visit Gillis Santa, MD [facility]  06/07/19 Office Visit Gillis Santa, MD [facility]  05/09/19 Procedure visit Gillis Santa, MD [facility]  Showing recent visits within past 90 days and meeting all other requirements   Today's Visits Date Type Provider Dept  06/29/19 Procedure visit Gillis Santa, MD [facility]  Showing today's visits and meeting all other requirements   Future Appointments Date Type Provider Dept  08/10/19 Appointment Gillis Santa, MD [facility]  Showing future appointments within next 90 days and meeting all other requirements   Disposition: Discharge home  Discharge Date & Time: 06/29/2019; 0934 hrs.   Primary Care Physician: Tracie Harrier, MD Location: [facility] Out[facility] Note by: Gillis Santa, MD Date: 06/29/2019; Time: 9:46 AM  Disclaimer:  Medicine  is not an exact science. The only guarantee in medicine is  that nothing is guaranteed. It is important to note that the decision to proceed with this intervention was based on the information collected from the patient. The Data and conclusions were drawn from the patient's questionnaire, the interview, and the physical examination. Because the information was provided in large part by the patient, it cannot be guaranteed that it has not been purposely or unconsciously manipulated. Every effort has been made to obtain as much relevant data as possible for this evaluation. It is important to note that the conclusions that lead to this procedure are derived in large part from the available data. Always take into account that the treatment will also be dependent on availability of resources and existing treatment guidelines, considered by other Pain Management Practitioners as being common knowledge and practice, at the time of the intervention. For Medico-Legal purposes, it is also important to point out that variation in procedural techniques and pharmacological choices are the acceptable norm. The indications, contraindications, technique, and results of the above procedure should only be interpreted and judged by a Board-Certified Interventional Pain Specialist with extensive familiarity and expertise in the same exact procedure and technique.

## 2019-06-29 NOTE — Progress Notes (Signed)
Safety precautions to be maintained throughout the outpatient stay will include: orient to surroundings, keep bed in low position, maintain call bell within reach at all times, provide assistance with transfer out of bed and ambulation.  

## 2019-06-29 NOTE — Patient Instructions (Signed)

## 2019-06-30 ENCOUNTER — Telehealth: Payer: Self-pay

## 2019-06-30 MED ORDER — OXYCODONE-ACETAMINOPHEN 5-325 MG PO TABS: 1.0000 | ORAL_TABLET | Freq: Three times a day (TID) | ORAL | 0 refills | Status: AC | PRN

## 2019-06-30 NOTE — Telephone Encounter (Signed)
Post procedure phone call.  Patient states she had horrible deep throbbing pain which kept her up all night.  She was crying when speaking to me. States she took a 65 year old Percocet 5/325 mg because she also had a terrible migraine.  Stattes within 20 minutes it took her back pain away.  States she only has a couple of these left and wanted to know if you would prescribe them for her.  States you prescribe Hydrocodone for her but she does not take them because they cause her to have migraines.

## 2019-06-30 NOTE — Telephone Encounter (Signed)
Ok for short supply for Percocet for acute post-procedural pain s/p RFA. PMP checked and appropriate.  Requested Prescriptions   Signed Prescriptions Disp Refills  . oxyCODONE-acetaminophen (PERCOCET) 5-325 MG tablet 20 tablet 0    Sig: Take 1 tablet by mouth every 8 (eight) hours as needed for up to 7 days for severe pain.    Authorizing Provider: Gillis Santa

## 2019-06-30 NOTE — Telephone Encounter (Signed)
Patient notified

## 2019-07-19 ENCOUNTER — Telehealth: Payer: Self-pay | Admitting: *Deleted

## 2019-08-09 ENCOUNTER — Encounter: Payer: Self-pay | Admitting: Student in an Organized Health Care Education/Training Program

## 2019-08-10 ENCOUNTER — Encounter: Payer: Self-pay | Admitting: Student in an Organized Health Care Education/Training Program

## 2019-08-10 ENCOUNTER — Other Ambulatory Visit: Payer: Self-pay

## 2019-08-10 ENCOUNTER — Ambulatory Visit
Payer: Medicare Other | Attending: Student in an Organized Health Care Education/Training Program | Admitting: Student in an Organized Health Care Education/Training Program

## 2019-08-10 DIAGNOSIS — M5137 Other intervertebral disc degeneration, lumbosacral region: Secondary | ICD-10-CM | POA: Diagnosis not present

## 2019-08-10 DIAGNOSIS — G894 Chronic pain syndrome: Secondary | ICD-10-CM | POA: Diagnosis not present

## 2019-08-10 DIAGNOSIS — M5416 Radiculopathy, lumbar region: Secondary | ICD-10-CM | POA: Diagnosis not present

## 2019-08-10 DIAGNOSIS — M47816 Spondylosis without myelopathy or radiculopathy, lumbar region: Secondary | ICD-10-CM

## 2019-08-10 MED ORDER — OXYCODONE-ACETAMINOPHEN 5-325 MG PO TABS: 1.0000 | ORAL_TABLET | Freq: Two times a day (BID) | ORAL | 0 refills | Status: AC | PRN

## 2019-08-10 MED ORDER — GABAPENTIN 300 MG PO CAPS: 300.0000 mg | ORAL_CAPSULE | Freq: Three times a day (TID) | ORAL | 1 refills | Status: DC

## 2019-08-10 NOTE — Progress Notes (Signed)
Pain Management Virtual Encounter Note - Virtual Visit via [place] (real-time audio visits between healthcare provider and patient).   Patient's Phone No. & Preferred Pharmacy:  [phone] (home); [phone] (mobile); (Preferred) [phone] [email or web address]  [id number] [place],  - [address] [place] [address] Phone: [phone] Fax: [phone]    Pre-screening note:  Our staff contacted Ms. Decoux and offered her an "in person", "face-to-face" appointment versus a telephone encounter. She indicated preferring the telephone encounter, at this time.   Reason for Virtual Visit: COVID-19*  Social distancing based on CDC and AMA recommendations.   I contacted Eloy End on 08/10/2019 via video conference.      I clearly identified myself as Gillis Santa, MD. I verified that I was speaking with the correct person using two identifiers (Name: NATIYAH HAAS, and date of birth: Jun 28, 1956).  Advanced Informed Consent I sought verbal advanced consent from Eloy End for virtual visit interactions. I informed Ms. Narciso of possible security and privacy concerns, risks, and limitations associated with providing "not-in-person" medical evaluation and management services. I also informed Ms. Geno of the availability of "in-person" appointments. Finally, I informed her that there would be a charge for the virtual visit and that she could be  personally, fully or partially, financially responsible for it. Ms. Caison expressed understanding and agreed to proceed.   Historic Elements   Ms. BAWI BISHARA is a 63 y.o. year old, female patient evaluated today after her last encounter by our practice on 07/19/2019. Ms. Chiodi  has a past medical history of Headache, History of fractured vertebra, Hyperlipidemia, Low bone density, and Transfusion of blood product refused for religious reason. She also  has a past surgical  history that includes Abdominal hysterectomy; Colonoscopy (2000); Eye surgery (Left, 2014); and Artery Biopsy (Left, 12/18/2015). Ms. Wissink has a current medication list which includes the following prescription(s): alendronate, calcium, cholecalciferol, ezetimibe, ferrous gluconate, gabapentin, multivitamin, nortriptyline, topiramate, trazodone, zolpidem, diclofenac, flector, magnesium oxide, oxycodone-acetaminophen, and propranolol. She  reports that she has never smoked. She has never used smokeless tobacco. She reports that she does not drink alcohol or use drugs. Ms. Divin is allergic to codeine and morphine.   HPI  Today, she is being contacted for both, medication management and a post-procedure assessment.   Patient is status post left L3, L4, L5, S1 lumbar facet ablation as well as right L3, L4, L5, S1 lumbar facet ablation on 06/15/2019 and 06/29/2019 respectively.  Unfortunately these ablations have not provided her with significant pain relief in regards to her axial low back pain.  She continues to endorse painin her spine and states that it is very debilitating.  She describes it as burning.  She states that it is painful to touch.  At this point we have done a right L4-L5 epidural steroid injection as well as diagnostic and therapeutic lumbar facet blocks and radiofrequency ablation respectively.  We did discuss referral to neurosurgery but the patient declines.  She is not interested in surgery at this time.  We also discussed thoracolumbar spinal cord stimulation.  This can be effective for chronic radicular pain but generally is not very effective for axial low back pain which the patient is primarily describing.  Can consider in future.  Patient states that she has been utilizing her oxycodone very sparingly.  She is scared about getting addicted.  I had an extensive discussion with the patient regarding the goals of  pain management.  I instructed her that when these medications are used  judiciously as a part of a multimodal pain regimen program, they can be effective in helping to manage pain and improve functional status.  Patient is on a low dose.  I encouraged her to try and utilize oxycodone twice daily as needed for pain.  We also discussed increasing gabapentin to 300 mg 3 times a day.  Prescription as below.  Pharmacotherapy Assessment  Analgesic: Percocet 5 mg twice daily PRN, quantity 60/month   Monitoring: Pharmacotherapy: No side-effects or adverse reactions reported. [place] PMP: PDMP reviewed during this encounter.       Compliance: No problems identified. Effectiveness: Clinically acceptable. Plan: Refer to "POC".  UDS:  Summary  Date Value Ref Range Status  12/27/2018 FINAL  Final    Comment:    ==================================================================== TOXASSURE COMP DRUG ANALYSIS,UR ==================================================================== Test                             Result       Flag       Units Drug Present and Declared for Prescription Verification   Gabapentin                     PRESENT      EXPECTED   Topiramate                     PRESENT      EXPECTED   Zolpidem                       PRESENT      EXPECTED   Zolpidem Acid                  PRESENT      EXPECTED    Zolpidem acid is an expected metabolite of zolpidem.   Nortriptyline                  PRESENT      EXPECTED    Nortriptyline may be administered as a prescription drug; it is    also an expected metabolite of amitriptyline.   Trazodone                      PRESENT      EXPECTED   1,3 chlorophenyl piperazine    PRESENT      EXPECTED    1,3-chlorophenyl piperazine is an expected metabolite of    trazodone.   Acetaminophen                  PRESENT      EXPECTED Drug Present not Declared for Prescription Verification   Tramadol                       1895         UNEXPECTED ng/mg creat   O-Desmethyltramadol            2338         UNEXPECTED ng/mg creat    N-Desmethyltramadol            875          UNEXPECTED ng/mg creat    Source of tramadol is a prescription medication.    O-desmethyltramadol and N-desmethyltramadol are expected    metabolites of tramadol.   Lidocaine  PRESENT      UNEXPECTED Drug Absent but Declared for Prescription Verification   Hydrocodone                    Not Detected UNEXPECTED ng/mg creat   Diclofenac                     Not Detected UNEXPECTED    Diclofenac, as indicated in the declared medication list, is not    always detected even when used as directed.   Propranolol                    Not Detected UNEXPECTED ==================================================================== Test                      Result    Flag   Units      Ref Range   Creatinine              136              mg/dL      >=20 ==================================================================== Declared Medications:  The flagging and interpretation on this report are based on the  following declared medications.  Unexpected results may arise from  inaccuracies in the declared medications.  **Note: The testing scope of this panel includes these medications:  Gabapentin  Hydrocodone (Hydrocodone-Acetaminophen)  Nortriptyline  Propranolol  Topiramate  Trazodone  **Note: The testing scope of this panel does not include small to  moderate amounts of these reported medications:  Acetaminophen (Hydrocodone-Acetaminophen)  Diclofenac  Zolpidem  **Note: The testing scope of this panel does not include following  reported medications:  Alendronate  Calcium  Cholecalciferol  Ezetimibe  Ferrous Gluconate  Magnesium  Multivitamin ==================================================================== For clinical consultation, please call [phone]. ====================================================================    Laboratory Chemistry Profile (12 mo)  Renal: No results found for requested labs within last  8760 hours.  Lab Results  Component Value Date   GFRAA >90 08/14/2014   GFRNONAA 89 (L) 08/14/2014   Hepatic: No results found for requested labs within last 8760 hours. No results found for: AST, ALT Other: No results found for requested labs within last 8760 hours. Note: Above Lab results reviewed.   Assessment  The primary encounter diagnosis was Lumbar spondylosis. Diagnoses of Lumbar facet arthropathy (L3/4 and L4/5), Degeneration of lumbar or lumbosacral intervertebral disc, Lumbar radiculopathy (right L5), and Chronic pain syndrome were also pertinent to this visit.  Plan of Care  I have changed Jurnie W. Mccort's gabapentin. I am also having her start on oxyCODONE-acetaminophen. Additionally, I am having her maintain her zolpidem, topiramate, CALCIUM PO, Cholecalciferol (VITAMIN D-3 PO), magnesium oxide, propranolol, ferrous gluconate, Flector, nortriptyline, traZODone, alendronate, EZETIMIBE PO, multivitamin, and diclofenac.  Pharmacotherapy (Medications Ordered): Meds ordered this encounter  Medications  . oxyCODONE-acetaminophen (PERCOCET) 5-325 MG tablet    Sig: Take 1 tablet by mouth every 12 (twelve) hours as needed for severe pain. Must last 30 days.    Dispense:  60 tablet    Refill:  0    Chronic Pain. (STOP Act - Not applicable). Fill one day early if closed on scheduled refill date.  . gabapentin (NEURONTIN) 300 MG capsule    Sig: Take 1 capsule (300 mg total) by mouth 3 (three) times daily.    Dispense:  90 capsule    Refill:  1   Follow-up plan:   Return in about 6 weeks (around 09/21/2019) for Medication Management, in person.  Status post left L3, L4, L5, S1 RFA on 06/15/2019, R L3,4,5, S1 RFA on 06/29/2019,   Recent Visits Date Type Provider Dept  06/29/19 Procedure visit Gillis Santa, MD [facility]  06/15/19 Procedure visit Gillis Santa, MD [facility]  06/07/19 Office Visit Gillis Santa, MD [facility]  Showing recent  visits within past 90 days and meeting all other requirements   Today's Visits Date Type Provider Dept  08/10/19 Office Visit Gillis Santa, MD [facility]  Showing today's visits and meeting all other requirements   Future Appointments No visits were found meeting these conditions.  Showing future appointments within next 90 days and meeting all other requirements   I discussed the assessment and treatment plan with the patient. The patient was provided an opportunity to ask questions and all were answered. The patient agreed with the plan and demonstrated an understanding of the instructions.  Patient advised to call back or seek an in-person evaluation if the symptoms or condition worsens.  Total duration of non-face-to-face encounter: 34minutes.  Note by: Gillis Santa, MD Date: 08/10/2019; Time: 2:01 PM  Note: This dictation was prepared with Dragon dictation. Any transcriptional errors that may result from this process are unintentional.  Disclaimer:  * Given the special circumstances of the COVID-19 pandemic, the federal government has announced that the Office for Civil Rights (OCR) will exercise its enforcement discretion and will not impose penalties on physicians using telehealth in the event of noncompliance with regulatory requirements under the [place] and [place] (HIPAA) in connection with the good faith provision of telehealth during the XX123456 national public health emergency. ([place])

## 2019-08-31 ENCOUNTER — Inpatient Hospital Stay: Admission: RE | Admit: 2019-08-31 | Payer: Medicare Other | Source: Ambulatory Visit

## 2019-09-02 ENCOUNTER — Other Ambulatory Visit: Admission: RE | Admit: 2019-09-02 | Payer: Medicare Other | Source: Ambulatory Visit

## 2019-09-07 ENCOUNTER — Ambulatory Visit: Admit: 2019-09-07 | Payer: Medicare Other | Admitting: Surgery

## 2019-09-07 SURGERY — MINOR EXCISION OF MASS
Anesthesia: Choice | Laterality: Right

## 2019-09-20 ENCOUNTER — Encounter: Payer: Medicare Other | Admitting: Student in an Organized Health Care Education/Training Program

## 2019-10-17 ENCOUNTER — Telehealth: Payer: Self-pay | Admitting: *Deleted

## 2019-10-17 ENCOUNTER — Encounter: Payer: Self-pay | Admitting: Student in an Organized Health Care Education/Training Program

## 2019-10-17 ENCOUNTER — Other Ambulatory Visit: Payer: Self-pay

## 2019-10-17 ENCOUNTER — Ambulatory Visit
Payer: Medicare Other | Attending: Student in an Organized Health Care Education/Training Program | Admitting: Student in an Organized Health Care Education/Training Program

## 2019-10-17 DIAGNOSIS — G894 Chronic pain syndrome: Secondary | ICD-10-CM | POA: Diagnosis not present

## 2019-10-17 DIAGNOSIS — M47816 Spondylosis without myelopathy or radiculopathy, lumbar region: Secondary | ICD-10-CM

## 2019-10-17 DIAGNOSIS — M5416 Radiculopathy, lumbar region: Secondary | ICD-10-CM

## 2019-10-17 DIAGNOSIS — M5137 Other intervertebral disc degeneration, lumbosacral region: Secondary | ICD-10-CM

## 2019-10-17 MED ORDER — GABAPENTIN 300 MG PO CAPS: 300.0000 mg | ORAL_CAPSULE | Freq: Three times a day (TID) | ORAL | 2 refills | Status: DC

## 2019-10-17 MED ORDER — OXYCODONE-ACETAMINOPHEN 5-325 MG PO TABS: 1.0000 | ORAL_TABLET | Freq: Two times a day (BID) | ORAL | 0 refills | Status: AC | PRN

## 2019-10-17 NOTE — Progress Notes (Addendum)
Pain Management Virtual Encounter Note - Virtual Visit via [place] (real-time audio visits between healthcare provider and patient).   Patient's Phone No. & Preferred Pharmacy:  [phone] (home); [phone] (mobile); (Preferred) [phone] [email or web address]  [id number] [place] [place] - [address] [place] [address] Phone: [phone] Fax: [phone]    Pre-screening note:  Our staff contacted Teresa [name] and offered her an "in person", "face-to-face" appointment versus a telephone encounter. She indicated preferring the telephone encounter, at this time.   Reason for Virtual Visit: COVID-19*  Social distancing based on CDC and AMA recommendations.   I contacted Teresa [name] on 10/17/2019 via video conference.      I clearly identified myself as Gillis Santa, MD. I verified that I was speaking with the correct person using two identifiers (Name: Teresa [name], and date of birth: 09-14-1956).  Advanced Informed Consent I sought verbal advanced consent from Teresa [name] for virtual visit interactions. I informed Teresa [name] of possible security and privacy concerns, risks, and limitations associated with providing "not-in-person" medical evaluation and management services. I also informed Teresa [name] of the availability of "in-person" appointments. Finally, I informed her that there would be a charge for the virtual visit and that she could be  personally, fully or partially, financially responsible for it. Teresa [name] expressed understanding and agreed to proceed.   Historic Elements   Teresa [name] is a 63 y.o. year old, female patient evaluated today after her last encounter by our practice on 10/17/2019. Teresa [name]  has a past medical history of Headache, History of fractured vertebra, Hyperlipidemia, Low bone density, and Transfusion of blood product refused for religious reason. She also  has a past  surgical history that includes Abdominal hysterectomy; Colonoscopy (2000); Eye surgery (Left, 2014); and Artery Biopsy (Left, 12/18/2015). Teresa [name] has a current medication list which includes the following prescription(s): alendronate, calcium, cholecalciferol, ezetimibe, ferrous gluconate, flector, gabapentin, multivitamin, nortriptyline, oxycodone-acetaminophen, propranolol, topiramate, trazodone, and zolpidem. She  reports that she has never smoked. She has never used smokeless tobacco. She reports that she does not drink alcohol or use drugs. Teresa [name] is allergic to codeine and morphine.   HPI  Today, she is being contacted for medication management.   -Continues to struggle with low back pain with radiation to her right thigh and foot. Worse with sitting. Better with standing. -Status post left L3, L4, L5, S1 RFA on 06/15/2019, R L3,4,5, S1 RFA on 06/29/2019, consider repeating after February -Is utilizing Oxycodone prn, does cause constipation and headaches. Only takes it PRN but it does help when she in pain flare -Continues with Gabapentin and Topamax   Pharmacotherapy Assessment  Analgesic: 08/18/2019  1   08/10/2019  Oxycodone-Acetaminophen 5-325  60.00  30 Bi Lat   [id number]   Nor (0921)   0  15.00 MME  Private Pay   [place]   Monitoring: Pharmacotherapy: No side-effects or adverse reactions reported.  PMP: PDMP reviewed during this encounter.       Compliance: No problems identified. Effectiveness: Clinically acceptable. Plan: Refer to "POC".  UDS:  Summary  Date Value Ref Range Status  12/27/2018 FINAL  Final    Comment:    ==================================================================== TOXASSURE COMP DRUG ANALYSIS,UR ==================================================================== Test                             Result  Flag       Units Drug Present and Declared for Prescription Verification   Gabapentin                     PRESENT      EXPECTED    Topiramate                     PRESENT      EXPECTED   Zolpidem                       PRESENT      EXPECTED   Zolpidem Acid                  PRESENT      EXPECTED    Zolpidem acid is an expected metabolite of zolpidem.   Nortriptyline                  PRESENT      EXPECTED    Nortriptyline may be administered as a prescription drug; it is    also an expected metabolite of amitriptyline.   Trazodone                      PRESENT      EXPECTED   1,3 chlorophenyl piperazine    PRESENT      EXPECTED    1,3-chlorophenyl piperazine is an expected metabolite of    trazodone.   Acetaminophen                  PRESENT      EXPECTED Drug Present not Declared for Prescription Verification   Tramadol                       1895         UNEXPECTED ng/mg creat   O-Desmethyltramadol            2338         UNEXPECTED ng/mg creat   N-Desmethyltramadol            875          UNEXPECTED ng/mg creat    Source of tramadol is a prescription medication.    O-desmethyltramadol and N-desmethyltramadol are expected    metabolites of tramadol.   Lidocaine                      PRESENT      UNEXPECTED Drug Absent but Declared for Prescription Verification   Hydrocodone                    Not Detected UNEXPECTED ng/mg creat   Diclofenac                     Not Detected UNEXPECTED    Diclofenac, as indicated in the declared medication list, is not    always detected even when used as directed.   Propranolol                    Not Detected UNEXPECTED ==================================================================== Test                      Result    Flag   Units      Ref Range   Creatinine              136  mg/dL      >=20 ==================================================================== Declared Medications:  The flagging and interpretation on this report are based on the  following declared medications.  Unexpected results may arise from  inaccuracies in the declared medications.  **Note: The  testing scope of this panel includes these medications:  Gabapentin  Hydrocodone (Hydrocodone-Acetaminophen)  Nortriptyline  Propranolol  Topiramate  Trazodone  **Note: The testing scope of this panel does not include small to  moderate amounts of these reported medications:  Acetaminophen (Hydrocodone-Acetaminophen)  Diclofenac  Zolpidem  **Note: The testing scope of this panel does not include following  reported medications:  Alendronate  Calcium  Cholecalciferol  Ezetimibe  Ferrous Gluconate  Magnesium  Multivitamin ==================================================================== For clinical consultation, please call [phone]. ====================================================================    Laboratory Chemistry Profile (12 mo)  Renal: No results found for requested labs within last 8760 hours.  Lab Results  Component Value Date   GFRAA >90 08/14/2014   GFRNONAA 89 (L) 08/14/2014   Hepatic: No results found for requested labs within last 8760 hours. No results found for: AST, ALT Other: No results found for requested labs within last 8760 hours. Note: Above Lab results reviewed.   Assessment  The primary encounter diagnosis was Lumbar spondylosis. Diagnoses of Lumbar facet arthropathy (L3/4 and L4/5), Degeneration of lumbar or lumbosacral intervertebral disc, Lumbar radiculopathy (right L5), and Chronic pain syndrome were also pertinent to this visit.  Plan of Care   1.  Continue oxycodone as needed for severe breakthrough pain only.  Discussed stool softener that patient could take for constipation.  2.  Refill of gabapentin as below.  3.  Discussed headache management as well.  Patient is on Topamax 100 mg daily.  She can utilize ibuprofen 400 to 600 mg or naproxen 440 mg during headache or even consider acetaminophen at 1000 mg.  Future considerations would include Botox.  4.  At next visit, I discussed repeating lumbar radiofrequency ablation  as it will be over 6 months since patient's previous lumbar radiofrequency ablation.  5. Also recommend pt try Turmeric 1000 mg daily.  I have discontinued Arlita W. Mullings's magnesium oxide and diclofenac. I am also having her start on oxyCODONE-acetaminophen. Additionally, I am having her maintain her zolpidem, topiramate, CALCIUM PO, Cholecalciferol (VITAMIN D-3 PO), propranolol, ferrous gluconate, Flector, nortriptyline, traZODone, alendronate, EZETIMIBE PO, multivitamin, and gabapentin.  Pharmacotherapy (Medications Ordered): Meds ordered this encounter  Medications  . gabapentin (NEURONTIN) 300 MG capsule    Sig: Take 1 capsule (300 mg total) by mouth 3 (three) times daily.    Dispense:  90 capsule    Refill:  2  . oxyCODONE-acetaminophen (PERCOCET) 5-325 MG tablet    Sig: Take 1 tablet by mouth 2 (two) times daily as needed for severe pain. Must last 30 days.    Dispense:  60 tablet    Refill:  0    Chronic Pain. (STOP Act - Not applicable). Fill one day early if closed on scheduled refill date.   Orders:  No orders of the defined types were placed in this encounter.  Follow-up plan:   Return in about 10 weeks (around 12/26/2019) for Medication Management.     Status post left L3, L4, L5, S1 RFA on 06/15/2019, R L3,4,5, S1 RFA on 06/29/2019,    Recent Visits Date Type Provider Dept  08/10/19 Office Visit Gillis Santa, MD [facility]  Showing recent visits within past 90 days and meeting all other requirements   Today's Visits Date Type Provider  Dept  10/17/19 Office Visit Gillis Santa, MD [facility]  Showing today's visits and meeting all other requirements   Future Appointments No visits were found meeting these conditions.  Showing future appointments within next 90 days and meeting all other requirements   I discussed the assessment and treatment plan with the patient. The patient was provided an opportunity to ask questions and all were answered.  The patient agreed with the plan and demonstrated an understanding of the instructions.  Patient advised to call back or seek an in-person evaluation if the symptoms or condition worsens.  Total duration of non-face-to-face encounter: 25 minutes.  Note by: Gillis Santa, MD Date: 10/17/2019; Time: 10:28 AM  Note: This dictation was prepared with Dragon dictation. Any transcriptional errors that may result from this process are unintentional.  Disclaimer:  * Given the special circumstances of the COVID-19 pandemic, the federal government has announced that the Office for Civil Rights (OCR) will exercise its enforcement discretion and will not impose penalties on physicians using telehealth in the event of noncompliance with regulatory requirements under the [place] and [place] (HIPAA) in connection with the good faith provision of telehealth during the XX123456 national public health emergency. ([place])

## 2019-10-17 NOTE — Telephone Encounter (Signed)
Attempted to call for pre appointment review of allergies/meds. Message left. 

## 2019-10-18 ENCOUNTER — Encounter: Payer: Medicare Other | Admitting: Student in an Organized Health Care Education/Training Program

## 2019-10-21 ENCOUNTER — Other Ambulatory Visit: Payer: Self-pay | Admitting: Internal Medicine

## 2019-10-21 DIAGNOSIS — Z1231 Encounter for screening mammogram for malignant neoplasm of breast: Secondary | ICD-10-CM

## 2019-12-21 ENCOUNTER — Encounter: Payer: Self-pay | Admitting: Student in an Organized Health Care Education/Training Program

## 2019-12-22 ENCOUNTER — Other Ambulatory Visit: Payer: Self-pay

## 2019-12-22 ENCOUNTER — Ambulatory Visit
Payer: Medicare Other | Attending: Student in an Organized Health Care Education/Training Program | Admitting: Student in an Organized Health Care Education/Training Program

## 2019-12-22 DIAGNOSIS — M75102 Unspecified rotator cuff tear or rupture of left shoulder, not specified as traumatic: Secondary | ICD-10-CM

## 2019-12-22 DIAGNOSIS — M19012 Primary osteoarthritis, left shoulder: Secondary | ICD-10-CM | POA: Diagnosis not present

## 2019-12-22 DIAGNOSIS — G894 Chronic pain syndrome: Secondary | ICD-10-CM

## 2019-12-22 DIAGNOSIS — M12812 Other specific arthropathies, not elsewhere classified, left shoulder: Secondary | ICD-10-CM

## 2019-12-22 MED ORDER — OXYCODONE-ACETAMINOPHEN 5-325 MG PO TABS: 1.0000 | ORAL_TABLET | Freq: Two times a day (BID) | ORAL | 0 refills | Status: AC | PRN

## 2019-12-22 MED ORDER — GABAPENTIN 300 MG PO CAPS: 300.0000 mg | ORAL_CAPSULE | Freq: Three times a day (TID) | ORAL | 2 refills | Status: DC

## 2019-12-22 NOTE — Progress Notes (Signed)
Patient: Teresa [name]  Service Category: E/M  Provider: Gillis Santa, MD  DOB: 1956/10/08  DOS: 12/22/2019  Location: Office  MRN: [id number]  Setting: Ambulatory outpatient  Referring Provider: Tracie Harrier, MD  Type: Established Patient  Specialty: Interventional Pain Management  PCP: Tracie Harrier, MD  Location: Home  Delivery: TeleHealth     Virtual Encounter - Pain Management PROVIDER NOTE: Information contained herein reflects review and annotations entered in association with encounter. Interpretation of such information and data should be left to medically-trained personnel. Information provided to patient can be located elsewhere in the medical record under "Patient Instructions". Document created using STT-dictation technology, any transcriptional errors that may result from process are unintentional.    Contact & Pharmacy Preferred: [place]: [phone] (home) Mobile: [phone] (mobile) E-mail: [email or web address]  [id number] [place] [id number]: [phone]: [phone]  Pre-screening  Ms. AKuehloffered "in-person" vs "virtual" encounter. She indicated preferring virtual for this encounter.   Reason COVID-19*  Social distancing based on CDC and AMA recommendations.   I contacted Teresa [name] 12/22/2019 via telephone.      I clearly identified myself as BGillis Santa MD. I verified that I was speaking with the correct person using two identifiers (Name: Teresa [name] and date of birth: [id number].  This visit was completed via telephone due to the restrictions of the COVID-19 pandemic. All issues as above were discussed and addressed but no physical exam was performed. If it was felt that the patient should be evaluated in the office, they were directed there. The patient verbally consented to this visit. Patient was unable to complete an audio/visual visit due to  Technical difficulties and/or Lack of internet. Due to the catastrophic nature of the COVID-19 pandemic, this visit was done through audio contact only.  Location of the patient: home address (see Epic for details)  Location of the provider: office Consent I sought verbal advanced consent from Teresa Endfor virtual visit interactions. I informed Ms. AMcgoughof possible security and privacy concerns, risks, and limitations associated with providing "not-in-person" medical evaluation and management services. I also informed Ms. ABendixof the availability of "in-person" appointments. Finally, I informed her that there would be a charge for the virtual visit and that she could be  personally, fully or partially, financially responsible for it. [name] understanding and agreed to proceed.   Historic Elements   Ms. Teresa DRUCKENMILLERis a 64y.o. year old, female patient evaluated today after her last contact with our practice on 10/17/2019. [name] has a past medical history of Headache, History of fractured vertebra, Hyperlipidemia, Low bone density, and Transfusion of blood product refused for religious reason. She also  has a past surgical history that includes Abdominal hysterectomy; Colonoscopy (2000); Eye surgery (Left, 2014); and Artery Biopsy (Left, 12/18/2015). Ms. AGoldingerhas a current medication list which includes the following prescription(s): alendronate, calcium, cholecalciferol, ferrous gluconate, flector, gabapentin, multivitamin, nortriptyline, topiramate, trazodone, zolpidem, ezetimibe, oxycodone-acetaminophen, and propranolol. She  reports that she has never smoked. She has never used smokeless tobacco. She reports that she does not drink alcohol or use drugs. [name] allergic to codeine and morphine.   HPI  Today, she is being contacted for worsening of previously known (established) problem and MM.  Patient continues to endorse persistent left shoulder pain and  pain in her periscapular region.  This  is been present for many years has gotten worse.  She saw Dr. Leia Alf at [facility] for an initial for initial evaluation.  She had left shoulder trigger point injections done at that time.  She states that this amplified her pain and made it worse.  Patient had left shoulder x-ray done on 11/03/2019 which shows mild to moderate degenerative changes of the acromioclavicular joint as well as the glenohumeral joint and a posterior osteophyte formation.  Patient endorses greater pain in the posterior shoulder region as compared to the anterior region.  We discussed diagnostic left shoulder steroid injection, posterior approach, under fluoroscopy.  Risks and benefits reviewed and patient like to proceed.  We will also refill medications as below.  Of note, the patient's last opioid fill of oxycodone 5 mg was on 08/18/2019.  She utilizes this medication very sparingly but it does help her function.  She states that over the last 3 to 4 weeks she has been using it more frequently given her acute and worsening left shoulder pain.  Pharmacotherapy Assessment  Analgesic: 08/18/2019  1   08/10/2019  Oxycodone-Acetaminophen 5-325  60.00  30 Bi Lat   [id number]   Nor (0921)   0  15.00 MME  Private Pay   [place]    Monitoring: [place] PMP: PDMP reviewed during this encounter.       Pharmacotherapy: No side-effects or adverse reactions reported. Compliance: No problems identified. Effectiveness: Clinically acceptable. Plan: Refer to "POC".  UDS:  Summary  Date Value Ref Range Status  12/27/2018 FINAL  Final    Comment:    ==================================================================== TOXASSURE COMP DRUG ANALYSIS,UR ==================================================================== Test                             Result       Flag       Units Drug Present and Declared for Prescription Verification   Gabapentin                     PRESENT      EXPECTED   Topiramate                      PRESENT      EXPECTED   Zolpidem                       PRESENT      EXPECTED   Zolpidem Acid                  PRESENT      EXPECTED    Zolpidem acid is an expected metabolite of zolpidem.   Nortriptyline                  PRESENT      EXPECTED    Nortriptyline may be administered as a prescription drug; it is    also an expected metabolite of amitriptyline.   Trazodone                      PRESENT      EXPECTED   1,3 chlorophenyl piperazine    PRESENT      EXPECTED    1,3-chlorophenyl piperazine is an expected metabolite of    trazodone.   Acetaminophen                  PRESENT      EXPECTED Drug  Present not Declared for Prescription Verification   Tramadol                       1895         UNEXPECTED ng/mg creat   O-Desmethyltramadol            2338         UNEXPECTED ng/mg creat   N-Desmethyltramadol            875          UNEXPECTED ng/mg creat    Source of tramadol is a prescription medication.    O-desmethyltramadol and N-desmethyltramadol are expected    metabolites of tramadol.   Lidocaine                      PRESENT      UNEXPECTED Drug Absent but Declared for Prescription Verification   Hydrocodone                    Not Detected UNEXPECTED ng/mg creat   Diclofenac                     Not Detected UNEXPECTED    Diclofenac, as indicated in the declared medication list, is not    always detected even when used as directed.   Propranolol                    Not Detected UNEXPECTED ==================================================================== Test                      Result    Flag   Units      Ref Range   Creatinine              136              mg/dL      >=20 ==================================================================== Declared Medications:  The flagging and interpretation on this report are based on the  following declared medications.  Unexpected results may arise from  inaccuracies in the declared medications.  **Note: The testing scope  of this panel includes these medications:  Gabapentin  Hydrocodone (Hydrocodone-Acetaminophen)  Nortriptyline  Propranolol  Topiramate  Trazodone  **Note: The testing scope of this panel does not include small to  moderate amounts of these reported medications:  Acetaminophen (Hydrocodone-Acetaminophen)  Diclofenac  Zolpidem  **Note: The testing scope of this panel does not include following  reported medications:  Alendronate  Calcium  Cholecalciferol  Ezetimibe  Ferrous Gluconate  Magnesium  Multivitamin ==================================================================== For clinical consultation, please call [phone]. ====================================================================    Laboratory Chemistry Profile   Renal Lab Results  Component Value Date   BUN 11 08/14/2014   CREATININE 1.10 (H) 02/25/2018   GFRAA >90 08/14/2014   GFRNONAA 89 (L) 08/14/2014    Hepatic No results found for: AST, ALT, ALBUMIN, ALKPHOS, HCVAB, AMYLASE, LIPASE, AMMONIA  Electrolytes Lab Results  Component Value Date   NA 139 08/14/2014   K 4.0 08/14/2014   CL 99 08/14/2014   CALCIUM 10.2 08/14/2014    Bone No results found for: VD25OH, VD125OH2TOT, NW2956OZ3, YQ6578IO9, 25OHVITD1, 25OHVITD2, 25OHVITD3, TESTOFREE, TESTOSTERONE  Inflammation (CRP: Acute Phase) (ESR: Chronic Phase) No results found for: CRP, ESRSEDRATE, LATICACIDVEN    Note: Above Lab results reviewed.   Assessment  The primary encounter diagnosis was Arthritis of left glenohumeral joint. Diagnoses of Primary osteoarthritis of left shoulder, Left rotator  cuff tear arthropathy, and Chronic pain syndrome were also pertinent to this visit.  Plan of Care   Teresa [name] has a current medication list which includes the following long-term medication(s): ferrous gluconate, gabapentin, topiramate, trazodone, zolpidem, ezetimibe, and propranolol.  1. Arthritis of left glenohumeral joint -Mild to  moderate left glenohumeral arthropathy.  Tried left shoulder trigger point injections which were not helpful.  Discussed left intra-articular glenohumeral joint injection under fluoroscopy, posterior approach - SHOULDER INJECTION; Future  2. Primary osteoarthritis of left shoulder -Left shoulder injection as above  3. Left rotator cuff tear arthropathy -Left shoulder injection as above  4. Chronic pain syndrome -Continue gabapentin 300 mg 3 times daily - oxyCODONE-acetaminophen (PERCOCET) 5-325 MG tablet; Take 1 tablet by mouth 2 (two) times daily as needed for severe pain. Must last 30 days.  Dispense: 60 tablet; Refill: 0   Pharmacotherapy (Medications Ordered): Meds ordered this encounter  Medications  . oxyCODONE-acetaminophen (PERCOCET) 5-325 MG tablet    Sig: Take 1 tablet by mouth 2 (two) times daily as needed for severe pain. Must last 30 days.    Dispense:  60 tablet    Refill:  0    Chronic Pain. (STOP Act - Not applicable). Fill one day early if closed on scheduled refill date.   Orders:  Orders Placed This Encounter  Procedures  . SHOULDER INJECTION    Standing Status:   Future    Standing Expiration Date:   01/21/2020    Scheduling Instructions:     Left shoulder      Timeframe: As soon as schedule allows    Order Specific Question:   Where will this procedure be performed?    Answer:   [facility]    Comments:   Talajah Slimp   Follow-up plan:   Return in about 6 days (around 12/28/2019) for left shoulder injection (posterior), without sedation.     Status post left L3, L4, L5, S1 RFA on 06/15/2019, R L3,4,5, S1 RFA on 06/29/2019,     Recent Visits Date Type Provider Dept  10/17/19 Office Visit Gillis Santa, MD [facility]  Showing recent visits within past 90 days and meeting all other requirements   Today's Visits Date Type Provider Dept  12/22/19 Office Visit Gillis Santa, MD [facility]  Showing today's visits and meeting all other  requirements   Future Appointments No visits were found meeting these conditions.  Showing future appointments within next 90 days and meeting all other requirements   I discussed the assessment and treatment plan with the patient. The patient was provided an opportunity to ask questions and all were answered. The patient agreed with the plan and demonstrated an understanding of the instructions.  Patient advised to call back or seek an in-person evaluation if the symptoms or condition worsens.  Duration of encounter: 25 minutes.  Note by: Gillis Santa, MD Date: 12/22/2019; Time: 11:31 AM

## 2020-01-02 ENCOUNTER — Ambulatory Visit
Admission: RE | Admit: 2020-01-02 | Discharge: 2020-01-02 | Disposition: A | Discharge status: Home / Self Care | Payer: Medicare Other | Source: Ambulatory Visit | Attending: Student in an Organized Health Care Education/Training Program | Admitting: Student in an Organized Health Care Education/Training Program

## 2020-01-02 ENCOUNTER — Encounter: Payer: Self-pay | Admitting: Student in an Organized Health Care Education/Training Program

## 2020-01-02 ENCOUNTER — Other Ambulatory Visit: Payer: Self-pay

## 2020-01-02 ENCOUNTER — Ambulatory Visit
Admission: RE | Admit: 2020-01-02 | Discharge: 2020-01-02 | Disposition: A | Discharge status: Home / Self Care | Payer: Medicare Other | Source: Home / Self Care | Attending: Student in an Organized Health Care Education/Training Program | Admitting: Student in an Organized Health Care Education/Training Program

## 2020-01-02 ENCOUNTER — Ambulatory Visit (HOSPITAL_BASED_OUTPATIENT_CLINIC_OR_DEPARTMENT_OTHER): Payer: Medicare Other | Admitting: Student in an Organized Health Care Education/Training Program

## 2020-01-02 VITALS — BP 150/86 | HR 97 | Temp 97.3°F | Resp 16 | Ht 66.0 in | Wt 135.0 lb

## 2020-01-02 DIAGNOSIS — M19012 Primary osteoarthritis, left shoulder: Secondary | ICD-10-CM | POA: Insufficient documentation

## 2020-01-02 DIAGNOSIS — M533 Sacrococcygeal disorders, not elsewhere classified: Secondary | ICD-10-CM | POA: Insufficient documentation

## 2020-01-02 MED ORDER — LIDOCAINE HCL 2 % IJ SOLN
20.0000 mL | Freq: Once | INTRAMUSCULAR | Status: AC
  Administered 2020-01-02: 400 mg

## 2020-01-02 MED ORDER — DEXAMETHASONE SODIUM PHOSPHATE 10 MG/ML IJ SOLN
INTRAMUSCULAR | Status: AC
  Filled 2020-01-02: qty 1

## 2020-01-02 MED ORDER — LIDOCAINE HCL 2 % IJ SOLN
INTRAMUSCULAR | Status: AC
  Filled 2020-01-02: qty 20

## 2020-01-02 MED ORDER — DEXAMETHASONE SODIUM PHOSPHATE 10 MG/ML IJ SOLN: 10.0000 mg | Freq: Once | INTRAMUSCULAR | Status: DC

## 2020-01-02 MED ORDER — IOHEXOL 180 MG/ML  SOLN
INTRAMUSCULAR | Status: AC
  Filled 2020-01-02: qty 20

## 2020-01-02 MED ORDER — ROPIVACAINE HCL 2 MG/ML IJ SOLN
INTRAMUSCULAR | Status: AC
  Filled 2020-01-02: qty 10

## 2020-01-02 MED ORDER — METHYLPREDNISOLONE ACETATE 40 MG/ML IJ SUSP
INTRAMUSCULAR | Status: AC
  Filled 2020-01-02: qty 1

## 2020-01-02 MED ORDER — METHYLPREDNISOLONE ACETATE 40 MG/ML IJ SUSP
40.0000 mg | Freq: Once | INTRAMUSCULAR | Status: AC
  Administered 2020-01-02: 40 mg

## 2020-01-02 MED ORDER — ROPIVACAINE HCL 2 MG/ML IJ SOLN
4.0000 mL | Freq: Once | INTRAMUSCULAR | Status: AC
  Administered 2020-01-02: 4 mL via INTRA_ARTICULAR

## 2020-01-02 NOTE — Progress Notes (Signed)
PROVIDER NOTE: Information contained herein reflects review and annotations entered in association with encounter. Interpretation of such information and data should be left to medically-trained personnel. Information provided to patient can be located elsewhere in the medical record under "Patient Instructions". Document created using STT-dictation technology, any transcriptional errors that may result from process are unintentional.    Patient: Teresa [name]  Service Category: Procedure  Provider: Gillis Santa, MD  DOB: May 28, 1956  DOS: 01/02/2020  Location: [facility]  MRN: [id number]  Setting: Ambulatory - outpatient  Referring Provider: Tracie Harrier, MD  Type: Established Patient  Specialty: Interventional Pain Management  PCP: Tracie Harrier, MD   Primary Reason for Visit: Interventional Pain Management Treatment. CC: Shoulder Pain (left)  Procedure:          Anesthesia, Analgesia, Anxiolysis:  Type: Diagnostic Glenohumeral Joint (shoulder) Injection #1  Primary Purpose: Diagnostic Region: Superior Shoulder Area Level:  Shoulder Target Area: Glenohumeral Joint (shoulder) Approach: Posterior approach. Laterality: Left-Sided  Type: Local Anesthesia  Local Anesthetic: Lidocaine 1-2%  Position: Supine   Indications: 1. Arthritis of left glenohumeral joint   2. Sacroiliac joint pain    Pain Score: Pre-procedure: 3 /10 Post-procedure: 3 /10   Pre-op Assessment:  Teresa [name] is a 64 y.o. (year old), female patient, seen today for interventional treatment. She  has a past surgical history that includes Abdominal hysterectomy; Colonoscopy (2000); Eye surgery (Left, 2014); and Artery Biopsy (Left, 12/18/2015). Teresa [name] has a current medication list which includes the following prescription(s): alendronate, calcium, cholecalciferol, ferrous gluconate, flector, gabapentin, multivitamin, nortriptyline, oxycodone-acetaminophen, topiramate, trazodone, zolpidem,  ezetimibe, and propranolol. Her primarily concern today is the Shoulder Pain (left)  Initial Vital Signs:  Pulse/HCG Rate: 97ECG Heart Rate: 81 Temp: (!) 97.3 F (36.3 C) Resp: 18 BP: 123/63 SpO2: 98 %  BMI: Estimated body mass index is 21.79 kg/m as calculated from the following:   Height as of this encounter: 5\' 6"  (1.676 m).   Weight as of this encounter: 135 lb (61.2 kg).  Risk Assessment: Allergies: Reviewed. She is allergic to elastic bandages & [zinc]; codeine; and morphine.  Allergy Precautions: None required Coagulopathies: Reviewed. None identified.  Blood-thinner therapy: None at this time Active Infection(s): Reviewed. None identified. Teresa [name] is afebrile  Site Confirmation: Teresa [name] was asked to confirm the procedure and laterality before marking the site Procedure checklist: Completed Consent: Before the procedure and under the influence of no sedative(s), amnesic(s), or anxiolytics, the patient was informed of the treatment options, risks and possible complications. To fulfill our ethical and legal obligations, as recommended by the American Medical Association's Code of Ethics, I have informed the patient of my clinical impression; the nature and purpose of the treatment or procedure; the risks, benefits, and possible complications of the intervention; the alternatives, including doing nothing; the risk(s) and benefit(s) of the alternative treatment(s) or procedure(s); and the risk(s) and benefit(s) of doing nothing. The patient was provided information about the general risks and possible complications associated with the procedure. These may include, but are not limited to: failure to achieve desired goals, infection, bleeding, organ or nerve damage, allergic reactions, paralysis, and death. In addition, the patient was informed of those risks and complications associated to the procedure, such as failure to decrease pain; infection; bleeding; organ or nerve damage  with subsequent damage to sensory, motor, and/or autonomic systems, resulting in permanent pain, numbness, and/or weakness of one or several areas of the body; allergic reactions; (i.e.: anaphylactic reaction); and/or death.  Furthermore, the patient was informed of those risks and complications associated with the medications. These include, but are not limited to: allergic reactions (i.e.: anaphylactic or anaphylactoid reaction(s)); adrenal axis suppression; blood sugar elevation that in diabetics may result in ketoacidosis or comma; water retention that in patients with history of congestive heart failure may result in shortness of breath, pulmonary edema, and decompensation with resultant heart failure; weight gain; swelling or edema; medication-induced neural toxicity; particulate matter embolism and blood vessel occlusion with resultant organ, and/or nervous system infarction; and/or aseptic necrosis of one or more joints. Finally, the patient was informed that Medicine is not an exact science; therefore, there is also the possibility of unforeseen or unpredictable risks and/or possible complications that may result in a catastrophic outcome. The patient indicated having understood very clearly. We have given the patient no guarantees and we have made no promises. Enough time was given to the patient to ask questions, all of which were answered to the patient's satisfaction. Teresa [name] has indicated that she wanted to continue with the procedure. Attestation: I, the ordering provider, attest that I have discussed with the patient the benefits, risks, side-effects, alternatives, likelihood of achieving goals, and potential problems during recovery for the procedure that I have provided informed consent. Date   Time: 01/02/2020  8:26 AM  Pre-Procedure Preparation:  Monitoring: As per clinic protocol. Respiration, ETCO2, SpO2, BP, heart rate and rhythm monitor placed and checked for adequate function Safety  Precautions: Patient was assessed for positional comfort and pressure points before starting the procedure. Time-out: I initiated and conducted the "Time-out" before starting the procedure, as per protocol. The patient was asked to participate by confirming the accuracy of the "Time Out" information. Verification of the correct person, site, and procedure were performed and confirmed by me, the nursing staff, and the patient. "Time-out" conducted as per Joint Commission's Universal Protocol (UP.01.01.01). Time: 0930  Description of Procedure:          Area Prepped: Entire shoulder Area Prepping solution: DuraPrep (Iodine Povacrylex [0.7% available iodine] and Isopropyl Alcohol, 74% w/w) Safety Precautions: Aspiration looking for blood return was conducted prior to all injections. At no point did we inject any substances, as a needle was being advanced. No attempts were made at seeking any paresthesias. Safe injection practices and needle disposal techniques used. Medications properly checked for expiration dates. SDV (single dose vial) medications used. Description of the Procedure: Protocol guidelines were followed. The patient was placed in position over the procedure table. The target area was identified and the area prepped in the usual manner. Skin & deeper tissues infiltrated with local anesthetic. Appropriate amount of time allowed to pass for local anesthetics to take effect. The procedure needles were then advanced to the target area. Proper needle placement secured. Negative aspiration confirmed. Solution injected in intermittent fashion, asking for systemic symptoms every 0.5cc of injectate. The needles were then removed and the area cleansed, making sure to leave some of the prepping solution back to take advantage of its long term bactericidal properties.    Vitals:   01/02/20 0926 01/02/20 0931 01/02/20 0936 01/02/20 0940  BP: 140/79 (!) 141/86 137/83 (!) 150/86  Pulse:      Resp: 16 16  16 16   Temp:      SpO2: 100% 100% 100% 100%  Weight:      Height:        Start Time: 0930 hrs. [name] Time: 0940 hrs. Materials:  Needle(s) Type: Spinal Needle Gauge: 22G  Length: 3.5-in Medication(s): Please see orders for medications and dosing details.  Imaging Guidance (Non-Spinal):          Type of Imaging Technique: Fluoroscopy Guidance (Non-Spinal) Indication(s): Assistance in needle guidance and placement for procedures requiring needle placement in or near specific anatomical locations not easily accessible without such assistance. Exposure Time: Please see nurses notes. Contrast: None used. Fluoroscopic Guidance: I was personally present during the use of fluoroscopy. "Tunnel Vision Technique" used to obtain the best possible view of the target area. Parallax error corrected before commencing the procedure. "Direction-depth-direction" technique used to introduce the needle under continuous pulsed fluoroscopy. Once target was reached, antero-posterior, oblique, and lateral fluoroscopic projection used confirm needle placement in all planes. Images permanently stored in EMR. Interpretation: No contrast injected. I personally interpreted the imaging intraoperatively. Adequate needle placement confirmed in multiple planes. Permanent images saved into the patient's record.  Antibiotic Prophylaxis:   Anti-infectives (From admission, onward)   None     Indication(s): None identified  Post-operative Assessment:  Post-procedure Vital Signs:  Pulse/HCG Rate: 9790 Temp: (!) 97.3 F (36.3 C) Resp: 16 BP: (!) 150/86 SpO2: 100 %  EBL: None  Complications: No immediate post-treatment complications observed by team, or reported by patient.  Note: The patient tolerated the entire procedure well. A repeat set of vitals were taken after the procedure and the patient was kept under observation following institutional policy, for this type of procedure. Post-procedural neurological  assessment was performed, showing return to baseline, prior to discharge. The patient was provided with post-procedure discharge instructions, including a section on how to identify potential problems. Should any problems arise concerning this procedure, the patient was given instructions to immediately contact us, at any time, without hesitation. In any case, we plan to contact the patient by telephone for a follow-up status report regarding this interventional procedure.  Comments:  No additional relevant information.  Plan of Care  Patient is endorsing lower back, buttock pain.  Will obtain SI joint x-ray to evaluate for SI joint arthritis that could be contributing to her symptoms.  Orders:  Orders Placed This Encounter  Procedures   DG PAIN CLIN[facility] C-ARM 1-60 MIN NO REPORT    Intraoperative interpretation by procedural physician at [facility].    Standing Status:   Standing    Number of Occurrences:   1    Order Specific Question:   Reason for exam:    Answer:   Assistance in needle guidance and placement for procedures requiring needle placement in or near specific anatomical locations not easily accessible without such assistance.   DG Si Joints    Standing Status:   Future    Number of Occurrences:   1    Standing Expiration Date:   04/03/2020    Order Specific Question:   Reason for Exam (SYMPTOM  OR DIAGNOSIS REQUIRED)    Answer:   Sacroiliac joint pain    Order Specific Question:   Preferred imaging location?    Answer:   [facility]    Order Specific Question:   Call Results- Best Contact Number?    Answer:   [phone] ([facility])    Medications ordered for procedure: Meds ordered this encounter  Medications   lidocaine (XYLOCAINE) 2 % (with pres) injection 400 mg   DISCONTD: dexamethasone (DECADRON) injection 10 mg   ropivacaine (PF) 2 mg/mL (0.2%) (NAROPIN) injection 4 mL   methylPREDNISolone acetate (DEPO-MEDROL) injection 40 mg    Medications administered: We administered  lidocaine, ropivacaine (PF) 2 mg/mL (0.2%), and methylPREDNISolone acetate.  See the medical record for exact dosing, route, and time of administration.  Follow-up plan:   Return in about 5 weeks (around 02/06/2020) for Post Procedure Evaluation, virtual.      Status post left L3, L4, L5, S1 RFA on 06/15/2019, R L3,4,5, S1 RFA on 06/29/2019, left posterior glenohumeral shoulder steroid injection 01/02/2020     Recent Visits Date Type Provider Dept  12/22/19 Office Visit Gillis Santa, MD [facility]  10/17/19 Office Visit Gillis Santa, MD [facility]  Showing recent visits within past 90 days and meeting all other requirements   Today's Visits Date Type Provider Dept  01/02/20 Procedure visit Gillis Santa, MD [facility]  Showing today's visits and meeting all other requirements   Future Appointments Date Type Provider Dept  02/07/20 Appointment Gillis Santa, MD [facility]  Showing future appointments within next 90 days and meeting all other requirements   Disposition: Discharge home  Discharge (Date   Time): 01/02/2020; 0955 hrs.   Primary Care Physician: Tracie Harrier, MD Location: [facility] Note by: Gillis Santa, MD Date: 01/02/2020; Time: 12:30 PM  Disclaimer:  Medicine is not an exact science. The only guarantee in medicine is that nothing is guaranteed. It is important to note that the decision to proceed with this intervention was based on the information collected from the patient. The Data and conclusions were drawn from the patient's questionnaire, the interview, and the physical examination. Because the information was provided in large part by the patient, it cannot be guaranteed that it has not been purposely or unconsciously manipulated. Every effort has been made to obtain as much relevant data as possible for this evaluation. It is important to note that  the conclusions that lead to this procedure are derived in large part from the available data. Always take into account that the treatment will also be dependent on availability of resources and existing treatment guidelines, considered by other Pain Management Practitioners as being common knowledge and practice, at the time of the intervention. For Medico-Legal purposes, it is also important to point out that variation in procedural techniques and pharmacological choices are the acceptable norm. The indications, contraindications, technique, and results of the above procedure should only be interpreted and judged by a Board-Certified Interventional Pain Specialist with extensive familiarity and expertise in the same exact procedure and technique.

## 2020-01-02 NOTE — Patient Instructions (Signed)
Pain Management Discharge Instructions  General Discharge Instructions :  If you need to reach your doctor call: Monday-Friday 8:00 am - 4:00 pm at [phone] or toll free [phone].  After clinic hours [phone] to have operator reach doctor.  Bring all of your medication bottles to all your appointments in the pain clinic.  To cancel or reschedule your appointment with Pain Management please remember to call 24 hours in advance to avoid a fee.  Refer to the educational materials which you have been given on: General Risks, I had my Procedure. Discharge Instructions, Post Sedation.  Post Procedure Instructions:  The drugs you were given will stay in your system until tomorrow, so for the next 24 hours you should not drive, make any legal decisions or drink any alcoholic beverages.  You may eat anything you prefer, but it is better to start with liquids then soups and crackers, and gradually work up to solid foods.  Please notify your doctor immediately if you have any unusual bleeding, trouble breathing or pain that is not related to your normal pain.  Depending on the type of procedure that was done, some parts of your body may feel week and/or numb.  This usually clears up by tonight or the next day.  Walk with the use of an assistive device or accompanied by an adult for the 24 hours.  You may use ice on the affected area for the first 24 hours.  Put ice in a Ziploc bag and cover with a towel and place against area 15 minutes on 15 minutes off.  You may switch to heat after 24 hours.Trigger Point Injection Trigger points are areas where you have pain. A trigger point injection is a shot given in the trigger point to help relieve pain for a few days to a few months. Common places for trigger points include:  The neck.  The shoulders.  The upper back.  The lower back. A trigger point injection will not cure long-term (chronic) pain permanently. These injections do not  always work for every person. For some people, they can help to relieve pain for a few days to a few months. Tell a health care provider about:  Any allergies you have.  All medicines you are taking, including vitamins, herbs, eye drops, creams, and over-the-counter medicines.  Any problems you or family members have had with anesthetic medicines.  Any blood disorders you have.  Any surgeries you have had.  Any medical conditions you have. What are the risks? Generally, this is a safe procedure. However, problems may occur, including:  Infection.  Bleeding or bruising.  Allergic reaction to the injected medicine.  Irritation of the skin around the injection site. What happens before the procedure? Ask your health care provider about:  Changing or stopping your regular medicines. This is especially important if you are taking diabetes medicines or blood thinners.  Taking medicines such as aspirin and ibuprofen. These medicines can thin your blood. Do not take these medicines unless your health care provider tells you to take them.  Taking over-the-counter medicines, vitamins, herbs, and supplements. What happens during the procedure?   Your health care provider will feel for trigger points. A marker may be used to circle the area for the injection.  The skin over the trigger point will be washed with a germ-killing (antiseptic) solution.  A thin needle is used for the injection. You may feel pain or a twitching feeling when the needle enters the trigger point.    A numbing solution may be injected into the trigger point. Sometimes a medicine to keep down inflammation is also injected.  Your health care provider may move the needle around the area where the trigger point is located until the tightness and twitching goes away.  After the injection, your health care provider may put gentle pressure over the injection site.  The injection site will be covered with a bandage  (dressing). The procedure may vary among health care providers and hospitals. What can I expect after treatment? After treatment, you may have:  Soreness and stiffness for 1-2 days.  A dressing. This can be taken off in a few hours or as told by your health care provider. Follow these instructions at home: Injection site care  Remove your dressing as told by your health care provider.  Check your injection site every day for signs of infection. Check for: ? Redness, swelling, or pain. ? Fluid or blood. ? Warmth. ? Pus or a bad smell. Managing pain, stiffness, and swelling  If directed, put ice on the affected area. ? Put ice in a plastic bag. ? Place a towel between your skin and the bag. ? Leave the ice on for 20 minutes, 2-3 times a day. General instructions  If you were asked to stop your regular medicines, ask your health care provider when you may start taking them again.  Return to your normal activities as told by your health care provider. Ask your health care provider what activities are safe for you.  Do not take baths, swim, or use a hot tub until your health care provider approves.  You may be asked to see an occupational or physical therapist for exercises that reduce muscle strain and stretch the area of the trigger point.  Keep all follow-up visits as told by your health care provider. This is important. Contact a health care provider if:  Your pain comes back, and it is worse than before the injection. You may need more injections.  You have chills or a fever.  The injection site becomes more painful, red, swollen, or warm to the touch. Summary  A trigger point injection is a shot given in the trigger point to help relieve pain for a few days to a few months.  Common places for trigger point injections are the neck, shoulder, upper back, and lower back.  These injections do not always work for every person, but for some people, the injections can help  to relieve pain for a few days to a few months.  Contact a health care provider if symptoms come back or they are worse than before treatment. Also, get help if the injection site becomes more painful, red, swollen, or warm to the touch. This information is not intended to replace advice given to you by your health care provider. Make sure you discuss any questions you have with your health care provider. Document Revised: 12/01/2018 Document Reviewed: 12/01/2018 Elsevier Patient Education  2020 [employer]  

## 2020-01-03 ENCOUNTER — Telehealth: Payer: Self-pay

## 2020-01-03 NOTE — Telephone Encounter (Signed)
Pt was called and message was left on answering service. 

## 2020-02-07 ENCOUNTER — Other Ambulatory Visit: Payer: Self-pay

## 2020-02-07 ENCOUNTER — Encounter: Payer: Self-pay | Admitting: Student in an Organized Health Care Education/Training Program

## 2020-02-07 ENCOUNTER — Ambulatory Visit
Payer: Medicare Other | Attending: Student in an Organized Health Care Education/Training Program | Admitting: Student in an Organized Health Care Education/Training Program

## 2020-02-07 DIAGNOSIS — M75102 Unspecified rotator cuff tear or rupture of left shoulder, not specified as traumatic: Secondary | ICD-10-CM

## 2020-02-07 DIAGNOSIS — M12812 Other specific arthropathies, not elsewhere classified, left shoulder: Secondary | ICD-10-CM

## 2020-02-07 DIAGNOSIS — G894 Chronic pain syndrome: Secondary | ICD-10-CM

## 2020-02-07 DIAGNOSIS — M19012 Primary osteoarthritis, left shoulder: Secondary | ICD-10-CM | POA: Diagnosis not present

## 2020-02-07 MED ORDER — TIZANIDINE HCL 4 MG PO TABS: 4.0000 mg | ORAL_TABLET | Freq: Two times a day (BID) | ORAL | 1 refills | Status: DC | PRN

## 2020-02-07 MED ORDER — OXYCODONE-ACETAMINOPHEN 5-325 MG PO TABS: 1.0000 | ORAL_TABLET | Freq: Three times a day (TID) | ORAL | 0 refills | Status: DC | PRN

## 2020-02-07 MED ORDER — OXYCODONE-ACETAMINOPHEN 5-325 MG PO TABS: 1.0000 | ORAL_TABLET | Freq: Three times a day (TID) | ORAL | 0 refills | Status: AC | PRN

## 2020-02-07 NOTE — Progress Notes (Signed)
Patient: Teresa [name]  Service Category: E/M  Provider: Gillis Santa, MD  DOB: [birth year]  DOS: 02/07/2020  Location: Office  MRN: [id number]  Setting: Ambulatory outpatient  Referring Provider: Tracie Harrier, MD  Type: Established Patient  Specialty: Interventional Pain Management  PCP: Tracie Harrier, MD  Location: Home  Delivery: TeleHealth     Virtual Encounter - Pain Management PROVIDER NOTE: Information contained herein reflects review and annotations entered in association with encounter. Interpretation of such information and data should be left to medically-trained personnel. Information provided to patient can be located elsewhere in the medical record under "Patient Instructions". Document created using STT-dictation technology, any transcriptional errors that may result from process are unintentional.    Contact & Pharmacy Preferred: [place]: [phone] (home) Mobile: [phone] (mobile) E-mail: [email or web address]  [id number] [place] [id number]: [phone]: [phone]  Pre-screening  Teresa [name] "in-person" vs "virtual" encounter. She indicated preferring virtual for this encounter.   Reason COVID-19*  Social distancing based on CDC and AMA recommendations.   I contacted Teresa [name] 02/07/2020 via telephone.      I clearly identified myself as BGillis Santa MD. I verified that I was speaking with the correct person using two identifiers (Name: Teresa [name] and date of birth: [id number].  This visit was completed via telephone due to the restrictions of the COVID-19 pandemic. All issues as above were discussed and addressed but no physical exam was performed. If it was felt that the patient should be evaluated in the office, they were directed there. The patient verbally consented to this visit. Patient was unable to complete an audio/visual visit due to  Technical difficulties and/or Lack of internet. Due to the catastrophic nature of the COVID-19 pandemic, this visit was done through audio contact only.  Location of the patient: home address (see Epic for details)  Location of the provider: office  Consent I sought verbal advanced consent from Teresa Endfor virtual visit interactions. I informed Ms. AHolwerdaof possible security and privacy concerns, risks, and limitations associated with providing "not-in-person" medical evaluation and management services. I also informed Ms. ATelfordof the availability of "in-person" appointments. Finally, I informed her that there would be a charge for the virtual visit and that she could be  personally, fully or partially, financially responsible for it. Teresa [name] understanding and agreed to proceed.   Historic Elements   Ms. DSADEEN WIEGELis a 64y.o. 64y.o. year old, female patient evaluated today after her last contact with our practice on 01/03/2020. Teresa [name] a past medical history of Headache, History of fractured vertebra, Hyperlipidemia, Low bone density, and Transfusion of blood product refused for religious reason. She also  [name] a past surgical history that includes Abdominal hysterectomy; Colonoscopy (2000); Eye surgery (Left, 2014); and Artery Biopsy (Left, 12/18/2015). Ms. AGreesonhas a current medication list which includes the following prescription(s): alendronate, calcium, cholecalciferol, ferrous gluconate, flector, gabapentin, multivitamin, nortriptyline, topiramate, trazodone, zolpidem, oxycodone-acetaminophen, [START ON 03/08/2020] oxycodone-acetaminophen, and tizanidine. She  reports that she [name] never smoked. She [name] never used smokeless tobacco. She reports that she does not drink alcohol or use drugs. Ms. ALanpheris allergic to elastic bandages & [zinc]; codeine; and morphine.   HPI  Today, she is being contacted for a post-procedure assessment.   No benefit after left  glenohumeral joint injection, still having pain with shoulder  abduction. Discussed suprascapular nerve block  Evaluation of last interventional procedure  01/02/2020 Procedure:  Type: Diagnostic Glenohumeral Joint (shoulder) Injection #1  Primary Purpose: Diagnostic Region: Superior Shoulder Area Level:  Shoulder Target Area: Glenohumeral Joint (shoulder) Approach: Posterior approach. Laterality: Left-Sided  Sedation: Please see nurses note for DOS. When no sedatives are used, the analgesic levels obtained are directly associated to the effectiveness of the local anesthetics. However, when sedation is provided, the level of analgesia obtained during the initial 1 hour following the intervention, is believed to be the result of a combination of factors. These factors may include, but are not limited to: 1. The effectiveness of the local anesthetics used. 2. The effects of the analgesic(s) and/or anxiolytic(s) used. 3. The degree of discomfort experienced by the patient at the time of the procedure. 4. The patients ability and reliability in recalling and recording the events. 5. The presence and influence of possible secondary gains and/or psychosocial factors. Reported result: Relief experienced during the 1st hour after the procedure: 100 % (Ultra-Short Term Relief)            Interpretative annotation: Clinically appropriate result. Analgesia during this period is likely to be Local Anesthetic and/or IV Sedative (Analgesic/Anxiolytic) related.          Effects of local anesthetic: The analgesic effects attained during this period are directly associated to the localized infiltration of local anesthetics and therefore cary significant diagnostic value as to the etiological location, or anatomical origin, of the pain. Expected duration of relief is directly dependent on the pharmacodynamics of the local anesthetic used. Long-acting (4-6 hours) anesthetics used.  Reported result: Relief during  the next 4 to 6 hour after the procedure: 50 %(after numbnes wore off pt. with severe pain.) (Short-Term Relief)            Interpretative annotation: Clinically appropriate result. Analgesia during this period is likely to be Local Anesthetic-related.          Long-term benefit: Defined as the period of time past the expected duration of local anesthetics (1 hour for short-acting and 4-6 hours for long-acting). With the possible exception of prolonged sympathetic blockade from the local anesthetics, benefits during this period are typically attributed to, or associated with, other factors such as analgesic sensory neuropraxia, antiinflammatory effects, or beneficial biochemical changes provided by agents other than the local anesthetics.  Reported result: Extended relief following procedure: 0 % (Long-Term Relief)            Interpretative annotation: Unexpected result.       Pain appears to be refractory to this treatment modality.         Controlled Substance Pharmacotherapy Assessment REMS (Risk Evaluation and Mitigation Strategy)  Analgesic: Currently on oxycodone 5 mg twice daily as needed however states that on Sunday she does have to utilize a third dose, will increase to 3 times daily as needed, quantity 90/month  Pharmacokinetics: Liberation and absorption (onset of action): WNL Distribution (time to peak effect): WNL Metabolism and excretion (duration of action): WNL         Pharmacodynamics: Desired effects: Analgesia: Ms. Rogstad reports <50% benefit. Functional ability: Patient reports that medication allows her to accomplish basic ADLs Clinically meaningful improvement in function (CMIF): Sustained CMIF goals met Perceived effectiveness: Described as relatively effective but with some room for improvement Undesirable effects: Side-effects or Adverse reactions: None reported Monitoring: [place] PMP: Online review of the past 16-monthperiod conducted. Compliant with practice rules and  regulations Last  UDS on record: Summary  Date Value Ref Range Status  12/27/2018 FINAL  Final    Comment:    ==================================================================== TOXASSURE COMP DRUG ANALYSIS,UR ==================================================================== Test                             Result       Flag       Units Drug Present and Declared for Prescription Verification   Gabapentin                     PRESENT      EXPECTED   Topiramate                     PRESENT      EXPECTED   Zolpidem                       PRESENT      EXPECTED   Zolpidem Acid                  PRESENT      EXPECTED    Zolpidem acid is an expected metabolite of zolpidem.   Nortriptyline                  PRESENT      EXPECTED    Nortriptyline may be administered as a prescription drug; it is    also an expected metabolite of amitriptyline.   Trazodone                      PRESENT      EXPECTED   1,3 chlorophenyl piperazine    PRESENT      EXPECTED    1,3-chlorophenyl piperazine is an expected metabolite of    trazodone.   Acetaminophen                  PRESENT      EXPECTED Drug Present not Declared for Prescription Verification   Tramadol                       1895         UNEXPECTED ng/mg creat   O-Desmethyltramadol            2338         UNEXPECTED ng/mg creat   N-Desmethyltramadol            875          UNEXPECTED ng/mg creat    Source of tramadol is a prescription medication.    O-desmethyltramadol and N-desmethyltramadol are expected    metabolites of tramadol.   Lidocaine                      PRESENT      UNEXPECTED Drug Absent but Declared for Prescription Verification   Hydrocodone                    Not Detected UNEXPECTED ng/mg creat   Diclofenac                     Not Detected UNEXPECTED    Diclofenac, as indicated in the declared medication list, is not    always detected even when used as directed.   Propranolol  Not Detected  UNEXPECTED ==================================================================== Test                      Result    Flag   Units      Ref Range   Creatinine              136              mg/dL      >=20 ==================================================================== Declared Medications:  The flagging and interpretation on this report are based on the  following declared medications.  Unexpected results may arise from  inaccuracies in the declared medications.  **Note: The testing scope of this panel includes these medications:  Gabapentin  Hydrocodone (Hydrocodone-Acetaminophen)  Nortriptyline  Propranolol  Topiramate  Trazodone  **Note: The testing scope of this panel does not include small to  moderate amounts of these reported medications:  Acetaminophen (Hydrocodone-Acetaminophen)  Diclofenac  Zolpidem  **Note: The testing scope of this panel does not include following  reported medications:  Alendronate  Calcium  Cholecalciferol  Ezetimibe  Ferrous Gluconate  Magnesium  Multivitamin ==================================================================== For clinical consultation, please call [phone]. ====================================================================    UDS interpretation: Compliant          Medication Assessment Form: Reviewed. Patient indicates being compliant with therapy Treatment compliance: Compliant Risk Assessment Profile: Aberrant behavior: See initial evaluations. None observed or detected today Comorbid factors increasing risk of overdose: See initial evaluation. No additional risks detected today Opioid risk tool (ORT):  Opioid Risk  01/02/2020  Alcohol 0  Illegal Drugs 0  Rx Drugs 0  Alcohol 0  Illegal Drugs 0  Rx Drugs 0  Age between 16-45 years  -  History of Preadolescent Sexual Abuse -  Psychological Disease 0  Depression 0  Opioid Risk Tool Scoring 0  Opioid Risk Interpretation Low Risk    ORT Scoring  interpretation table:  Score <3 = Low Risk for SUD  Score between 4-7 = Moderate Risk for SUD  Score >8 = High Risk for Opioid Abuse   Risk of substance use disorder (SUD): Low  Risk Mitigation Strategies:  Patient Counseling: Covered Patient-Prescriber Agreement (PPA): Present and active  Notification to other healthcare providers: Done  Pharmacologic Plan: Increase oxycodone to 3 times daily as needed              Laboratory Chemistry Profile   Renal Lab Results  Component Value Date   BUN 11 08/14/2014   CREATININE 1.10 (H) 02/25/2018   GFRAA >90 08/14/2014   GFRNONAA 89 (L) 08/14/2014     Hepatic No results found for: AST, ALT, ALBUMIN, ALKPHOS, HCVAB, AMYLASE, LIPASE, AMMONIA   Electrolytes Lab Results  Component Value Date   NA 139 08/14/2014   K 4.0 08/14/2014   CL 99 08/14/2014   CALCIUM 10.2 08/14/2014     Bone No results found for: VD25OH, VD125OH2TOT, CH8527PO2, UM3536RW4, 25OHVITD1, 25OHVITD2, 25OHVITD3, TESTOFREE, TESTOSTERONE   Inflammation (CRP: Acute Phase) (ESR: Chronic Phase) No results found for: CRP, ESRSEDRATE, LATICACIDVEN     Note: Above Lab results reviewed.  Imaging  DG Si Joints CLINICAL DATA:  64 year old female with mid to low back pain for 2 years with no known injury.  EXAM: BILATERAL SACROILIAC JOINTS - 3+ VIEW  COMPARISON:  Lumbar MRI 12/01/2018.  FINDINGS: Right lateral L5-S1 disc space loss with endplate spurring and sclerosis. Background bone mineralization is within normal limits for age. The SI joints appears symmetric and within normal limits. Sacral  ala appear intact. Grossly intact visible pelvis.  IMPRESSION: 1. SI joints appear within normal limits. 2. Chronic right side L5-S1 degeneration.  Electronically Signed   By: Genevie Ann M.D.   On: 01/02/2020 15:21 DG [facility] C-ARM 1-60 MIN NO REPORT Fluoro was used, but no Radiologist interpretation will be provided.  Please refer to "NOTES" tab for provider  progress note.  Assessment  The primary encounter diagnosis was Arthritis of left glenohumeral joint. Diagnoses of Left rotator cuff tear arthropathy, Primary osteoarthritis of left shoulder, and Chronic pain syndrome were also pertinent to this visit.  Plan of Care  Ms. KATASHA RIGA [name] a current medication list which includes the following long-term medication(s): ferrous gluconate, gabapentin, topiramate, trazodone, and zolpidem.  Pharmacotherapy (Medications Ordered): Meds ordered this encounter  Medications  . tiZANidine (ZANAFLEX) 4 MG tablet    Sig: Take 1 tablet (4 mg total) by mouth 2 (two) times daily as needed for muscle spasms.    Dispense:  60 tablet    Refill:  1  . oxyCODONE-acetaminophen (PERCOCET) 5-325 MG tablet    Sig: Take 1 tablet by mouth every 8 (eight) hours as needed for severe pain. Must last 30 days.    Dispense:  90 tablet    Refill:  0    Chronic Pain. (STOP Act - Not applicable). Fill one day early if closed on scheduled refill date.  Marland Kitchen oxyCODONE-acetaminophen (PERCOCET) 5-325 MG tablet    Sig: Take 1 tablet by mouth every 8 (eight) hours as needed for severe pain. Must last 30 days.    Dispense:  90 tablet    Refill:  0    Chronic Pain. (STOP Act - Not applicable). Fill one day early if closed on scheduled refill date.   Consider suprascapular nerve block in future for left shoulder pain  Follow-up plan:   Return in about 8 weeks (around 04/03/2020) for Medication Management, in person.     Status post left L3, L4, L5, S1 RFA on 06/15/2019, R L3,4,5, S1 RFA on 06/29/2019, left posterior glenohumeral shoulder steroid injection 01/02/2020: Not effective      Recent Visits Date Type Provider Dept  01/02/20 Procedure visit Gillis Santa, MD [facility]  12/22/19 Office Visit Gillis Santa, MD [facility]  Showing recent visits within past 90 days and meeting all other requirements   Today's Visits Date Type Provider Dept  02/07/20  Office Visit Gillis Santa, MD [facility]  Showing today's visits and meeting all other requirements   Future Appointments No visits were found meeting these conditions.  Showing future appointments within next 90 days and meeting all other requirements   I discussed the assessment and treatment plan with the patient. The patient was provided an opportunity to ask questions and all were answered. The patient agreed with the plan and demonstrated an understanding of the instructions.  Patient advised to call back or seek an in-person evaluation if the symptoms or condition worsens.  Duration of encounter: 5mnutes.  Note by: BGillis Santa MD Date: 02/07/2020; Time: 8:25 AM

## 2020-02-29 ENCOUNTER — Other Ambulatory Visit: Payer: Self-pay | Admitting: Student in an Organized Health Care Education/Training Program

## 2020-04-03 ENCOUNTER — Other Ambulatory Visit: Payer: Self-pay

## 2020-04-03 ENCOUNTER — Ambulatory Visit
Payer: Medicare Other | Attending: Student in an Organized Health Care Education/Training Program | Admitting: Student in an Organized Health Care Education/Training Program

## 2020-04-03 ENCOUNTER — Encounter: Payer: Self-pay | Admitting: Student in an Organized Health Care Education/Training Program

## 2020-04-03 VITALS — BP 142/71 | HR 95 | Temp 97.2°F | Resp 18 | Ht 66.0 in | Wt 140.0 lb

## 2020-04-03 DIAGNOSIS — G8929 Other chronic pain: Secondary | ICD-10-CM | POA: Insufficient documentation

## 2020-04-03 DIAGNOSIS — M5416 Radiculopathy, lumbar region: Secondary | ICD-10-CM | POA: Diagnosis not present

## 2020-04-03 DIAGNOSIS — M25512 Pain in left shoulder: Secondary | ICD-10-CM | POA: Diagnosis present

## 2020-04-03 DIAGNOSIS — M47816 Spondylosis without myelopathy or radiculopathy, lumbar region: Secondary | ICD-10-CM | POA: Diagnosis not present

## 2020-04-03 DIAGNOSIS — M67912 Unspecified disorder of synovium and tendon, left shoulder: Secondary | ICD-10-CM | POA: Insufficient documentation

## 2020-04-03 DIAGNOSIS — M5137 Other intervertebral disc degeneration, lumbosacral region: Secondary | ICD-10-CM | POA: Diagnosis present

## 2020-04-03 DIAGNOSIS — G894 Chronic pain syndrome: Secondary | ICD-10-CM | POA: Insufficient documentation

## 2020-04-03 MED ORDER — GABAPENTIN 300 MG PO CAPS: 300.0000 mg | ORAL_CAPSULE | Freq: Three times a day (TID) | ORAL | 2 refills | Status: DC

## 2020-04-03 MED ORDER — OXYCODONE-ACETAMINOPHEN 5-325 MG PO TABS: 1.0000 | ORAL_TABLET | Freq: Three times a day (TID) | ORAL | 0 refills | Status: AC | PRN

## 2020-04-03 MED ORDER — TIZANIDINE HCL 4 MG PO TABS: 4.0000 mg | ORAL_TABLET | Freq: Two times a day (BID) | ORAL | 2 refills | Status: DC | PRN

## 2020-04-03 NOTE — Progress Notes (Signed)
Nursing Pain Medication Assessment:  Safety precautions to be maintained throughout the outpatient stay will include: orient to surroundings, keep bed in low position, maintain call bell within reach at all times, provide assistance with transfer out of bed and ambulation.  Medication Inspection Compliance: Teresa [name] did not comply with our request to bring her pills to be counted. She was reminded that bringing the medication bottles, even when empty, is a requirement.  Medication: None brought in. Pill/Patch Count: None available to be counted. Bottle Appearance: No container available. Did not bring bottle(s) to appointment. Filled Date: N/A Last Medication intake:  Day before yesterday  Reminded to bring pills/pill bottles to all appointments.

## 2020-04-03 NOTE — Progress Notes (Signed)
PROVIDER NOTE: Information contained herein reflects review and annotations entered in association with encounter. Interpretation of such information and data should be left to medically-trained personnel. Information provided to patient can be located elsewhere in the medical record under "Patient Instructions". Document created using STT-dictation technology, any transcriptional errors that may result from process are unintentional.    Patient: Teresa [name]  Service Category: E/M  Provider: Gillis Santa, MD  DOB: 11-21-1955  DOS: 04/03/2020  Referring Provider: Tracie Harrier, MD  MRN: [id number]  Setting: Ambulatory outpatient  PCP: Tracie Harrier, MD  Type: Established Patient  Specialty: Interventional Pain Management    Location: Office  Delivery: Face-to-face     Primary Reason(s) for Visit: Encounter for prescription drug management. (Level of risk: moderate)  CC: Back Pain (low)  HPI  Teresa [name] is a 64 y.o. year old, female patient, who comes today for a medication management evaluation. She has INSOMNIA, CHRONIC; DEPRESSION; MIGRAINE HEADACHE; GERD; Constipation; BACK PAIN, CHRONIC; OSTEOPENIA; Encounter for screening colonoscopy; Essential (primary) hypertension; Iron deficiency anemia; Lumbar radiculopathy (right L5); Lumbar spondylosis; Degeneration of lumbar or lumbosacral intervertebral disc; Chronic pain syndrome; Arthritis of left glenohumeral joint; and Left rotator cuff tear arthropathy on their problem list. Her primarily concern today is the Back Pain (low)  Pain Assessment: Location: Lower Back Radiating: denies Onset: More than a month ago Duration: Chronic pain Quality: Aching Severity: 4 /10 (subjective, self-reported pain score)  Note: Reported level is compatible with observation.                         When using our objective Pain Scale, levels between 6 and 10/10 are said to belong in an emergency room, as it progressively worsens from a 6/10, described as  severely limiting, requiring emergency care not usually available at an outpatient pain management facility. At a 6/10 level, communication becomes difficult and requires great effort. Assistance to reach the emergency department may be required. Facial flushing and profuse sweating along with potentially dangerous increases in heart rate and blood pressure will be evident. Effect on ADL: limits activites Timing: Intermittent Modifying factors: lying down BP: (!) 142/71  HR: 95  [name] was last scheduled for an appointment on 02/29/2020 for medication management. During today's appointment we reviewed Teresa [name]'s chronic pain status, as well as her outpatient medication regimen.  Patient continues to endorse axial low back pain that is worse with sitting, pressure to her low back and persistent standing.  This is been getting worse over the last few months.  She is status post lumbar epidural steroid injection as well as lumbar radiofrequency ablation which provided mild to moderate relief at best.  We discussed sprint peripheral nerve stimulation of her lumbar medial branch nerve given that she has not responded that well to lumbar radiofrequency ablation.  Patient would like to pursue this.  She is also endorsing left shoulder pain that is worse with shoulder abduction.  She has pain with shoulder rotation.  This radiates to her mid scapular region.  X-rays show left shoulder glenohumeral joint arthropathy.  To evaluate for rotator cuff dysfunction, recommend MRI of left shoulder.  Patient is requesting something for anxiety.  She has a hard time falling asleep and is on Ambien.  I recommend that she discuss with her primary care provider.  She denies constipation.  She takes oxycodone as needed maximum 3 tablets a day on most days takes 1 or 2.  The patient  reports no history of drug use. Her body mass index is 22.6 kg/m.  Further details on both, my assessment(s), as well as the proposed  treatment plan, please see below.  Controlled Substance Pharmacotherapy Assessment REMS (Risk Evaluation and Mitigation Strategy)  Analgesic: 02/07/2020  1   02/07/2020  Oxycodone-Acetaminophen 5-325  90.00  30 Bi Lat   [id number]   Nor (0921)   0  22.50 MME  Medicare   Arthur     [name], RN  04/03/2020  8:22 AM  Signed Nursing Pain Medication Assessment:  Safety precautions to be maintained throughout the outpatient stay will include: orient to surroundings, keep bed in low position, maintain call bell within reach at all times, provide assistance with transfer out of bed and ambulation.  Medication Inspection Compliance: Teresa [name] did not comply with our request to bring her pills to be counted. She was reminded that bringing the medication bottles, even when empty, is a requirement.  Medication: None brought in. Pill/Patch Count: None available to be counted. Bottle Appearance: No container available. Did not bring bottle(s) to appointment. Filled Date: N/A Last Medication intake:  Day before yesterday  Reminded to bring pills/pill bottles to all appointments.    Pharmacokinetics: Liberation and absorption (onset of action): WNL Distribution (time to peak effect): WNL Metabolism and excretion (duration of action): WNL         Pharmacodynamics: Desired effects: Analgesia: Teresa [name] reports 50% benefit. Functional ability: Patient reports that medication allows her to accomplish basic ADLs Clinically meaningful improvement in function (CMIF): Sustained CMIF goals met Perceived effectiveness: Described as relatively effective, allowing for increase in activities of daily living (ADL) Undesirable effects: Side-effects or Adverse reactions: None reported Monitoring: [place] PMP: PDMP not reviewed this encounter. Online review of the past 60-monthperiod conducted. Compliant with practice rules and regulations Last UDS on record: Summary  Date Value Ref Range Status  12/27/2018 FINAL   Final    Comment:    ==================================================================== TOXASSURE COMP DRUG ANALYSIS,UR ==================================================================== Test                             Result       Flag       Units Drug Present and Declared for Prescription Verification   Gabapentin                     PRESENT      EXPECTED   Topiramate                     PRESENT      EXPECTED   Zolpidem                       PRESENT      EXPECTED   Zolpidem Acid                  PRESENT      EXPECTED    Zolpidem acid is an expected metabolite of zolpidem.   Nortriptyline                  PRESENT      EXPECTED    Nortriptyline may be administered as a prescription drug; it is    also an expected metabolite of amitriptyline.   Trazodone  PRESENT      EXPECTED   1,3 chlorophenyl piperazine    PRESENT      EXPECTED    1,3-chlorophenyl piperazine is an expected metabolite of    trazodone.   Acetaminophen                  PRESENT      EXPECTED Drug Present not Declared for Prescription Verification   Tramadol                       1895         UNEXPECTED ng/mg creat   O-Desmethyltramadol            2338         UNEXPECTED ng/mg creat   N-Desmethyltramadol            875          UNEXPECTED ng/mg creat    Source of tramadol is a prescription medication.    O-desmethyltramadol and N-desmethyltramadol are expected    metabolites of tramadol.   Lidocaine                      PRESENT      UNEXPECTED Drug Absent but Declared for Prescription Verification   Hydrocodone                    Not Detected UNEXPECTED ng/mg creat   Diclofenac                     Not Detected UNEXPECTED    Diclofenac, as indicated in the declared medication list, is not    always detected even when used as directed.   Propranolol                    Not Detected UNEXPECTED ==================================================================== Test                      Result     Flag   Units      Ref Range   Creatinine              136              mg/dL      >=20 ==================================================================== Declared Medications:  The flagging and interpretation on this report are based on the  following declared medications.  Unexpected results may arise from  inaccuracies in the declared medications.  **Note: The testing scope of this panel includes these medications:  Gabapentin  Hydrocodone (Hydrocodone-Acetaminophen)  Nortriptyline  Propranolol  Topiramate  Trazodone  **Note: The testing scope of this panel does not include small to  moderate amounts of these reported medications:  Acetaminophen (Hydrocodone-Acetaminophen)  Diclofenac  Zolpidem  **Note: The testing scope of this panel does not include following  reported medications:  Alendronate  Calcium  Cholecalciferol  Ezetimibe  Ferrous Gluconate  Magnesium  Multivitamin ==================================================================== For clinical consultation, please call [phone]. ====================================================================    UDS interpretation: Compliant          Medication Assessment Form: Reviewed. Patient indicates being compliant with therapy Treatment compliance: Compliant Risk Assessment Profile: Aberrant behavior: See initial evaluations. None observed or detected today Comorbid factors increasing risk of overdose: See initial evaluation. No additional risks detected today Opioid risk tool (ORT):  Opioid Risk  04/03/2020  Alcohol 0  Illegal Drugs 0  Rx Drugs 0  Alcohol 0  Illegal  Drugs 0  Rx Drugs 0  Age between 36-45 years  -  History of Preadolescent Sexual Abuse 0  Psychological Disease 0  Depression 0  Opioid Risk Tool Scoring 0  Opioid Risk Interpretation Low Risk    ORT Scoring interpretation table:  Score <3 = Low Risk for SUD  Score between 4-7 = Moderate Risk for SUD  Score >8 = High Risk for  Opioid Abuse   Risk of substance use disorder (SUD): Low  Risk Mitigation Strategies:  Patient Counseling: Covered Patient-Prescriber Agreement (PPA): Present and active  Notification to other healthcare providers: Done  Pharmacologic Plan: No change in therapy, at this time.             Laboratory Chemistry Profile   Renal Lab Results  Component Value Date   BUN 11 08/14/2014   CREATININE 1.10 (H) 02/25/2018   GFRAA >90 08/14/2014   GFRNONAA 89 (L) 08/14/2014     Electrolytes Lab Results  Component Value Date   NA 139 08/14/2014   K 4.0 08/14/2014   CL 99 08/14/2014   CALCIUM 10.2 08/14/2014     Hepatic No results found for: AST, ALT, ALBUMIN, ALKPHOS, AMYLASE, LIPASE, AMMONIA   ID Lab Results  Component Value Date   SARSCOV2NAA NEGATIVE 05/04/2019     Bone No results found for: [place], [id number], XB9390ZE0, PQ3300TM2, 25OHVITD1, 25OHVITD2, 25OHVITD3, TESTOFREE, TESTOSTERONE   Endocrine Lab Results  Component Value Date   GLUCOSE 94 08/14/2014     Neuropathy No results found for: VITAMINB12, FOLATE, HGBA1C, HIV   CNS No results found for: COLORCSF, APPEARCSF, RBCCOUNTCSF, WBCCSF, POLYSCSF, LYMPHSCSF, EOSCSF, PROTEINCSF, GLUCCSF, JCVIRUS, CSFOLI, IGGCSF, LABACHR, ACETBL, LABACHR, ACETBL   Inflammation (CRP: Acute  ESR: Chronic) No results found for: CRP, ESRSEDRATE, LATICACIDVEN   Rheumatology No results found for: RF, ANA, LABURIC, URICUR, LYMEIGGIGMAB, LYMEABIGMQN, HLAB27   Coagulation Lab Results  Component Value Date   PLT 254 02/25/2016     Cardiovascular Lab Results  Component Value Date   HGB 11.8 (L) 02/25/2016   HCT 36.4 02/25/2016     Screening Lab Results  Component Value Date   SARSCOV2NAA NEGATIVE 05/04/2019     Cancer No results found for: CEA, CA125, LABCA2   Allergens No results found for: ALMOND, APPLE, ASPARAGUS, AVOCADO, BANANA, BARLEY, BASIL, BAYLEAF, GREENBEAN, LIMABEAN, WHITEBEAN, BEEFIGE, REDBEET, BLUEBERRY,  BROCCOLI, CABBAGE, MELON, CARROT, CASEIN, CASHEWNUT, CAULIFLOWER, CELERY     Note: Lab results reviewed.   Recent Diagnostic Imaging Results  DG Si Joints CLINICAL DATA:  64 year old female with mid to low back pain for 2 years with no known injury.  EXAM: BILATERAL SACROILIAC JOINTS - 3+ VIEW  COMPARISON:  Lumbar MRI 12/01/2018.  FINDINGS: Right lateral L5-S1 disc space loss with endplate spurring and sclerosis. Background bone mineralization is within normal limits for age. The SI joints appears symmetric and within normal limits. Sacral ala appear intact. Grossly intact visible pelvis.  IMPRESSION: 1. SI joints appear within normal limits. 2. Chronic right side L5-S1 degeneration.  Electronically Signed   By: Genevie Ann M.D.   On: 01/02/2020 15:21 DG PAIN CLINIC C-ARM 1-60 MIN NO REPORT Fluoro was used, but no Radiologist interpretation will be provided.  Please refer to "NOTES" tab for provider progress note.  Complexity Note: Imaging results reviewed. Results shared with Ms. Caryl Pina, using Layman's terms.  Meds   Current Outpatient Medications:  .  alendronate (FOSAMAX) 70 MG tablet, TAKE 1 TABLET ONCE A WEEK IN THE AM 30 MIN BEFORE BREAKFAST WITH FULL GLASS OF WATER, Disp: 12 tablet, Rfl: 3 .  CALCIUM PO, Take by mouth daily., Disp: , Rfl:  .  Cholecalciferol (VITAMIN D-3 PO), Take by mouth daily., Disp: , Rfl:  .  ferrous gluconate (FERGON) 240 (27 FE) MG tablet, Take by mouth., Disp: , Rfl:  .  gabapentin (NEURONTIN) 300 MG capsule, Take 1 capsule (300 mg total) by mouth 3 (three) times daily., Disp: 90 capsule, Rfl: 2 .  Multiple Vitamin (MULTIVITAMIN) capsule, Take 1 capsule by mouth daily., Disp: , Rfl:  .  nortriptyline (PAMELOR) 50 MG capsule, 10 mg daily. Takes five 10 mg tabs once per day, Disp: , Rfl: 0 .  [START ON 04/06/2020] oxyCODONE-acetaminophen (PERCOCET) 5-325 MG tablet, Take 1 tablet by mouth every 8 (eight) hours as needed  for severe pain. Must last 30 days., Disp: 90 tablet, Rfl: 0 .  [START ON 05/06/2020] oxyCODONE-acetaminophen (PERCOCET) 5-325 MG tablet, Take 1 tablet by mouth every 8 (eight) hours as needed for severe pain. Must last 30 days., Disp: 90 tablet, Rfl: 0 .  tiZANidine (ZANAFLEX) 4 MG tablet, Take 1 tablet (4 mg total) by mouth 2 (two) times daily as needed for muscle spasms., Disp: 60 tablet, Rfl: 2 .  topiramate (TOPAMAX) 100 MG tablet, Take 100 mg by mouth daily., Disp: , Rfl:  .  traZODone (DESYREL) 100 MG tablet, TAKE 1 TABLET BY MOUTH AT BEDTIME AS NEEDED, Disp: 30 tablet, Rfl: 5 .  zolpidem (AMBIEN) 10 MG tablet, TAKE 1/2 TO 1 TABLET BY MOUTH EVERY DAY AT BEDTIME AS NEEDED, Disp: 30 tablet, Rfl: 2 .  busPIRone (BUSPAR) 15 MG tablet, Take 15 mg by mouth 2 (two) times daily., Disp: , Rfl:  .  FLECTOR 1.3 % PTCH, PLACE 1 PATCH ONTO THE SKIN 2 (TWO) TIMES DAILY. APPLY PATCH TO THE MOST PAINFUL AREA., Disp: , Rfl: 11 .  topiramate (TOPAMAX) 25 MG capsule, Take 100 mg by mouth daily. , Disp: , Rfl:   ROS  Constitutional: Denies any fever or chills Gastrointestinal: No reported hemesis, hematochezia, vomiting, or acute GI distress Musculoskeletal: Denies any acute onset joint swelling, redness, loss of ROM, or weakness Neurological: No reported episodes of acute onset apraxia, aphasia, dysarthria, agnosia, amnesia, paralysis, loss of coordination, or loss of consciousness  Allergies  Ms. Raczkowski is allergic to elastic bandages & [zinc]; codeine; and morphine.  [place]  Drug: Ms. Kruczek  reports no history of drug use. Alcohol:  reports no history of alcohol use. Tobacco:  reports that she has never smoked. She has never used smokeless tobacco. Medical:  has a past medical history of Headache, History of fractured vertebra, Hyperlipidemia, Low bone density, and Transfusion of blood product refused for religious reason. Surgical: Ms. Leech  has a past surgical history that includes Abdominal  hysterectomy; Colonoscopy (2000); Eye surgery (Left, 2014); and Artery Biopsy (Left, 12/18/2015). Family: family history includes Dementia in her mother; Heart attack in her father.  Constitutional Exam  General appearance: Well nourished, well developed, and well hydrated. In no apparent acute distress Vitals:   04/03/20 0816  BP: (!) 142/71  Pulse: 95  Resp: 18  Temp: (!) 97.2 F (36.2 C)  SpO2: 99%  Weight: 140 lb (63.5 kg)  Height: '5\' 6"'  (1.676 m)   BMI Assessment: Estimated body mass index is 22.6 kg/m as calculated  from the following:   Height as of this encounter: '5\' 6"'  (1.676 m).   Weight as of this encounter: 140 lb (63.5 kg).  BMI interpretation table: BMI level Category Range association with higher incidence of chronic pain  <18 kg/m2 Underweight   18.5-24.9 kg/m2 Ideal body weight   25-29.9 kg/m2 Overweight Increased incidence by 20%  30-34.9 kg/m2 Obese (Class I) Increased incidence by 68%  35-39.9 kg/m2 Severe obesity (Class II) Increased incidence by 136%  >40 kg/m2 Extreme obesity (Class III) Increased incidence by 254%   Patient's current BMI Ideal Body weight  Body mass index is 22.6 kg/m. Ideal body weight: 59.3 kg (130 lb 11.7 oz) Adjusted ideal body weight: 61 kg (134 lb 7 oz)   BMI Readings from Last 4 Encounters:  04/03/20 22.60 kg/m  01/02/20 21.79 kg/m  06/29/19 21.79 kg/m  06/15/19 21.79 kg/m   Wt Readings from Last 4 Encounters:  04/03/20 140 lb (63.5 kg)  01/02/20 135 lb (61.2 kg)  06/29/19 135 lb (61.2 kg)  06/15/19 135 lb (61.2 kg)    Psych/Mental status: Alert, oriented x 3 (person, place, & time)       Eyes: PERLA Respiratory: No evidence of acute respiratory distress  Cervical Spine Exam  Skin & Axial Inspection: No masses, redness, edema, swelling, or associated skin lesions Alignment: Symmetrical Functional ROM: Unrestricted ROM      Stability: No instability detected Muscle Tone/Strength: Functionally intact. No obvious  neuro-muscular anomalies detected. Sensory (Neurological): Unimpaired Palpation: No palpable anomalies              Upper Extremity (UE) Exam    Side: Right upper extremity  Side: Left upper extremity  Skin & Extremity Inspection: Skin color, temperature, and hair growth are WNL. No peripheral edema or cyanosis. No masses, redness, swelling, asymmetry, or associated skin lesions. No contractures.  Skin & Extremity Inspection: Skin color, temperature, and hair growth are WNL. No peripheral edema or cyanosis. No masses, redness, swelling, asymmetry, or associated skin lesions. No contractures.  Functional ROM: Unrestricted ROM          Functional ROM: Decreased ROM for shoulder  Muscle Tone/Strength: Functionally intact. No obvious neuro-muscular anomalies detected.  Muscle Tone/Strength: Functionally intact. No obvious neuro-muscular anomalies detected.  Sensory (Neurological): Unimpaired          Sensory (Neurological): Musculoskeletal pain pattern affecting the shoulder  Palpation: No palpable anomalies              Palpation: No palpable anomalies              Provocative Test(s):  Phalen's test: deferred Tinel's test: deferred Apley's scratch test (touch opposite shoulder):  Action 1 (Across chest): deferred Action 2 (Overhead): deferred Action 3 (LB reach): deferred   Provocative Test(s):  Phalen's test: deferred Tinel's test: deferred Apley's scratch test (touch opposite shoulder):  Action 1 (Across chest): Decreased ROM Action 2 (Overhead): Decreased ROM Action 3 (LB reach): Decreased ROM    Thoracic Spine Area Exam  Skin & Axial Inspection: No masses, redness, or swelling Alignment: Symmetrical Functional ROM: Unrestricted ROM Stability: No instability detected Muscle Tone/Strength: Functionally intact. No obvious neuro-muscular anomalies detected. Sensory (Neurological): Unimpaired Muscle strength & Tone: No palpable anomalies  Lumbar Exam  Skin & Axial Inspection: No  masses, redness, or swelling Alignment: Symmetrical Functional ROM: Decreased ROM affecting both sides Stability: No instability detected Muscle Tone/Strength: Functionally intact. No obvious neuro-muscular anomalies detected. Sensory (Neurological): Musculoskeletal pain pattern Palpation: No palpable anomalies  Provocative Tests: Hyperextension/rotation test: (+) bilaterally for facet joint pain. Lumbar quadrant test (Kemp's test): (+) bilaterally for facet joint pain. Lateral bending test: (+) due to pain. Patrick's Maneuver: deferred today                   FABER* test: deferred today                   S-I anterior distraction/compression test: deferred today         S-I lateral compression test: deferred today         S-I Thigh-thrust test: deferred today         S-I Gaenslen's test: deferred today         *(Flexion, ABduction and External Rotation)  Gait & Posture Assessment  Ambulation: Unassisted Gait: Relatively normal for age and body habitus Posture: WNL   Lower Extremity Exam    Side: Right lower extremity  Side: Left lower extremity  Stability: No instability observed          Stability: No instability observed          Skin & Extremity Inspection: Skin color, temperature, and hair growth are WNL. No peripheral edema or cyanosis. No masses, redness, swelling, asymmetry, or associated skin lesions. No contractures.  Skin & Extremity Inspection: Skin color, temperature, and hair growth are WNL. No peripheral edema or cyanosis. No masses, redness, swelling, asymmetry, or associated skin lesions. No contractures.  Functional ROM: Unrestricted ROM                  Functional ROM: Unrestricted ROM                  Muscle Tone/Strength: Functionally intact. No obvious neuro-muscular anomalies detected.  Muscle Tone/Strength: Functionally intact. No obvious neuro-muscular anomalies detected.  Sensory (Neurological): Unimpaired        Sensory (Neurological): Unimpaired         DTR: Patellar: deferred today Achilles: deferred today Plantar: deferred today  DTR: Patellar: deferred today Achilles: deferred today Plantar: deferred today  Palpation: No palpable anomalies  Palpation: No palpable anomalies   Assessment   Status Diagnosis  Controlled Controlled Controlled 1. Lumbar spondylosis   2. Lumbar facet arthropathy (L3/4 and L4/5)   3. Degeneration of lumbar or lumbosacral intervertebral disc   4. Lumbar radiculopathy (right L5)   5. Chronic pain syndrome   6. Chronic left shoulder pain   7. Dysfunction of left rotator cuff      Updated Problems: Problem  Lumbar Spondylosis   1.  Refill gabapentin, tizanidine, oxycodone as below. 2.  Annual urine toxicology screen for medication compliance monitoring 3.  Plan for Sprint peripheral nerve stimulation of lumbar medial branch for lumbar facet arthropathy, chronic low back pain that has not been responsive to lumbar facet medial branch radiofrequency ablation 4.  For left shoulder pain that has gotten worse over time, recommend MRI of left shoulder.  Plan of Care  Pharmacotherapy (Medications Ordered): Meds ordered this encounter  Medications  . gabapentin (NEURONTIN) 300 MG capsule    Sig: Take 1 capsule (300 mg total) by mouth 3 (three) times daily.    Dispense:  90 capsule    Refill:  2  . oxyCODONE-acetaminophen (PERCOCET) 5-325 MG tablet    Sig: Take 1 tablet by mouth every 8 (eight) hours as needed for severe pain. Must last 30 days.    Dispense:  90 tablet    Refill:  0  Chronic Pain. (STOP Act - Not applicable). Fill one day early if closed on scheduled refill date.  Marland Kitchen tiZANidine (ZANAFLEX) 4 MG tablet    Sig: Take 1 tablet (4 mg total) by mouth 2 (two) times daily as needed for muscle spasms.    Dispense:  60 tablet    Refill:  2  . oxyCODONE-acetaminophen (PERCOCET) 5-325 MG tablet    Sig: Take 1 tablet by mouth every 8 (eight) hours as needed for severe pain. Must last 30 days.     Dispense:  90 tablet    Refill:  0    Chronic Pain. (STOP Act - Not applicable). Fill one day early if closed on scheduled refill date.   Medications administered today: Teresa [name] had no medications administered during this visit.  Orders:  Orders Placed This Encounter  Procedures  . Misc procedure    Standing Status:   Future    Standing Expiration Date:   04/03/2021    Scheduling Instructions:     Lumbar medial Branch sprint peripheral nerve stimulation at L4-L5 with sedation  . MR SHOULDER LEFT WO CONTRAST    Standing Status:   Future    Standing Expiration Date:   10/03/2020    Order Specific Question:   What is the patient's sedation requirement?    Answer:   No Sedation    Order Specific Question:   Does the patient have a pacemaker or implanted devices?    Answer:   No    Order Specific Question:   Preferred imaging location?    Answer:   [facility] (table limit - [phone])    Order Specific Question:   Radiology Contrast Protocol - do NOT remove file path    Answer:   [email or web address]  . ToxASSURE Select 13 (MW), Urine    Volume: 30 ml(s). Minimum 3 ml of urine is needed. Document temperature of fresh sample. Indications: Long term (current) use of opiate analgesic (Z79.891)    Lab Orders     ToxASSURE Select 13 (MW), Urine  Imaging Orders     MR SHOULDER LEFT WO CONTRAST Planned follow-up:   Return in about 4 weeks (around 05/01/2020) for Sprint PNS MBB, with sedation.     Status post left L3, L4, L5, S1 RFA on 06/15/2019, R L3,4,5, S1 RFA on 06/29/2019, left posterior glenohumeral shoulder steroid injection 01/02/2020: Not effective       Recent Visits Date Type Provider Dept  02/07/20 Office Visit Gillis Santa, MD [facility]  Showing recent visits within past 90 days and meeting all other requirements   Today's Visits Date Type Provider Dept  04/03/20 Office Visit Gillis Santa, MD [facility]   Showing today's visits and meeting all other requirements   Future Appointments No visits were found meeting these conditions.  Showing future appointments within next 90 days and meeting all other requirements   Primary Care Physician: Tracie Harrier, MD Location: [facility] Note by: Gillis Santa, MD Date: 04/03/2020; Time: 8:47 AM  Note: This dictation was prepared with Dragon dictation. Any transcriptional errors that may result from this process are unintentional.

## 2020-04-03 NOTE — Patient Instructions (Signed)
Post-procedure Information What to expect: Most procedures involve the use of a local anesthetic (numbing medicine), and a steroid (anti-inflammatory medicine).  The local anesthetics may cause temporary numbness and weakness of the legs or arms, depending on the location of the block. This numbness/weakness may last 4-6 hours, depending on the local anesthetic used. In rare instances, it can last up to 24 hours. While numb, you must be very careful not to injure the extremity.  After any procedure, you could expect the pain to get better within 15-20 minutes. This relief is temporary and may last 4-6 hours. Once the local anesthetics wears off, you could experience discomfort, possibly more than usual, for up to 10 (ten) days. In the case of radiofrequencies, it may last up to 6 weeks. Surgeries may take up to 8 weeks for the healing process. The discomfort is due to the irritation caused by needles going through skin and muscle. To minimize the discomfort, we recommend using ice the first day, and heat from then on. The ice should be applied for 15 minutes on, and 15 minutes off. Keep repeating this cycle until bedtime. Avoid applying the ice directly to the skin, to prevent frostbite. Heat should be used daily, until the pain improves (4-10 days). Be careful not to burn yourself.  Occasionally you may experience muscle spasms or cramps. These occur as a consequence of the irritation caused by the needle sticks to the muscle and the blood that will inevitably be lost into the surrounding muscle tissue. Blood tends to be very irritating to tissues, which tend to react by going into spasm. These spasms may start the same day of your procedure, but they may also take days to develop. This late onset type of spasm or cramp is usually caused by electrolyte imbalances triggered by the steroids, at the level of the kidney. Cramps and spasms tend to respond well to muscle relaxants, multivitamins (some are  triggered by the procedure, but may have their origins in vitamin deficiencies), and "Gatorade", or any sports drinks that can replenish any electrolyte imbalances. (If you are a diabetic, ask your pharmacist to get you a sugar-free brand.) Warm showers or baths may also be helpful. Stretching exercises are highly recommended. General Instructions:  Be alert for signs of possible infection: redness, swelling, heat, red streaks, elevated temperature, and/or fever. These typically appear 4 to 6 days after the procedure. Immediately notify your doctor if you experience unusual bleeding, difficulty breathing, or loss of bowel or bladder control. If you experience increased pain, do not increase your pain medicine intake, unless instructed by your pain physician. Post-Procedure Care:  Be careful in moving about. Muscle spasms in the area of the injection may occur. Applying ice or heat to the area is often helpful. The incidence of spinal headaches after epidural injections ranges between 1.4% and 6%. If you develop a headache that does not seem to respond to conservative therapy, please let your physician know. This can be treated with an epidural blood patch.   Post-procedure numbness or redness is to be expected, however it should average 4 to 6 hours. If numbness and weakness of your extremities begins to develop 4 to 6 hours after your procedure, and is felt to be progressing and worsening, immediately contact your physician.   Diet:  If you experience nausea, do not eat until this sensation goes away. If you had a "Stellate Ganglion Block" for upper extremity "Reflex Sympathetic Dystrophy", do not eat or drink until your   hoarseness goes away. In any case, always start with liquids first and if you tolerate them well, then slowly progress to more solid foods. Activity:  For the first 4 to 6 hours after the procedure, use caution in moving about as you may experience numbness and/or weakness. Use caution in  cooking, using household electrical appliances, and climbing steps. If you need to reach your Doctor call our office: [phone] Monday-Thursday 8:00 am - 4:00 PM    Fridays: Closed     In case of an emergency: In case of emergency, call 911 or go to the nearest emergency room and have the physician there call us.  Interpretation of Procedure Every nerve block has two components: a diagnostic component, and a treatment component. Unrealistic expectations are the most common causes of "perceived failure".  In a perfect world, a single nerve block should be able to completely and permanently eliminate the pain. Sadly, the world is not perfect.  Most pain management nerve blocks are performed using local anesthetics and steroids. Steroids are responsible for any long-term benefit that you may experience. Their purpose is to decrease any chronic swelling that may exist in the area. Steroids begin to work immediately after being injected. However, most patients will not experience any benefits until 5 to 10 days after the injection, when the swelling has come down to the point where they can tell a difference. Steroids will only help if there is swelling to be treated. As such, they can assist with the diagnosis. If effective, they suggest an inflammatory component to the pain, and if ineffective, they rule out inflammation as the main cause or component of the problem. If the problem is one of mechanical compression, you will get no benefit from those steroids.   In the case of local anesthetics, they have a crucial role in the diagnosis of your condition. Most will begin to work within15 to 20 minutes after injection. The duration will depend on the type used (short- vs. Long-acting). It is of outmost importance that patients keep tract of their pain, after the procedure. To assist with this matter, a "Post-procedure Pain Diary" is provided. Make sure to complete it and to bring it back to your  follow-up appointment.  As long as the patient keeps accurate, detailed records of their symptoms after every procedure, and returns to have those interpreted, every procedure will provide us with invaluable information. Even a block that does not provide the patient with any relief, will always provide us with information about the mechanism and the origin of the pain. The only time a nerve block can be considered a waste of time is when patients do not keep track of the results, or do not keep their post-procedure appointment.  Reporting the results back to your physician The Pain Score  Pain is a subjective complaint. It cannot be seen, touched, or measured. We depend entirely on the patient's report of the pain in order to assess your condition and treatment. To evaluate the pain, we use a pain scale, where "0" means "No Pain", and a "10" is "the worst possible pain that you can even imagine" (i.e. something like been eaten alive by a shark or being torn apart by a lion).   You will frequently be asked to rate your pain. Please be as accurate, remember that medical decisions will be based on your responses. Please do not rate your pain above a 10. Doing so is actually interpreted as "symptom magnification" (exaggeration), as   well as lack of understanding with regards to the scale. To put this into perspective, when you tell us that your pain is at a 10 (ten), what you are saying is that there is nothing we can do to make this pain any worse. (Carefully think about that.) Preparing for Procedure with Sedation Instructions: . Oral Intake: Do not eat or drink anything for at least 8 hours prior to your procedure. . Transportation: Public transportation is not allowed. Bring an adult driver. The driver must be physically present in our waiting room before any procedure can be started. . Physical Assistance: Bring an adult capable of physically assisting you, in the event you need help. . Blood Pressure  Medicine: Take your blood pressure medicine with a sip of water the morning of the procedure. . Insulin: Take only  of your normal insulin dose. . Preventing infections: Shower with an antibacterial soap the morning of your procedure. . Build-up your immune system: Take 1000 mg of Vitamin C with every meal (3 times a day) the day prior to your procedure. . Pregnancy: If you are pregnant, call and cancel the procedure. . Sickness: If you have a cold, fever, or any active infections, call and cancel the procedure. . Arrival: You must be in the facility at least 30 minutes prior to your scheduled procedure. . Children: Do not bring children with you. . Dress appropriately: Bring dark clothing that you would not mind if they get stained. . Valuables: Do not bring any jewelry or valuables. Procedure appointments are reserved for interventional treatments only. . No Prescription Refills. . No medication changes will be discussed during procedure appointments. . No disability issues will be discussed. Preparing for your procedure (without sedation) Instructions: Oral Intake: Do not eat or drink anything for at least 3 hours prior to your procedure. Transportation: Unless otherwise stated by your physician, you may drive yourself after the procedure. Blood Pressure Medicine: Take your blood pressure medicine with a sip of water the morning of the procedure. Insulin: Take only  of your normal insulin dose. Preventing infections: Shower with an antibacterial soap the morning of your procedure. Build-up your immune system: Take 1000 mg of Vitamin C with every meal (3 times a day) the day prior to your procedure. Pregnancy: If you are pregnant, call and cancel the procedure. Sickness: If you have a cold, fever, or any active infections, call and cancel the procedure. Arrival: You must be in the facility at least 30 minutes prior to your scheduled procedure. Children: Do not bring any children with  you. Dress appropriately: Bring dark clothing that you would not mind if they get stained. Valuables: Do not bring any jewelry or valuables. Procedure appointments are reserved for interventional treatments only. No Prescription Refills. No medication changes will be discussed during procedure appointments. No disability issues will be discussed.  

## 2020-04-06 LAB — TOXASSURE SELECT 13 (MW), URINE

## 2020-04-17 ENCOUNTER — Other Ambulatory Visit: Payer: Self-pay

## 2020-04-17 ENCOUNTER — Ambulatory Visit
Admission: RE | Admit: 2020-04-17 | Discharge: 2020-04-17 | Disposition: A | Discharge status: Home / Self Care | Payer: Medicare Other | Source: Ambulatory Visit | Attending: Student in an Organized Health Care Education/Training Program | Admitting: Student in an Organized Health Care Education/Training Program

## 2020-04-17 DIAGNOSIS — M25512 Pain in left shoulder: Secondary | ICD-10-CM | POA: Insufficient documentation

## 2020-04-17 DIAGNOSIS — M67912 Unspecified disorder of synovium and tendon, left shoulder: Secondary | ICD-10-CM | POA: Insufficient documentation

## 2020-04-17 DIAGNOSIS — G8929 Other chronic pain: Secondary | ICD-10-CM | POA: Diagnosis present

## 2020-04-23 ENCOUNTER — Ambulatory Visit: Payer: Medicare Other | Admitting: Student in an Organized Health Care Education/Training Program

## 2020-04-25 ENCOUNTER — Ambulatory Visit
Admission: RE | Admit: 2020-04-25 | Discharge: 2020-04-25 | Disposition: A | Discharge status: Home / Self Care | Payer: Medicare Other | Source: Ambulatory Visit | Attending: Student in an Organized Health Care Education/Training Program | Admitting: Student in an Organized Health Care Education/Training Program

## 2020-04-25 ENCOUNTER — Other Ambulatory Visit: Payer: Self-pay | Admitting: Student in an Organized Health Care Education/Training Program

## 2020-04-25 ENCOUNTER — Ambulatory Visit (HOSPITAL_BASED_OUTPATIENT_CLINIC_OR_DEPARTMENT_OTHER): Payer: Medicare Other | Admitting: Student in an Organized Health Care Education/Training Program

## 2020-04-25 ENCOUNTER — Other Ambulatory Visit: Payer: Self-pay

## 2020-04-25 ENCOUNTER — Encounter: Payer: Self-pay | Admitting: Student in an Organized Health Care Education/Training Program

## 2020-04-25 VITALS — BP 128/74 | HR 94 | Temp 97.2°F | Resp 16 | Ht 66.0 in | Wt 140.0 lb

## 2020-04-25 DIAGNOSIS — G894 Chronic pain syndrome: Secondary | ICD-10-CM | POA: Insufficient documentation

## 2020-04-25 DIAGNOSIS — M67912 Unspecified disorder of synovium and tendon, left shoulder: Secondary | ICD-10-CM | POA: Diagnosis present

## 2020-04-25 DIAGNOSIS — R52 Pain, unspecified: Secondary | ICD-10-CM | POA: Diagnosis present

## 2020-04-25 DIAGNOSIS — M47816 Spondylosis without myelopathy or radiculopathy, lumbar region: Secondary | ICD-10-CM | POA: Diagnosis not present

## 2020-04-25 DIAGNOSIS — M5137 Other intervertebral disc degeneration, lumbosacral region: Secondary | ICD-10-CM

## 2020-04-25 MED ORDER — LIDOCAINE HCL 2 % IJ SOLN
20.0000 mL | Freq: Once | INTRAMUSCULAR | Status: AC
  Administered 2020-04-25: 400 mg

## 2020-04-25 MED ORDER — DIAZEPAM 5 MG PO TABS
10.0000 mg | ORAL_TABLET | Freq: Once | ORAL | Status: AC
  Administered 2020-04-25: 10 mg via ORAL

## 2020-04-25 MED ORDER — LIDOCAINE HCL 2 % IJ SOLN
INTRAMUSCULAR | Status: AC
  Filled 2020-04-25: qty 20

## 2020-04-25 MED ORDER — DIAZEPAM 5 MG PO TABS
ORAL_TABLET | ORAL | Status: AC
  Filled 2020-04-25: qty 2

## 2020-04-25 NOTE — Progress Notes (Signed)
Safety precautions to be maintained throughout the outpatient stay will include: orient to surroundings, keep bed in low position, maintain call bell within reach at all times, provide assistance with transfer out of bed and ambulation.  

## 2020-04-25 NOTE — Progress Notes (Signed)
PROVIDER NOTE: Information contained herein reflects review and annotations entered in association with encounter. Interpretation of such information and data should be left to medically-trained personnel. Information provided to patient can be located elsewhere in the medical record under "Patient Instructions". Document created using STT-dictation technology, any transcriptional errors that may result from process are unintentional.    Patient: Teresa [name]  Service Category: Procedure  Provider: Gillis Santa, MD  DOB: [birth year]  DOS: 04/25/2020  Location: [facility]  MRN: [id number]  Setting: Ambulatory - outpatient  Referring Provider: Tracie Harrier, MD  Type: Established Patient  Specialty: Interventional Pain Management  PCP: Tracie Harrier, MD    Procedure:  Anesthesia, Analgesia, Anxiolysis:  Type: Sprint peripheral nerve stimulation: Lumbar medial branch.  Region: Lumbar Level: L4 Laterality: Left-Sided   Type: Moderate (Conscious) Sedation combined with Local Anesthesia (10 mg PO Valium) Indication(s): Analgesia and Anxiety Route: IM and PO IV Access: Secured Sedation: Meaningful verbal contact was maintained at all times during the procedure  Local Anesthetic: Lidocaine 1-2%   Indications: 1. Lumbar spondylosis   2. Lumbar facet arthropathy (L3/4 and L4/5)   3. Degeneration of lumbar or lumbosacral intervertebral disc   4. Chronic pain syndrome   5. Dysfunction of left rotator cuff    Pain Score: Pre-procedure: 4 /10 Post-procedure: 2 /10   Pre-op Assessment:  Teresa [name] is a 64 y.o. (year old), female patient, seen today for interventional treatment. She  has a past surgical history that includes Abdominal hysterectomy; Colonoscopy (2000); Eye surgery (Left, 2014); and Artery Biopsy (Left, 12/18/2015).  Initial Vital Signs:  Pulse/EKG Rate: 94ECG Heart Rate: 82 Temp: (!) 97.2 F (36.2 C) Resp: 18 BP: 132/70 SpO2: 100 %  BMI: Estimated  body mass index is 22.6 kg/m as calculated from the following:   Height as of this encounter: 5\' 6"  (1.676 m).   Weight as of this encounter: 140 lb (63.5 kg).  Risk Assessment: Allergies: Reviewed. She is allergic to elastic bandages & [zinc], codeine, and morphine.  Allergy Precautions: None required Coagulopathies: Reviewed. None identified.  Blood-thinner therapy: None at this time Active Infection(s): Reviewed. None identified. Teresa [name] is afebrile  Site Confirmation: Teresa [name] was asked to confirm the procedure and laterality before marking the site, which she did.  Procedure checklist: Completed Consent: Before the procedure and under the influence of no sedative(s), amnesic(s), or anxiolytics, the patient was informed of the treatment options, risks and possible complications. To fulfill our ethical and legal obligations, as recommended by the American Medical Association's Code of Ethics, I have informed the patient of my clinical impression; the nature and purpose of the treatment or procedure; the risks, benefits, and possible complications of the intervention; the alternatives, including doing nothing; the risk(s) and benefit(s) of the alternative treatment(s) or procedure(s); and the risk(s) and benefit(s) of doing nothing.  Teresa [name] was provided with information about the general risks and possible complications associated with most interventional procedures. These include, but are not limited to: failure to achieve desired goals, infection, bleeding, organ or nerve damage, allergic reactions, paralysis, and/or death.  In addition, she was informed of those risks and possible complications associated to this particular procedure, which include, but are not limited to: damage to the implant; failure to decrease pain; local, systemic, or serious CNS infections, intraspinal abscess with possible cord compression and paralysis, or life-threatening such as meningitis; intrathecal  and/or epidural bleeding with formation of hematoma with possible spinal cord compression and permanent paralysis; organ  damage; nerve injury or damage with subsequent sensory, motor, and/or autonomic system dysfunction, resulting in transient or permanent pain, numbness, and/or weakness of one or several areas of the body; allergic reactions, either minor or major life-threatening, such as anaphylactic or anaphylactoid reactions.  Furthermore, Teresa [name] was informed of those risks and complications associated with the medications. These include, but are not limited to: allergic reactions (i.e.: anaphylactic or anaphylactoid reactions); arrhythmia;  Hypotension/hypertension; cardiovascular collapse; respiratory depression and/or shortness of breath; swelling or edema; medication-induced neural toxicity; particulate matter embolism and blood vessel occlusion with resultant organ, and/or nervous system infarction and permanent paralysis.  Finally, she was informed that Medicine is not an exact science; therefore, there is also the possibility of unforeseen or unpredictable risks and/or possible complications that may result in a catastrophic outcome. The patient indicated having understood very clearly. We have given the patient no guarantees and we have made no promises. Enough time was given to the patient to ask questions, all of which were answered to the patient's satisfaction. Teresa [name] has indicated that she wanted to continue with the procedure. Attestation: I, the ordering provider, attest that I have discussed with the patient the benefits, risks, side-effects, alternatives, likelihood of achieving goals, and potential problems during recovery for the procedure that I have provided informed consent. Date  Time: 04/25/2020 10:51 AM  Pre-Procedure Preparation:  Monitoring: As per clinic protocol. Respiration, ETCO2, SpO2, BP, heart rate and rhythm monitor placed and checked for adequate  function Safety Precautions: Patient was assessed for positional comfort and pressure points before starting the procedure. Time-out: I initiated and conducted the "Time-out" before starting the procedure, as per protocol. The patient was asked to participate by confirming the accuracy of the "Time Out" information. Verification of the correct person, site, and procedure were performed and confirmed by me, the nursing staff, and the patient. "Time-out" conducted as per Joint Commission's Universal Protocol (UP.01.01.01). Time: 1127  Description of Procedure Process:   Position: Prone Target Area: Lamina, inferior and medial to facet joint Approach: Posterior percutaneous, paramedial Area Prepped:Lumbar region Prepping solution: ChloraPrep (2% chlorhexidine gluconate and 70% isopropyl alcohol) Safety Precautions: Safe injection practices and needle disposal techniques used. Medications properly checked for expiration dates. SDV (single dose vial) medications used. Aspiration looking for blood return and/or CSF was conducted prior to all injections. At no point did I inject any substances, as a needle was being advanced. No attempts were made at seeking any paresthesias.  After the risks, benefits and alternatives were discussed  with the patient and informed consent was obtained, patient  was placed in the prone position and padded to foster comfort.  Prior to delivery of anesthetics, the painful region was carefully  outlined with a marker. Appropriate skin and bony landmarks  were identified, and pertinent vascular structures were located.   The skin overlying the lumbosacral spine was prepped and  draped in sterile fashion. Fluoroscopy was used to identify the spinous process and lamina in the center of the  patient's region of pain. After identifying and marking the intended target along the course of the medial branch nerve,  the skin around the planned entry point and the subcutaneous tissues are  injected with local anesthetic.  An introducer needle and stimulating probe were  assembled, inserted and advanced along the intended course of the medial branch nerve as it traverses the lamina medial and inferior to the zygapophyseal joint, taking care to maintain the proper depth of insertion as the introducer  is advanced under fluoroscopic. The introducer needle was delivered to a location in proximity to the nerve.  Multiple stimulation parameters were used to deliver stimulation to the medial branch nerve in concert with stimulating at multiple positions around the nerve. Nerve target acquisition was confirmed noting generation of paresthesias in the paravertebral regions corresponding to the level being stimulated as well as rhythmic thumping within the multifidi, the  latter being further corroborated via palpation.   Various electrical parameter combinations were tested, and the lead location was adjusted (physically relocated) until the patient  indicated paresthesia or muscle tension overlapping the distribution of the patient's typical region of pain.   The stimulating probe was removed from the introducer and a percutaneous lead was guided through the needle and delivered to a location in similar proximity to the nerve. Final location was verified with electrical stimulation and documented with fluoroscopy & ultrasound.   The introducer needle was removed, and the exposed [name] of the percutaneous lead was attached to an external stimulator unit. Various electrical parameter combinations were again tested until the patient indicated paresthesia or muscle tension  overlapping the distribution of the patient's typical region of pain.   After confirming that lead impedance was in the normal range, the external unit was detached, the needle was removed, and the lead was anchored at the skin.  The lead was threaded into the connector block and electrical continuity and desired patient response  was confirmed. The connector block was attached to the external stimulator unit.The site was covered with a sterile occlusive dressing and  a fluoroscopic and ultrasound image was taken to document final placement. The patient was observed for stability of vital signs and comfort.    Vitals:   04/25/20 1152 04/25/20 1200 04/25/20 1210 04/25/20 1218  BP: (!) 122/57 112/62 133/74 128/74  Pulse:      Resp: 16 15 16 16   Temp:      SpO2: 100% 99% 100% 99%  Weight:      Height:       Start Time: 1127 hrs. [name] Time: 1135 hrs.   Imaging Guidance (Spinal):          Type of Imaging Technique: Fluoroscopy Guidance (Spinal) and Ultrasound guidance Indication(s): Assistance in needle guidance and placement for procedures requiring needle placement in or near specific anatomical locations not easily accessible without such assistance. Exposure Time: Please see nurses notes. Contrast: None used. Fluoroscopic Guidance: I was personally present during the use of fluoroscopy. "Tunnel Vision Technique" used to obtain the best possible view of the target area. Parallax error corrected before commencing the procedure. "Direction-depth-direction" technique used to introduce the needle under continuous pulsed fluoroscopy. Once target was reached, antero-posterior, oblique, and lateral fluoroscopic projection used confirm needle placement in all planes. Images permanently stored in EMR. Interpretation: No contrast injected. I personally interpreted the imaging intraoperatively. Adequate needle placement confirmed in multiple planes. Permanent images saved into the patient's record. Ultrasound guidance used to confirm contraction of multifidus muscle after final placement. Antibiotic Prophylaxis:   Anti-infectives (From admission, onward)   None     Indication(s): None identified  Post-operative Assessment:  Post-procedure Vital Signs:  Pulse/HCG Rate: 9482 Temp: (!) 97.2 F (36.2 C) Resp: 16 BP:  128/74 SpO2: 99 %  Complications: No immediate post-treatment complications observed by team, or reported by patient.  Note: The patient tolerated the entire procedure well. A repeat set of vitals were taken after the procedure and the patient was kept under observation following  institutional policy, for this type of procedure. Post-procedural neurological assessment was performed, showing return to baseline, prior to discharge. The patient was provided with post-procedure discharge instructions, including a section on how to identify potential problems. Should any problems arise concerning this procedure, the patient was given instructions to immediately contact us, at any time, without hesitation. In any case, we plan to contact the patient by telephone for a follow-up status report regarding this interventional procedure.  Comments:  No additional relevant information.  Plan of Care  Orders:  Orders Placed This Encounter  Procedures  . Peripheral Nerve Stimulation    Standing Status:   Future    Standing Expiration Date:   04/25/2021    Scheduling Instructions:     RIGHT L4 medial branch PNS in 3 weeks    Order Specific Question:   Where will this procedure be performed?    Answer:   [facility]  . Ambulatory referral to Physical Therapy    Referral Priority:   Routine    Referral Type:   Physical Medicine    Referral Reason:   Specialty Services Required    Requested Specialty:   Physical Therapy    Number of Visits Requested:   1    Medications administered: We administered diazepam and lidocaine.  See the medical record for exact dosing, route, and time of administration.  Follow-up plan:   Return in about 3 weeks (around 05/16/2020) for R L4 PNS .      Status post left L3, L4, L5, S1 RFA on 06/15/2019, R L3,4,5, S1 RFA on 06/29/2019, left posterior glenohumeral shoulder steroid injection 01/02/2020: Not effective.  Left L4 and Sprint peripheral nerve stimulation on  04/25/2020, follow-up in 3 weeks for right side        Recent Visits Date Type Provider Dept  04/03/20 Office Visit Gillis Santa, MD [facility]  02/07/20 Office Visit Gillis Santa, MD [facility]  Showing recent visits within past 90 days and meeting all other requirements Today's Visits Date Type Provider Dept  04/25/20 Procedure visit Gillis Santa, MD [facility]  Showing today's visits and meeting all other requirements Future Appointments Date Type Provider Dept  07/03/20 Appointment Gillis Santa, MD [facility]  Showing future appointments within next 90 days and meeting all other requirements  Disposition: Discharge home  Discharge (Date  Time): 04/25/2020; 1220 hrs.   Primary Care Physician: Tracie Harrier, MD Location: [facility] Note by: Gillis Santa, MD Date: 04/25/2020; Time: 12:56 PM

## 2020-04-25 NOTE — Patient Instructions (Signed)

## 2020-05-02 ENCOUNTER — Ambulatory Visit: Payer: Medicare Other | Admitting: Student in an Organized Health Care Education/Training Program

## 2020-05-02 ENCOUNTER — Telehealth: Payer: Self-pay | Admitting: Student in an Organized Health Care Education/Training Program

## 2020-05-02 NOTE — Telephone Encounter (Signed)
Pt called stating that she is having stiffness and pain at the site of her PNS. She talked to the rep and she told her to stop using the device for today and see how it went. Pt would like Dr Holley Raring to know this and she also wants to know when she is supposed to come back for this bc she doesn't have an appt.

## 2020-05-02 NOTE — Telephone Encounter (Signed)
Pt has called again stating the pain is worse and would like someone to call her back asap.

## 2020-05-02 NOTE — Telephone Encounter (Signed)
Spoke with Dr Holley Raring and he has recommended that she leave the stimulator off for 24 hours and see how she resonds.  If the pain is no better she will need to come in tomorrow morning for possible d/c of stimulator.    Talked to patient to convey information.  She is agreeable to wait until the morning with stiimulator off.  She is going to try ice vs heat to see if this helps, she is also going to take prescribed muscle relaxer as well as an anti inflammatory.  She will call in the morning to let us know if she needs to come in.

## 2020-05-02 NOTE — Telephone Encounter (Signed)
Patient is c/o stiffness in her back and feeling that she can not bend over.  States she has had discomfort at the site since the procedure.  Has been in communication with the representative and she has asked her to turn it off today, she did so this morning at 0900.  She has not experienced any relief.  She also reports that she turned it off early yesterday, approx 3 hours early.

## 2020-05-18 ENCOUNTER — Encounter: Payer: Self-pay | Admitting: Physical Therapy

## 2020-05-18 ENCOUNTER — Ambulatory Visit
Payer: Medicare Other | Attending: Student in an Organized Health Care Education/Training Program | Admitting: Physical Therapy

## 2020-05-18 DIAGNOSIS — M75102 Unspecified rotator cuff tear or rupture of left shoulder, not specified as traumatic: Secondary | ICD-10-CM | POA: Diagnosis present

## 2020-05-18 DIAGNOSIS — M12812 Other specific arthropathies, not elsewhere classified, left shoulder: Secondary | ICD-10-CM

## 2020-05-18 DIAGNOSIS — M19012 Primary osteoarthritis, left shoulder: Secondary | ICD-10-CM | POA: Diagnosis present

## 2020-05-18 DIAGNOSIS — M25512 Pain in left shoulder: Secondary | ICD-10-CM | POA: Insufficient documentation

## 2020-05-18 DIAGNOSIS — G8929 Other chronic pain: Secondary | ICD-10-CM | POA: Diagnosis present

## 2020-05-18 DIAGNOSIS — M5416 Radiculopathy, lumbar region: Secondary | ICD-10-CM | POA: Diagnosis present

## 2020-05-18 DIAGNOSIS — M25612 Stiffness of left shoulder, not elsewhere classified: Secondary | ICD-10-CM | POA: Diagnosis present

## 2020-05-18 DIAGNOSIS — M6281 Muscle weakness (generalized): Secondary | ICD-10-CM

## 2020-05-18 NOTE — Patient Instructions (Signed)
Access Code: [email or web address]: [email or web address]: 07/16/2021Prepared by: Legrand Como SherkExercises  Seated Shoulder Flexion AAROM with Pulley Behind - 2 x daily - 7 x weekly - 2 sets - 10 reps  Seated Shoulder Abduction AAROM with Pulley Behind - 2 x daily - 7 x weekly - 2 sets - 10 reps  Seated Scapular Retraction - 2 x daily - 7 x weekly - 2 sets - 10 reps  Standing Cervical Retraction - 2 x daily - 7 x weekly - 1 sets - 10 reps  Seated Upper Trapezius Stretch - 2 x daily - 7 x weekly - 1 sets - 3 reps - 20 seconds hold  Circular Shoulder Pendulum with Table Support - 1 x daily - 7 x weekly - 1 sets - 10 reps

## 2020-05-18 NOTE — Therapy (Deleted)
[place] [facility] [address] [place], [place], [address] Phone: [phone]   Fax:  [phone]  Physical Therapy Evaluation  Patient Details  Name: Teresa [name] MRN: [id number] Date of Birth: 09-07-1956 Referring Provider (PT): Dr. Gillis Santa   Encounter Date: 05/18/2020   PT End of Session - 05/18/20 1130    Visit Number 1    Number of Visits 9    Date for PT Re-Evaluation 06/15/20    Authorization - Visit Number 1    Authorization - Number of Visits 10    PT Start Time 1112    Activity Tolerance Patient tolerated treatment well;Patient limited by pain    Behavior During Therapy [facility] for tasks assessed/performed           Past Medical History:  Diagnosis Date  . Headache    migraines  . History of fractured vertebra   . Hyperlipidemia   . Low bone density   . Transfusion of blood product refused for religious reason     Past Surgical History:  Procedure Laterality Date  . ABDOMINAL HYSTERECTOMY    . ARTERY BIOPSY Left 12/18/2015   Procedure: BIOPSY TEMPORAL ARTERY;  Surgeon: Clyde Canterbury, MD;  Location: [facility];  Service: ENT;  Laterality: Left;  . COLONOSCOPY  2000  . EYE SURGERY Left 2014    There were no vitals filed for this visit.    Subjective Assessment - 05/18/20 1120    Subjective Pt. reports chronic c/o L shoulder pain.  See recent MRI report.  No rotator cuff tears noted but tendonopathy evident.  Pt. c/o difficulty with shoulder abduction and flexion (>90 deg.).  Pt. reports chronic h/o back/hip pain and has recent spinal stimulator placed.  Pt. has difficulty donning shirt/ washing hair.  Pt. is L hand dominant.    Pertinent History Pt. states her back limits her with all activities.  Pt. unable to hike and significantly limited with household cleaning/ managing flower beds.  Pt. states she has to take Oxy when pain gets really bad.    Limitations --    How long can you sit comfortably? --     How long can you stand comfortably? --    How long can you walk comfortably? --    Diagnostic tests --    Patient Stated Goals Return to gardening/ household chores with decrease shoulder pain.    Currently in Pain? Yes    Pain Score 3     Pain Location Shoulder    Pain Orientation Left    Pain Descriptors / Indicators Aching    Pain Type Chronic pain    Pain Onset More than a month ago    Aggravating Factors  movement/ reaching/ overhead tasks.   Pain worsens t/o the day.    Multiple Pain Sites Yes    Pain Score 7    Pain Location Back    Pain Orientation Lower    Pain Descriptors / Indicators Constant    Pain Type Chronic pain    Pain Relieving Factors Pt. has spinal stimulator since 04/25/2020              [facility] PT Assessment - 05/18/20 0001      Assessment   Medical Diagnosis Dysfunction of L rotator cuff/ L shoulder pain    Referring Provider (PT) Dr. Gillis Santa    Onset Date/Surgical Date 11/04/19    Hand Dominance Left    Prior Therapy PT for LBP  in past      [place] residence    Living Arrangements Spouse/significant other    Available Help at Discharge Family    Type of [place]      Prior Function   Level of Independence Independent    Vocation Retired    Leisure Traveling, gardening                      Objective measurements completed on examination: See above findings.                 PT Short Term Goals - 01/11/19 1603      PT SHORT TERM GOAL #1   Title Pt will be independent with HEP in order to improve strength and decrease back pain in order to improve pain-free function at home and work.     Time 8    Period Weeks    Status New             PT Long Term Goals - 01/11/19 1603      PT LONG TERM GOAL #1   Title Pt will decrease worst back pain as reported on NPRS by at least 2 points in order to demonstrate clinically significant reduction in back pain.     Baseline  01/10/19 7/10    Time 8    Period Days    Status New      PT LONG TERM GOAL #2   Title Pt will decrease 5TSTS by at least 3 seconds in order to demonstrate clinically significant improvement in LE strength    Baseline 01/10/19 21sec    Time 8    Period Weeks    Status New      PT LONG TERM GOAL #3   Title Patient will increase FOTO score to 62 to demonstrate predicted increase in functional mobility to complete ADLs    Baseline 01/10/19 48    Time 8    Period Weeks    Status New                   Patient will benefit from skilled therapeutic intervention in order to improve the following deficits and impairments:     Visit Diagnosis: Arthritis of left glenohumeral joint  Left rotator cuff tear arthropathy  Chronic left shoulder pain  Shoulder joint stiffness, left  Muscle weakness (generalized)     Problem List Patient Active Problem List   Diagnosis Date Noted  . Arthritis of left glenohumeral joint 12/22/2019  . Left rotator cuff tear arthropathy 12/22/2019  . Lumbar radiculopathy (right L5) 12/16/2018  . Lumbar spondylosis 12/16/2018  . Degeneration of lumbar or lumbosacral intervertebral disc 12/16/2018  . Chronic pain syndrome 12/16/2018  . Iron deficiency anemia 01/24/2016  . Essential (primary) hypertension 09/04/2014  . Encounter for screening colonoscopy 10/20/2013  . INSOMNIA, CHRONIC 09/08/2007  . BACK PAIN, CHRONIC 09/08/2007  . DEPRESSION 08/25/2007  . MIGRAINE HEADACHE 08/25/2007  . GERD 08/25/2007  . Constipation 08/25/2007  . OSTEOPENIA 08/25/2007    Pura Spice 05/18/2020, 11:31 AM  [place] [facility] [address] [place], [place], [address] Phone: [phone]   Fax:  [phone]  Name: Teresa [name] MRN: [id number] Date of Birth: 1956-07-05

## 2020-05-18 NOTE — Therapy (Signed)
[facility] [address] [place], [place], [address] Phone: [phone]   Fax:  [phone]  Physical Therapy Evaluation  Patient Details  Name: Teresa [name] MRN: [id number] Date of Birth: Jul 07, 1956 Referring Provider (PT): Dr. Gillis Santa   Encounter Date: 05/18/2020   PT End of Session - 05/18/20 1130    Visit Number 1    Number of Visits 9    Date for PT Re-Evaluation 06/15/20    Authorization - Visit Number 1    Authorization - Number of Visits 10    PT Start Time 1112    PT Stop Time 1223    PT Time Calculation (min) 71 min    Activity Tolerance Patient tolerated treatment well;Patient limited by pain    Behavior During Therapy [facility] for tasks assessed/performed           Past Medical History:  Diagnosis Date  . Headache    migraines  . History of fractured vertebra   . Hyperlipidemia   . Low bone density   . Transfusion of blood product refused for religious reason     Past Surgical History:  Procedure Laterality Date  . ABDOMINAL HYSTERECTOMY    . ARTERY BIOPSY Left 12/18/2015   Procedure: BIOPSY TEMPORAL ARTERY;  Surgeon: Clyde Canterbury, MD;  Location: [place];  Service: ENT;  Laterality: Left;  . COLONOSCOPY  2000  . EYE SURGERY Left 2014    There were no vitals filed for this visit.    Subjective Assessment - 05/18/20 1120    Subjective Pt. reports chronic c/o L shoulder pain.  See recent MRI report.  No rotator cuff tears noted but tendonopathy evident.  Pt. c/o difficulty with shoulder abduction and flexion (>90 deg.).  Pt. reports chronic h/o back/hip pain and has recent spinal stimulator placed.  Pt. has difficulty donning shirt/ washing hair.  Pt. is L hand dominant. Pt reports pain only 2/10 NPS in mornings but by night rpeorts 8/10 NPS and requires use of oxycodone (1-2) in order to sleep. Pt unable to sleep on L shoulder due to pain. Pt wishes to be able to go in her garden and perform  basic overhead ADL's with less pain.    Pertinent History Pt. states her back limits her with all activities.  Pt. unable to hike and significantly limited with household cleaning/ managing flower beds.  Pt. states she has to take Oxy when pain gets really bad.    Limitations --    How long can you sit comfortably? --    How long can you stand comfortably? --    How long can you walk comfortably? --    Diagnostic tests --    Patient Stated Goals Return to gardening/ household chores with decrease shoulder pain.    Currently in Pain? Yes    Pain Score 3     Pain Location Shoulder    Pain Orientation Left    Pain Descriptors / Indicators Aching    Pain Type Chronic pain    Pain Onset More than a month ago    Aggravating Factors  movement/ reaching/ overhead tasks.   Pain worsens t/o the day.    Multiple Pain Sites Yes    Pain Score 7    Pain Location Back    Pain Orientation Lower    Pain Descriptors / Indicators Constant    Pain Type Chronic pain    Pain Onset More than a month ago  Pain Frequency Constant    Pain Relieving Factors Pt. has spinal stimulator since 04/25/2020              [facility] PT Assessment - 05/18/20 0001      Assessment   Medical Diagnosis Dysfunction of L rotator cuff/ L shoulder pain    Referring Provider (PT) Dr. Gillis Santa    Onset Date/Surgical Date 11/04/19    Hand Dominance Left    Prior Therapy PT for LBP in past      Balance Screen   Has the patient fallen in the past 6 months No    Has the patient had a decrease in activity level because of a fear of falling?  Yes    Is the patient reluctant to leave their home because of a fear of falling?  No      Home Ecologist residence    Living Arrangements Spouse/significant other    Available Help at Discharge Family    Type of [place]      Prior Function   Level of [place] Retired    Leisure Traveling, gardening             OBJECTIVE  MUSCULOSKELETAL: Tremor: Normal Bulk: Normal Tone: Normal  Cervical Screen AROM: WFL and painless with overpressure in all planes Elbow Screen Elbow AROM: WFL  Palpation TTP along L supraspinatus insertion, infraspinatus, and upper trap, thoracic paraspinals T3-T5.  Strength R/L Shoulder abduction (deltoid/supraspinatus, axillary/suprascapular n, C5)- Deferred due to pain and limited AROM 5/4* Shoulder external rotation (infraspinatus/teres minor) 5/4* Shoulder internal rotation (subcapularis/lats/pec major) 5/5 Elbow flexion (biceps brachii, brachialis, brachioradialis, musculoskeletal n, C5-6) 4-/4- Elbow extension (triceps, radial n, C7)  AROM R/L in seated WNL/95 Shoulder flexion WNL/34 Shoulder abduction WNL/ (Not assessed due to pain) Shoulder external rotation *Indicates pain, overpressure performed unless otherwise indicated  PROM R/L WNL/95 Shoulder flexion WNL/34 Shoulder abduction *Indicates pain, overpressure performed unless otherwise indicated  Accessory Motions/Glides Glenohumeral: Posterior: R: Normal, L: hypomobile   Inferior: R: Normal, L: hypomobile Anterior: R: Normal, L: hypomobile   Concordant pain reported in all directions on L    NEUROLOGICAL:  Mental Status  Patient's fund of knowledge is within normal limits for educational level.    Objective measurements completed on examination: See above findings.    PT Education - 05/18/20 1204    Education Details See HEP (education provided for proper technique).  Issued sh. pulley.    Person(s) Educated Patient    Methods Explanation;Demonstration;Handout    Comprehension Verbalized understanding;Returned demonstration            PT Short Term Goals - 05/18/20 1249      PT SHORT TERM GOAL #1   Title Pt will improve L shoulder Flex/abd AROM to >120 degrees to improve independence with ADL's.    Baseline 05/18/2020: flex: 95 deg, Abd: 34 deg    Time 4    Period  Weeks    Status New    Target Date 06/15/20      PT SHORT TERM GOAL #2   Title Pt will improve pain score to <5/10 NPS when going to bed so pt can fall asleep at night.    Baseline 05/18/2020: 8/10 NPS when falling asleep at night    Time 4    Period Weeks    Status New    Target Date 06/15/20      PT SHORT TERM GOAL #3  Title Pt will be able to abduct L shoulder for > 5 min so pt can successfully shave her arm pits.    Baseline 05/18/2020: unable to abd high enough to shave under arms. Limited by pain.    Time 4    Period Weeks    Status New    Target Date 06/15/20      PT SHORT TERM GOAL #4   Title Pt will improve FOTO score to 61 to display perceived improvements in L shoulder function for ADL's.    Baseline 05/18/2020: 44    Time 4    Period Weeks    Status New    Target Date 06/15/20             PT Long Term Goals - 01/11/19 1603      PT LONG TERM GOAL #1   Title Pt will decrease worst back pain as reported on NPRS by at least 2 points in order to demonstrate clinically significant reduction in back pain.     Baseline 01/10/19 7/10    Time 8    Period Days    Status New      PT LONG TERM GOAL #2   Title Pt will decrease 5TSTS by at least 3 seconds in order to demonstrate clinically significant improvement in LE strength    Baseline 01/10/19 21sec    Time 8    Period Weeks    Status New      PT LONG TERM GOAL #3   Title Patient will increase FOTO score to 62 to demonstrate predicted increase in functional mobility to complete ADLs    Baseline 01/10/19 48    Time 8    Period Weeks    Status New                  Plan - 05/18/20 1236    Clinical Impression Statement Pt is a 64 y.o. female referred to PT for chronic L shoulder pain. Via MRI pt has L GHJ arthritic changes and L RTC tendinopathy. Pt has c/o of inability to elevate L shoulder overhead to perform basic ADL's such as putting on clothes, shaving arm pit, brushing hair, etc. Pt current pain is 2/10  NPS in mornings and increased pain in evenings rated 8/10 NPS that goes form dull pain to sharp. Pt can only find minor relief with heat and oxycodone in order to sleep. Pt can not find pain relief otherwise. Pt's RUE AROM and strength are grossly WFL.L elbow/wrist/hand strength and motion are WFL. Pt has concordant pain with resisted ER and IR of L shoulder in supraspinatus insertion. L shoulder flex AROM: 95 deg, L shoulder Abd: 34 degrees with approximate equal measures with PROM in those two planes. Pt sits with forward head posture and rounded shoulders and is TTP at supraspinatus insertion and infraspinatus and thoracic paraspinals at the T3-T5 levels on L side. Pt in supine is hypomobile in GHJ with reports of deep pain in joint that is concordant in AP/PA and inf directions. R shoulder joint mobility WFL and no pain. With forward head posture, L upper trap elevation and pain with ER muscles with resistance and limited over head motion displays signs and symptoms or rotator cuff involvement consistent with possible RTC tendinopathy, impingements, and GHJ arthritic changes with hypomobility and pain with joint mobs. Pt can continue to benefit from skilled PT treatment to improve these impairments so pt can independently perform overhead ADL's with less pain.  Personal Factors and Comorbidities Comorbidity 3+;Age;Time since onset of injury/illness/exacerbation    Examination-Activity Limitations Bathing;Bed Mobility;Dressing;Sleep;Hygiene/Grooming;Lift;Carry;Reach Overhead    Examination-Participation Restrictions Yard Work;Cleaning;Community Activity    Stability/Clinical Decision Making Evolving/Moderate complexity    Clinical Decision Making Moderate    Rehab Potential Fair    PT Frequency 2x / week    PT Duration 4 weeks    PT Treatment/Interventions ADLs/Self Care Home Management;Cryotherapy;Electrical Stimulation;Moist Heat;Ultrasound;Therapeutic activities;Therapeutic exercise;Neuromuscular  re-education;Patient/family education;Manual techniques;Dry needling;Aquatic Therapy;Functional mobility training    PT Next Visit Plan Reassess HEP, AAROM    PT Home Exercise Plan Medbridge: AAROM flex/abd, cervical retractions, pendulums, scap retractions    Consulted and Agree with Plan of Care Patient           Patient will benefit from skilled therapeutic intervention in order to improve the following deficits and impairments:  Decreased mobility, Hypomobility, Increased muscle spasms, Decreased range of motion, Decreased knowledge of precautions, Decreased activity tolerance, Decreased strength, Impaired UE functional use, Impaired flexibility, Postural dysfunction, Pain  Visit Diagnosis: Arthritis of left glenohumeral joint  Left rotator cuff tear arthropathy  Chronic left shoulder pain  Shoulder joint stiffness, left  Muscle weakness (generalized)     Problem List Patient Active Problem List   Diagnosis Date Noted  . Arthritis of left glenohumeral joint 12/22/2019  . Left rotator cuff tear arthropathy 12/22/2019  . Lumbar radiculopathy (right L5) 12/16/2018  . Lumbar spondylosis 12/16/2018  . Degeneration of lumbar or lumbosacral intervertebral disc 12/16/2018  . Chronic pain syndrome 12/16/2018  . Iron deficiency anemia 01/24/2016  . Essential (primary) hypertension 09/04/2014  . Encounter for screening colonoscopy 10/20/2013  . INSOMNIA, CHRONIC 09/08/2007  . BACK PAIN, CHRONIC 09/08/2007  . DEPRESSION 08/25/2007  . MIGRAINE HEADACHE 08/25/2007  . GERD 08/25/2007  . Constipation 08/25/2007  . OSTEOPENIA 08/25/2007   Pura Spice, PT, DPT # [address] SPT 05/18/2020, 2:36 PM  [facility] [address] [place], [place], [address] Phone: [phone]   Fax:  [phone]  Name: Teresa [name] MRN: [id number] Date of Birth: 12/23/1955

## 2020-05-22 ENCOUNTER — Encounter: Payer: Self-pay | Admitting: Physical Therapy

## 2020-05-22 ENCOUNTER — Ambulatory Visit: Payer: Medicare Other | Admitting: Physical Therapy

## 2020-05-22 ENCOUNTER — Other Ambulatory Visit: Payer: Self-pay

## 2020-05-22 DIAGNOSIS — M19012 Primary osteoarthritis, left shoulder: Secondary | ICD-10-CM

## 2020-05-22 DIAGNOSIS — M75102 Unspecified rotator cuff tear or rupture of left shoulder, not specified as traumatic: Secondary | ICD-10-CM

## 2020-05-22 DIAGNOSIS — M12812 Other specific arthropathies, not elsewhere classified, left shoulder: Secondary | ICD-10-CM

## 2020-05-22 DIAGNOSIS — G8929 Other chronic pain: Secondary | ICD-10-CM

## 2020-05-22 DIAGNOSIS — M25512 Pain in left shoulder: Secondary | ICD-10-CM

## 2020-05-22 DIAGNOSIS — M6281 Muscle weakness (generalized): Secondary | ICD-10-CM

## 2020-05-22 DIAGNOSIS — M25612 Stiffness of left shoulder, not elsewhere classified: Secondary | ICD-10-CM

## 2020-05-22 NOTE — Therapy (Signed)
[facility] [address] [place], [place], [address] Phone: [phone]   Fax:  [phone]  Physical Therapy Treatment  Patient Details  Name: Teresa [name] MRN: [id number] Date of Birth: 1956-05-06 Referring Provider (PT): Dr. Gillis Santa   Encounter Date: 05/22/2020   PT End of Session - 05/22/20 1205    Visit Number 2    Number of Visits 9    Date for PT Re-Evaluation 06/15/20    Authorization - Visit Number 2    Authorization - Number of Visits 10    PT Start Time 1115    PT Stop Time 1200    PT Time Calculation (min) 45 min    Activity Tolerance Patient tolerated treatment well;Patient limited by pain;No increased pain    Behavior During Therapy WFL for tasks assessed/performed           Past Medical History:  Diagnosis Date  . Headache    migraines  . History of fractured vertebra   . Hyperlipidemia   . Low bone density   . Transfusion of blood product refused for religious reason     Past Surgical History:  Procedure Laterality Date  . ABDOMINAL HYSTERECTOMY    . ARTERY BIOPSY Left 12/18/2015   Procedure: BIOPSY TEMPORAL ARTERY;  Surgeon: Clyde Canterbury, MD;  Location: [place];  Service: ENT;  Laterality: Left;  . COLONOSCOPY  2000  . EYE SURGERY Left 2014    There were no vitals filed for this visit.   Subjective Assessment - 05/22/20 1203    Subjective Pt reports unable to perform HEP over the weekend due to flare up of LBP. Pt reports current L shoulder pain is 2/10 NPS.    Pertinent History Pt. states her back limits her with all activities.  Pt. unable to hike and significantly limited with household cleaning/ managing flower beds.  Pt. states she has to take Oxy when pain gets really bad.    Patient Stated Goals Return to gardening/ household chores with decrease shoulder pain.    Currently in Pain? Yes    Pain Score 2     Pain Location Shoulder    Pain Orientation Left    Pain  Descriptors / Indicators Aching;Discomfort    Pain Type Chronic pain    Pain Onset More than a month ago    Multiple Pain Sites Yes    Pain Score 7    Pain Location Back    Pain Orientation Right;Left;Lower    Pain Onset More than a month ago          There.ex:   Pulleys in seated: Flex/abd/scaption: 2x10/direction. Mirror used for visual feedback for form/technique.  Seated scap retraction no resistance: 1x12  Standing scap retraction with resistance: yellow TB, 2x10 Standing B shoulder ER with resistance: yellow TB, 2x10  Manual therapy:   STM in seated to L upper trap, L infraspinatus, and L lat to improve L shoulder elevation AROM and to reduce pain (5 min )  Supine Grade 2 PA/AP GHJ mobs for pain relief: 2x30 sec  Supine Grade 3 PA/AP GHJ mobs for improved L shoulder joint ROM: 2x30 sec  Supine L GHJ long arc distraction for pain relief: 2x30 sec   After treatment pain levels still remained 2/10 NPS.    PT Short Term Goals - 05/18/20 1249      PT SHORT TERM GOAL #1   Title Pt will improve L shoulder Flex/abd AROM to >120  degrees to improve independence with ADL's.    Baseline 05/18/2020: flex: 95 deg, Abd: 34 deg    Time 4    Period Weeks    Status New    Target Date 06/15/20      PT SHORT TERM GOAL #2   Title Pt will improve pain score to <5/10 NPS when going to bed so pt can fall asleep at night.    Baseline 05/18/2020: 8/10 NPS when falling asleep at night    Time 4    Period Weeks    Status New    Target Date 06/15/20      PT SHORT TERM GOAL #3   Title Pt will be able to abduct L shoulder for > 5 min so pt can successfully shave her arm pits.    Baseline 05/18/2020: unable to abd high enough to shave under arms. Limited by pain.    Time 4    Period Weeks    Status New    Target Date 06/15/20      PT SHORT TERM GOAL #4   Title Pt will improve FOTO score to 61 to display perceived improvements in L shoulder function for ADL's.    Baseline 05/18/2020: 44     Time 4    Period Weeks    Status New    Target Date 06/15/20             PT Long Term Goals - 01/11/19 1603      PT LONG TERM GOAL #1   Title Pt will decrease worst back pain as reported on NPRS by at least 2 points in order to demonstrate clinically significant reduction in back pain.     Baseline 01/10/19 7/10    Time 8    Period Days    Status New      PT LONG TERM GOAL #2   Title Pt will decrease 5TSTS by at least 3 seconds in order to demonstrate clinically significant improvement in LE strength    Baseline 01/10/19 21sec    Time 8    Period Weeks    Status New      PT LONG TERM GOAL #3   Title Patient will increase FOTO score to 62 to demonstrate predicted increase in functional mobility to complete ADLs    Baseline 01/10/19 48    Time 8    Period Weeks    Status New           Plan - 05/22/20 1206    Clinical Impression Statement Pt's AROM in L shoulder Flex and abd assessed prior to treatment. L shoulder flex ~95 deg before experiencing pain and L shoulder abd ~60 deg before pain. Pt displayed improved form and technique with pulleys in flex/abd with use of mirror as visual feedback for posture and technique. Pt tolerated resisted scapular rows and resisted B shoulder ER with yellow TB with no increase in pain. NO changes in pain after manual therapy and therex. Pt can continue to benefit from skilled PT treatment to restore functional pain free ROM in L shoulder.    Personal Factors and Comorbidities Comorbidity 3+;Age;Time since onset of injury/illness/exacerbation    Examination-Activity Limitations Bathing;Bed Mobility;Dressing;Sleep;Hygiene/Grooming;Lift;Carry;Reach Overhead    Examination-Participation Restrictions Yard Work;Cleaning;Community Activity    Stability/Clinical Decision Making Evolving/Moderate complexity    Clinical Decision Making Moderate    Rehab Potential Fair    PT Frequency 2x / week    PT Duration 4 weeks    PT Treatment/Interventions  ADLs/Self Care Home Management;Cryotherapy;Electrical Stimulation;Moist Heat;Ultrasound;Therapeutic activities;Therapeutic exercise;Neuromuscular re-education;Patient/family education;Manual techniques;Dry needling;Aquatic Therapy;Functional mobility training    PT Next Visit Plan Reassess HEP, AAROM    PT Home Exercise Plan Medbridge: AAROM flex/abd, cervical retractions, pendulums, scap retractions    Consulted and Agree with Plan of Care Patient           Patient will benefit from skilled therapeutic intervention in order to improve the following deficits and impairments:  Decreased mobility, Hypomobility, Increased muscle spasms, Decreased range of motion, Decreased knowledge of precautions, Decreased activity tolerance, Decreased strength, Impaired UE functional use, Impaired flexibility, Postural dysfunction, Pain  Visit Diagnosis: Muscle weakness (generalized)  Arthritis of left glenohumeral joint  Left rotator cuff tear arthropathy  Chronic left shoulder pain  Shoulder joint stiffness, left     Problem List Patient Active Problem List   Diagnosis Date Noted  . Arthritis of left glenohumeral joint 12/22/2019  . Left rotator cuff tear arthropathy 12/22/2019  . Lumbar radiculopathy (right L5) 12/16/2018  . Lumbar spondylosis 12/16/2018  . Degeneration of lumbar or lumbosacral intervertebral disc 12/16/2018  . Chronic pain syndrome 12/16/2018  . Iron deficiency anemia 01/24/2016  . Essential (primary) hypertension 09/04/2014  . Encounter for screening colonoscopy 10/20/2013  . INSOMNIA, CHRONIC 09/08/2007  . BACK PAIN, CHRONIC 09/08/2007  . DEPRESSION 08/25/2007  . MIGRAINE HEADACHE 08/25/2007  . GERD 08/25/2007  . Constipation 08/25/2007  . OSTEOPENIA 08/25/2007   Pura Spice, PT, DPT # [address] SPT 05/22/2020, 5:26 PM  [place] [facility] [address] [place], [place], [address] Phone: [phone]   Fax:   [phone]  Name: Teresa [name] MRN: [id number] Date of Birth: 11-25-1955

## 2020-05-24 ENCOUNTER — Ambulatory Visit: Payer: Medicare Other | Admitting: Physical Therapy

## 2020-05-29 ENCOUNTER — Ambulatory Visit: Payer: Medicare Other | Admitting: Physical Therapy

## 2020-05-29 ENCOUNTER — Other Ambulatory Visit: Payer: Self-pay

## 2020-05-29 ENCOUNTER — Encounter: Payer: Self-pay | Admitting: Physical Therapy

## 2020-05-29 DIAGNOSIS — M75102 Unspecified rotator cuff tear or rupture of left shoulder, not specified as traumatic: Secondary | ICD-10-CM

## 2020-05-29 DIAGNOSIS — M25512 Pain in left shoulder: Secondary | ICD-10-CM

## 2020-05-29 DIAGNOSIS — M5416 Radiculopathy, lumbar region: Secondary | ICD-10-CM

## 2020-05-29 DIAGNOSIS — M6281 Muscle weakness (generalized): Secondary | ICD-10-CM

## 2020-05-29 DIAGNOSIS — M19012 Primary osteoarthritis, left shoulder: Secondary | ICD-10-CM

## 2020-05-29 DIAGNOSIS — G8929 Other chronic pain: Secondary | ICD-10-CM

## 2020-05-29 DIAGNOSIS — M12812 Other specific arthropathies, not elsewhere classified, left shoulder: Secondary | ICD-10-CM

## 2020-05-29 DIAGNOSIS — M25612 Stiffness of left shoulder, not elsewhere classified: Secondary | ICD-10-CM

## 2020-05-29 NOTE — Therapy (Signed)
[facility] [address] [place], [place], [address] Phone: [phone]   Fax:  [phone]  Physical Therapy Treatment  Patient Details  Name: Teresa [name] MRN: [id number] Date of Birth: [birth year] Referring Provider (PT): Dr. Gillis Santa   Encounter Date: 05/29/2020   PT End of Session - 05/29/20 1533    Visit Number 3    Number of Visits 9    Date for PT Re-Evaluation 06/15/20    Authorization - Visit Number 3    Authorization - Number of Visits 10    PT Start Time 9030    PT Stop Time 1201    PT Time Calculation (min) 48 min    Activity Tolerance Patient tolerated treatment well;Patient limited by pain    Behavior During Therapy [facility] for tasks assessed/performed           Past Medical History:  Diagnosis Date  . Headache    migraines  . History of fractured vertebra   . Hyperlipidemia   . Low bone density   . Transfusion of blood product refused for religious reason     Past Surgical History:  Procedure Laterality Date  . ABDOMINAL HYSTERECTOMY    . ARTERY BIOPSY Left 12/18/2015   Procedure: BIOPSY TEMPORAL ARTERY;  Surgeon: Clyde Canterbury, MD;  Location: [facility];  Service: ENT;  Laterality: Left;  . COLONOSCOPY  2000  . EYE SURGERY Left 2014    There were no vitals filed for this visit.   Subjective Assessment - 05/29/20 1531    Subjective Pt reports increase in LBP with radicular symptoms in RLE with N/T and reports of coldness in extremity. Pt states MD is aware and has her holding off on her oxycodone. Pt in increased pain in back rated at 5/10 NPS and a 3/10 NPS in L shoulder.    Pertinent History Pt. states her back limits her with all activities.  Pt. unable to hike and significantly limited with household cleaning/ managing flower beds.  Pt. states she has to take Oxy when pain gets really bad.    Patient Stated Goals Return to gardening/ household chores with decrease shoulder pain.     Currently in Pain? Yes    Pain Score 5     Pain Location Back    Pain Orientation Left;Lower    Pain Descriptors / Indicators Aching;Discomfort;Radiating;Numbness    Pain Type Chronic pain    Pain Onset More than a month ago    Multiple Pain Sites Yes    Pain Score 3    Pain Location Shoulder    Pain Orientation Left    Pain Descriptors / Indicators Aching;Discomfort    Pain Type Chronic pain    Pain Onset More than a month ago    Pain Frequency Intermittent          There.ex:   UBE: 5 min forward, 5 min backward. Discussed HEP during and her plan with her radicular LBP in RLE AAROM: Overhead flexion with wand in sitting. 1x12, with improvements in form/technique and no compensation of L upper trap AAROM with wand: sitting ER: 2x12, with tactile cues and towel b/t elbow and torso for form/technique  Standing 1 DB passes around body to improve L shoulder IR: Clockwise and counter clockwise, 2x30 sec Seated chest press with wand: 2x12 Standing resisted scap retraction: with Red latex free theraband. 2x10   Manual Therapy:   Short arc distraction of L GHJ in sitting for pain  relief: 2x30 sec    L shoulder pain post manual: 3/10 NPS   PT Short Term Goals - 05/18/20 1249      PT SHORT TERM GOAL #1   Title Pt will improve L shoulder Flex/abd AROM to >120 degrees to improve independence with ADL's.    Baseline 05/18/2020: flex: 95 deg, Abd: 34 deg    Time 4    Period Weeks    Status New    Target Date 06/15/20      PT SHORT TERM GOAL #2   Title Pt will improve pain score to <5/10 NPS when going to bed so pt can fall asleep at night.    Baseline 05/18/2020: 8/10 NPS when falling asleep at night    Time 4    Period Weeks    Status New    Target Date 06/15/20      PT SHORT TERM GOAL #3   Title Pt will be able to abduct L shoulder for > 5 min so pt can successfully shave her arm pits.    Baseline 05/18/2020: unable to abd high enough to shave under arms. Limited by pain.     Time 4    Period Weeks    Status New    Target Date 06/15/20      PT SHORT TERM GOAL #4   Title Pt will improve FOTO score to 61 to display perceived improvements in L shoulder function for ADL's.    Baseline 05/18/2020: 44    Time 4    Period Weeks    Status New    Target Date 06/15/20             PT Long Term Goals - 01/11/19 1603      PT LONG TERM GOAL #1   Title Pt will decrease worst back pain as reported on NPRS by at least 2 points in order to demonstrate clinically significant reduction in back pain.     Baseline 01/10/19 7/10    Time 8    Period Days    Status New      PT LONG TERM GOAL #2   Title Pt will decrease 5TSTS by at least 3 seconds in order to demonstrate clinically significant improvement in LE strength    Baseline 01/10/19 21sec    Time 8    Period Weeks    Status New      PT LONG TERM GOAL #3   Title Patient will increase FOTO score to 62 to demonstrate predicted increase in functional mobility to complete ADLs    Baseline 01/10/19 48    Time 8    Period Weeks    Status New                 Plan - 05/29/20 1534    Clinical Impression Statement Prior to treatment L shoulder flexion and abduction AROM measured. Although having pain, pt continues to improve in her AROM. Flex: 108 deg and Abd: 89 deg. Pt very limited in PT due to chronic LBP in making progress with L shoulder. Pt very pain focused requiring pain science education. Pt able to tolerate AAROM with wand in sitting and resisted scap retraction with red TB with no increases in L shoulder pain or back pain.    Personal Factors and Comorbidities Comorbidity 3+;Age;Time since onset of injury/illness/exacerbation    Examination-Activity Limitations Bathing;Bed Mobility;Dressing;Sleep;Hygiene/Grooming;Lift;Carry;Reach Overhead    Examination-Participation Restrictions Yard Work;Cleaning;Community Activity    Stability/Clinical Decision Making Evolving/Moderate complexity  Rehab Potential Fair     PT Frequency 2x / week    PT Duration 4 weeks    PT Treatment/Interventions ADLs/Self Care Home Management;Cryotherapy;Electrical Stimulation;Moist Heat;Ultrasound;Therapeutic activities;Therapeutic exercise;Neuromuscular re-education;Patient/family education;Manual techniques;Dry needling;Aquatic Therapy;Functional mobility training    PT Next Visit Plan AAROM and periscapular strength    PT Home Exercise Plan Medbridge: AAROM flex/abd, cervical retractions, pendulums, scap retractions    Consulted and Agree with Plan of Care Patient           Patient will benefit from skilled therapeutic intervention in order to improve the following deficits and impairments:  Decreased mobility, Hypomobility, Increased muscle spasms, Decreased range of motion, Decreased knowledge of precautions, Decreased activity tolerance, Decreased strength, Impaired UE functional use, Impaired flexibility, Postural dysfunction, Pain  Visit Diagnosis: Muscle weakness (generalized)  Arthritis of left glenohumeral joint  Left rotator cuff tear arthropathy  Chronic left shoulder pain  Shoulder joint stiffness, left  Radiculopathy, lumbar region     Problem List Patient Active Problem List   Diagnosis Date Noted  . Arthritis of left glenohumeral joint 12/22/2019  . Left rotator cuff tear arthropathy 12/22/2019  . Lumbar radiculopathy (right L5) 12/16/2018  . Lumbar spondylosis 12/16/2018  . Degeneration of lumbar or lumbosacral intervertebral disc 12/16/2018  . Chronic pain syndrome 12/16/2018  . Iron deficiency anemia 01/24/2016  . Essential (primary) hypertension 09/04/2014  . Encounter for screening colonoscopy 10/20/2013  . INSOMNIA, CHRONIC 09/08/2007  . BACK PAIN, CHRONIC 09/08/2007  . DEPRESSION 08/25/2007  . MIGRAINE HEADACHE 08/25/2007  . GERD 08/25/2007  . Constipation 08/25/2007  . OSTEOPENIA 08/25/2007   Pura Spice, PT, DPT # [id number], SPT 05/30/2020, 6:54 AM  [facility] [address] [place], [place], [address] Phone: [phone]   Fax:  [phone]  Name: Teresa [name] MRN: [id number] Date of Birth: [birth year]

## 2020-05-31 ENCOUNTER — Ambulatory Visit: Payer: Medicare Other | Admitting: Physical Therapy

## 2020-06-05 ENCOUNTER — Ambulatory Visit: Payer: Medicare Other | Admitting: Physical Therapy

## 2020-06-07 ENCOUNTER — Encounter: Payer: Medicare Other | Admitting: Physical Therapy

## 2020-06-11 ENCOUNTER — Ambulatory Visit
Payer: Medicare Other | Attending: Student in an Organized Health Care Education/Training Program | Admitting: Student in an Organized Health Care Education/Training Program

## 2020-06-11 ENCOUNTER — Encounter: Payer: Self-pay | Admitting: Student in an Organized Health Care Education/Training Program

## 2020-06-11 ENCOUNTER — Other Ambulatory Visit: Payer: Self-pay

## 2020-06-11 VITALS — BP 140/76 | HR 93 | Temp 97.1°F | Resp 16 | Ht 66.0 in | Wt 140.0 lb

## 2020-06-11 DIAGNOSIS — M25561 Pain in right knee: Secondary | ICD-10-CM | POA: Diagnosis present

## 2020-06-11 DIAGNOSIS — M25512 Pain in left shoulder: Secondary | ICD-10-CM | POA: Diagnosis present

## 2020-06-11 DIAGNOSIS — M19012 Primary osteoarthritis, left shoulder: Secondary | ICD-10-CM | POA: Insufficient documentation

## 2020-06-11 DIAGNOSIS — M67912 Unspecified disorder of synovium and tendon, left shoulder: Secondary | ICD-10-CM | POA: Insufficient documentation

## 2020-06-11 DIAGNOSIS — M47816 Spondylosis without myelopathy or radiculopathy, lumbar region: Secondary | ICD-10-CM | POA: Diagnosis present

## 2020-06-11 DIAGNOSIS — M5137 Other intervertebral disc degeneration, lumbosacral region: Secondary | ICD-10-CM | POA: Insufficient documentation

## 2020-06-11 DIAGNOSIS — G894 Chronic pain syndrome: Secondary | ICD-10-CM | POA: Insufficient documentation

## 2020-06-11 DIAGNOSIS — G8929 Other chronic pain: Secondary | ICD-10-CM | POA: Insufficient documentation

## 2020-06-11 MED ORDER — OXYCODONE-ACETAMINOPHEN 5-325 MG PO TABS: 1.0000 | ORAL_TABLET | ORAL | 0 refills | Status: DC | PRN

## 2020-06-11 MED ORDER — TIZANIDINE HCL 4 MG PO TABS: 4.0000 mg | ORAL_TABLET | Freq: Two times a day (BID) | ORAL | 2 refills | Status: DC | PRN

## 2020-06-11 NOTE — Progress Notes (Signed)
PROVIDER NOTE: Information contained herein reflects review and annotations entered in association with encounter. Interpretation of such information and data should be left to medically-trained personnel. Information provided to patient can be located elsewhere in the medical record under "Patient Instructions". Document created using STT-dictation technology, any transcriptional errors that may result from process are unintentional.    Patient: Teresa [name]  Service Category: E/M  Provider: Gillis Santa, MD  DOB: 1955/12/14  DOS: 06/11/2020  Specialty: Interventional Pain Management  MRN: [id number]  Setting: Ambulatory outpatient  PCP: Tracie Harrier, MD  Type: Established Patient    Referring Provider: Tracie Harrier, MD  Location: Office  Delivery: Face-to-face     HPI  Reason for encounter: [name], a 64 y.o. year old female, is here today for evaluation and management of her Lumbar spondylosis [M47.816]. [name] primary complain today is Back Pain (lumbar left ), Knee Pain (right ), and Foot Pain (right) Last encounter: Practice (05/02/2020). My last encounter with her was on 05/02/2020. Pertinent problems: [name] has INSOMNIA, CHRONIC; DEPRESSION; BACK PAIN, CHRONIC; Lumbar radiculopathy (right L5); Lumbar facet arthropathy; Degeneration of lumbar or lumbosacral intervertebral disc; Chronic pain syndrome; Arthritis of left glenohumeral joint; and Left rotator cuff tear arthropathy on their pertinent problem list. Pain Assessment: Severity of   is reported as a 2 /10. Location: Back Lower, Left/denies. Onset: More than a month ago. Quality: Discomfort, Numbness, Other (Comment) (temperature changes in foot). Timing: Constant. Modifying factor(s): pain medications. Vitals:  height is '5\' 6"'  (1.676 m) and weight is 140 lb (63.5 kg). Her temporal temperature is 97.1 F (36.2 C) (abnormal). Her blood pressure is 140/76 and her pulse is 93. Her respiration is 16 and oxygen  saturation is 100%.   Patient presents today to remove her left L4 Sprint peripheral nerve stim lead.  This was placed on 04/25/2020.  While the patient was not able to complete the full 60 days, she does endorse 25 to 30% pain relief in regards to her left low back pain.  She states that she is utilizing less oxycodone, rather than taking 8 to 9/week she is taken approximately 3-4.  She is having numbness in her right foot and right leg.  This is chronic in nature.  She states that her right foot is cold.  I offered the patient a lumbar MRI however she wanted to hold off at this time.  She is also having pain of her right knee.  Discussed obtaining a right knee x-ray.  We will also refill her oxycodone and tizanidine as below.  Pharmacotherapy Assessment   Analgesic: Oxycodone 5 mg approximately 3-4 times a week as needed when she has severe pain  Monitoring: [place] PMP: PDMP not reviewed this encounter.       Pharmacotherapy: No side-effects or adverse reactions reported. Compliance: No problems identified. Effectiveness: Clinically acceptable.  Janett Billow, RN  06/11/2020 10:52 AM  Sign when Signing Visit  Nursing Pain Medication Assessment:  Safety precautions to be maintained throughout the outpatient stay will include: orient to surroundings, keep bed in low position, maintain call bell within reach at all times, provide assistance with transfer out of bed and ambulation.  Medication Inspection Compliance: [name] did not comply with our request to bring her pills to be counted. She was reminded that bringing the medication bottles, even when empty, is a requirement.  Medication: None brought in. Pill/Patch Count: None available to be counted. Bottle Appearance: No container available. Did not  bring bottle(s) to appointment. Filled Date: N/A Last Medication intake:  takes approximately 3 tabs per week depending on amount of activity    UDS:  Summary  Date Value Ref Range Status   04/03/2020 Note  Final    Comment:    ==================================================================== [employer] Select 13 (MW) ==================================================================== Test                             Result       Flag       Units Drug Absent but Declared for Prescription Verification   Oxycodone                      Not Detected UNEXPECTED ng/mg creat ==================================================================== Test                      Result    Flag   Units      Ref Range   Creatinine              194              mg/dL      >=20 ==================================================================== Declared Medications:  The flagging and interpretation on this report are based on the  following declared medications.  Unexpected results may arise from  inaccuracies in the declared medications.  **Note: The testing scope of this panel includes these medications:  Oxycodone  **Note: The testing scope of this panel does not include the  following reported medications:  Acetaminophen  Alendronate (Fosamax)  Buspirone (Buspar)  Calcium  Cholecalciferol  Gabapentin (Neurontin)  Iron (Fergon)  Multivitamin  Nortriptyline (Pamelor)  Tizanidine (Zanaflex)  Topical  Topical Diclofenac  Topiramate (Topamax)  Trazodone (Desyrel)  Zolpidem (Ambien) ==================================================================== For clinical consultation, please call [phone]. ====================================================================      ROS  Constitutional: Denies any fever or chills Gastrointestinal: No reported hemesis, hematochezia, vomiting, or acute GI distress Musculoskeletal: Low back pain, right knee pain Neurological: Numbness and tingling of the right knee, right lower extremity  Medication Review  Calcium, Cholecalciferol, alendronate, busPIRone, ferrous gluconate, gabapentin, multivitamin, nortriptyline,  oxyCODONE-acetaminophen, tiZANidine, topiramate, traZODone, and zolpidem  History Review  Allergy: [name] is allergic to elastic bandages & [zinc], codeine, and morphine. Drug: [name]  reports no history of drug use. Alcohol:  reports no history of alcohol use. Tobacco:  reports that she has never smoked. She has never used smokeless tobacco. Social: [name]  reports that she has never smoked. She has never used smokeless tobacco. She reports that she does not drink alcohol and does not use drugs. Medical:  has a past medical history of Headache, History of fractured vertebra, Hyperlipidemia, Low bone density, and Transfusion of blood product refused for religious reason. Surgical: [name]  has a past surgical history that includes Abdominal hysterectomy; Colonoscopy (2000); Eye surgery (Left, 2014); and Artery Biopsy (Left, 12/18/2015). Family: family history includes Dementia in her mother; Heart attack in her father.  Laboratory Chemistry Profile   Renal Lab Results  Component Value Date   BUN 11 08/14/2014   CREATININE 1.10 (H) 02/25/2018   GFRAA >90 08/14/2014   GFRNONAA 89 (L) 08/14/2014     Hepatic No results found for: AST, ALT, ALBUMIN, ALKPHOS, HCVAB, AMYLASE, LIPASE, AMMONIA   Electrolytes Lab Results  Component Value Date   NA 139 08/14/2014   K 4.0 08/14/2014   CL 99 08/14/2014   CALCIUM 10.2 08/14/2014  Bone No results found for: VD25OH, [id number], [id number], [id number], 25OHVITD1, 25OHVITD2, 25OHVITD3, TESTOFREE, TESTOSTERONE   Inflammation (CRP: Acute Phase) (ESR: Chronic Phase) No results found for: CRP, ESRSEDRATE, LATICACIDVEN     Note: Above Lab results reviewed.   Physical Exam  General appearance: Well nourished, well developed, and well hydrated. In no apparent acute distress Mental status: Alert, oriented x 3 (person, place, & time)       Respiratory: No evidence of acute respiratory distress Eyes: PERLA Vitals: BP 140/76 (BP  Location: Left Arm, Patient Position: Sitting, Cuff Size: Normal)   Pulse 93   Temp (!) 97.1 F (36.2 C) (Temporal)   Resp 16   Ht '5\' 6"'  (1.676 m)   Wt 140 lb (63.5 kg)   SpO2 100%   BMI 22.60 kg/m  BMI: Estimated body mass index is 22.6 kg/m as calculated from the following:   Height as of this encounter: '5\' 6"'  (1.676 m).   Weight as of this encounter: 140 lb (63.5 kg). Ideal: Ideal body weight: 59.3 kg (130 lb 11.7 oz) Adjusted ideal body weight: 61 kg (134 lb 7 oz)   Lumbar Spine Area Exam  Skin & Axial Inspection: No masses, redness, or swelling Alignment: Symmetrical Functional ROM: Improved after treatment       Stability: No instability detected Muscle Tone/Strength: Functionally intact. No obvious neuro-muscular anomalies detected. Sensory (Neurological): Musculoskeletal pain pattern Palpation: No palpable anomalies        Left medial branch Sprint lead was pulled, lead intact, insertion site appropriate, nontender, not erythematous, no drainage.  Gait & Posture Assessment  Ambulation: Unassisted Gait: Relatively normal for age and body habitus Posture: WNL  Lower Extremity Exam    Side: Right lower extremity  Side: Left lower extremity  Stability: No instability observed          Stability: No instability observed          Skin & Extremity Inspection: Skin color, temperature, and hair growth are WNL. No peripheral edema or cyanosis. No masses, redness, swelling, asymmetry, or associated skin lesions. No contractures.  Skin & Extremity Inspection: Skin color, temperature, and hair growth are WNL. No peripheral edema or cyanosis. No masses, redness, swelling, asymmetry, or associated skin lesions. No contractures.  Functional ROM: Pain restricted ROM for hip and knee joints          Functional ROM: Unrestricted ROM                  Muscle Tone/Strength: Functionally intact. No obvious neuro-muscular anomalies detected.  Muscle Tone/Strength: Functionally intact. No  obvious neuro-muscular anomalies detected.  Sensory (Neurological): Neurogenic pain pattern        Sensory (Neurological): Unimpaired        DTR: Patellar: deferred today Achilles: deferred today Plantar: deferred today  DTR: Patellar: deferred today Achilles: deferred today Plantar: deferred today  Palpation: No palpable anomalies  Palpation: No palpable anomalies    Assessment   Status Diagnosis  Controlled Controlled Controlled 1. Lumbar spondylosis   2. Lumbar facet arthropathy (L3/4 and L4/5)   3. Degeneration of lumbar or lumbosacral intervertebral disc   4. Chronic pain of right knee   5. Dysfunction of left rotator cuff   6. Chronic left shoulder pain   7. Arthritis of left glenohumeral joint   8. Chronic pain syndrome      Updated Problems: Problem  Arthritis of Left Glenohumeral Joint  Left Rotator Cuff Tear Arthropathy  Lumbar radiculopathy (right L5)  Lumbar Facet  Arthropathy  Degeneration of Lumbar Or Lumbosacral Intervertebral Disc  Chronic Pain Syndrome  INSOMNIA, CHRONIC   Qualifier: Diagnosis of  By: Maxie Better FNP, Billie-Lynn Lonzo Candy PAIN, CHRONIC   Qualifier: Diagnosis of  By: Maxie Better FNP, Rosalita Levan    DEPRESSION   Qualifier: Diagnosis of  By: Maxie Better FNP, [place] of Care   [name] has a current medication list which includes the following long-term medication(s): ferrous gluconate, gabapentin, topiramate, trazodone, and zolpidem.  1.  Lumbar facet arthropathy, lumbar spondylosis: Status post left L4 Sprint PNS therapy.  Did provide mild to moderate benefit.  Can consider RFA in the future if patient has return of low back pain that is similar in intensity to pre-Sprint PNS therapy. 2.  Chronic pain syndrome: Refill oxycodone and tizanidine as below.  Continue gabapentin as prescribed.  No refills needed. 3.  Chronic right knee pain: Recommend right knee x-rays 4.  Right lower extremity  paresthesias, numbness: Offered patient referral to neurology for nerve conduction velocity/EMG study as well as lumbar MRI.  Patient states that she will think about this but wants to hold off at this time.  Pharmacotherapy (Medications Ordered): Meds ordered this encounter  Medications  . oxyCODONE-acetaminophen (PERCOCET/ROXICET) 5-325 MG tablet    Sig: Take 1 tablet by mouth as needed.    Dispense:  30 tablet    Refill:  0    For chronic pain syndrome  . tiZANidine (ZANAFLEX) 4 MG tablet    Sig: Take 1 tablet (4 mg total) by mouth 2 (two) times daily as needed for muscle spasms.    Dispense:  60 tablet    Refill:  2   Orders:  Orders Placed This Encounter  Procedures  . DG Knee 1-2 Views Right    Standing Status:   Future    Standing Expiration Date:   06/11/2021    Order Specific Question:   Reason for Exam (SYMPTOM  OR DIAGNOSIS REQUIRED)    Answer:   Right knee pain/arthralgia    Order Specific Question:   Preferred imaging location?    Answer:   [facility]    Order Specific Question:   Call Results- Best Contact Number?    Answer:   [phone] (Pain Clinic facility) (Dr. Dossie Arbour)   Follow-up plan:   Return in about 8 weeks (around 08/06/2020) for Medication Management, in person.     Status post left L3, L4, L5, S1 RFA on 06/15/2019, R L3,4,5, S1 RFA on 06/29/2019, left posterior glenohumeral shoulder steroid injection 01/02/2020: Not effective.  Left L4 and Sprint peripheral nerve stimulation on 04/25/2020, removed 06/11/2020.       Recent Visits Date Type Provider Dept  04/25/20 Procedure visit Gillis Santa, MD [facility]  04/03/20 Office Visit Gillis Santa, MD [facility]  Showing recent visits within past 90 days and meeting all other requirements Today's Visits Date Type Provider Dept  06/11/20 Procedure visit Gillis Santa, MD [facility]  Showing today's visits and meeting all other requirements Future Appointments Date Type  Provider Dept  06/25/20 Appointment Gillis Santa, MD [facility]  Showing future appointments within next 90 days and meeting all other requirements  I discussed the assessment and treatment plan with the patient. The patient was provided an opportunity to ask questions and all were answered. The patient agreed with the plan and demonstrated an understanding of the instructions.  Patient advised to call  back or seek an in-person evaluation if the symptoms or condition worsens.  Duration of encounter:1mnutes.  Note by: BGillis Santa MD Date: 06/11/2020; Time: 11:31 AM

## 2020-06-11 NOTE — Progress Notes (Signed)
Nursing Pain Medication Assessment:  Safety precautions to be maintained throughout the outpatient stay will include: orient to surroundings, keep bed in low position, maintain call bell within reach at all times, provide assistance with transfer out of bed and ambulation.  Medication Inspection Compliance: Teresa [name] did not comply with our request to bring her pills to be counted. She was reminded that bringing the medication bottles, even when empty, is a requirement.  Medication: None brought in. Pill/Patch Count: None available to be counted. Bottle Appearance: No container available. Did not bring bottle(s) to appointment. Filled Date: N/A Last Medication intake:  takes approximately 3 tabs per week depending on amount of activity

## 2020-06-12 ENCOUNTER — Telehealth: Payer: Self-pay

## 2020-06-12 ENCOUNTER — Ambulatory Visit: Payer: Medicare Other | Admitting: Physical Therapy

## 2020-06-12 NOTE — Telephone Encounter (Signed)
Pt was called and stated that she was little sore. She was informed to use heat today and call the clinic back if she need [place].

## 2020-06-14 ENCOUNTER — Encounter: Payer: Medicare Other | Admitting: Physical Therapy

## 2020-06-19 ENCOUNTER — Ambulatory Visit: Payer: Medicare Other | Admitting: Physical Therapy

## 2020-06-21 ENCOUNTER — Encounter: Payer: Medicare Other | Admitting: Physical Therapy

## 2020-06-25 ENCOUNTER — Ambulatory Visit: Payer: Medicare Other | Admitting: Student in an Organized Health Care Education/Training Program

## 2020-06-26 ENCOUNTER — Encounter: Payer: Medicare Other | Admitting: Physical Therapy

## 2020-07-03 ENCOUNTER — Encounter: Payer: Medicare Other | Admitting: Student in an Organized Health Care Education/Training Program

## 2020-08-02 ENCOUNTER — Encounter: Payer: Medicare Other | Admitting: Student in an Organized Health Care Education/Training Program

## 2020-08-30 ENCOUNTER — Other Ambulatory Visit: Payer: Self-pay

## 2020-08-30 ENCOUNTER — Encounter: Payer: Self-pay | Admitting: Student in an Organized Health Care Education/Training Program

## 2020-08-30 ENCOUNTER — Ambulatory Visit
Payer: Medicare Other | Attending: Student in an Organized Health Care Education/Training Program | Admitting: Student in an Organized Health Care Education/Training Program

## 2020-08-30 VITALS — BP 133/68 | HR 103 | Temp 97.1°F | Ht 66.0 in | Wt 135.0 lb

## 2020-08-30 DIAGNOSIS — M5416 Radiculopathy, lumbar region: Secondary | ICD-10-CM

## 2020-08-30 DIAGNOSIS — G894 Chronic pain syndrome: Secondary | ICD-10-CM

## 2020-08-30 DIAGNOSIS — M67912 Unspecified disorder of synovium and tendon, left shoulder: Secondary | ICD-10-CM | POA: Diagnosis not present

## 2020-08-30 DIAGNOSIS — M25512 Pain in left shoulder: Secondary | ICD-10-CM | POA: Diagnosis not present

## 2020-08-30 DIAGNOSIS — M47816 Spondylosis without myelopathy or radiculopathy, lumbar region: Secondary | ICD-10-CM

## 2020-08-30 DIAGNOSIS — G8929 Other chronic pain: Secondary | ICD-10-CM

## 2020-08-30 DIAGNOSIS — M25561 Pain in right knee: Secondary | ICD-10-CM | POA: Diagnosis present

## 2020-08-30 DIAGNOSIS — M19012 Primary osteoarthritis, left shoulder: Secondary | ICD-10-CM

## 2020-08-30 DIAGNOSIS — M5137 Other intervertebral disc degeneration, lumbosacral region: Secondary | ICD-10-CM | POA: Diagnosis present

## 2020-08-30 MED ORDER — OXYCODONE-ACETAMINOPHEN 5-325 MG PO TABS: 1.0000 | ORAL_TABLET | ORAL | 0 refills | Status: AC | PRN

## 2020-08-30 MED ORDER — TIZANIDINE HCL 4 MG PO TABS: 4.0000 mg | ORAL_TABLET | Freq: Two times a day (BID) | ORAL | 2 refills | Status: DC | PRN

## 2020-08-30 NOTE — Progress Notes (Signed)
PROVIDER NOTE: Information contained herein reflects review and annotations entered in association with encounter. Interpretation of such information and data should be left to medically-trained personnel. Information provided to patient can be located elsewhere in the medical record under "Patient Instructions". Document created using STT-dictation technology, any transcriptional errors that may result from process are unintentional.    Patient: Teresa [name]  Service Category: E/M  Provider: Gillis Santa, MD  DOB: 1956/09/25  DOS: 08/30/2020  Specialty: Interventional Pain Management  MRN: [id number]  Setting: Ambulatory outpatient  PCP: Tracie Harrier, MD  Type: Established Patient    Referring Provider: Tracie Harrier, MD  Location: Office  Delivery: Face-to-face     HPI  Teresa [name], a 64 y.o. year old female, is here today because of her Lumbar spondylosis [M47.816]. Teresa [name] primary complain today is Back Pain Last encounter: My last encounter with her was on 06/11/2020. Pertinent problems: Teresa [name] has INSOMNIA, CHRONIC; DEPRESSION; BACK PAIN, CHRONIC; Lumbar radiculopathy (right L5); Lumbar facet arthropathy; Degeneration of lumbar or lumbosacral intervertebral disc; Chronic pain syndrome; Arthritis of left glenohumeral joint; and Left rotator cuff tear arthropathy on their pertinent problem list. Pain Assessment: Severity of Chronic pain is reported as a 2 /10. Location: Back Lower/Denies. Onset: More than a month ago. Quality: Sharp. Timing: Constant. Modifying factor(s): laying down and meds. Vitals:  height is _0  (1.676 m) and weight is 135 lb (61.2 kg). Her temperature is 97.1 F (36.2 C) (abnormal). Her blood pressure is 133/68 and her pulse is 103 (abnormal). Her oxygen saturation is 99%.   Reason for encounter: medication management.   Is finding benefit in regards to her low back after SPRINT peripheral nerve stimulation. Although she continues to have pain,  states that she is utilizing less Oxycodone (only 2 tablets per week).  Feels that peripheral nerve stimulation of lumbar medial branch nerve was beneficial overall for chronic pain syndrome and still continues to provide her with relief.  Is having more right shoulder pain. Has been working outside. Was at [place] with son past weekend and able to participate in activities.  Here today for refill of Oxycodone.  Pharmacotherapy Assessment   07/06/2020  06/11/2020   1  Oxycodone-Acetaminophen 5-325 30.00  30  Bi Lat  [id number]  Nor (0921)  0/0  7.50 MME  Medicare  [place]       Monitoring:  PMP: PDMP reviewed during this encounter.       Pharmacotherapy: No side-effects or adverse reactions reported. Compliance: No problems identified. Effectiveness: Clinically acceptable.  Chauncey Fischer, RN  08/30/2020  9:48 AM  Sign when Signing Visit Nursing Pain Medication Assessment:  Safety precautions to be maintained throughout the outpatient stay will include: orient to surroundings, keep bed in low position, maintain call bell within reach at all times, provide assistance with transfer out of bed and ambulation.  Medication Inspection Compliance: Pill count conducted under aseptic conditions, in front of the patient. Neither the pills nor the bottle was removed from the patient's sight at any time. Once count was completed pills were immediately returned to the patient in their original bottle.  Medication: Oxycodone/APAP Pill/Patch Count: 16 of 30 pills remain Pill/Patch Appearance: Markings consistent with prescribed medication Bottle Appearance: Standard pharmacy container. Clearly labeled. Filled Date: 59 / 3 / 21 Last Medication intake:  TodaySafety precautions to be maintained throughout the outpatient stay will include: orient to surroundings, keep bed in low position, maintain call bell within reach at all times,  provide assistance with transfer out of bed and ambulation.     UDS:  Summary   Date Value Ref Range Status  04/03/2020 Note  Final    Comment:    ==================================================================== Tox[employer] Select 13 (MW) ==================================================================== Test                             Result       Flag       Units Drug Absent but Declared for Prescription Verification   Oxycodone                      Not Detected UNEXPECTED ng/mg creat ==================================================================== Test                      Result    Flag   Units      Ref Range   Creatinine              194              mg/dL      >=20 ==================================================================== Declared Medications:  The flagging and interpretation on this report are based on the  following declared medications.  Unexpected results may arise from  inaccuracies in the declared medications.  **Note: The testing scope of this panel includes these medications:  Oxycodone  **Note: The testing scope of this panel does not include the  following reported medications:  Acetaminophen  Alendronate (Fosamax)  Buspirone (Buspar)  Calcium  Cholecalciferol  Gabapentin (Neurontin)  Iron (Fergon)  Multivitamin  Nortriptyline (Pamelor)  Tizanidine (Zanaflex)  Topical  Topical Diclofenac  Topiramate (Topamax)  Trazodone (Desyrel)  Zolpidem (Ambien) ==================================================================== For clinical consultation, please call [phone]. ====================================================================      ROS  Constitutional: Denies any fever or chills Gastrointestinal: No reported hemesis, hematochezia, vomiting, or acute GI distress Musculoskeletal: Left shoulder pain Neurological: No reported episodes of acute onset apraxia, aphasia, dysarthria, agnosia, amnesia, paralysis, loss of coordination, or loss of consciousness  Medication Review  Calcium, Cholecalciferol,  alendronate, busPIRone, ferrous gluconate, gabapentin, multivitamin, nortriptyline, oxyCODONE-acetaminophen, tiZANidine, topiramate, traZODone, and zolpidem  History Review  Allergy: Teresa [name] is allergic to elastic bandages & [zinc], codeine, and morphine. Drug: Teresa [name]  reports no history of drug use. Alcohol:  reports no history of alcohol use. Tobacco:  reports that she has never smoked. She has never used smokeless tobacco. Social: Teresa [name]  reports that she has never smoked. She has never used smokeless tobacco. She reports that she does not drink alcohol and does not use drugs. Medical:  has a past medical history of Headache, History of fractured vertebra, Hyperlipidemia, Low bone density, and Transfusion of blood product refused for religious reason. Surgical: Teresa [name]  has a past surgical history that includes Abdominal hysterectomy; Colonoscopy (2000); Eye surgery (Left, 2014); and Artery Biopsy (Left, 12/18/2015). Family: family history includes Dementia in her mother; Heart attack in her father.  Laboratory Chemistry Profile   Renal Lab Results  Component Value Date   BUN 11 08/14/2014   CREATININE 1.10 (H) 02/25/2018   GFRAA >90 08/14/2014   GFRNONAA 89 (L) 08/14/2014     Hepatic No results found for: AST, ALT, ALBUMIN, ALKPHOS, HCVAB, AMYLASE, LIPASE, AMMONIA   Electrolytes Lab Results  Component Value Date   NA 139 08/14/2014   K 4.0 08/14/2014   CL 99 08/14/2014   CALCIUM 10.2 08/14/2014     Bone No  results found for: VD25OH, H139778, [id number], BW3893TD4, 25OHVITD1, 25OHVITD2, 25OHVITD3, TESTOFREE, TESTOSTERONE   Inflammation (CRP: Acute Phase) (ESR: Chronic Phase) No results found for: CRP, ESRSEDRATE, LATICACIDVEN     Note: Above Lab results reviewed.   Physical Exam  General appearance: Well nourished, well developed, and well hydrated. In no apparent acute distress Mental status: Alert, oriented x 3 (person, place, & time)        Respiratory: No evidence of acute respiratory distress Eyes: PERLA Vitals: BP 133/68   Pulse (!) 103   Temp (!) 97.1 F (36.2 C)   Ht _0  (1.676 m)   Wt 135 lb (61.2 kg)   SpO2 99%   BMI 21.79 kg/m  BMI: Estimated body mass index is 21.79 kg/m as calculated from the following:   Height as of this encounter: _1  (1.676 m).   Weight as of this encounter: 135 lb (61.2 kg). Ideal: Ideal body weight: 59.3 kg (130 lb 11.7 oz) Adjusted ideal body weight: 60.1 kg (132 lb 7 oz)  Upper Extremity (UE) Exam    Side: Right upper extremity  Side: Left upper extremity  Skin & Extremity Inspection: Skin color, temperature, and hair growth are WNL. No peripheral edema or cyanosis. No masses, redness, swelling, asymmetry, or associated skin lesions. No contractures.  Skin & Extremity Inspection: Skin color, temperature, and hair growth are WNL. No peripheral edema or cyanosis. No masses, redness, swelling, asymmetry, or associated skin lesions. No contractures.  Functional ROM: Unrestricted ROM          Functional ROM: Pain restricted ROM for shoulder and elbow  Muscle Tone/Strength: Functionally intact. No obvious neuro-muscular anomalies detected.  Muscle Tone/Strength: Functionally intact. No obvious neuro-muscular anomalies detected.  Sensory (Neurological): Unimpaired          Sensory (Neurological): Arthropathic arthralgia          Palpation: No palpable anomalies              Palpation: No palpable anomalies              Provocative Test(s):  Phalen's test: deferred Tinel's test: deferred Apley's scratch test (touch opposite shoulder):  Action 1 (Across chest): deferred Action 2 (Overhead): deferred Action 3 (LB reach): deferred   Provocative Test(s):  Phalen's test: deferred Tinel's test: deferred Apley's scratch test (touch opposite shoulder):  Action 1 (Across chest): Decreased ROM Action 2 (Overhead): Decreased ROM Action 3 (LB reach): Decreased ROM     Lumbar Spine Area  Exam  Skin & Axial Inspection: No masses, redness, or swelling Alignment: Symmetrical Functional ROM: Improved after treatment       with Sprint peripheral nerve stimulation Stability: No instability detected Muscle Tone/Strength: Functionally intact. No obvious neuro-muscular anomalies detected. Sensory (Neurological): Musculoskeletal pain pattern Palpation: No palpable anomalies        Gait & Posture Assessment  Ambulation: Unassisted Gait: Relatively normal for age and body habitus Posture: WNL  Lower Extremity Exam    Side: Right lower extremity  Side: Left lower extremity  Stability: No instability observed          Stability: No instability observed          Skin & Extremity Inspection: Skin color, temperature, and hair growth are WNL. No peripheral edema or cyanosis. No masses, redness, swelling, asymmetry, or associated skin lesions. No contractures.  Skin & Extremity Inspection: Skin color, temperature, and hair growth are WNL. No peripheral edema or cyanosis. No masses, redness, swelling, asymmetry, or associated skin  lesions. No contractures.  Functional ROM: Pain restricted ROM for hip and knee joints          Functional ROM: Unrestricted ROM                  Muscle Tone/Strength: Functionally intact. No obvious neuro-muscular anomalies detected.  Muscle Tone/Strength: Functionally intact. No obvious neuro-muscular anomalies detected.  Sensory (Neurological): Neurogenic pain pattern        Sensory (Neurological): Unimpaired        DTR: Patellar: deferred today Achilles: deferred today Plantar: deferred today  DTR: Patellar: deferred today Achilles: deferred today Plantar: deferred today  Palpation: No palpable anomalies  Palpation: No palpable anomalies     Assessment   Status Diagnosis  Controlled Controlled Controlled 1. Lumbar spondylosis   2. Lumbar facet arthropathy (L3/4 and L4/5)   3. Degeneration of lumbar or lumbosacral intervertebral disc    4. Dysfunction of left rotator cuff   5. Chronic left shoulder pain   6. Chronic pain syndrome   7. Lumbar radiculopathy (right L5)   8. Chronic pain of right knee   9. Arthritis of left glenohumeral joint      Plan of Care  Teresa [name] has a current medication list which includes the following long-term medication(s): ferrous gluconate, gabapentin, topiramate, trazodone, and zolpidem.  Pharmacotherapy (Medications Ordered): Meds ordered this encounter  Medications  . oxyCODONE-acetaminophen (PERCOCET/ROXICET) 5-325 MG tablet    Sig: Take 1 tablet by mouth as needed.    Dispense:  30 tablet    Refill:  0    For chronic pain syndrome  . tiZANidine (ZANAFLEX) 4 MG tablet    Sig: Take 1 tablet (4 mg total) by mouth 2 (two) times daily as needed for muscle spasms.    Dispense:  60 tablet    Refill:  2    Follow-up plan:   Return if symptoms worsen or fail to improve.     Status post left L3, L4, L5, S1 RFA on 06/15/2019, R L3,4,5, S1 RFA on 06/29/2019, left posterior glenohumeral shoulder steroid injection 01/02/2020: Not effective.  Left L4 and Sprint peripheral nerve stimulation on 04/25/2020, removed 06/11/2020.  Overall helpful for her low back pain.       Recent Visits Date Type Provider Dept  06/11/20 Procedure visit Gillis Santa, MD [facility]  Showing recent visits within past 90 days and meeting all other requirements Today's Visits Date Type Provider Dept  08/30/20 Office Visit Gillis Santa, MD [facility]  Showing today's visits and meeting all other requirements Future Appointments No visits were found meeting these conditions. Showing future appointments within next 90 days and meeting all other requirements  I discussed the assessment and treatment plan with the patient. The patient was provided an opportunity to ask questions and all were answered. The patient agreed with the plan and demonstrated an understanding of the  instructions.  Patient advised to call back or seek an in-person evaluation if the symptoms or condition worsens.  Duration of encounter: 30 minutes.  Note by: Gillis Santa, MD Date: 08/30/2020; Time: 10:03 AM

## 2020-08-30 NOTE — Progress Notes (Signed)
Nursing Pain Medication Assessment:  Safety precautions to be maintained throughout the outpatient stay will include: orient to surroundings, keep bed in low position, maintain call bell within reach at all times, provide assistance with transfer out of bed and ambulation.  Medication Inspection Compliance: Pill count conducted under aseptic conditions, in front of the patient. Neither the pills nor the bottle was removed from the patient's sight at any time. Once count was completed pills were immediately returned to the patient in their original bottle.  Medication: Oxycodone/APAP Pill/Patch Count: 16 of 30 pills remain Pill/Patch Appearance: Markings consistent with prescribed medication Bottle Appearance: Standard pharmacy container. Clearly labeled. Filled Date: 1 / 3 / 21 Last Medication intake:  TodaySafety precautions to be maintained throughout the outpatient stay will include: orient to surroundings, keep bed in low position, maintain call bell within reach at all times, provide assistance with transfer out of bed and ambulation.

## 2020-10-04 ENCOUNTER — Other Ambulatory Visit: Payer: Self-pay

## 2020-10-04 ENCOUNTER — Ambulatory Visit
Payer: Medicare Other | Attending: Student in an Organized Health Care Education/Training Program | Admitting: Student in an Organized Health Care Education/Training Program

## 2020-10-04 ENCOUNTER — Encounter: Payer: Self-pay | Admitting: Student in an Organized Health Care Education/Training Program

## 2020-10-04 DIAGNOSIS — G8929 Other chronic pain: Secondary | ICD-10-CM | POA: Insufficient documentation

## 2020-10-04 DIAGNOSIS — M75101 Unspecified rotator cuff tear or rupture of right shoulder, not specified as traumatic: Secondary | ICD-10-CM | POA: Diagnosis not present

## 2020-10-04 DIAGNOSIS — M12811 Other specific arthropathies, not elsewhere classified, right shoulder: Secondary | ICD-10-CM | POA: Insufficient documentation

## 2020-10-04 DIAGNOSIS — G894 Chronic pain syndrome: Secondary | ICD-10-CM | POA: Diagnosis not present

## 2020-10-04 DIAGNOSIS — M7918 Myalgia, other site: Secondary | ICD-10-CM

## 2020-10-04 DIAGNOSIS — M25511 Pain in right shoulder: Secondary | ICD-10-CM | POA: Diagnosis not present

## 2020-10-04 NOTE — Progress Notes (Signed)
Patient: Teresa [name]  Service Category: E/M  Provider: Gillis Santa, MD  DOB: [birth year]  DOS: 10/04/2020  Location: Office  MRN: [id number]  Setting: Ambulatory outpatient  Referring Provider: Tracie Harrier, MD  Type: Established Patient  Specialty: Interventional Pain Management  PCP: Tracie Harrier, MD  Location: Home  Delivery: TeleHealth     Virtual Encounter - Pain Management PROVIDER NOTE: Information contained herein reflects review and annotations entered in association with encounter. Interpretation of such information and data should be left to medically-trained personnel. Information provided to patient can be located elsewhere in the medical record under "Patient Instructions". Document created using STT-dictation technology, any transcriptional errors that may result from process are unintentional.    Contact & Pharmacy Preferred: [place]: [phone] (home) Mobile: [phone] (mobile) E-mail: [email or web address]  [id number] [place] - [id number]: [phone]: [phone]  Pre-screening  Ms. APoehleroffered "in-person" vs "virtual" encounter. She indicated preferring virtual for this encounter.   Reason COVID-19*  Social distancing based on CDC and AMA recommendations.   I contacted DEloy Endon 10/04/2020 via video conference.      I clearly identified myself as BGillis Santa MD. I verified that I was speaking with the correct person using two identifiers (Name: DJONNAE FONSECA and date of birth: [phone].  Consent I sought verbal advanced consent from DEloy Endfor virtual visit interactions. I informed Ms. ALaveyof possible security and privacy concerns, risks, and limitations associated with providing "not-in-person" medical evaluation and management services. I also informed Ms. AStambaughof the availability of "in-person" appointments. Finally, I informed her  that there would be a charge for the virtual visit and that she could be  personally, fully or partially, financially responsible for it. Ms. ALeamerexpressed understanding and agreed to proceed.   Historic Elements   Ms. DALLYAH HEATHERis a 64y.o. year old, female patient evaluated today after our last contact on 08/30/2020. Ms. AFlater has a past medical history of Headache, History of fractured vertebra, Hyperlipidemia, Low bone density, and Transfusion of blood product refused for religious reason. She also  has a past surgical history that includes Abdominal hysterectomy; Colonoscopy (2000); Eye surgery (Left, 2014); and Artery Biopsy (Left, 12/18/2015). Ms. AHowsehas a current medication list which includes the following prescription(s): alendronate, buspirone, calcium, cholecalciferol, ferrous gluconate, gabapentin, multivitamin, nortriptyline, tizanidine, topiramate, trazodone, and zolpidem. She  reports that she has never smoked. She has never used smokeless tobacco. She reports that she does not drink alcohol and does not use drugs. Ms. AShuttis allergic to elastic bandages & [zinc], codeine, and morphine.   HPI  Today, she is being contacted for worsening of previously known (established) problem  Worsening right shoulder pain. Will order right shoulder xray and recommend right trapezius trigger point injection and possible right glenohumeral steroid injection based upon x-ray results.  Patient states that she is utilizing more oxycodone given increased right shoulder pain.  She is having trouble sleeping at night as well.   UDS:  Summary  Date Value Ref Range Status  04/03/2020 Note  Final    Comment:    ==================================================================== Tox[employer]SU[employer] Select 13 (MW) ==================================================================== Test                             Result       Flag  Units Drug Absent but Declared for Prescription Verification    Oxycodone                      Not Detected UNEXPECTED ng/mg creat ==================================================================== Test                      Result    Flag   Units      Ref Range   Creatinine              194              mg/dL      >=20 ==================================================================== Declared Medications:  The flagging and interpretation on this report are based on the  following declared medications.  Unexpected results may arise from  inaccuracies in the declared medications.  **Note: The testing scope of this panel includes these medications:  Oxycodone  **Note: The testing scope of this panel does not include the  following reported medications:  Acetaminophen  Alendronate (Fosamax)  Buspirone (Buspar)  Calcium  Cholecalciferol  Gabapentin (Neurontin)  Iron (Fergon)  Multivitamin  Nortriptyline (Pamelor)  Tizanidine (Zanaflex)  Topical  Topical Diclofenac  Topiramate (Topamax)  Trazodone (Desyrel)  Zolpidem (Ambien) ==================================================================== For clinical consultation, please call [phone]. ====================================================================     Laboratory Chemistry Profile   Renal Lab Results  Component Value Date   BUN 11 08/14/2014   CREATININE 1.10 (H) 02/25/2018   GFRAA >90 08/14/2014   GFRNONAA 89 (L) 08/14/2014     Hepatic No results found for: AST, ALT, ALBUMIN, ALKPHOS, HCVAB, AMYLASE, LIPASE, AMMONIA   Electrolytes Lab Results  Component Value Date   NA 139 08/14/2014   K 4.0 08/14/2014   CL 99 08/14/2014   CALCIUM 10.2 08/14/2014     Bone No results found for: VD25OH, VD125OH2TOT, ZH0865HQ4, ON6295MW4, 25OHVITD1, 25OHVITD2, 25OHVITD3, TESTOFREE, TESTOSTERONE   Inflammation (CRP: Acute Phase) (ESR: Chronic Phase) No results found for: CRP, ESRSEDRATE, LATICACIDVEN     Note: Above Lab results reviewed.  Assessment  The primary  encounter diagnosis was Chronic right shoulder pain. Diagnoses of Myofascial pain syndrome, Right rotator cuff tear arthropathy, and Chronic pain syndrome were also pertinent to this visit.  Plan of Care   Ms. CODI FOLKERTS has a current medication list which includes the following long-term medication(s): ferrous gluconate, gabapentin, topiramate, trazodone, and zolpidem.   Orders:  Orders Placed This Encounter  Procedures  . SHOULDER INJECTION    Standing Status:   Future    Standing Expiration Date:   01/02/2021    Scheduling Instructions:     Side: RIGHT     Sedation: without     Timeframe: As soon as schedule allows    Order Specific Question:   Where will this procedure be performed?    Answer:   [facility]    Comments:   Rayvin Abid  . TRIGGER POINT INJECTION    Standing Status:   Future    Standing Expiration Date:   01/02/2021    Scheduling Instructions:     Right trapezius TPI    Order Specific Question:   Where will this procedure be performed?    Answer:   [facility]  . DG Shoulder Right    Standing Status:   Future    Standing Expiration Date:   04/04/2021    Order Specific Question:   Reason for Exam (SYMPTOM  OR DIAGNOSIS REQUIRED)    Answer:   right  shoulder pain    Order Specific Question:   Preferred imaging location?    Answer:   [facility]   Follow-up plan:   Return in about 3 weeks (around 10/25/2020) for R shoulder injection + TPI , without sedation.     Status post left L3, L4, L5, S1 RFA on 06/15/2019, R L3,4,5, S1 RFA on 06/29/2019, left posterior glenohumeral shoulder steroid injection 01/02/2020: Not effective.  Left L4 and Sprint peripheral nerve stimulation on 04/25/2020, removed 06/11/2020.         Recent Visits Date Type Provider Dept  08/30/20 Office Visit Gillis Santa, MD [facility]  Showing recent visits within past 90 days and meeting all other requirements Today's Visits Date Type Provider Dept  10/04/20  Telemedicine Gillis Santa, MD [facility]  Showing today's visits and meeting all other requirements Future Appointments Date Type Provider Dept  11/06/20 Appointment Gillis Santa, MD [facility]  Showing future appointments within next 90 days and meeting all other requirements  I discussed the assessment and treatment plan with the patient. The patient was provided an opportunity to ask questions and all were answered. The patient agreed with the plan and demonstrated an understanding of the instructions.  Patient advised to call back or seek an in-person evaluation if the symptoms or condition worsens.  Duration of encounter: 59mnutes.  Note by: BGillis Santa MD Date: 10/04/2020; Time: 4:06 PM

## 2020-10-05 ENCOUNTER — Ambulatory Visit
Admission: RE | Admit: 2020-10-05 | Discharge: 2020-10-05 | Disposition: A | Discharge status: Home / Self Care | Payer: Medicare Other | Source: Ambulatory Visit | Attending: Student in an Organized Health Care Education/Training Program | Admitting: Student in an Organized Health Care Education/Training Program

## 2020-10-05 DIAGNOSIS — M12811 Other specific arthropathies, not elsewhere classified, right shoulder: Secondary | ICD-10-CM | POA: Insufficient documentation

## 2020-10-05 DIAGNOSIS — G8929 Other chronic pain: Secondary | ICD-10-CM

## 2020-10-05 DIAGNOSIS — M75101 Unspecified rotator cuff tear or rupture of right shoulder, not specified as traumatic: Secondary | ICD-10-CM | POA: Diagnosis present

## 2020-10-05 DIAGNOSIS — M25511 Pain in right shoulder: Secondary | ICD-10-CM

## 2020-10-05 DIAGNOSIS — G894 Chronic pain syndrome: Secondary | ICD-10-CM | POA: Diagnosis present

## 2020-10-22 ENCOUNTER — Other Ambulatory Visit: Payer: Self-pay | Admitting: Internal Medicine

## 2020-10-22 DIAGNOSIS — Z1231 Encounter for screening mammogram for malignant neoplasm of breast: Secondary | ICD-10-CM

## 2020-10-24 ENCOUNTER — Ambulatory Visit: Payer: Medicare Other | Admitting: Student in an Organized Health Care Education/Training Program

## 2020-11-06 ENCOUNTER — Encounter: Payer: Medicare Other | Admitting: Student in an Organized Health Care Education/Training Program

## 2020-11-19 ENCOUNTER — Ambulatory Visit: Payer: Medicare Other | Admitting: Student in an Organized Health Care Education/Training Program

## 2020-11-27 ENCOUNTER — Encounter: Payer: Medicare Other | Admitting: Student in an Organized Health Care Education/Training Program

## 2020-11-27 ENCOUNTER — Other Ambulatory Visit: Payer: Self-pay | Admitting: Internal Medicine

## 2020-11-27 DIAGNOSIS — R0602 Shortness of breath: Secondary | ICD-10-CM

## 2020-12-07 ENCOUNTER — Other Ambulatory Visit: Payer: Self-pay | Admitting: Student in an Organized Health Care Education/Training Program

## 2020-12-07 DIAGNOSIS — G8929 Other chronic pain: Secondary | ICD-10-CM

## 2020-12-07 DIAGNOSIS — G894 Chronic pain syndrome: Secondary | ICD-10-CM

## 2020-12-07 DIAGNOSIS — M67912 Unspecified disorder of synovium and tendon, left shoulder: Secondary | ICD-10-CM

## 2020-12-07 DIAGNOSIS — M19012 Primary osteoarthritis, left shoulder: Secondary | ICD-10-CM

## 2020-12-07 DIAGNOSIS — M5137 Other intervertebral disc degeneration, lumbosacral region: Secondary | ICD-10-CM

## 2020-12-07 DIAGNOSIS — M47816 Spondylosis without myelopathy or radiculopathy, lumbar region: Secondary | ICD-10-CM

## 2020-12-11 ENCOUNTER — Ambulatory Visit
Admission: RE | Admit: 2020-12-11 | Discharge: 2020-12-11 | Disposition: A | Discharge status: Home / Self Care | Payer: Medicare Other | Source: Ambulatory Visit | Attending: Internal Medicine | Admitting: Internal Medicine

## 2020-12-11 ENCOUNTER — Other Ambulatory Visit: Payer: Self-pay

## 2020-12-11 DIAGNOSIS — R0602 Shortness of breath: Secondary | ICD-10-CM | POA: Diagnosis present

## 2020-12-11 HISTORY — DX: Malignant melanoma of skin, unspecified: C43.9

## 2020-12-11 LAB — POCT I-STAT CREATININE: Creatinine, Ser: 1.2 mg/dL — ABNORMAL HIGH (ref 0.44–1.00)

## 2020-12-11 MED ORDER — IOHEXOL 300 MG/ML  SOLN
75.0000 mL | Freq: Once | INTRAMUSCULAR | Status: AC | PRN
  Administered 2020-12-11: 75 mL via INTRAVENOUS

## 2020-12-25 ENCOUNTER — Ambulatory Visit
Payer: Medicare Other | Attending: Student in an Organized Health Care Education/Training Program | Admitting: Student in an Organized Health Care Education/Training Program

## 2020-12-25 ENCOUNTER — Other Ambulatory Visit: Payer: Self-pay

## 2020-12-25 ENCOUNTER — Encounter: Payer: Self-pay | Admitting: Student in an Organized Health Care Education/Training Program

## 2020-12-25 DIAGNOSIS — M47816 Spondylosis without myelopathy or radiculopathy, lumbar region: Secondary | ICD-10-CM

## 2020-12-25 DIAGNOSIS — G8929 Other chronic pain: Secondary | ICD-10-CM

## 2020-12-25 DIAGNOSIS — M19012 Primary osteoarthritis, left shoulder: Secondary | ICD-10-CM | POA: Diagnosis present

## 2020-12-25 DIAGNOSIS — M5137 Other intervertebral disc degeneration, lumbosacral region: Secondary | ICD-10-CM | POA: Insufficient documentation

## 2020-12-25 DIAGNOSIS — M67912 Unspecified disorder of synovium and tendon, left shoulder: Secondary | ICD-10-CM | POA: Diagnosis not present

## 2020-12-25 DIAGNOSIS — M25512 Pain in left shoulder: Secondary | ICD-10-CM

## 2020-12-25 DIAGNOSIS — M51379 Other intervertebral disc degeneration, lumbosacral region without mention of lumbar back pain or lower extremity pain: Secondary | ICD-10-CM

## 2020-12-25 DIAGNOSIS — M25561 Pain in right knee: Secondary | ICD-10-CM | POA: Diagnosis present

## 2020-12-25 DIAGNOSIS — G894 Chronic pain syndrome: Secondary | ICD-10-CM

## 2020-12-25 MED ORDER — OXYCODONE-ACETAMINOPHEN 5-325 MG PO TABS: 1.0000 | ORAL_TABLET | Freq: Every day | ORAL | 0 refills | Status: AC | PRN

## 2020-12-25 MED ORDER — TIZANIDINE HCL 4 MG PO TABS: 4.0000 mg | ORAL_TABLET | Freq: Two times a day (BID) | ORAL | 2 refills | Status: AC | PRN

## 2020-12-25 MED ORDER — PREGABALIN 50 MG PO CAPS: ORAL_CAPSULE | ORAL | 0 refills | Status: DC

## 2020-12-25 NOTE — Progress Notes (Signed)
Nursing Pain Medication Assessment:  Safety precautions to be maintained throughout the outpatient stay will include: orient to surroundings, keep bed in low position, maintain call bell within reach at all times, provide assistance with transfer out of bed and ambulation.  Medication Inspection Compliance: Pill count conducted under aseptic conditions, in front of the patient. Neither the pills nor the bottle was removed from the patient's sight at any time. Once count was completed pills were immediately returned to the patient in their original bottle.  Medication: Oxycodone/APAP Pill/Patch Count: 14 of 30 pills remain Pill/Patch Appearance: Markings consistent with prescribed medication Bottle Appearance: Standard pharmacy container. Clearly labeled. Filled Date: 09 / 03/ 2022 Last Medication intake:  3 days ago

## 2020-12-25 NOTE — Progress Notes (Signed)
PROVIDER NOTE: Information contained herein reflects review and annotations entered in association with encounter. Interpretation of such information and data should be left to medically-trained personnel. Information provided to patient can be located elsewhere in the medical record under "Patient Instructions". Document created using STT-dictation technology, any transcriptional errors that may result from process are unintentional.    Patient: Teresa [name]  Service Category: E/M  Provider: Gillis Santa, MD  DOB: [birth year]  DOS: 12/25/2020  Specialty: Interventional Pain Management  MRN: [id number]  Setting: Ambulatory outpatient  PCP: Teresa Harrier, MD  Type: Established Patient    Referring Provider: Tracie Harrier, MD  Location: Office  Delivery: Face-to-face     HPI  Teresa [name], a 65 y.o. year old female, is here today because of her No primary diagnosis found.. Teresa [name] primary complain today is Back Pain (low) Last encounter: My last encounter with her was on 12/07/2020. Pertinent problems: Teresa [name] has INSOMNIA, CHRONIC; DEPRESSION; BACK PAIN, CHRONIC; Lumbar radiculopathy (right L5); Lumbar facet arthropathy; Degeneration of lumbar or lumbosacral intervertebral disc; Chronic pain syndrome; Arthritis of left glenohumeral joint; and Left rotator cuff tear arthropathy on their pertinent problem list. Pain Assessment: Severity of Chronic pain is reported as a 3 /10. Location: Back Lower/right leg goes numb. Onset: More than a month ago. Quality: Aching. Timing: Constant. Modifying factor(s): lying down. Vitals:  height is '5\' 6"'  (1.676 m) and weight is 135 lb (61.2 kg). Her temperature is 97.2 F (36.2 C) (abnormal). Her blood pressure is 131/67 and her pulse is 110 (abnormal). Her respiration is 16 and oxygen saturation is 100%.   Reason for encounter: medication management.    Overall doing well. Notices overall improvement in low back pain since doing lumbar medial  branch peripheral nerve stimulation Has been dealing with anemia and fatigue Has a cardiac echo scheduled.  CT of chest done.  Unremarkable. Discussed transitioning from Gabapentin 600 mg BID to Lyrica 50 mg BID and then titrate to 100 mg BID.  Pharmacotherapy Assessment   08/30/2020  08/30/2020   1  Oxycodone-Acetaminophen 5-325  30.00  30  Bi Lat  [id number]  Nor (0921)  0/0  7.50 MME  Medicare     Analgesic: Oxycodone 5 mg daily PRN breakthrough pain  Monitoring: [place] PMP: PDMP not reviewed this encounter.       Pharmacotherapy: No side-effects or adverse reactions reported. Compliance: No problems identified. Effectiveness: Clinically acceptable.  Teresa Shorter, RN  12/25/2020  8:11 AM  Sign when Signing Visit Nursing Pain Medication Assessment:  Safety precautions to be maintained throughout the outpatient stay will include: orient to surroundings, keep bed in low position, maintain call bell within reach at all times, provide assistance with transfer out of bed and ambulation.  Medication Inspection Compliance: Pill count conducted under aseptic conditions, in front of the patient. Neither the pills nor the bottle was removed from the patient's sight at any time. Once count was completed pills were immediately returned to the patient in their original bottle.  Medication: Oxycodone/APAP Pill/Patch Count: 14 of 30 pills remain Pill/Patch Appearance: Markings consistent with prescribed medication Bottle Appearance: Standard pharmacy container. Clearly labeled. Filled Date: 09 / 03/ 2022 Last Medication intake:  3 days ago    UDS:  Summary  Date Value Ref Range Status  04/03/2020 Note  Final    Comment:    ==================================================================== [employer] Select 13 (MW) ==================================================================== Test  Result       Flag       Units Drug Absent but Declared for Prescription  Verification   Oxycodone                      Not Detected UNEXPECTED ng/mg creat ==================================================================== Test                      Result    Flag   Units      Ref Range   Creatinine              194              mg/dL      >=20 ==================================================================== Declared Medications:  The flagging and interpretation on this report are based on the  following declared medications.  Unexpected results may arise from  inaccuracies in the declared medications.  **Note: The testing scope of this panel includes these medications:  Oxycodone  **Note: The testing scope of this panel does not include the  following reported medications:  Acetaminophen  Alendronate (Fosamax)  Buspirone (Buspar)  Calcium  Cholecalciferol  Gabapentin (Neurontin)  Iron (Fergon)  Multivitamin  Nortriptyline (Pamelor)  Tizanidine (Zanaflex)  Topical  Topical Diclofenac  Topiramate (Topamax)  Trazodone (Desyrel)  Zolpidem (Ambien) ==================================================================== For clinical consultation, please call [phone]. ====================================================================      ROS  Constitutional: fatigue  Gastrointestinal: No reported hemesis, hematochezia, vomiting, or acute GI distress Musculoskeletal: +low back pain Neurological: No reported episodes of acute onset apraxia, aphasia, dysarthria, agnosia, amnesia, paralysis, loss of coordination, or loss of consciousness  Medication Review  Calcium, Cholecalciferol, alendronate, ferrous gluconate, isosorbide mononitrate, multivitamin, nortriptyline, oxyCODONE-acetaminophen, pregabalin, tiZANidine, topiramate, traZODone, and zolpidem  History Review  Allergy: Teresa [name] is allergic to elastic bandages & [zinc], codeine, and morphine. Drug: Teresa [name]  reports no history of drug use. Alcohol:  reports no history of alcohol  use. Tobacco:  reports that she has never smoked. She has never used smokeless tobacco. Social: Teresa [name]  reports that she has never smoked. She has never used smokeless tobacco. She reports that she does not drink alcohol and does not use drugs. Medical:  has a past medical history of Headache, History of fractured vertebra, Hyperlipidemia, Low bone density, Melanoma ([place]), and Transfusion of blood product refused for religious reason. Surgical: Teresa [name]  has a past surgical history that includes Abdominal hysterectomy; Colonoscopy (2000); Eye surgery (Left, 2014); and Artery Biopsy (Left, 12/18/2015). Family: family history includes Dementia in her mother; Heart attack in her father.  Laboratory Chemistry Profile   Renal Lab Results  Component Value Date   BUN 11 08/14/2014   CREATININE 1.20 (H) 12/11/2020   GFRAA >90 08/14/2014   GFRNONAA 89 (L) 08/14/2014     Hepatic No results found for: AST, ALT, ALBUMIN, ALKPHOS, HCVAB, AMYLASE, LIPASE, AMMONIA   Electrolytes Lab Results  Component Value Date   NA 139 08/14/2014   K 4.0 08/14/2014   CL 99 08/14/2014   CALCIUM 10.2 08/14/2014     Bone No results found for: VD25OH, VD125OH2TOT, EL3810FB5, ZW2585ID7, 25OHVITD1, 25OHVITD2, 25OHVITD3, TESTOFREE, TESTOSTERONE   Inflammation (CRP: Acute Phase) (ESR: Chronic Phase) No results found for: CRP, ESRSEDRATE, LATICACIDVEN     Note: Above Lab results reviewed.  Recent Imaging Review  CT CHEST W CONTRAST CLINICAL DATA:  Worsening shortness of breath on exertion. Left mid chest pain.  EXAM: CT CHEST WITH CONTRAST  TECHNIQUE: Multidetector CT  imaging of the chest was performed during intravenous contrast administration.  CONTRAST:  67m OMNIPAQUE IOHEXOL 300 MG/ML  SOLN  COMPARISON:  None.  FINDINGS: Cardiovascular: The heart size is normal. No substantial pericardial effusion. Coronary artery calcification is evident. Atherosclerotic calcification is noted in the wall  of the thoracic aorta.  Mediastinum/Nodes: No mediastinal lymphadenopathy. There is no hilar lymphadenopathy. The esophagus has normal imaging features. There is no axillary lymphadenopathy.  Lungs/Pleura: No suspicious pulmonary nodule or mass. No focal airspace consolidation. No pleural effusion.  Upper Abdomen: Unremarkable.  Musculoskeletal: No worrisome lytic or sclerotic osseous abnormality. Mild wedge compression deformity noted at T4 and T8, stable since MRI 02/25/2018.  IMPRESSION: 1. No acute findings in the chest. Specifically, no findings to explain the patient's history of shortness of breath. 2. Aortic Atherosclerosis (ICD10-I70.0).  Electronically Signed   By: EMisty StanleyM.D.   On: 12/12/2020 09:15 Note: Reviewed        Physical Exam  General appearance: Well nourished, well developed, and well hydrated. In no apparent acute distress Mental status: Alert, oriented x 3 (person, place, & time)       Respiratory: No evidence of acute respiratory distress Eyes: PERLA Vitals: BP 131/67   Pulse (!) 110   Temp (!) 97.2 F (36.2 C)   Resp 16   Ht '5\' 6"'  (1.676 m)   Wt 135 lb (61.2 kg)   SpO2 100%   BMI 21.79 kg/m  BMI: Estimated body mass index is 21.79 kg/m as calculated from the following:   Height as of this encounter: '5\' 6"'  (1.676 m).   Weight as of this encounter: 135 lb (61.2 kg). Ideal: Ideal body weight: 59.3 kg (130 lb 11.7 oz) Adjusted ideal body weight: 60.1 kg (132 lb 7 oz)  Lumbar Spine Area Exam  Skin & Axial Inspection: No masses, redness, or swelling Alignment: Symmetrical Functional ROM: Pain restricted ROM       Stability: No instability detected Muscle Tone/Strength: Functionally intact. No obvious neuro-muscular anomalies detected. Sensory (Neurological): Musculoskeletal pain pattern  Lower Extremity Exam    Side: Right lower extremity  Side: Left lower extremity  Stability: No instability observed          Stability: No  instability observed          Skin & Extremity Inspection: Skin color, temperature, and hair growth are WNL. No peripheral edema or cyanosis. No masses, redness, swelling, asymmetry, or associated skin lesions. No contractures.  Skin & Extremity Inspection: Skin color, temperature, and hair growth are WNL. No peripheral edema or cyanosis. No masses, redness, swelling, asymmetry, or associated skin lesions. No contractures.  Functional ROM: Unrestricted ROM                  Functional ROM: Unrestricted ROM                  Muscle Tone/Strength: Functionally intact. No obvious neuro-muscular anomalies detected.  Muscle Tone/Strength: Functionally intact. No obvious neuro-muscular anomalies detected.  Sensory (Neurological): Musculoskeletal pain pattern        Sensory (Neurological): Musculoskeletal pain pattern        DTR: Patellar: deferred today Achilles: deferred today Plantar: deferred today  DTR: Patellar: deferred today Achilles: deferred today Plantar: deferred today  Palpation: No palpable anomalies  Palpation: No palpable anomalies    Assessment   Status Diagnosis  Controlled Controlled Controlled 1. Lumbar spondylosis   2. Lumbar facet arthropathy (L3/4 and L4/5)   3. Degeneration of lumbar  or lumbosacral intervertebral disc   4. Dysfunction of left rotator cuff   5. Chronic left shoulder pain   6. Chronic pain syndrome   7. Chronic pain of right knee   8. Arthritis of left glenohumeral joint       Plan of Care   Teresa [name] has a current medication list which includes the following long-term medication(s): ferrous gluconate, isosorbide mononitrate, pregabalin, topiramate, trazodone, and zolpidem.  Pharmacotherapy (Medications Ordered): Meds ordered this encounter  Medications  . tiZANidine (ZANAFLEX) 4 MG tablet    Sig: Take 1 tablet (4 mg total) by mouth 2 (two) times daily as needed for muscle spasms.    Dispense:  60 tablet    Refill:  2  .  oxyCODONE-acetaminophen (PERCOCET/ROXICET) 5-325 MG tablet    Sig: Take 1-2 tablets by mouth daily as needed for severe pain.    Dispense:  30 tablet    Refill:  0  . pregabalin (LYRICA) 50 MG capsule    Sig: Take 1 capsule (50 mg total) by mouth 2 (two) times daily for 10 days, THEN 2 capsules (100 mg total) 2 (two) times daily.    Dispense:  220 capsule    Refill:  0    Fill one day early if pharmacy is closed on scheduled refill date. May substitute for generic if available.    Follow-up plan:   Return in about 8 weeks (around 02/19/2021) for Medication Management, in person.     Status post left L3, L4, L5, S1 RFA on 06/15/2019, R L3,4,5, S1 RFA on 06/29/2019, left posterior glenohumeral shoulder steroid injection 01/02/2020: Not effective.  Left L4 and Sprint peripheral nerve stimulation on 04/25/2020, removed 06/11/2020.          Recent Visits Date Type Provider Dept  10/04/20 Telemedicine Gillis Santa, MD [facility]  Showing recent visits within past 90 days and meeting all other requirements Today's Visits Date Type Provider Dept  12/25/20 Office Visit Gillis Santa, MD [facility]  Showing today's visits and meeting all other requirements Future Appointments No visits were found meeting these conditions. Showing future appointments within next 90 days and meeting all other requirements  I discussed the assessment and treatment plan with the patient. The patient was provided an opportunity to ask questions and all were answered. The patient agreed with the plan and demonstrated an understanding of the instructions.  Patient advised to call back or seek an in-person evaluation if the symptoms or condition worsens.  Duration of encounter: 30 minutes.  Note by: Gillis Santa, MD Date: 12/25/2020; Time: 9:24 AM

## 2021-03-28 ENCOUNTER — Other Ambulatory Visit: Payer: Self-pay | Admitting: Student in an Organized Health Care Education/Training Program

## 2021-03-28 DIAGNOSIS — M47816 Spondylosis without myelopathy or radiculopathy, lumbar region: Secondary | ICD-10-CM

## 2021-03-28 DIAGNOSIS — M19012 Primary osteoarthritis, left shoulder: Secondary | ICD-10-CM

## 2021-03-28 DIAGNOSIS — G8929 Other chronic pain: Secondary | ICD-10-CM

## 2021-03-28 DIAGNOSIS — G894 Chronic pain syndrome: Secondary | ICD-10-CM

## 2021-03-28 DIAGNOSIS — M67912 Unspecified disorder of synovium and tendon, left shoulder: Secondary | ICD-10-CM

## 2021-03-28 DIAGNOSIS — M5137 Other intervertebral disc degeneration, lumbosacral region: Secondary | ICD-10-CM

## 2021-05-23 ENCOUNTER — Other Ambulatory Visit: Payer: Self-pay | Admitting: Internal Medicine

## 2021-05-23 DIAGNOSIS — R140 Abdominal distension (gaseous): Secondary | ICD-10-CM

## 2021-05-23 DIAGNOSIS — R1011 Right upper quadrant pain: Secondary | ICD-10-CM

## 2021-05-23 DIAGNOSIS — R112 Nausea with vomiting, unspecified: Secondary | ICD-10-CM

## 2021-05-24 ENCOUNTER — Other Ambulatory Visit: Payer: Self-pay

## 2021-05-24 ENCOUNTER — Ambulatory Visit
Admission: RE | Admit: 2021-05-24 | Discharge: 2021-05-24 | Disposition: A | Discharge status: Home / Self Care | Payer: Medicare Other | Source: Ambulatory Visit | Attending: Internal Medicine | Admitting: Internal Medicine

## 2021-05-24 DIAGNOSIS — R140 Abdominal distension (gaseous): Secondary | ICD-10-CM | POA: Insufficient documentation

## 2021-05-24 DIAGNOSIS — R112 Nausea with vomiting, unspecified: Secondary | ICD-10-CM | POA: Diagnosis present

## 2021-05-24 DIAGNOSIS — R197 Diarrhea, unspecified: Secondary | ICD-10-CM | POA: Insufficient documentation

## 2021-05-24 DIAGNOSIS — R1011 Right upper quadrant pain: Secondary | ICD-10-CM | POA: Insufficient documentation

## 2021-05-28 ENCOUNTER — Ambulatory Visit: Payer: Self-pay | Admitting: Surgery

## 2021-05-28 NOTE — H&P (Signed)
Subjective:    CC: Acute cholecystitis [K81.0]   HPI:  Teresa [name] is a 65 y.o. female who was referred by Lisa Roca* for evaluation of above CC. Symptoms were first noted a few days ago. Pain is sharp, radiating from the right upper quadrant, to the upper back.  Associated with nausea, exacerbated by nothing specific.     Past Medical History:  has a past medical history of Anemia (12-2015), Anxiety, DDD (degenerative disc disease), lumbar, Esophageal reflux, Essential (primary) hypertension, Headache, migraine, Hyperlipidemia, mixed, Migraines, Osteoporosis, post-menopausal, and Senile nuclear sclerosis.   Past Surgical History:  has a past surgical history that includes lens eye surgery (Left, OS: 02/26/13); hysterectomy (1999); Biopsy Temporal Artery (01/2016); Cataract extraction (2019); and Hysterectomy (1997).   Family History: family history includes Dementia in her mother; Glaucoma in her father; Myocardial Infarction (Heart attack) in her father; Osteoarthritis in her mother; Osteoporosis (Thinning of bones) in her mother.   Social History:  reports that she has never smoked. She has never used smokeless tobacco. She reports that she does not drink alcohol and does not use drugs.   Current Medications: has a current medication list which includes the following prescription(s): alendronate, amoxicillin-clavulanate, ascorbic acid (vitamin c), bupropion, butalbital-acetaminophen-caffeine, calcium carbonate-vitamin d3, cyanocobalamin, diclofenac, dicyclomine, doxepin, ferrous gluconate, gabapentin, isosorbide mononitrate, magnesium oxide, multivitamin, nortriptyline, ondansetron, oxycodone-acetaminophen, tizanidine, topiramate, and zolpidem.   Allergies:       Allergies as of 05/27/2021 - Reviewed 05/27/2021  Allergen Reaction Noted   Zinc Rash 01/02/2020   Codeine Other (See Comments)     Morphine Other (See Comments)        ROS:  A 15 point review of systems was  performed and pertinent positives and negatives noted in HPI    Objective:    BP 134/72   Pulse 104   Ht 167.6 cm ('5\' 6"'$ )   Wt 59 kg (130 lb)   LMP  (LMP Unknown)   BMI 20.98 kg/m      Constitutional :  alert, appears stated age, cooperative and no distress  Lymphatics/Throat:  no asymmetry, masses, or scars  Respiratory:  clear to auscultation bilaterally  Cardiovascular:  regular rate and rhythm  Gastrointestinal: soft no guarding, some TTP in RUQ.    Musculoskeletal: Steady gait and movement  Skin: Cool and moist  Psychiatric: Normal affect, non-agitated, not confused         LABS:  n/a    RADS: CLINICAL DATA:  Abdominal pain, RUQ   EXAM:  ULTRASOUND ABDOMEN LIMITED RIGHT UPPER QUADRANT   COMPARISON:  None.   FINDINGS:  Gallbladder:   There are multiple shadowing gallstones. The gallbladder is  distended, but without any wall thickening or pericholecystic fluid.  Negative sonographic Murphy sign. Largest stone measures 5 mm.   Common bile duct:   Diameter: 5 mm   Liver:   No focal lesion identified. Within normal limits in parenchymal  echogenicity. Portal vein is patent on color Doppler imaging with  normal direction of blood flow towards the liver.   Other: None.   IMPRESSION:  Distended gallbladder with multiple intraluminal gallstones. Largest  stone measures 5 mm. No gallbladder wall thickening or  pericholecystic fluid. Negative sonographic Murphy sign.    Electronically Signed    By: Maurine Simmering    On: 05/24/2021 08:04   Assessment:       Acute cholecystitis [K81.0]   Plan:    1. Acute cholecystitis [K81.0] Discussed the risk of surgery including  post-op infxn, seroma, biloma, chronic pain, poor-delayed wound healing, retained gallstone, conversion to open procedure, post-op SBO or ileus, and need for additional procedures to address said risks.  The risks of general anesthetic including MI, CVA, sudden death or even reaction to  anesthetic medications also discussed. Alternatives include continued observation.  Benefits include possible symptom relief, prevention of complications including acute cholecystitis, pancreatitis.   Typical post operative recovery of 3-5 days rest, continued pain in area and incision sites, possible loose stools up to 4-6 weeks, also discussed.   ED return precautions given for sudden increase in RUQ pain, with possible accompanying fever, nausea, and/or vomiting.   The patient understands the risks, any and all questions were answered to the patient's satisfaction.   2. Patient has elected to proceed with surgical treatment. Procedure will be scheduled.  Written consent was obtained.robotic assisted laparoscopic.  Pt declined admission prior to surgery in the next couple days.  Started abx in the meantime and strict return precautions discussed due to acute nature.  They verbalized understanding [facility], LABS IN AM

## 2021-05-28 NOTE — H&P (View-Only) (Signed)
Subjective:    CC: Acute cholecystitis [K81.0]   HPI:  Teresa [name] is a 65 y.o. female who was referred by Lisa Roca* for evaluation of above CC. Symptoms were first noted a few days ago. Pain is sharp, radiating from the right upper quadrant, to the upper back.  Associated with nausea, exacerbated by nothing specific.     Past Medical History:  has a past medical history of Anemia (12-2015), Anxiety, DDD (degenerative disc disease), lumbar, Esophageal reflux, Essential (primary) hypertension, Headache, migraine, Hyperlipidemia, mixed, Migraines, Osteoporosis, post-menopausal, and Senile nuclear sclerosis.   Past Surgical History:  has a past surgical history that includes lens eye surgery (Left, OS: 02/26/13); hysterectomy (1999); Biopsy Temporal Artery (01/2016); Cataract extraction (2019); and Hysterectomy (1997).   Family History: family history includes Dementia in her mother; Glaucoma in her father; Myocardial Infarction (Heart attack) in her father; Osteoarthritis in her mother; Osteoporosis (Thinning of bones) in her mother.   Social History:  reports that she has never smoked. She has never used smokeless tobacco. She reports that she does not drink alcohol and does not use drugs.   Current Medications: has a current medication list which includes the following prescription(s): alendronate, amoxicillin-clavulanate, ascorbic acid (vitamin c), bupropion, butalbital-acetaminophen-caffeine, calcium carbonate-vitamin d3, cyanocobalamin, diclofenac, dicyclomine, doxepin, ferrous gluconate, gabapentin, isosorbide mononitrate, magnesium oxide, multivitamin, nortriptyline, ondansetron, oxycodone-acetaminophen, tizanidine, topiramate, and zolpidem.   Allergies:       Allergies as of 05/27/2021 - Reviewed 05/27/2021  Allergen Reaction Noted   Zinc Rash 01/02/2020   Codeine Other (See Comments)     Morphine Other (See Comments)        ROS:  A 15 point review of systems was  performed and pertinent positives and negatives noted in HPI    Objective:    BP 134/72   Pulse 104   Ht 167.6 cm ('5\' 6"'$ )   Wt 59 kg (130 lb)   LMP  (LMP Unknown)   BMI 20.98 kg/m      Constitutional :  alert, appears stated age, cooperative and no distress  Lymphatics/Throat:  no asymmetry, masses, or scars  Respiratory:  clear to auscultation bilaterally  Cardiovascular:  regular rate and rhythm  Gastrointestinal: soft no guarding, some TTP in RUQ.    Musculoskeletal: Steady gait and movement  Skin: Cool and moist  Psychiatric: Normal affect, non-agitated, not confused         LABS:  n/a    RADS: CLINICAL DATA:  Abdominal pain, RUQ   EXAM:  ULTRASOUND ABDOMEN LIMITED RIGHT UPPER QUADRANT   COMPARISON:  None.   FINDINGS:  Gallbladder:   There are multiple shadowing gallstones. The gallbladder is  distended, but without any wall thickening or pericholecystic fluid.  Negative sonographic Murphy sign. Largest stone measures 5 mm.   Common bile duct:   Diameter: 5 mm   Liver:   No focal lesion identified. Within normal limits in parenchymal  echogenicity. Portal vein is patent on color Doppler imaging with  normal direction of blood flow towards the liver.   Other: None.   IMPRESSION:  Distended gallbladder with multiple intraluminal gallstones. Largest  stone measures 5 mm. No gallbladder wall thickening or  pericholecystic fluid. Negative sonographic Murphy sign.    Electronically Signed    By: Maurine Simmering    On: 05/24/2021 08:04   Assessment:       Acute cholecystitis [K81.0]   Plan:    1. Acute cholecystitis [K81.0] Discussed the risk of surgery including  post-op infxn, seroma, biloma, chronic pain, poor-delayed wound healing, retained gallstone, conversion to open procedure, post-op SBO or ileus, and need for additional procedures to address said risks.  The risks of general anesthetic including MI, CVA, sudden death or even reaction to  anesthetic medications also discussed. Alternatives include continued observation.  Benefits include possible symptom relief, prevention of complications including acute cholecystitis, pancreatitis.   Typical post operative recovery of 3-5 days rest, continued pain in area and incision sites, possible loose stools up to 4-6 weeks, also discussed.   ED return precautions given for sudden increase in RUQ pain, with possible accompanying fever, nausea, and/or vomiting.   The patient understands the risks, any and all questions were answered to the patient's satisfaction.   2. Patient has elected to proceed with surgical treatment. Procedure will be scheduled.  Written consent was obtained.robotic assisted laparoscopic.  Pt declined admission prior to surgery in the next couple days.  Started abx in the meantime and strict return precautions discussed due to acute nature.  They verbalized understanding [facility], LABS IN AM

## 2021-05-29 ENCOUNTER — Other Ambulatory Visit: Payer: Self-pay

## 2021-05-29 ENCOUNTER — Other Ambulatory Visit
Admission: RE | Admit: 2021-05-29 | Discharge: 2021-05-29 | Disposition: A | Discharge status: Home / Self Care | Payer: Medicare Other | Source: Ambulatory Visit | Attending: Surgery | Admitting: Surgery

## 2021-05-29 HISTORY — DX: Dyspnea, unspecified: R06.00

## 2021-05-29 NOTE — Patient Instructions (Signed)
Your procedure is scheduled on: Thursday May 30, 2021. Report to Day Surgery inside [facility] 2nd floor stop by admissions desk first before getting on elevator. To find out your arrival time please call [phone] between 1PM - 3PM on Wednesday May 29, 2021.  Remember: Instructions that are not followed completely may result in serious medical risk,  up to and including death, or upon the discretion of your surgeon and anesthesiologist your  surgery may need to be rescheduled.     _X__ 1. Do not eat food after midnight the night before your procedure.                 No chewing gum or hard candies. You may drink clear liquids up to 2 hours                 before you are scheduled to arrive for your surgery- DO not drink clear                 liquids within 2 hours of the start of your surgery.                 Clear Liquids include:  water, apple juice without pulp, clear Gatorade, G2 or                  Gatorade Zero (avoid Red/Purple/Blue), Black Coffee or Tea (Do not add                 anything to coffee or tea).  __X__2.  On the morning of surgery brush your teeth with toothpaste and water, you                may rinse your mouth with mouthwash if you wish.  Do not swallow any toothpaste of mouthwash.     _X__ 3.  No Alcohol for 24 hours before or after surgery.   _X__ 4.  Do Not Smoke or use e-cigarettes For 24 Hours Prior to Your Surgery.                 Do not use any chewable tobacco products for at least 6 hours prior to                 Surgery.  _X__  5.  Do not use any recreational drugs (marijuana, cocaine, heroin, ecstasy, MDMA or other)                For at least one week prior to your surgery.  Combination of these drugs with anesthesia                May have life threatening results.  __X__6.  Notify your doctor if there is any change in your medical condition      (cold, fever, infections).     Do not wear jewelry, make-up, hairpins,  clips or nail polish. Do not wear lotions, powders, or perfumes. You may wear deodorant. Do not shave 48 hours prior to surgery.  Do not bring valuables to the hospital.    [facility] is not responsible for any belongings or valuables.  Contacts, dentures or bridgework may not be worn into surgery. Leave your suitcase in the car. After surgery it may be brought to your room. For patients admitted to the hospital, discharge time is determined by your treatment team.   Patients discharged the day of surgery will not be allowed to drive home.   Make arrangements for  someone to be with you for the first 24 hours of your Same Day Discharge.   __X__ Take these medicines the morning of surgery with A SIP OF WATER:    1. None   2.   3.   4.  5.  6.  ____ Fleet Enema (as directed)   __X__ Use CHG Soap (or wipes) as directed  ____ Use Benzoyl Peroxide Gel as instructed  ____ Use inhalers on the day of surgery  ____ Stop metformin 2 days prior to surgery    ____ Take 1/2 of usual insulin dose the night before surgery. No insulin the morning          of surgery.   ____ Call your PCP, cardiologist, or Pulmonologist if taking Coumadin/Plavix/aspirin and ask when to stop before your surgery.   __X__ One Week prior to surgery- Stop Anti-inflammatories such as Ibuprofen, Aleve, Advil, Motrin, meloxicam (MOBIC), diclofenac, etodolac, ketorolac, Toradol, Daypro, piroxicam, Goody's or BC powders. OK TO USE TYLENOL IF NEEDED   __X__ Stop supplements until after surgery.    ____ Bring C-Pap to the hospital.    If you have any questions regarding your pre-procedure instructions,  Please call Pre-admit Testing at [phone].

## 2021-05-30 ENCOUNTER — Ambulatory Visit
Admission: RE | Admit: 2021-05-30 | Discharge: 2021-05-30 | Disposition: A | Discharge status: Home / Self Care | Payer: Medicare Other | Attending: Surgery | Admitting: Surgery

## 2021-05-30 ENCOUNTER — Ambulatory Visit: Payer: Medicare Other | Admitting: Certified Registered Nurse Anesthetist

## 2021-05-30 ENCOUNTER — Encounter: Payer: Self-pay | Admitting: Surgery

## 2021-05-30 ENCOUNTER — Other Ambulatory Visit: Payer: Self-pay

## 2021-05-30 ENCOUNTER — Encounter
Admission: RE | Disposition: A | Discharge status: Home / Self Care | Payer: Self-pay | Source: Home / Self Care | Attending: Surgery

## 2021-05-30 DIAGNOSIS — Z90710 Acquired absence of both cervix and uterus: Secondary | ICD-10-CM | POA: Diagnosis not present

## 2021-05-30 DIAGNOSIS — Z885 Allergy status to narcotic agent status: Secondary | ICD-10-CM | POA: Diagnosis not present

## 2021-05-30 DIAGNOSIS — E782 Mixed hyperlipidemia: Secondary | ICD-10-CM | POA: Diagnosis not present

## 2021-05-30 DIAGNOSIS — K811 Chronic cholecystitis: Secondary | ICD-10-CM | POA: Diagnosis present

## 2021-05-30 DIAGNOSIS — Z8249 Family history of ischemic heart disease and other diseases of the circulatory system: Secondary | ICD-10-CM | POA: Insufficient documentation

## 2021-05-30 DIAGNOSIS — I10 Essential (primary) hypertension: Secondary | ICD-10-CM | POA: Insufficient documentation

## 2021-05-30 DIAGNOSIS — K8012 Calculus of gallbladder with acute and chronic cholecystitis without obstruction: Secondary | ICD-10-CM | POA: Insufficient documentation

## 2021-05-30 SURGERY — CHOLECYSTECTOMY, ROBOT-ASSISTED, LAPAROSCOPIC
Anesthesia: General | Site: Abdomen

## 2021-05-30 MED ORDER — IBUPROFEN 800 MG PO TABS: 800.0000 mg | ORAL_TABLET | Freq: Three times a day (TID) | ORAL | 0 refills | Status: DC | PRN

## 2021-05-30 MED ORDER — CHLORHEXIDINE GLUCONATE 0.12 % MT SOLN
OROMUCOSAL | Status: AC
  Administered 2021-05-30: 15 mL via OROMUCOSAL
  Filled 2021-05-30: qty 15

## 2021-05-30 MED ORDER — SODIUM CHLORIDE 0.9 % IR SOLN
Status: DC | PRN
  Administered 2021-05-30: 500 mL

## 2021-05-30 MED ORDER — INDOCYANINE GREEN 25 MG IV SOLR
7.5000 mg | Freq: Once | INTRAVENOUS | Status: AC
  Administered 2021-05-30: 7.5 mg via INTRAVENOUS
  Filled 2021-05-30 (×2): qty 10

## 2021-05-30 MED ORDER — CHLORHEXIDINE GLUCONATE 0.12 % MT SOLN: 15.0000 mL | Freq: Once | OROMUCOSAL | Status: AC

## 2021-05-30 MED ORDER — PROPOFOL 10 MG/ML IV BOLUS
INTRAVENOUS | Status: AC
  Filled 2021-05-30: qty 20

## 2021-05-30 MED ORDER — MIDAZOLAM HCL 2 MG/2ML IJ SOLN
INTRAMUSCULAR | Status: AC
  Filled 2021-05-30: qty 2

## 2021-05-30 MED ORDER — DEXAMETHASONE SODIUM PHOSPHATE 10 MG/ML IJ SOLN
INTRAMUSCULAR | Status: DC | PRN
  Administered 2021-05-30: 10 mg via INTRAVENOUS

## 2021-05-30 MED ORDER — DOCUSATE SODIUM 100 MG PO CAPS: 100.0000 mg | ORAL_CAPSULE | Freq: Two times a day (BID) | ORAL | 0 refills | Status: AC | PRN

## 2021-05-30 MED ORDER — SODIUM CHLORIDE FLUSH 0.9 % IV SOLN
INTRAVENOUS | Status: AC
  Filled 2021-05-30: qty 10

## 2021-05-30 MED ORDER — FAMOTIDINE 20 MG PO TABS
ORAL_TABLET | ORAL | Status: AC
  Filled 2021-05-30: qty 1

## 2021-05-30 MED ORDER — ORAL CARE MOUTH RINSE: 15.0000 mL | Freq: Once | OROMUCOSAL | Status: AC

## 2021-05-30 MED ORDER — ROCURONIUM BROMIDE 100 MG/10ML IV SOLN
INTRAVENOUS | Status: DC | PRN
  Administered 2021-05-30: 20 mg via INTRAVENOUS
  Administered 2021-05-30: 40 mg via INTRAVENOUS

## 2021-05-30 MED ORDER — ONDANSETRON HCL 4 MG/2ML IJ SOLN
INTRAMUSCULAR | Status: DC | PRN
  Administered 2021-05-30: 4 mg via INTRAVENOUS

## 2021-05-30 MED ORDER — GABAPENTIN 300 MG PO CAPS: 300.0000 mg | ORAL_CAPSULE | ORAL | Status: AC

## 2021-05-30 MED ORDER — CELECOXIB 200 MG PO CAPS
200.0000 mg | ORAL_CAPSULE | ORAL | Status: AC
Start: 2021-05-31 — End: 2021-05-30

## 2021-05-30 MED ORDER — CHLORHEXIDINE GLUCONATE CLOTH 2 % EX PADS
6.0000 | MEDICATED_PAD | Freq: Once | CUTANEOUS | Status: AC
  Administered 2021-05-30: 6 via TOPICAL

## 2021-05-30 MED ORDER — FENTANYL CITRATE (PF) 100 MCG/2ML IJ SOLN
INTRAMUSCULAR | Status: AC
  Filled 2021-05-30: qty 2

## 2021-05-30 MED ORDER — 0.9 % SODIUM CHLORIDE (POUR BTL) OPTIME
TOPICAL | Status: DC | PRN
  Administered 2021-05-30: 500 mL

## 2021-05-30 MED ORDER — GABAPENTIN 300 MG PO CAPS
ORAL_CAPSULE | ORAL | Status: AC
  Administered 2021-05-30: 300 mg via ORAL
  Filled 2021-05-30: qty 1

## 2021-05-30 MED ORDER — CEFAZOLIN SODIUM-DEXTROSE 2-4 GM/100ML-% IV SOLN
INTRAVENOUS | Status: AC
  Filled 2021-05-30: qty 100

## 2021-05-30 MED ORDER — HYDROCODONE-ACETAMINOPHEN 7.5-325 MG PO TABS
1.0000 | ORAL_TABLET | Freq: Once | ORAL | Status: AC | PRN
  Administered 2021-05-30: 1 via ORAL

## 2021-05-30 MED ORDER — ALENDRONATE SODIUM 70 MG PO TABS: 70.0000 mg | ORAL_TABLET | ORAL | Status: DC

## 2021-05-30 MED ORDER — ACETAMINOPHEN 500 MG PO TABS
ORAL_TABLET | ORAL | Status: AC
  Administered 2021-05-30: 1000 mg via ORAL
  Filled 2021-05-30: qty 2

## 2021-05-30 MED ORDER — LIDOCAINE HCL (PF) 1 % IJ SOLN
INTRAMUSCULAR | Status: AC
  Filled 2021-05-30: qty 30

## 2021-05-30 MED ORDER — PROPOFOL 10 MG/ML IV BOLUS
INTRAVENOUS | Status: DC | PRN
  Administered 2021-05-30: 150 mg via INTRAVENOUS

## 2021-05-30 MED ORDER — PHENYLEPHRINE HCL (PRESSORS) 10 MG/ML IV SOLN
INTRAVENOUS | Status: DC | PRN
  Administered 2021-05-30: 100 ug via INTRAVENOUS

## 2021-05-30 MED ORDER — ONDANSETRON HCL 4 MG/2ML IJ SOLN
INTRAMUSCULAR | Status: AC
  Filled 2021-05-30: qty 2

## 2021-05-30 MED ORDER — LIDOCAINE HCL (CARDIAC) PF 100 MG/5ML IV SOSY
PREFILLED_SYRINGE | INTRAVENOUS | Status: DC | PRN
  Administered 2021-05-30: 60 mg via INTRAVENOUS

## 2021-05-30 MED ORDER — LACTATED RINGERS IV SOLN: INTRAVENOUS | Status: DC

## 2021-05-30 MED ORDER — SUGAMMADEX SODIUM 200 MG/2ML IV SOLN
INTRAVENOUS | Status: DC | PRN
  Administered 2021-05-30: 240 mg via INTRAVENOUS

## 2021-05-30 MED ORDER — HYDROCODONE-ACETAMINOPHEN 7.5-325 MG PO TABS
ORAL_TABLET | ORAL | Status: AC
  Filled 2021-05-30: qty 1

## 2021-05-30 MED ORDER — FENTANYL CITRATE (PF) 100 MCG/2ML IJ SOLN
25.0000 ug | INTRAMUSCULAR | Status: DC | PRN
  Administered 2021-05-30: 25 ug via INTRAVENOUS

## 2021-05-30 MED ORDER — ZOLPIDEM TARTRATE 10 MG PO TABS: 5.0000 mg | ORAL_TABLET | Freq: Every evening | ORAL | Status: DC | PRN

## 2021-05-30 MED ORDER — FAMOTIDINE 20 MG PO TABS
20.0000 mg | ORAL_TABLET | Freq: Once | ORAL | Status: DC
Start: 2021-05-30 — End: 2021-05-30

## 2021-05-30 MED ORDER — FENTANYL CITRATE (PF) 100 MCG/2ML IJ SOLN
INTRAMUSCULAR | Status: DC | PRN
  Administered 2021-05-30 (×2): 50 ug via INTRAVENOUS

## 2021-05-30 MED ORDER — BUPIVACAINE-EPINEPHRINE (PF) 0.5% -1:200000 IJ SOLN
INTRAMUSCULAR | Status: AC
  Filled 2021-05-30: qty 30

## 2021-05-30 MED ORDER — LIDOCAINE-EPINEPHRINE (PF) 1 %-1:200000 IJ SOLN
INTRAMUSCULAR | Status: DC | PRN
  Administered 2021-05-30: 18 mL via INTRAMUSCULAR

## 2021-05-30 MED ORDER — CELECOXIB 200 MG PO CAPS
ORAL_CAPSULE | ORAL | Status: AC
  Administered 2021-05-30: 200 mg via ORAL
  Filled 2021-05-30: qty 1

## 2021-05-30 MED ORDER — LIDOCAINE HCL (PF) 2 % IJ SOLN
INTRAMUSCULAR | Status: AC
  Filled 2021-05-30: qty 5

## 2021-05-30 MED ORDER — ACETAMINOPHEN 500 MG PO TABS: 1000.0000 mg | ORAL_TABLET | ORAL | Status: AC

## 2021-05-30 MED ORDER — MIDAZOLAM HCL 2 MG/2ML IJ SOLN
INTRAMUSCULAR | Status: DC | PRN
  Administered 2021-05-30: 2 mg via INTRAVENOUS

## 2021-05-30 MED ORDER — CEFAZOLIN SODIUM-DEXTROSE 2-4 GM/100ML-% IV SOLN
2.0000 g | INTRAVENOUS | Status: AC
  Administered 2021-05-30: 2 g via INTRAVENOUS

## 2021-05-30 SURGICAL SUPPLY — 53 items
ADH SKN CLS APL DERMABOND .7 (GAUZE/BANDAGES/DRESSINGS) ×2
ANCHOR TIS RET SYS 235ML (MISCELLANEOUS) ×3 IMPLANT
APL PRP STRL LF DISP 70% ISPRP (MISCELLANEOUS) ×2
BAG INFUSER PRESSURE 100CC (MISCELLANEOUS) ×2 IMPLANT
BAG TISS RTRVL C235 10X14 (MISCELLANEOUS) ×2
BLADE SURG SZ11 CARB STEEL (BLADE) ×3 IMPLANT
CANISTER SUCT 1200ML W/VALVE (MISCELLANEOUS) ×3 IMPLANT
CANNULA REDUC XI 12-8 STAPL (CANNULA) ×3
CANNULA REDUCER 12-8 DVNC XI (CANNULA) ×2 IMPLANT
CHLORAPREP W/TINT 26 (MISCELLANEOUS) ×3 IMPLANT
CLIP VESOLOCK MED LG 6/CT (CLIP) ×3 IMPLANT
DECANTER SPIKE VIAL GLASS SM (MISCELLANEOUS) ×6 IMPLANT
DEFOGGER SCOPE WARMER CLEARIFY (MISCELLANEOUS) ×3 IMPLANT
DERMABOND ADVANCED (GAUZE/BANDAGES/DRESSINGS) ×1
DERMABOND ADVANCED .7 DNX12 (GAUZE/BANDAGES/DRESSINGS) ×2 IMPLANT
DRAPE ARM DVNC X/XI (DISPOSABLE) ×8 IMPLANT
DRAPE COLUMN DVNC XI (DISPOSABLE) ×2 IMPLANT
DRAPE DA VINCI XI ARM (DISPOSABLE) ×12
DRAPE DA VINCI XI COLUMN (DISPOSABLE) ×3
ELECT CAUTERY BLADE 6.4 (BLADE) ×3 IMPLANT
ELECT REM PT RETURN 9FT ADLT (ELECTROSURGICAL) ×3
ELECTRODE REM PT RTRN 9FT ADLT (ELECTROSURGICAL) ×2 IMPLANT
GAUZE 4X4 16PLY ~~LOC~~+RFID DBL (SPONGE) ×3 IMPLANT
GLOVE SURG SYN 6.5 ES PF (GLOVE) ×6 IMPLANT
GLOVE SURG SYN 6.5 PF PI (GLOVE) ×2 IMPLANT
GLOVE SURG UNDER POLY LF SZ7 (GLOVE) ×6 IMPLANT
GOWN STRL REUS W/ TWL LRG LVL3 (GOWN DISPOSABLE) ×6 IMPLANT
GOWN STRL REUS W/TWL LRG LVL3 (GOWN DISPOSABLE) ×9
IRRIGATOR SUCT 8 DISP DVNC XI (IRRIGATION / IRRIGATOR) ×1 IMPLANT
IRRIGATOR SUCTION 8MM XI DISP (IRRIGATION / IRRIGATOR) ×3
IV NS 1000ML (IV SOLUTION) ×3
IV NS 1000ML BAXH (IV SOLUTION) ×2 IMPLANT
LABEL OR SOLS (LABEL) ×3 IMPLANT
MANIFOLD NEPTUNE II (INSTRUMENTS) ×3 IMPLANT
NDL INSUFFLATION 14GA 120MM (NEEDLE) ×1 IMPLANT
NEEDLE HYPO 22GX1.5 SAFETY (NEEDLE) ×3 IMPLANT
NEEDLE INSUFFLATION 14GA 120MM (NEEDLE) ×3 IMPLANT
NS IRRIG 500ML POUR BTL (IV SOLUTION) ×3 IMPLANT
OBTURATOR OPTICAL STANDARD 8MM (TROCAR) ×3
OBTURATOR OPTICAL STND 8 DVNC (TROCAR) ×2
OBTURATOR OPTICALSTD 8 DVNC (TROCAR) ×2 IMPLANT
PACK LAP CHOLECYSTECTOMY (MISCELLANEOUS) ×3 IMPLANT
PENCIL ELECTRO HAND CTR (MISCELLANEOUS) ×3 IMPLANT
SEAL CANN UNIV 5-8 DVNC XI (MISCELLANEOUS) ×6 IMPLANT
SEAL XI 5MM-8MM UNIVERSAL (MISCELLANEOUS) ×9
SET TUBE SMOKE EVAC HIGH FLOW (TUBING) ×3 IMPLANT
SOLUTION ELECTROLUBE (MISCELLANEOUS) ×3 IMPLANT
STAPLER CANNULA SEAL DVNC XI (STAPLE) ×2 IMPLANT
STAPLER CANNULA SEAL XI (STAPLE) ×3
SUT MNCRL 4-0 (SUTURE) ×6
SUT MNCRL 4-0 27XMFL (SUTURE) ×4
SUT VICRYL 0 AB UR-6 (SUTURE) ×3 IMPLANT
SUTURE MNCRL 4-0 27XMF (SUTURE) ×4 IMPLANT

## 2021-05-30 NOTE — Discharge Instructions (Addendum)
Laparoscopic Cholecystectomy, Care After This sheet gives you information about how to care for yourself after your procedure. Your doctor may also give you more specific instructions. If you have problems or questions, contact your doctor. Follow these instructions at home: Care for cuts from surgery (incisions)  Follow instructions from your doctor about how to take care of your cuts from surgery. Make sure you: Wash your hands with soap and water before you change your bandage (dressing). If you cannot use soap and water, use hand sanitizer. Change your bandage as told by your doctor. Leave stitches (sutures), skin glue, or skin tape (adhesive) strips in place. They may need to stay in place for 2 weeks or longer. If tape strips get loose and curl up, you may trim the loose edges. Do not remove tape strips completely unless your doctor says it is okay. Do not take baths, swim, or use a hot tub until your doctor says it is okay. OK TO SHOWER 24HRS AFTER YOUR SURGERY.  Check your surgical cut area every day for signs of infection. Check for: More redness, swelling, or pain. More fluid or blood. Warmth. Pus or a bad smell. Activity Do not drive or use heavy machinery while taking prescription pain medicine. Do not play contact sports until your doctor says it is okay. Do not drive for 24 hours if you were given a medicine to help you relax (sedative). Rest as needed. Do not return to work or school until your doctor says it is okay. General instructions RESUME EXCEDRIN IN 48HRS  tylenol and advil as needed for discomfort.  Please alternate between the two every four hours as needed for pain.    Use narcotics, if prescribed, only when tylenol and motrin is not enough to control pain.  325-'650mg'$  every 8hrs to max of '3000mg'$ /24hrs (including the '325mg'$  in every norco dose) for the tylenol.    Advil up to '800mg'$  per dose every 8hrs as needed for pain.   To prevent or treat constipation while you  are taking prescription pain medicine, your doctor may recommend that you: Drink enough fluid to keep your pee (urine) clear or pale yellow. Take over-the-counter or prescription medicines. Eat foods that are high in fiber, such as fresh fruits and vegetables, whole grains, and beans. Limit foods that are high in fat and processed sugars, such as fried and sweet foods. Contact a doctor if: You develop a rash. You have more redness, swelling, or pain around your surgical cuts. You have more fluid or blood coming from your surgical cuts. Your surgical cuts feel warm to the touch. You have pus or a bad smell coming from your surgical cuts. You have a fever. One or more of your surgical cuts breaks open. Get help right away if: You have trouble breathing. You have chest pain. You have pain that is getting worse in your shoulders. You faint or feel dizzy when you stand. You have very bad pain in your belly (abdomen). You are sick to your stomach (nauseous) for more than one day. You have throwing up (vomiting) that lasts for more than one day. You have leg pain. This information is not intended to replace advice given to you by your health care provider. Make sure you discuss any questions you have with your health care provider. Document Released: 07/29/2008 Document Revised: 05/10/2016 Document Reviewed: 04/07/2016 Elsevier Interactive Patient Education  2019 [place]   The drugs that you were given  will stay in your system until tomorrow so for the next 24 hours you should not:  Drive an automobile Make any legal decisions Drink any alcoholic beverage   You may resume regular meals tomorrow.  Today it is better to start with liquids and gradually work up to solid foods.  You may eat anything you prefer, but it is better to start with liquids, then soup and crackers, and gradually work up to solid foods.   Please notify your  doctor immediately if you have any unusual bleeding, trouble breathing, redness and pain at the surgery site, drainage, fever, or pain not relieved by medication.    Your post-operative visit with Dr.                                       is: Date:                        Time:    Please call to schedule your post-operative visit.  Additional Instructions:

## 2021-05-30 NOTE — Anesthesia Procedure Notes (Signed)
Procedure Name: Intubation Date/Time: 05/30/2021 1:48 PM Performed by: Tollie Eth, CRNA Pre-anesthesia Checklist: Patient identified, Patient being monitored, Timeout performed, Emergency Drugs available and Suction available Patient Re-evaluated:Patient Re-evaluated prior to induction Oxygen Delivery Method: Circle system utilized Preoxygenation: Pre-oxygenation with 100% oxygen Induction Type: IV induction Ventilation: Mask ventilation without difficulty Laryngoscope Size: Mac and 3 Grade View: Grade I Tube type: Oral Tube size: 7.0 mm Number of attempts: 1 Airway Equipment and Method: Stylet and Video-laryngoscopy Placement Confirmation: ETT inserted through vocal cords under direct vision, positive ETCO2 and breath sounds checked- equal and bilateral Secured at: 21 cm Tube secured with: Tape Dental Injury: Teeth and Oropharynx as per pre-operative assessment

## 2021-05-30 NOTE — Anesthesia Postprocedure Evaluation (Signed)
Anesthesia Post Note  Patient: Teresa [name]  Procedure(s) Performed: XI ROBOTIC ASSISTED LAPAROSCOPIC CHOLECYSTECTOMY (Abdomen)  Patient location during evaluation: PACU Anesthesia Type: General Level of consciousness: awake and alert Pain management: pain level controlled Vital Signs Assessment: post-procedure vital signs reviewed and stable Respiratory status: spontaneous breathing, nonlabored ventilation, respiratory function stable and patient connected to nasal cannula oxygen Cardiovascular status: blood pressure returned to baseline and stable Postop Assessment: no apparent nausea or vomiting Anesthetic complications: no   No notable events documented.   Last Vitals:  Vitals:   05/30/21 1545 05/30/21 1559  BP: 124/68 116/81  Pulse: 80 87  Resp: 15 14  Temp:  (!) 36.2 C  SpO2: 98% 98%    Last Pain:  Vitals:   05/30/21 1559  TempSrc: Temporal  PainSc: 3                  Precious Haws Elianny Buxbaum

## 2021-05-30 NOTE — Transfer of Care (Signed)
Immediate Anesthesia Transfer of Care Note  Patient: Teresa [name]  Procedure(s) Performed: XI ROBOTIC ASSISTED LAPAROSCOPIC CHOLECYSTECTOMY (Abdomen)  Patient Location: PACU  Anesthesia Type:General  Level of Consciousness: drowsy  Airway & Oxygen Therapy: Patient Spontanous Breathing and Patient connected to face mask oxygen  Post-op Assessment: Report given to RN and Post -op Vital signs reviewed and stable  Post vital signs: Reviewed and stable  Last Vitals:  Vitals Value Taken Time  BP 106/49 05/30/21 1525  Temp 36.7 C 05/30/21 1525  Pulse 76 05/30/21 1528  Resp 15 05/30/21 1528  SpO2 100 % 05/30/21 1528  Vitals shown include unvalidated device data.  Last Pain:  Vitals:   05/30/21 1525  TempSrc:   PainSc: Asleep      Patients Stated Pain Goal: 0 ([id number])  Complications: No notable events documented.

## 2021-05-30 NOTE — Anesthesia Preprocedure Evaluation (Signed)
Anesthesia Evaluation  Patient identified by MRN, date of birth, ID band Patient awake    Reviewed: Allergy & Precautions, NPO status , Patient's Chart, lab work & pertinent test results  Airway Mallampati: II  TM Distance: >3 FB Neck ROM: full    Dental no notable dental hx.    Pulmonary shortness of breath and with exertion,    Pulmonary exam normal        Cardiovascular hypertension, On Medications Normal cardiovascular exam     Neuro/Psych  Headaches, PSYCHIATRIC DISORDERS Depression  Neuromuscular disease    GI/Hepatic Neg liver ROS, GERD  Medicated,  Endo/Other  negative endocrine ROS  Renal/GU      Musculoskeletal   Abdominal   Peds  Hematology  (+) Blood dyscrasia, anemia ,   Anesthesia Other Findings Past Medical History: No date: Dyspnea No date: Headache     Comment:  migraines 1982: History of fractured vertebra     Comment:  3x No date: Hyperlipidemia No date: Low bone density No date: Melanoma ([place])     Comment:  Resected from Right foot.  No date: Transfusion of blood product refused for religious reason  Past Surgical History: No date: ABDOMINAL HYSTERECTOMY 12/18/2015: ARTERY BIOPSY; Left     Comment:  Procedure: BIOPSY TEMPORAL ARTERY;  Surgeon: Clyde Canterbury, MD;  Location: [facility];  Service:               ENT;  Laterality: Left; 11/03/1998: COLONOSCOPY 11/03/2012: EYE SURGERY; Bilateral     Comment:  Cataracts No date: melanoma excisison; Right     Comment:  1978  BMI    Body Mass Index: 20.98 kg/m      Reproductive/Obstetrics negative OB ROS                             Anesthesia Physical Anesthesia Plan  ASA: 2  Anesthesia Plan: General ETT   Post-op Pain Management:    Induction:   PONV Risk Score and Plan: Ondansetron, Dexamethasone, Midazolam and Treatment may vary due to age or medical condition  Airway  Management Planned:   Additional Equipment:   Intra-op Plan:   Post-operative Plan:   Informed Consent: I have reviewed the patients History and Physical, chart, labs and discussed the procedure including the risks, benefits and alternatives for the proposed anesthesia with the patient or authorized representative who has indicated his/her understanding and acceptance.     Dental Advisory Given  Plan Discussed with: Anesthesiologist, CRNA and Surgeon  Anesthesia Plan Comments:         Anesthesia Quick Evaluation

## 2021-05-30 NOTE — Op Note (Signed)
Preoperative diagnosis:  chronic and cholecystitis  Postoperative diagnosis: same as above  Procedure: Robotic assisted Laparoscopic Cholecystectomy.   Anesthesia: GETA   Surgeon: Benjamine Sprague  Specimen: Gallbladder  Complications: None  EBL: 47m  Wound Classification: Clean Contaminated  Indications: see HPI  Findings: Critical view of safety noted Cystic duct and artery identified, ligated and divided, clips remained intact at end of procedure Adequate hemostasis  Description of procedure:  The patient was placed on the operating table in the supine position. SCDs placed, pre-op abx administered.  General anesthesia was induced and OG tube placed by anesthesia. A time-out was completed verifying correct patient, procedure, site, positioning, and implant(s) and/or special equipment prior to beginning this procedure. The abdomen was prepped and draped in the usual sterile fashion.    Veress needle was placed at the Palmer's point and insufflation was started after confirming a positive saline drop test and no immediate increase in abdominal pressure.  After reaching 15 mm, the Veress needle was removed and 554mport placed same area via optview. The abdomen was inspected and no abnormalities or injuries were found.  A 8 mm port was placed under umbilicus measured 200000000rom gallbladder.  One 12 mm patient left of the umbilicus, 8cm from the optiviewed port, one 8 mm port placed to the patient right of the umbilical port 8 cm apart.  1 additional 8 mm port placed lateral to the 1231mort.  Once ports were placed, The table was placed in the reverse Trendelenburg position with the right side up. The Xi platform was brought into the operative field and docked to the ports successfully.  An endoscope was placed through the umbilical port, fenestrated grasper through the adjacent patient right port, prograsp to the far patient left port, and then a hook cautery in the left port.  The dome of  the gallbladder was grasped with prograsp, passed and retracted over the dome of the liver. Adhesions between the gallbladder and omentum, duodenum and transverse colon were lysed via hook cautery. The infundibulum was grasped with the fenestrated grasper and retracted toward the right lower quadrant. This maneuver exposed Calot's triangle. The peritoneum overlying the gallbladder infundibulum was then dissected using combination of [place] dissector and electrocautery hook and the cystic duct and cystic artery identified.  Critical view of safety with the liver bed clearly visible behind the duct and artery with no additional structures noted.  The cystic duct and cystic artery clipped and divided close to the gallbladder.     The gallbladder was then dissected from its peritoneal and liver bed attachments by electrocautery. Hemostasis was checked prior to removing the hook cautery and the Endo Catch bag was then placed through the 12 mm port and the gallbladder was removed.  The gallbladder was passed off the table as a specimen. There was no evidence of bleeding from the gallbladder fossa or cystic artery or leakage of the bile from the cystic duct stump. The 12 mm port site closed with PMI using 0 vicryl under direct vision.  Abdomen desufflated and secondary trocars were removed under direct vision. No bleeding was noted. All skin incisions then closed with subcuticular sutures of 4-0 monocryl and dressed with topical skin adhesive. The orogastric tube was removed and patient extubated.  The patient tolerated the procedure well and was taken to the postanesthesia care unit in stable condition.  All sponge and instrument count correct at end of procedure.

## 2021-05-30 NOTE — Interval H&P Note (Signed)
History and Physical Interval Note:  05/30/2021 1:31 PM  Teresa [name]  has presented today for surgery, with the diagnosis of 81.0 acute cholecystitis.  The various methods of treatment have been discussed with the patient and family. After consideration of risks, benefits and other options for treatment, the patient has consented to  Procedure(s): XI ROBOTIC ASSISTED LAPAROSCOPIC CHOLECYSTECTOMY (N/A) Stacy (ICG) (N/A) as a surgical intervention.  The patient's history has been reviewed, patient examined, no change in status, stable for surgery.  I have reviewed the patient's chart and labs.  Questions were answered to the patient's satisfaction.     Othman Masur Lysle Pearl

## 2021-06-03 LAB — SURGICAL PATHOLOGY

## 2021-06-10 ENCOUNTER — Other Ambulatory Visit: Payer: Self-pay | Admitting: Surgery

## 2021-06-10 ENCOUNTER — Ambulatory Visit
Admission: RE | Admit: 2021-06-10 | Discharge: 2021-06-10 | Disposition: A | Discharge status: Home / Self Care | Payer: Medicare Other | Source: Ambulatory Visit | Attending: Surgery | Admitting: Surgery

## 2021-06-10 ENCOUNTER — Other Ambulatory Visit: Payer: Self-pay

## 2021-06-10 DIAGNOSIS — R1011 Right upper quadrant pain: Secondary | ICD-10-CM | POA: Diagnosis not present

## 2021-06-10 DIAGNOSIS — Z9049 Acquired absence of other specified parts of digestive tract: Secondary | ICD-10-CM | POA: Diagnosis present

## 2021-06-17 ENCOUNTER — Other Ambulatory Visit: Payer: Self-pay

## 2021-06-17 ENCOUNTER — Other Ambulatory Visit: Payer: Self-pay | Admitting: Surgery

## 2021-06-17 ENCOUNTER — Ambulatory Visit
Admission: RE | Admit: 2021-06-17 | Discharge: 2021-06-17 | Disposition: A | Discharge status: Home / Self Care | Payer: Medicare Other | Source: Ambulatory Visit | Attending: Surgery | Admitting: Surgery

## 2021-06-17 DIAGNOSIS — R1012 Left upper quadrant pain: Secondary | ICD-10-CM | POA: Diagnosis not present

## 2021-06-17 LAB — POCT I-STAT CREATININE: Creatinine, Ser: 1 mg/dL (ref 0.44–1.00)

## 2021-06-17 MED ORDER — IOHEXOL 350 MG/ML SOLN
80.0000 mL | Freq: Once | INTRAVENOUS | Status: AC | PRN
  Administered 2021-06-17: 80 mL via INTRAVENOUS

## 2021-06-25 ENCOUNTER — Ambulatory Visit
Payer: Medicare Other | Attending: Student in an Organized Health Care Education/Training Program | Admitting: Student in an Organized Health Care Education/Training Program

## 2021-06-25 ENCOUNTER — Other Ambulatory Visit: Payer: Self-pay

## 2021-06-25 ENCOUNTER — Encounter: Payer: Self-pay | Admitting: Student in an Organized Health Care Education/Training Program

## 2021-06-25 VITALS — BP 126/67 | HR 104 | Temp 97.5°F | Resp 16 | Ht 66.0 in | Wt 135.0 lb

## 2021-06-25 DIAGNOSIS — G894 Chronic pain syndrome: Secondary | ICD-10-CM | POA: Diagnosis present

## 2021-06-25 DIAGNOSIS — G8929 Other chronic pain: Secondary | ICD-10-CM | POA: Diagnosis present

## 2021-06-25 DIAGNOSIS — M25512 Pain in left shoulder: Secondary | ICD-10-CM

## 2021-06-25 DIAGNOSIS — M19012 Primary osteoarthritis, left shoulder: Secondary | ICD-10-CM | POA: Diagnosis not present

## 2021-06-25 DIAGNOSIS — G5682 Other specified mononeuropathies of left upper limb: Secondary | ICD-10-CM | POA: Insufficient documentation

## 2021-06-25 DIAGNOSIS — M67912 Unspecified disorder of synovium and tendon, left shoulder: Secondary | ICD-10-CM | POA: Diagnosis not present

## 2021-06-25 MED ORDER — OXYCODONE-ACETAMINOPHEN 5-325 MG PO TABS: 1.0000 | ORAL_TABLET | Freq: Two times a day (BID) | ORAL | 0 refills | Status: DC | PRN

## 2021-06-25 NOTE — Patient Instructions (Signed)

## 2021-06-25 NOTE — Progress Notes (Signed)
Nursing Pain Medication Assessment:  Safety precautions to be maintained throughout the outpatient stay will include: orient to surroundings, keep bed in low position, maintain call bell within reach at all times, provide assistance with transfer out of bed and ambulation.  Medication Inspection Compliance: Pill count conducted under aseptic conditions, in front of the patient. Neither the pills nor the bottle was removed from the patient's sight at any time. Once count was completed pills were immediately returned to the patient in their original bottle.  Medication: Oxycodone/APAP Pill/Patch Count:  3 of 60 pills remain Pill/Patch Appearance: Markings consistent with prescribed medication Bottle Appearance: Standard pharmacy container. Clearly labeled. Filled Date: 02 / 18 /  2021 Last Medication intake:  Yesterday

## 2021-06-25 NOTE — Progress Notes (Signed)
PROVIDER NOTE: Information contained herein reflects review and annotations entered in association with encounter. Interpretation of such information and data should be left to medically-trained personnel. Information provided to patient can be located elsewhere in the medical record under "Patient Instructions". Document created using STT-dictation technology, any transcriptional errors that may result from process are unintentional.    Patient: Teresa [name]  Service Category: E/M  Provider: Gillis Santa, MD  DOB: Nov 06, 1955  DOS: 06/25/2021  Specialty: Interventional Pain Management  MRN: [id number]  Setting: Ambulatory outpatient  PCP: Tracie Harrier, MD  Type: Established Patient    Referring Provider: Tracie Harrier, MD  Location: Office  Delivery: Face-to-face     HPI  Teresa [name], a 65 y.o. 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 [name] primary complain today is Shoulder Pain (left) Last encounter: My last encounter with her was on 03/28/2021. Pertinent problems: [name] has INSOMNIA, CHRONIC; DEPRESSION; BACK PAIN, CHRONIC; Lumbar radiculopathy (right L5); Lumbar facet arthropathy; Degeneration of lumbar or lumbosacral intervertebral disc; Chronic pain syndrome; Arthritis of left glenohumeral joint; and Left rotator cuff tear arthropathy on their pertinent problem list. Pain Assessment: Severity of Chronic pain is reported as a 2 /10. Location: Shoulder Left/denies. Onset: More than a month ago. Quality: Throbbing. Timing: Intermittent (with movement). Modifying factor(s): not moving it. Vitals:  height is _0  (1.676 m) and weight is 135 lb (61.2 kg). Her temporal temperature is 97.5 F (36.4 C) (abnormal). Her blood pressure is 126/67 and her pulse is 104 (abnormal). Her respiration is 16 and oxygen saturation is 100%.   Reason for encounter: worsening of previously known (established) problem  Increased left  shoulder pain.  Pain with shoulder abduction.  History of rotator cuff dysfunction, left glenohumeral arthritis.  Has tried a previous left posterior glenohumeral joint steroid injection with limited response.  She states that this feels like a different pain.  Pain overlying her suprascapular notch consistent with left suprascapular entrapment.  Discussed diagnostic left suprascapular nerve block.  Risks and benefits reviewed and patient would like to proceed.  Patient also requesting a refill of her oxycodone.  She takes this medication very seldomly.  Last refill was December 25, 2020.  She does not take more than 1 tablet a day.  Pharmacotherapy Assessment  Analgesic: Oxycodone 5 mg daily as needed for breakthrough pain.  Does not take daily.  Monitoring: [place] PMP: PDMP reviewed during this encounter.       Pharmacotherapy: No side-effects or adverse reactions reported. Compliance: No problems identified. Effectiveness: Clinically acceptable.  [name], RN  06/25/2021  8:27 AM  Sign when Signing Visit Nursing Pain Medication Assessment:  Safety precautions to be maintained throughout the outpatient stay will include: orient to surroundings, keep bed in low position, maintain call bell within reach at all times, provide assistance with transfer out of bed and ambulation.  Medication Inspection Compliance: Pill count conducted under aseptic conditions, in front of the patient. Neither the pills nor the bottle was removed from the patient's sight at any time. Once count was completed pills were immediately returned to the patient in their original bottle.  Medication: Oxycodone/APAP Pill/Patch Count:  3 of 60 pills remain Pill/Patch Appearance: Markings consistent with prescribed medication Bottle Appearance: Standard pharmacy container. Clearly labeled. Filled Date: 02 / 18 /  2021 Last Medication intake:  Yesterday      UDS:  Summary  Date Value Ref Range Status  04/03/2020 Note  Final     Comment:    ==================================================================== [employer] Select 13 (MW) ==================================================================== Test                             Result       Flag       Units Drug Absent but Declared for Prescription Verification   Oxycodone                      Not Detected UNEXPECTED ng/mg creat ==================================================================== Test                      Result    Flag   Units      Ref Range   Creatinine              194              mg/dL      >=20 ==================================================================== Declared Medications:  The flagging and interpretation on this report are based on the  following declared medications.  Unexpected results may arise from  inaccuracies in the declared medications.  **Note: The testing scope of this panel includes these medications:  Oxycodone  **Note: The testing scope of this panel does not include the  following reported medications:  Acetaminophen  Alendronate (Fosamax)  Buspirone (Buspar)  Calcium  Cholecalciferol  Gabapentin (Neurontin)  Iron (Fergon)  Multivitamin  Nortriptyline (Pamelor)  Tizanidine (Zanaflex)  Topical  Topical Diclofenac  Topiramate (Topamax)  Trazodone (Desyrel)  Zolpidem (Ambien) ==================================================================== For clinical consultation, please call [phone]. ====================================================================      ROS  Constitutional: Denies any fever or chills Gastrointestinal: No reported hemesis, hematochezia, vomiting, or acute GI distress Musculoskeletal:  Left shoulder pain Neurological: No reported episodes of acute onset apraxia, aphasia, dysarthria, agnosia, amnesia, paralysis, loss of coordination, or loss of consciousness  Medication Review  Calcium Carb-Cholecalciferol, Inulin, Magnesium, Vitamin D3, acetaminophen,  alendronate, amoxicillin-clavulanate, aspirin-acetaminophen-caffeine, cyanocobalamin, dicyclomine, ferrous gluconate, gabapentin, ibuprofen, multivitamin with minerals, nortriptyline, oxyCODONE-acetaminophen, pantoprazole, tiZANidine, topiramate, vitamin C, and zolpidem  History Review  Allergy: Ms. Mosteller is allergic to elastic bandages & [zinc] and morphine. Drug: Ms. Kamm  reports no history of drug use. Alcohol:  reports no history of alcohol use. Tobacco:  reports that she has never smoked. She has never used smokeless tobacco. Social: Ms. Jared  reports that she has never smoked. She has never used smokeless tobacco. She reports that she does not drink alcohol and does not use drugs. Medical:  has a past medical history of Dyspnea, Headache, History of fractured vertebra (1982), Hyperlipidemia, Low bone density, Melanoma ([place]), and Transfusion of blood product refused for religious reason. Surgical: Ms. Rauth  has a past surgical history that includes Abdominal hysterectomy; Colonoscopy (11/03/1998); Eye surgery (Bilateral, 11/03/2012); Artery Biopsy (Left, 12/18/2015); and melanoma excisison (Right). Family: family history includes Dementia in her mother; Heart attack in her father.  Laboratory Chemistry Profile   Renal Lab Results  Component Value Date   BUN 11 08/14/2014   CREATININE 1.00 06/17/2021   GFRAA >90 08/14/2014   GFRNONAA 89 (L) 08/14/2014    Hepatic No results found for: AST, ALT, ALBUMIN, ALKPHOS, HCVAB, AMYLASE, LIPASE, AMMONIA  Electrolytes Lab Results  Component Value Date   NA 139 08/14/2014   K 4.0 08/14/2014   CL 99 08/14/2014   CALCIUM 10.2 08/14/2014    Bone No results found for: [place], [id number], [id number], [id number],  25OHVITD1, 25OHVITD2, 25OHVITD3, TESTOFREE, TESTOSTERONE  Inflammation (CRP: Acute Phase) (ESR: Chronic Phase) No results found for: CRP, ESRSEDRATE, LATICACIDVEN       Note: Above Lab results reviewed.  Recent Imaging Review   CT ABDOMEN PELVIS W CONTRAST CLINICAL DATA:  Diarrhea for 2 weeks. Left lower quadrant pain beginning yesterday. Two weeks postop from cholecystectomy.  EXAM: CT ABDOMEN AND PELVIS WITH CONTRAST  TECHNIQUE: Multidetector CT imaging of the abdomen and pelvis was performed using the standard protocol following bolus administration of intravenous contrast.  CONTRAST:  45m OMNIPAQUE IOHEXOL 350 MG/ML SOLN  COMPARISON:  None.  FINDINGS: Lower Chest: No acute findings.  Hepatobiliary: No hepatic masses identified. Prior cholecystectomy. No evidence of biliary obstruction.  Pancreas:  No mass or inflammatory changes.  Spleen: Within normal limits in size and appearance.  Adrenals/Urinary Tract: No masses identified. 2 mm calculus noted in lower pole of right kidney. No evidence of ureteral calculi or hydronephrosis. Unremarkable unopacified urinary bladder.  Stomach/Bowel: No evidence of obstruction, inflammatory process or abnormal fluid collections.  Vascular/Lymphatic: No pathologically enlarged lymph nodes. No acute vascular findings. Aortic atherosclerotic calcification noted.  Reproductive: Prior hysterectomy noted. Adnexal regions are unremarkable in appearance.  Other:  None.  Musculoskeletal:  No suspicious bone lesions identified.  IMPRESSION: Tiny right renal calculus. No evidence of ureteral calculi, hydronephrosis, or other acute findings.  Aortic Atherosclerosis (ICD10-I70.0).  Electronically Signed   By: JMarlaine HindM.D.   On: 06/17/2021 14:26 Note: Reviewed        Physical Exam  General appearance: Well nourished, well developed, and well hydrated. In no apparent acute distress Mental status: Alert, oriented x 3 (person, place, & time)       Respiratory: No evidence of acute respiratory distress Eyes: PERLA Vitals: BP 126/67 (BP Location: Left Arm, Patient Position: Sitting, Cuff Size: Normal)   Pulse (!) 104   Temp (!) 97.5 F (36.4 C)  (Temporal)   Resp 16   Ht _0  (1.676 m)   Wt 135 lb (61.2 kg)   SpO2 100%   BMI 21.79 kg/m  BMI: Estimated body mass index is 21.79 kg/m as calculated from the following:   Height as of this encounter: _1  (1.676 m).   Weight as of this encounter: 135 lb (61.2 kg). Ideal: Ideal body weight: 59.3 kg (130 lb 11.7 oz) Adjusted ideal body weight: 60.1 kg (132 lb 7 oz)  Cervical Spine Area Exam  Skin & Axial Inspection: No masses, redness, edema, swelling, or associated skin lesions Alignment: Symmetrical Functional ROM: Unrestricted ROM      Stability: No instability detected Muscle Tone/Strength: Functionally intact. No obvious neuro-muscular anomalies detected. Sensory (Neurological): Referred pain pattern from left shoulder Palpation: No palpable anomalies             Upper Extremity (UE) Exam    Side: Right upper extremity  Side: Left upper extremity  Skin & Extremity Inspection: Skin color, temperature, and hair growth are WNL. No peripheral edema or cyanosis. No masses, redness, swelling, asymmetry, or associated skin lesions. No contractures.  Skin & Extremity Inspection: Skin color, temperature, and hair growth are WNL. No peripheral edema or cyanosis. No masses, redness, swelling, asymmetry, or associated skin lesions. No contractures.  Functional ROM: Unrestricted ROM          Functional ROM: Pain restricted ROM left shoulder          Muscle Tone/Strength: Functionally intact. No obvious neuro-muscular anomalies detected.  Muscle Tone/Strength: Functionally intact. No  obvious neuro-muscular anomalies detected.  Sensory (Neurological): Unimpaired          Sensory (Neurological): Arthropathic arthralgia          Palpation: No palpable anomalies              Palpation: No palpable anomalies              Provocative Test(s):  Phalen's test: deferred Tinel's test: deferred Apley's scratch test (touch opposite shoulder):  Action 1 (Across chest): Adequate ROM Action 2  (Overhead): Adequate ROM Action 3 (LB reach): Adequate ROM   Provocative Test(s):  Phalen's test: deferred Tinel's test: deferred Apley's scratch test (touch opposite shoulder):  Action 1 (Across chest): Decreased ROM Action 2 (Overhead): Decreased ROM Action 3 (LB reach): Decreased ROM     Assessment   Status Diagnosis  Having a Flare-up Having a Flare-up Having a Flare-up 1. Neuropathy of left suprascapular nerve   2. Chronic left shoulder pain   3. Dysfunction of left rotator cuff   4. Arthritis of left glenohumeral joint   5. Chronic pain syndrome      Updated Problems: Problem  Neuropathy of Left Suprascapular Nerve  Chronic Left Shoulder Pain  Dysfunction of Left Rotator Cuff    Plan of Care   Ms. LYNASIA MELOCHE has a current medication list which includes the following long-term medication(s): calcium carb-cholecalciferol, dicyclomine, ferrous gluconate, pantoprazole, topiramate, and zolpidem.  Pharmacotherapy (Medications Ordered): Meds ordered this encounter  Medications   oxyCODONE-acetaminophen (PERCOCET/ROXICET) 5-325 MG tablet    Sig: Take 1 tablet by mouth 2 (two) times daily as needed for severe pain.    Dispense:  30 tablet    Refill:  0   Orders:  Orders Placed This Encounter  Procedures   SUPRASCAPULAR NERVE BLOCK    For shoulder pain.    Standing Status:   Future    Standing Expiration Date:   09/25/2021    Scheduling Instructions:     LEFT     Level(s): Suprascapular notch     Sedation: local     Scheduling Timeframe: As permitted by the schedule    Order Specific Question:   Where will this procedure be performed?    Answer:   [facility]    Consider left shoulder MRI without contrast.  Follow-up plan:   Return in about 15 days (around 07/10/2021) for LEFT SSNB , without sedation.     Status post left L3, L4, L5, S1 RFA on 06/15/2019, R L3,4,5, S1 RFA on 06/29/2019, left posterior glenohumeral shoulder steroid injection  01/02/2020: Not effective.  Left L4 and Sprint peripheral nerve stimulation on 04/25/2020, removed 06/11/2020.           Recent Visits No visits were found meeting these conditions. Showing recent visits within past 90 days and meeting all other requirements Today's Visits Date Type Provider Dept  06/25/21 Office Visit Gillis Santa, MD [facility]  Showing today's visits and meeting all other requirements Future Appointments Date Type Provider Dept  07/15/21 Appointment Gillis Santa, MD [facility]  Showing future appointments within next 90 days and meeting all other requirements I discussed the assessment and treatment plan with the patient. The patient was provided an opportunity to ask questions and all were answered. The patient agreed with the plan and demonstrated an understanding of the instructions.  Patient advised to call back or seek an in-person evaluation if the symptoms or condition worsens.  Duration of encounter: 45mnutes.  Note  by: Gillis Santa, MD Date: 06/25/2021; Time: 10:09 AM

## 2021-06-26 ENCOUNTER — Ambulatory Visit: Payer: Medicare Other | Admitting: Student in an Organized Health Care Education/Training Program

## 2021-07-01 ENCOUNTER — Telehealth: Payer: Self-pay

## 2021-07-01 ENCOUNTER — Inpatient Hospital Stay: Payer: Medicare Other

## 2021-07-01 ENCOUNTER — Other Ambulatory Visit: Payer: Self-pay

## 2021-07-01 ENCOUNTER — Ambulatory Visit (HOSPITAL_BASED_OUTPATIENT_CLINIC_OR_DEPARTMENT_OTHER): Payer: Medicare Other | Admitting: Student in an Organized Health Care Education/Training Program

## 2021-07-01 ENCOUNTER — Ambulatory Visit
Admission: RE | Admit: 2021-07-01 | Discharge: 2021-07-01 | Disposition: A | Discharge status: Home / Self Care | Payer: Medicare Other | Source: Ambulatory Visit | Attending: Student in an Organized Health Care Education/Training Program | Admitting: Student in an Organized Health Care Education/Training Program

## 2021-07-01 ENCOUNTER — Inpatient Hospital Stay: Payer: Medicare Other | Admitting: Oncology

## 2021-07-01 ENCOUNTER — Encounter: Payer: Self-pay | Admitting: Student in an Organized Health Care Education/Training Program

## 2021-07-01 VITALS — BP 135/76 | HR 101 | Temp 96.7°F | Resp 17 | Ht 66.0 in | Wt 135.0 lb

## 2021-07-01 DIAGNOSIS — M67912 Unspecified disorder of synovium and tendon, left shoulder: Secondary | ICD-10-CM | POA: Diagnosis present

## 2021-07-01 DIAGNOSIS — M25512 Pain in left shoulder: Secondary | ICD-10-CM | POA: Diagnosis present

## 2021-07-01 DIAGNOSIS — G8929 Other chronic pain: Secondary | ICD-10-CM

## 2021-07-01 DIAGNOSIS — G5682 Other specified mononeuropathies of left upper limb: Secondary | ICD-10-CM

## 2021-07-01 DIAGNOSIS — M19012 Primary osteoarthritis, left shoulder: Secondary | ICD-10-CM | POA: Diagnosis present

## 2021-07-01 MED ORDER — ROPIVACAINE HCL 2 MG/ML IJ SOLN
4.0000 mL | Freq: Once | INTRAMUSCULAR | Status: AC
  Administered 2021-07-01: 4 mL via INTRA_ARTICULAR

## 2021-07-01 MED ORDER — DEXAMETHASONE SODIUM PHOSPHATE 10 MG/ML IJ SOLN
10.0000 mg | Freq: Once | INTRAMUSCULAR | Status: AC
  Administered 2021-07-01: 10 mg
  Filled 2021-07-01: qty 1

## 2021-07-01 MED ORDER — MELOXICAM 15 MG PO TABS: 7.5000 mg | ORAL_TABLET | Freq: Every day | ORAL | 0 refills | Status: AC | PRN

## 2021-07-01 MED ORDER — LIDOCAINE HCL 2 % IJ SOLN
20.0000 mL | Freq: Once | INTRAMUSCULAR | Status: AC
  Administered 2021-07-01: 100 mg

## 2021-07-01 MED ORDER — IOHEXOL 180 MG/ML  SOLN
10.0000 mL | Freq: Once | INTRAMUSCULAR | Status: AC
  Administered 2021-07-01: 10 mL via INTRA_ARTICULAR

## 2021-07-01 NOTE — Progress Notes (Signed)
PROVIDER NOTE: Information contained herein reflects review and annotations entered in association with encounter. Interpretation of such information and data should be left to medically-trained personnel. Information provided to patient can be located elsewhere in the medical record under "Patient Instructions". Document created using STT-dictation technology, any transcriptional errors that may result from process are unintentional.    Patient: Teresa [name]  Service Category: Procedure  Provider: Gillis Santa, MD  DOB: 1956/06/30  DOS: 07/01/2021  Location: [facility]  MRN: [id number]  Setting: Ambulatory - outpatient  Referring Provider: Tracie Harrier, MD  Type: Established Patient  Specialty: Interventional Pain Management  PCP: Tracie Harrier, MD   Primary Reason for Visit: Interventional Pain Management Treatment. CC: Shoulder Pain (left)    Procedure:          Anesthesia, Analgesia, Anxiolysis:  Type: Diagnostic Suprascapular nerve Block          Primary Purpose: Diagnostic Region: Posterior Shoulder & Scapular Areas Level: Superior to the scapular spine, in the lateral aspect of the supraspinatus fossa (Suprescapular notch). Target Area: Suprascapular nerve as it passes thru the lower portion of the suprascapular notch. Approach: Posterior percutaneous approach. Laterality: Left-Side  Type: Local Anesthesia  Local Anesthetic: Lidocaine 1-2%  Position: Prone   Indications: 1. Neuropathy of left suprascapular nerve   2. Chronic left shoulder pain   3. Dysfunction of left rotator cuff   4. Arthritis of left glenohumeral joint    Pain Score: Pre-procedure: 4 /10 Post-procedure: 4 /10     Pre-op H&P Assessment:  Teresa [name] is a 65 y.o. (year old), female patient, seen today for interventional treatment. She  has a past surgical history that includes Abdominal hysterectomy; Colonoscopy (11/03/1998); Eye surgery (Bilateral, 11/03/2012); Artery Biopsy  (Left, 12/18/2015); and melanoma excisison (Right). Teresa [name] has a current medication list which includes the following prescription(s): acetaminophen, alendronate, vitamin c, aspirin-acetaminophen-caffeine, calcium carb-cholecalciferol, vitamin d3, cvs vitamin b12, dicyclomine, ferrous gluconate, gabapentin, inulin, magnesium, multivitamin with minerals, nortriptyline, oxycodone-acetaminophen, pantoprazole, tizanidine, topiramate, zolpidem, amoxicillin-clavulanate, and ibuprofen. Her primarily concern today is the Shoulder Pain (left)  Initial Vital Signs:  Pulse/HCG Rate: (!) 101ECG Heart Rate: 88 Temp: (!) 96.7 F (35.9 C) Resp: 18 BP: 126/69 SpO2: 99 %  BMI: Estimated body mass index is 21.79 kg/m as calculated from the following:   Height as of this encounter: '5\' 6"'$  (1.676 m).   Weight as of this encounter: 135 lb (61.2 kg).  Risk Assessment: Allergies: Reviewed. She is allergic to elastic bandages & [zinc] and morphine.  Allergy Precautions: None required Coagulopathies: Reviewed. None identified.  Blood-thinner therapy: None at this time Active Infection(s): Reviewed. None identified. Teresa [name] is afebrile  Site Confirmation: Ms. Andreen was asked to confirm the procedure and laterality before marking the site Procedure checklist: Completed Consent: Before the procedure and under the influence of no sedative(s), amnesic(s), or anxiolytics, the patient was informed of the treatment options, risks and possible complications. To fulfill our ethical and legal obligations, as recommended by the American Medical Association's Code of Ethics, I have informed the patient of my clinical impression; the nature and purpose of the treatment or procedure; the risks, benefits, and possible complications of the intervention; the alternatives, including doing nothing; the risk(s) and benefit(s) of the alternative treatment(s) or procedure(s); and the risk(s) and benefit(s) of doing nothing. The  patient was provided information about the general risks and possible complications associated with the procedure. These may include, but are not limited to: failure to achieve  desired goals, infection, bleeding, organ or nerve damage, allergic reactions, paralysis, and death. In addition, the patient was informed of those risks and complications associated to the procedure, such as failure to decrease pain; infection; bleeding; organ or nerve damage with subsequent damage to sensory, motor, and/or autonomic systems, resulting in permanent pain, numbness, and/or weakness of one or several areas of the body; allergic reactions; (i.e.: anaphylactic reaction); and/or death. Furthermore, the patient was informed of those risks and complications associated with the medications. These include, but are not limited to: allergic reactions (i.e.: anaphylactic or anaphylactoid reaction(s)); adrenal axis suppression; blood sugar elevation that in diabetics may result in ketoacidosis or comma; water retention that in patients with history of congestive heart failure may result in shortness of breath, pulmonary edema, and decompensation with resultant heart failure; weight gain; swelling or edema; medication-induced neural toxicity; particulate matter embolism and blood vessel occlusion with resultant organ, and/or nervous system infarction; and/or aseptic necrosis of one or more joints. Finally, the patient was informed that Medicine is not an exact science; therefore, there is also the possibility of unforeseen or unpredictable risks and/or possible complications that may result in a catastrophic outcome. The patient indicated having understood very clearly. We have given the patient no guarantees and we have made no promises. Enough time was given to the patient to ask questions, all of which were answered to the patient's satisfaction. Teresa [name] has indicated that she wanted to continue with the procedure. Attestation:  I, the ordering provider, attest that I have discussed with the patient the benefits, risks, side-effects, alternatives, likelihood of achieving goals, and potential problems during recovery for the procedure that I have provided informed consent. Date  Time: 07/01/2021 10:36 AM  Pre-Procedure Preparation:  Monitoring: As per clinic protocol. Respiration, ETCO2, SpO2, BP, heart rate and rhythm monitor placed and checked for adequate function Safety Precautions: Patient was assessed for positional comfort and pressure points before starting the procedure. Time-out: I initiated and conducted the "Time-out" before starting the procedure, as per protocol. The patient was asked to participate by confirming the accuracy of the "Time Out" information. Verification of the correct person, site, and procedure were performed and confirmed by me, the nursing staff, and the patient. "Time-out" conducted as per Joint Commission's Universal Protocol (UP.01.01.01). Time: 1128  Description of Procedure:          Area Prepped: Entire shoulder Area DuraPrep (Iodine Povacrylex [0.7% available iodine] and Isopropyl Alcohol, 74% w/w) Safety Precautions: Aspiration looking for blood return was conducted prior to all injections. At no point did we inject any substances, as a needle was being advanced. No attempts were made at seeking any paresthesias. Safe injection practices and needle disposal techniques used. Medications properly checked for expiration dates. SDV (single dose vial) medications used. Description of the Procedure: Protocol guidelines were followed. The patient was placed in position over the procedure table. The target area was identified and the area prepped in the usual manner. Skin & deeper tissues infiltrated with local anesthetic. Appropriate amount of time allowed to pass for local anesthetics to take effect. The procedure needles were then advanced to the target area. Proper needle placement secured.  Negative aspiration confirmed. Solution injected in intermittent fashion, asking for systemic symptoms every 0.5cc of injectate. The needles were then removed and the area cleansed, making sure to leave some of the prepping solution back to take advantage of its long term bactericidal properties.  Vitals:   07/01/21 1037 07/01/21 1128 07/01/21 1134  BP: 126/69 127/75 135/76  Pulse: (!) 101    Resp: '18 18 17  '$ Temp: (!) 96.7 F (35.9 C)    SpO2: 99% 100% 100%  Weight: 135 lb (61.2 kg)    Height: '5\' 6"'$  (1.676 m)      Start Time: 1128 hrs. [name] Time: 1133 hrs. Materials:  Needle(s) Type: Spinal Needle Gauge: 22G Length: 3.5-in Medication(s): Please see orders for medications and dosing details. 5 cc solution made of 4 cc of 0.2% ropivacaine, 1 cc of Decadron 10 mg/cc. Injected along the left suprascapular nerve after contrast confirmation  A trigger point injection was also performed in the inferior medial scapular border with dry needling performed.   Imaging Guidance (Non-Spinal):          Type of Imaging Technique: Fluoroscopy Guidance (Non-Spinal) Indication(s): Assistance in needle guidance and placement for procedures requiring needle placement in or near specific anatomical locations not easily accessible without such assistance. Exposure Time: Please see nurses notes. Contrast: Before injecting any contrast, we confirmed that the patient did not have an allergy to iodine, shellfish, or radiological contrast. Once satisfactory needle placement was completed at the desired level, radiological contrast was injected. Contrast injected under live fluoroscopy. No contrast complications. See chart for type and volume of contrast used. Fluoroscopic Guidance: I was personally present during the use of fluoroscopy. "Tunnel Vision Technique" used to obtain the best possible view of the target area. Parallax error corrected before commencing the procedure. "Direction-depth-direction" technique  used to introduce the needle under continuous pulsed fluoroscopy. Once target was reached, antero-posterior, oblique, and lateral fluoroscopic projection used confirm needle placement in all planes. Images permanently stored in EMR. Interpretation: I personally interpreted the imaging intraoperatively. Adequate needle placement confirmed in multiple planes. Appropriate spread of contrast into desired area was observed. No evidence of afferent or efferent intravascular uptake. Permanent images saved into the patient's record.   Post-operative Assessment:  Post-procedure Vital Signs:  Pulse/HCG Rate: (!) 10190 Temp: (!) 96.7 F (35.9 C) Resp: 17 BP: 135/76 SpO2: 100 %  EBL: None  Complications: No immediate post-treatment complications observed by team, or reported by patient.  Note: The patient tolerated the entire procedure well. A repeat set of vitals were taken after the procedure and the patient was kept under observation following institutional policy, for this type of procedure. Post-procedural neurological assessment was performed, showing return to baseline, prior to discharge. The patient was provided with post-procedure discharge instructions, including a section on how to identify potential problems. Should any problems arise concerning this procedure, the patient was given instructions to immediately contact us, at any time, without hesitation. In any case, we plan to contact the patient by telephone for a follow-up status report regarding this interventional procedure.  Comments:  No additional relevant information.  Plan of Care  Orders:  Orders Placed This Encounter  Procedures   DG PAIN [facility] C-ARM 1-60 MIN NO REPORT    Intraoperative interpretation by procedural physician at [facility].    Standing Status:   Standing    Number of Occurrences:   1    Order Specific Question:   Reason for exam:    Answer:   Assistance in needle guidance and placement for  procedures requiring needle placement in or near specific anatomical locations not easily accessible without such assistance.    Medications ordered for procedure: Meds ordered this encounter  Medications   iohexol (OMNIPAQUE) 180 MG/ML injection 10 mL    Must be Myelogram-compatible. If  not available, you may substitute with a water-soluble, non-ionic, hypoallergenic, myelogram-compatible radiological contrast medium.   lidocaine (XYLOCAINE) 2 % (with pres) injection 400 mg   dexamethasone (DECADRON) injection 10 mg   ropivacaine (PF) 2 mg/mL (0.2%) (NAROPIN) injection 4 mL   Medications administered: We administered iohexol, lidocaine, dexamethasone, and ropivacaine (PF) 2 mg/mL (0.2%).  See the medical record for exact dosing, route, and time of administration.  Follow-up plan:   Return in about 6 weeks (around 08/12/2021) for Post Procedure Evaluation, virtual.       Status post left L3, L4, L5, S1 RFA on 06/15/2019, R L3,4,5, S1 RFA on 06/29/2019, left posterior glenohumeral shoulder steroid injection 01/02/2020: Not effective.  Left L4 and Sprint peripheral nerve stimulation on 04/25/2020, removed 06/11/2020.  Left suprascapular nerve block 07/01/2021           Recent Visits Date Type Provider Dept  06/25/21 Office Visit Gillis Santa, MD [facility]  Showing recent visits within past 90 days and meeting all other requirements Today's Visits Date Type Provider Dept  07/01/21 Procedure visit Gillis Santa, MD [facility]  Showing today's visits and meeting all other requirements Future Appointments Date Type Provider Dept  08/05/21 Appointment Gillis Santa, MD [facility]  Showing future appointments within next 90 days and meeting all other requirements Disposition: Discharge home  Discharge (Date  Time): 07/01/2021; 1145 hrs.   Primary Care Physician: Tracie Harrier, MD Location: [facility] Note by: Gillis Santa,  MD Date: 07/01/2021; Time: 1:07 PM  Disclaimer:  Medicine is not an exact science. The only guarantee in medicine is that nothing is guaranteed. It is important to note that the decision to proceed with this intervention was based on the information collected from the patient. The Data and conclusions were drawn from the patient's questionnaire, the interview, and the physical examination. Because the information was provided in large part by the patient, it cannot be guaranteed that it has not been purposely or unconsciously manipulated. Every effort has been made to obtain as much relevant data as possible for this evaluation. It is important to note that the conclusions that lead to this procedure are derived in large part from the available data. Always take into account that the treatment will also be dependent on availability of resources and existing treatment guidelines, considered by other Pain Management Practitioners as being common knowledge and practice, at the time of the intervention. For Medico-Legal purposes, it is also important to point out that variation in procedural techniques and pharmacological choices are the acceptable norm. The indications, contraindications, technique, and results of the above procedure should only be interpreted and judged by a Board-Certified Interventional Pain Specialist with extensive familiarity and expertise in the same exact procedure and technique.

## 2021-07-01 NOTE — Patient Instructions (Signed)

## 2021-07-01 NOTE — Telephone Encounter (Signed)
Patient called and states she forgot to ask you for an antiinflammatory.  She states she is having a lot of joint achiness and would like for you to prescribe her an antiinflammatory.

## 2021-07-01 NOTE — Progress Notes (Signed)
Nursing Pain Medication Assessment:  Safety precautions to be maintained throughout the outpatient stay will include: orient to surroundings, keep bed in low position, maintain call bell within reach at all times, provide assistance with transfer out of bed and ambulation.  Medication Inspection Compliance: Pill count conducted under aseptic conditions, in front of the patient. Neither the pills nor the bottle was removed from the patient's sight at any time. Once count was completed pills were immediately returned to the patient in their original bottle.  Medication: Oxycodone/APAP Pill/Patch Count:  1 of 30 pills remain Pill/Patch Appearance: Markings consistent with prescribed medication Bottle Appearance: Standard pharmacy container. Clearly labeled. Filled Date: 02 / 22 / 2022 Last Medication intake:  Today

## 2021-07-02 ENCOUNTER — Telehealth: Payer: Self-pay

## 2021-07-02 NOTE — Telephone Encounter (Signed)
Post procedure phone call.  Patient states she had some pain after the numbing medicine wore off.  Instructed to use heat today and give the steroid a few days to start working but to call us back for any further questions or concerns.

## 2021-07-03 ENCOUNTER — Ambulatory Visit: Payer: Medicare Other | Admitting: Student in an Organized Health Care Education/Training Program

## 2021-07-15 ENCOUNTER — Other Ambulatory Visit: Payer: Self-pay

## 2021-07-15 ENCOUNTER — Ambulatory Visit
Payer: Medicare Other | Attending: Student in an Organized Health Care Education/Training Program | Admitting: Student in an Organized Health Care Education/Training Program

## 2021-07-15 ENCOUNTER — Encounter: Payer: Self-pay | Admitting: Student in an Organized Health Care Education/Training Program

## 2021-07-15 ENCOUNTER — Ambulatory Visit: Payer: Medicare Other | Admitting: Student in an Organized Health Care Education/Training Program

## 2021-07-15 DIAGNOSIS — G5701 Lesion of sciatic nerve, right lower limb: Secondary | ICD-10-CM | POA: Diagnosis not present

## 2021-07-15 DIAGNOSIS — M533 Sacrococcygeal disorders, not elsewhere classified: Secondary | ICD-10-CM

## 2021-07-15 DIAGNOSIS — M47818 Spondylosis without myelopathy or radiculopathy, sacral and sacrococcygeal region: Secondary | ICD-10-CM | POA: Diagnosis not present

## 2021-07-15 DIAGNOSIS — M461 Sacroiliitis, not elsewhere classified: Secondary | ICD-10-CM

## 2021-07-15 NOTE — Progress Notes (Signed)
Patient: Teresa [name]  Service Category: E/M  Provider: Gillis Santa, MD  DOB: [birth year]  DOS: 07/15/2021  Location: Office  MRN: [id number]  Setting: Ambulatory outpatient  Referring Provider: Tracie Harrier, MD  Type: Established Patient  Specialty: Interventional Pain Management  PCP: Tracie Harrier, MD  Location: Home  Delivery: TeleHealth     Virtual Encounter - Pain Management PROVIDER NOTE: Information contained herein reflects review and annotations entered in association with encounter. Interpretation of such information and data should be left to medically-trained personnel. Information provided to patient can be located elsewhere in the medical record under "Patient Instructions". Document created using STT-dictation technology, any transcriptional errors that may result from process are unintentional.    Contact & Pharmacy Preferred: [place]: [phone] (home) Mobile: [phone] (mobile) E-mail: [email or web address]  [id number] [place] - [address] [id number]: [phone]: [phone] [place]: [phone]: [phone]  Pre-screening  Ms. AHoltenoffered "in-person" vs "virtual" encounter. She indicated preferring virtual for this encounter.   Reason COVID-19*  Social distancing based on CDC and AMA recommendations.   I contacted DEloy Endon 07/15/2021 via telephone.      I clearly identified myself as BGillis Santa MD. I verified that I was speaking with the correct person using two identifiers (Name: DRUTHANNE MCNEISH and date of birth: [birth year].  Consent I sought verbal advanced consent from DEloy Endfor virtual visit interactions. I informed Ms. AGeisof possible security and privacy concerns, risks, and limitations associated with providing "not-in-person"  medical evaluation and management services. I also informed Ms. ALeclaireof the availability of "in-person" appointments. Finally, I informed her that there would be a charge for the virtual visit and that she could be  personally, fully or partially, financially responsible for it. Ms. ASteedmanexpressed understanding and agreed to proceed.   Historic Elements   Ms. DTRINTY MARKENis a 65y.o. year old, female patient evaluated today after our last contact on 07/01/2021. Ms. AWoollard has a past medical history of Dyspnea, Headache, History of fractured vertebra (65), Hyperlipidemia, Low bone density, Melanoma ([place], and Transfusion of blood product refused for religious reason. She also  has a past surgical history that includes Abdominal hysterectomy; Colonoscopy (11/03/1998); Eye surgery (Bilateral, 11/03/2012); Artery Biopsy (Left, 12/18/2015); and melanoma excisison (Right). Ms. ARechhas a current medication list which includes the following prescription(s): acetaminophen, alendronate, vitamin c, aspirin-acetaminophen-caffeine, calcium carb-cholecalciferol, vitamin d3, cvs vitamin b12, dicyclomine, ferrous gluconate, gabapentin, inulin, magnesium, meloxicam, methylprednisolone, multivitamin with minerals, nortriptyline, oxycodone-acetaminophen, pantoprazole, tizanidine, topiramate, zolpidem, amoxicillin-clavulanate, and ibuprofen. She  reports that she has never smoked. She has never used smokeless tobacco. She reports that she does not drink alcohol and does not use drugs. Ms. ASchaafsmais allergic to elastic bandages & [zinc] and morphine.   HPI  Today, she is being contacted for worsening of previously known (established) problem  Increased right hip and right low back pain Did see Orthopedics with Dr HMarry Guanwho did not think that this was related to her right hip. He suggested that she follow-up with me to discuss SI joint intervention vs lumbar epidural steroid injection  [facility], she is having increased right buttock and right groin pain.  She says that it is exquisitely painful overlying her sacroiliac joint.  I had her perform some provocative maneuvers likely Faber's and Patrick's which was positive she did have pain with such movements.  We discussed a diagnostic right sacroiliac joint and right piriformis injection for SI joint and piriformis pain syndrome.  Risks and benefits reviewed and patient would like to proceed.   Laboratory Chemistry Profile   Renal Lab Results  Component Value Date   BUN 11 08/14/2014   CREATININE 1.00 06/17/2021   GFRAA >90 08/14/2014   GFRNONAA 89 (L) 08/14/2014    Hepatic No results found for: AST, ALT, ALBUMIN, ALKPHOS, HCVAB, AMYLASE, LIPASE, AMMONIA  Electrolytes Lab Results  Component Value Date   NA 139 08/14/2014   K 4.0 08/14/2014   CL 99 08/14/2014   CALCIUM 10.2 08/14/2014    Bone No results found for: VD25OH, VD125OH2TOT, TD4287GO1, LX7262MB5, 25OHVITD1, 25OHVITD2, 25OHVITD3, TESTOFREE, TESTOSTERONE  Inflammation (CRP: Acute Phase) (ESR: Chronic Phase) No results found for: CRP, ESRSEDRATE, LATICACIDVEN       Note: Above Lab results reviewed.   Assessment  The primary encounter diagnosis was Sacroiliac joint pain. 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  Plan of Care    Ms. NEFERTITI MOHAMAD has a current medication list which includes the following long-term medication(s): calcium carb-cholecalciferol, dicyclomine, ferrous gluconate, pantoprazole, topiramate, and zolpidem.  I also recommend to be considered piriformis release exercises as well as SI joint stretching exercises that she can perform at home.  She has had physical therapy for her low back and piriformis in the past. Recommend diagnostic right sacroiliac joint and right piriformis trigger point injection under fluoroscopy without sedation.  Orders:  Orders Placed This  Encounter  Procedures   SACROILIAC JOINT INJECTION    Standing Status:   Future    Standing Expiration Date:   08/14/2021    Scheduling Instructions:     Right SI-J w/o sedation    Order Specific Question:   Where will this procedure be performed?    Answer:   [facility]   TRIGGER POINT INJECTION    Area: Buttocks region (gluteal area) Indications: Piriformis muscle pain;  Right piriformis-syndrome; piriformis muscle spasms (D97.416). CPT code: 20552    Scheduling Instructions:     Right piriformis TPI    Order Specific Question:   Where will this procedure be performed?    Answer:   [facility]    Follow-up plan:   Return in about 1 week (around 07/22/2021) for R SI-J + Piriformis, without sedation.     Status post left L3, L4, L5, S1 RFA on 06/15/2019, R L3,4,5, S1 RFA on 06/29/2019, left posterior glenohumeral shoulder steroid injection 01/02/2020: Not effective.  Left L4 and Sprint peripheral nerve stimulation on 04/25/2020, removed 06/11/2020.  Left suprascapular nerve block 07/01/2021            Recent Visits Date Type Provider Dept  07/01/21 Procedure visit Gillis Santa, MD [facility]  06/25/21 Office Visit Gillis Santa, MD [facility]  Showing recent visits within past 90 days and meeting all other requirements Today's Visits Date Type Provider Dept  07/15/21 Office Visit Gillis Santa, MD [facility]  Showing today's visits and meeting all other requirements Future Appointments Date Type Provider Dept  08/05/21 Appointment Gillis Santa, MD [facility]  Showing future appointments within next 90 days and meeting all other requirements I discussed the assessment and treatment plan with the patient. The patient was  provided an opportunity to ask questions and all were answered. The patient agreed with the plan and demonstrated an understanding of the instructions.  Patient advised to call back or seek an in-person  evaluation if the symptoms or condition worsens.  Duration of encounter: 68mnutes.  Note by: BGillis Santa MD Date: 07/15/2021; Time: 2:36 PM

## 2021-07-16 ENCOUNTER — Telehealth: Payer: Self-pay

## 2021-07-16 NOTE — Telephone Encounter (Signed)
Ok to schedule procedure when approved.  No sedation and no blood thinners.

## 2021-07-17 ENCOUNTER — Other Ambulatory Visit: Payer: Self-pay

## 2021-07-17 ENCOUNTER — Encounter: Payer: Self-pay | Admitting: Student in an Organized Health Care Education/Training Program

## 2021-07-17 ENCOUNTER — Ambulatory Visit (HOSPITAL_BASED_OUTPATIENT_CLINIC_OR_DEPARTMENT_OTHER): Payer: Medicare Other | Admitting: Student in an Organized Health Care Education/Training Program

## 2021-07-17 ENCOUNTER — Ambulatory Visit
Admission: RE | Admit: 2021-07-17 | Discharge: 2021-07-17 | Disposition: A | Discharge status: Home / Self Care | Payer: Medicare Other | Source: Ambulatory Visit | Attending: Student in an Organized Health Care Education/Training Program | Admitting: Student in an Organized Health Care Education/Training Program

## 2021-07-17 VITALS — BP 149/86 | HR 95 | Temp 97.2°F | Resp 18 | Ht 66.0 in | Wt 130.0 lb

## 2021-07-17 DIAGNOSIS — M25551 Pain in right hip: Secondary | ICD-10-CM | POA: Diagnosis not present

## 2021-07-17 DIAGNOSIS — M47818 Spondylosis without myelopathy or radiculopathy, sacral and sacrococcygeal region: Secondary | ICD-10-CM

## 2021-07-17 DIAGNOSIS — M533 Sacrococcygeal disorders, not elsewhere classified: Secondary | ICD-10-CM | POA: Insufficient documentation

## 2021-07-17 DIAGNOSIS — G894 Chronic pain syndrome: Secondary | ICD-10-CM

## 2021-07-17 DIAGNOSIS — Z885 Allergy status to narcotic agent status: Secondary | ICD-10-CM | POA: Diagnosis not present

## 2021-07-17 DIAGNOSIS — M461 Sacroiliitis, not elsewhere classified: Secondary | ICD-10-CM

## 2021-07-17 DIAGNOSIS — G5701 Lesion of sciatic nerve, right lower limb: Secondary | ICD-10-CM | POA: Diagnosis not present

## 2021-07-17 MED ORDER — IOHEXOL 180 MG/ML  SOLN
10.0000 mL | Freq: Once | INTRAMUSCULAR | Status: AC
  Administered 2021-07-17: 10 mL via INTRA_ARTICULAR

## 2021-07-17 MED ORDER — LIDOCAINE HCL 2 % IJ SOLN
20.0000 mL | Freq: Once | INTRAMUSCULAR | Status: AC
  Administered 2021-07-17: 100 mg

## 2021-07-17 MED ORDER — DEXAMETHASONE SODIUM PHOSPHATE 10 MG/ML IJ SOLN
10.0000 mg | Freq: Once | INTRAMUSCULAR | Status: AC
  Administered 2021-07-17: 10 mg
  Filled 2021-07-17: qty 1

## 2021-07-17 MED ORDER — METHYLPREDNISOLONE ACETATE 40 MG/ML IJ SUSP
40.0000 mg | Freq: Once | INTRAMUSCULAR | Status: AC
  Administered 2021-07-17: 40 mg via INTRA_ARTICULAR
  Filled 2021-07-17: qty 1

## 2021-07-17 MED ORDER — ROPIVACAINE HCL 2 MG/ML IJ SOLN
9.0000 mL | Freq: Once | INTRAMUSCULAR | Status: AC
  Administered 2021-07-17: 9 mL via PERINEURAL

## 2021-07-17 NOTE — Progress Notes (Signed)
Safety precautions to be maintained throughout the outpatient stay will include: orient to surroundings, keep bed in low position, maintain call bell within reach at all times, provide assistance with transfer out of bed and ambulation.  

## 2021-07-17 NOTE — Progress Notes (Signed)
PROVIDER NOTE: Information contained herein reflects review and annotations entered in association with encounter. Interpretation of such information and data should be left to medically-trained personnel. Information provided to patient can be located elsewhere in the medical record under "Patient Instructions". Document created using STT-dictation technology, any transcriptional errors that may result from process are unintentional.    Patient: Eloy End  Service Category: Procedure  Provider: Gillis Santa, MD  DOB: [birth year]  DOS: 07/17/2021  Location: [facility]  MRN: [id number]  Setting: Ambulatory - outpatient  Referring Provider: Tracie Harrier, MD  Type: Established Patient  Specialty: Interventional Pain Management  PCP: Tracie Harrier, MD   Primary Reason for Visit: Interventional Pain Management Treatment. CC: Hip Pain (right)    Procedure:          Anesthesia, Analgesia, Anxiolysis:  Type: Diagnostic  RIGHT Sacroiliac Joint Steroid Injection         and RIGHT Piriformis TPI Region: Inferior Lumbosacral Region Level: PIIS (Posterior Inferior Iliac Spine) Laterality: Right-Side  Type: Local Anesthesia Local Anesthetic: Lidocaine 1-2%    Position: Prone           Indications: 1. Sacroiliac joint pain   2. SI joint arthritis   3. Piriformis syndrome of right side   4. Chronic pain syndrome    Pain Score: Pre-procedure: 7 /10 Post-procedure: 4 /10     Pre-op H&P Assessment:  Ms. Eichstadt is a 65 y.o. (year old), female patient, seen today for interventional treatment. She  has a past surgical history that includes Abdominal hysterectomy; Colonoscopy (11/03/1998); Eye surgery (Bilateral, 11/03/2012); Artery Biopsy (Left, 12/18/2015); and melanoma excisison (Right). Ms. Bracey has a current medication list which includes the following prescription(s): acetaminophen, alendronate, vitamin c, aspirin-acetaminophen-caffeine, calcium carb-cholecalciferol,  vitamin d3, cvs vitamin b12, dicyclomine, ferrous gluconate, gabapentin, inulin, magnesium, meloxicam, methylprednisolone, multivitamin with minerals, nortriptyline, oxycodone-acetaminophen, pantoprazole, tizanidine, topiramate, zolpidem, amoxicillin-clavulanate, and ibuprofen. Her primarily concern today is the Hip Pain (right)  Initial Vital Signs:  Pulse/HCG Rate: 95ECG Heart Rate: 85 Temp: (!) 97.2 F (36.2 C) Resp: 16 BP: (!) 132/56 SpO2: 98 %  BMI: Estimated body mass index is 20.98 kg/m as calculated from the following:   Height as of this encounter: '5\' 6"'$  (1.676 m).   Weight as of this encounter: 130 lb (59 kg).  Risk Assessment: Allergies: Reviewed. She is allergic to elastic bandages & [zinc] and morphine.  Allergy Precautions: None required Coagulopathies: Reviewed. None identified.  Blood-thinner therapy: None at this time Active Infection(s): Reviewed. None identified. Ms. Zigan is afebrile  Site Confirmation: Ms. Looby was asked to confirm the procedure and laterality before marking the site Procedure checklist: Completed Consent: Before the procedure and under the influence of no sedative(s), amnesic(s), or anxiolytics, the patient was informed of the treatment options, risks and possible complications. To fulfill our ethical and legal obligations, as recommended by the American Medical Association's Code of Ethics, I have informed the patient of my clinical impression; the nature and purpose of the treatment or procedure; the risks, benefits, and possible complications of the intervention; the alternatives, including doing nothing; the risk(s) and benefit(s) of the alternative treatment(s) or procedure(s); and the risk(s) and benefit(s) of doing nothing. The patient was provided information about the general risks and possible complications associated with the procedure. These may include, but are not limited to: failure to achieve desired goals, infection, bleeding, organ  or nerve damage, allergic reactions, paralysis, and death. In addition, the patient was informed of those risks  and complications associated to the procedure, such as failure to decrease pain; infection; bleeding; organ or nerve damage with subsequent damage to sensory, motor, and/or autonomic systems, resulting in permanent pain, numbness, and/or weakness of one or several areas of the body; allergic reactions; (i.e.: anaphylactic reaction); and/or death. Furthermore, the patient was informed of those risks and complications associated with the medications. These include, but are not limited to: allergic reactions (i.e.: anaphylactic or anaphylactoid reaction(s)); adrenal axis suppression; blood sugar elevation that in diabetics may result in ketoacidosis or comma; water retention that in patients with history of congestive heart failure may result in shortness of breath, pulmonary edema, and decompensation with resultant heart failure; weight gain; swelling or edema; medication-induced neural toxicity; particulate matter embolism and blood vessel occlusion with resultant organ, and/or nervous system infarction; and/or aseptic necrosis of one or more joints. Finally, the patient was informed that Medicine is not an exact science; therefore, there is also the possibility of unforeseen or unpredictable risks and/or possible complications that may result in a catastrophic outcome. The patient indicated having understood very clearly. We have given the patient no guarantees and we have made no promises. Enough time was given to the patient to ask questions, all of which were answered to the patient's satisfaction. Ms. Devane has indicated that she wanted to continue with the procedure. Attestation: I, the ordering provider, attest that I have discussed with the patient the benefits, risks, side-effects, alternatives, likelihood of achieving goals, and potential problems during recovery for the procedure that I have  provided informed consent. Date  Time: 07/17/2021 10:13 AM  Pre-Procedure Preparation:  Monitoring: As per clinic protocol. Respiration, ETCO2, SpO2, BP, heart rate and rhythm monitor placed and checked for adequate function Safety Precautions: Patient was assessed for positional comfort and pressure points before starting the procedure. Time-out: I initiated and conducted the "Time-out" before starting the procedure, as per protocol. The patient was asked to participate by confirming the accuracy of the "Time Out" information. Verification of the correct person, site, and procedure were performed and confirmed by me, the nursing staff, and the patient. "Time-out" conducted as per Joint Commission's Universal Protocol (UP.01.01.01). Time: 1125  Description of Procedure:          Target Area: Inferior, posterior, aspect of the sacroiliac fissure Approach: Posterior, paraspinal, ipsilateral approach. Area Prepped: Entire Lower Lumbosacral Region DuraPrep (Iodine Povacrylex [0.7% available iodine] and Isopropyl Alcohol, 74% w/w) Safety Precautions: Aspiration looking for blood return was conducted prior to all injections. At no point did we inject any substances, as a needle was being advanced. No attempts were made at seeking any paresthesias. Safe injection practices and needle disposal techniques used. Medications properly checked for expiration dates. SDV (single dose vial) medications used. Description of the Procedure: Protocol guidelines were followed. The patient was placed in position over the procedure table. The target area was identified and the area prepped in the usual manner. Skin & deeper tissues infiltrated with local anesthetic. Appropriate amount of time allowed to pass for local anesthetics to take effect. The procedure needle was advanced under fluoroscopic guidance into the sacroiliac joint until a firm endpoint was obtained. Proper needle placement secured. Negative aspiration  confirmed. Solution injected in intermittent fashion, asking for systemic symptoms every 0.5cc of injectate. The needles were then removed and the area cleansed, making sure to leave some of the prepping solution back to take advantage of its long term bactericidal properties. Vitals:   07/17/21 1020 07/17/21 1125 07/17/21 1130  07/17/21 1133  BP: (!) 132/56 (!) 148/80 (!) 142/84 (!) 149/86  Pulse: 95     Resp: '16 18 16 18  '$ Temp: (!) 97.2 F (36.2 C)     TempSrc: Temporal     SpO2: 98% 100% 100% 100%  Weight: 130 lb (59 kg)     Height: '5\' 6"'$  (1.676 m)       Start Time: 1125 hrs. End Time: 1133 hrs. Materials:  Needle(s) Type: Spinal Needle Gauge: 22G Length: 3.5-in Medication(s): Please see orders for medications and dosing details. 5 cc solution made of 4 cc of 0.2% ropivacaine, 1 cc of methylprednisolone 40 mg/cc.  This was injected into the right SI joint.  A piriformis injection was also performed 1 cm inferior, 1 cm lateral and 1 cm deep to the inferior sacroiliac fissure with contrast highlighting piriformis muscle striations.  5 cc solution made of 4 cc of 0.2% ropivacaine, 1 cc of Decadron 10 mg/cc was injected into the right piriformis muscle under fluoroscopy.  Patient denied any pain radiating down her right leg during injection. Imaging Guidance (Non-Spinal):          Type of Imaging Technique: Fluoroscopy Guidance (Non-Spinal) Indication(s): Assistance in needle guidance and placement for procedures requiring needle placement in or near specific anatomical locations not easily accessible without such assistance. Exposure Time: Please see nurses notes. Contrast: Before injecting any contrast, we confirmed that the patient did not have an allergy to iodine, shellfish, or radiological contrast. Once satisfactory needle placement was completed at the desired level, radiological contrast was injected. Contrast injected under live fluoroscopy. No contrast complications. See chart for  type and volume of contrast used. Fluoroscopic Guidance: I was personally present during the use of fluoroscopy. "Tunnel Vision Technique" used to obtain the best possible view of the target area. Parallax error corrected before commencing the procedure. "Direction-depth-direction" technique used to introduce the needle under continuous pulsed fluoroscopy. Once target was reached, antero-posterior, oblique, and lateral fluoroscopic projection used confirm needle placement in all planes. Images permanently stored in EMR. Interpretation: I personally interpreted the imaging intraoperatively. Adequate needle placement confirmed in multiple planes. Appropriate spread of contrast into desired area was observed. No evidence of afferent or efferent intravascular uptake. Permanent images saved into the patient's record.   Post-operative Assessment:  Post-procedure Vital Signs:  Pulse/HCG Rate: 9586 Temp:  (!) 97.2 F (36.2 C) Resp: 18 BP:  (!) 149/86 SpO2: 100 %  EBL: None  Complications: No immediate post-treatment complications observed by team, or reported by patient.  Note: The patient tolerated the entire procedure well. A repeat set of vitals were taken after the procedure and the patient was kept under observation following institutional policy, for this type of procedure. Post-procedural neurological assessment was performed, showing return to baseline, prior to discharge. The patient was provided with post-procedure discharge instructions, including a section on how to identify potential problems. Should any problems arise concerning this procedure, the patient was given instructions to immediately contact us, at any time, without hesitation. In any case, we plan to contact the patient by telephone for a follow-up status report regarding this interventional procedure.  Comments:  No additional relevant information.  Plan of Care  Orders:  Orders Placed This Encounter  Procedures   DG  PAIN [facility]IN[facility] C-ARM 1-60 MIN NO REPORT    Intraoperative interpretation by procedural physician at [facility].    Standing Status:   Standing    Number of Occurrences:   1  Order Specific Question:   Reason for exam:    Answer:   Assistance in needle guidance and placement for procedures requiring needle placement in or near specific anatomical locations not easily accessible without such assistance.    Medications ordered for procedure: Meds ordered this encounter  Medications   iohexol (OMNIPAQUE) 180 MG/ML injection 10 mL    Must be Myelogram-compatible. If not available, you may substitute with a water-soluble, non-ionic, hypoallergenic, myelogram-compatible radiological contrast medium.   lidocaine (XYLOCAINE) 2 % (with pres) injection 400 mg   ropivacaine (PF) 2 mg/mL (0.2%) (NAROPIN) injection 9 mL   dexamethasone (DECADRON) injection 10 mg   methylPREDNISolone acetate (DEPO-MEDROL) injection 40 mg   Medications administered: We administered iohexol, lidocaine, ropivacaine (PF) 2 mg/mL (0.2%), dexamethasone, and methylPREDNISolone acetate.  See the medical record for exact dosing, route, and time of administration.  Follow-up plan:   Return for Keep sch. appt.       Status post left L3, L4, L5, S1 RFA on 06/15/2019, R L3,4,5, S1 RFA on 06/29/2019, left posterior glenohumeral shoulder steroid injection 01/02/2020: Not effective.  Left L4 and Sprint peripheral nerve stimulation on 04/25/2020, removed 06/11/2020.  Left suprascapular nerve block 07/01/2021             Recent Visits Date Type Provider Dept  07/15/21 Office Visit Gillis Santa, MD [facility]  07/01/21 Procedure visit Gillis Santa, MD [facility]  06/25/21 Office Visit Gillis Santa, MD [facility]  Showing recent visits within past 90 days and meeting all other requirements Today's Visits Date Type Provider Dept  07/17/21 Procedure visit Gillis Santa, MD [facility]   Showing today's visits and meeting all other requirements Future Appointments Date Type Provider Dept  08/05/21 Appointment Gillis Santa, MD [facility]  Showing future appointments within next 90 days and meeting all other requirements Disposition: Discharge home  Discharge (Date  Time): 07/17/2021; 1145 hrs.   Primary Care Physician: Tracie Harrier, MD Location: [facility] Note by: Gillis Santa, MD Date: 07/17/2021; Time: 1:55 PM  Disclaimer:  Medicine is not an exact science. The only guarantee in medicine is that nothing is guaranteed. It is important to note that the decision to proceed with this intervention was based on the information collected from the patient. The Data and conclusions were drawn from the patient's questionnaire, the interview, and the physical examination. Because the information was provided in large part by the patient, it cannot be guaranteed that it has not been purposely or unconsciously manipulated. Every effort has been made to obtain as much relevant data as possible for this evaluation. It is important to note that the conclusions that lead to this procedure are derived in large part from the available data. Always take into account that the treatment will also be dependent on availability of resources and existing treatment guidelines, considered by other Pain Management Practitioners as being common knowledge and practice, at the time of the intervention. For Medico-Legal purposes, it is also important to point out that variation in procedural techniques and pharmacological choices are the acceptable norm. The indications, contraindications, technique, and results of the above procedure should only be interpreted and judged by a Board-Certified Interventional Pain Specialist with extensive familiarity and expertise in the same exact procedure and technique.

## 2021-07-17 NOTE — Patient Instructions (Signed)
Pain Management Discharge Instructions  General Discharge Instructions :  If you need to reach your doctor call: Monday-Friday 8:00 am - 4:00 pm at [phone] or toll free [phone].  After clinic hours [phone] to have operator reach doctor.  Bring all of your medication bottles to all your appointments in the pain clinic.  To cancel or reschedule your appointment with Pain Management please remember to call 24 hours in advance to avoid a fee.  Refer to the educational materials which you have been given on: General Risks, I had my Procedure. Discharge Instructions, Post Sedation.  Post Procedure Instructions:  The drugs you were given will stay in your system until tomorrow, so for the next 24 hours you should not drive, make any legal decisions or drink any alcoholic beverages.  You may eat anything you prefer, but it is better to start with liquids then soups and crackers, and gradually work up to solid foods.  Please notify your doctor immediately if you have any unusual bleeding, trouble breathing or pain that is not related to your normal pain.  Depending on the type of procedure that was done, some parts of your body may feel week and/or numb.  This usually clears up by tonight or the next day.  Walk with the use of an assistive device or accompanied by an adult for the 24 hours.  You may use ice on the affected area for the first 24 hours.  Put ice in a Ziploc bag and cover with a towel and place against area 15 minutes on 15 minutes off.  You may switch to heat after 24 hours.Sacroiliac (SI) Joint Injection Patient Information  Description: The sacroiliac joint connects the scrum (very low back and tailbone) to the ilium (a pelvic bone which also forms half of the hip joint).  Normally this joint experiences very little motion.  When this joint becomes inflamed or unstable low back and or hip and pelvis pain may result.  Injection of this joint with local anesthetics  (numbing medicines) and steroids can provide diagnostic information and reduce pain.  This injection is performed with the aid of x-ray guidance into the tailbone area while you are lying on your stomach.   You may experience an electrical sensation down the leg while this is being done.  You may also experience numbness.  We also may ask if we are reproducing your normal pain during the injection.  Conditions which may be treated SI injection:  Low back, buttock, hip or leg pain  Preparation for the Injection:  Do not eat any solid food or dairy products within 8 hours of your appointment.  You may drink clear liquids up to 3 hours before appointment.  Clear liquids include water, black coffee, juice or soda.  No milk or cream please. You may take your regular medications, including pain medications with a sip of water before your appointment.  Diabetics should hold regular insulin (if take separately) and take 1/2 normal NPH dose the morning of the procedure.  Carry some sugar containing items with you to your appointment. A driver must accompany you and be prepared to drive you home after your procedure. Bring all of your current medications with you. An IV may be inserted and sedation may be given at the discretion of the physician. A blood pressure cuff, EKG and other monitors will often be applied during the procedure.  Some patients may need to have extra oxygen administered for a short period.  You will   be asked to provide medical information, including your allergies, prior to the procedure.  We must know immediately if you are taking blood thinners (like Coumadin/Warfarin) or if you are allergic to IV iodine contrast (dye).  We must know if you could possible be pregnant.  Possible side effects:  Bleeding from needle site Infection (rare, may require surgery) Nerve injury (rare) Numbness & tingling (temporary) A brief convulsion or seizure Light-headedness (temporary) Pain at  injection site (several days) Decreased blood pressure (temporary) Weakness in the leg (temporary)   Call if you experience:  New onset weakness or numbness of an extremity below the injection site that last more than 8 hours. Hives or difficulty breathing ( go to the emergency room) Inflammation or drainage at the injection site Any new symptoms which are concerning to you  Please note:  Although the local anesthetic injected can often make your back/ hip/ buttock/ leg feel good for several hours after the injections, the pain will likely return.  It takes 3-7 days for steroids to work in the sacroiliac area.  You may not notice any pain relief for at least that one week.  If effective, we will often do a series of three injections spaced 3-6 weeks apart to maximally decrease your pain.  After the initial series, we generally will wait some months before a repeat injection of the same type.  If you have any questions, please call [phone] [facility]   

## 2021-07-18 ENCOUNTER — Telehealth: Payer: Self-pay | Admitting: *Deleted

## 2021-07-18 NOTE — Telephone Encounter (Signed)
No problems post procedure. 

## 2021-07-19 ENCOUNTER — Telehealth: Payer: Self-pay | Admitting: Student in an Organized Health Care Education/Training Program

## 2021-07-19 NOTE — Telephone Encounter (Signed)
Patient called requesting to speak with nurse. Has questions. Had procedure on 07-17-21. No helping, also is she supposed to start exercising, wants to know what she can do to help with pain.

## 2021-07-22 NOTE — Telephone Encounter (Signed)
Patient instructed on Dr. Elwyn Lade advise. Also advised that it may be too soon to see any relief from the injection. She will wait 1-2 days and call to schedule Toradol/Norflex if pain does not get better.

## 2021-07-23 ENCOUNTER — Encounter: Payer: Self-pay | Admitting: Student in an Organized Health Care Education/Training Program

## 2021-07-23 ENCOUNTER — Telehealth: Payer: Self-pay | Admitting: Student in an Organized Health Care Education/Training Program

## 2021-07-23 ENCOUNTER — Ambulatory Visit
Payer: Medicare Other | Attending: Student in an Organized Health Care Education/Training Program | Admitting: Student in an Organized Health Care Education/Training Program

## 2021-07-23 ENCOUNTER — Other Ambulatory Visit: Payer: Self-pay

## 2021-07-23 VITALS — BP 132/58 | HR 88 | Temp 97.3°F | Resp 16 | Ht 66.0 in | Wt 130.0 lb

## 2021-07-23 DIAGNOSIS — M47818 Spondylosis without myelopathy or radiculopathy, sacral and sacrococcygeal region: Secondary | ICD-10-CM | POA: Diagnosis present

## 2021-07-23 DIAGNOSIS — M533 Sacrococcygeal disorders, not elsewhere classified: Secondary | ICD-10-CM

## 2021-07-23 DIAGNOSIS — G894 Chronic pain syndrome: Secondary | ICD-10-CM | POA: Diagnosis present

## 2021-07-23 DIAGNOSIS — G5701 Lesion of sciatic nerve, right lower limb: Secondary | ICD-10-CM | POA: Insufficient documentation

## 2021-07-23 MED ORDER — ORPHENADRINE CITRATE 30 MG/ML IJ SOLN
30.0000 mg | Freq: Once | INTRAMUSCULAR | Status: AC
  Administered 2021-07-23: 30 mg via INTRAMUSCULAR

## 2021-07-23 MED ORDER — KETOROLAC TROMETHAMINE 30 MG/ML IJ SOLN
30.0000 mg | Freq: Once | INTRAMUSCULAR | Status: AC
  Administered 2021-07-23: 30 mg via INTRAMUSCULAR

## 2021-07-23 MED ORDER — ORPHENADRINE CITRATE 30 MG/ML IJ SOLN
INTRAMUSCULAR | Status: AC
  Filled 2021-07-23: qty 2

## 2021-07-23 MED ORDER — KETOROLAC TROMETHAMINE 30 MG/ML IJ SOLN
INTRAMUSCULAR | Status: AC
  Filled 2021-07-23: qty 1

## 2021-07-23 NOTE — Progress Notes (Signed)
PROVIDER NOTE: Information contained herein reflects review and annotations entered in association with encounter. Interpretation of such information and data should be left to medically-trained personnel. Information provided to patient can be located elsewhere in the medical record under "Patient Instructions". Document created using STT-dictation technology, any transcriptional errors that may result from process are unintentional.    Patient: Teresa [name]  Service Category: E/M  Provider: Gillis Santa, MD  DOB: 1955/12/24  DOS: 07/23/2021  Specialty: Interventional Pain Management  MRN: [id number]  Setting: Ambulatory outpatient  PCP: Tracie Harrier, MD  Type: Established Patient    Referring Provider: Tracie Harrier, MD  Location: Office  Delivery: Face-to-face     HPI  [name], a 65 y.o. 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 [name] primary complain today is Pain and Back Pain (lower) Last encounter: My last encounter with her was on 07/23/2021. Pertinent problems: [name] has INSOMNIA, CHRONIC; DEPRESSION; BACK PAIN, CHRONIC; Lumbar radiculopathy (right L5); Lumbar facet arthropathy; Degeneration of lumbar or lumbosacral intervertebral disc; Chronic pain syndrome; Arthritis of left glenohumeral joint; and Left rotator cuff tear arthropathy on their pertinent problem list.  Vitals:  height is _0  (1.676 m) and weight is 130 lb (59 kg). Her temporal temperature is 97.3 F (36.3 C) (abnormal). Her blood pressure is 132/58 (abnormal) and her pulse is 88. Her respiration is 16 and oxygen saturation is 99%.   Reason for encounter: Intramuscular Norflex and Toradol for acute on chronic pain.  ROS  Constitutional: Denies any fever or chills Gastrointestinal: No reported hemesis, hematochezia, vomiting, or acute GI distress Musculoskeletal:  Lower back, bilateral buttock, bilateral hip pain Neurological: No reported episodes of  acute onset apraxia, aphasia, dysarthria, agnosia, amnesia, paralysis, loss of coordination, or loss of consciousness  Medication Review  Calcium Carb-Cholecalciferol, Inulin, Magnesium, Vitamin D3, acetaminophen, alendronate, amoxicillin-clavulanate, aspirin-acetaminophen-caffeine, cyanocobalamin, dicyclomine, ferrous gluconate, gabapentin, ibuprofen, meloxicam, methylPREDNISolone, multivitamin with minerals, nortriptyline, oxyCODONE-acetaminophen, pantoprazole, tiZANidine, topiramate, vitamin C, and zolpidem  History Review  Allergy: [name] is allergic to elastic bandages & [zinc] and morphine. Drug: [name]  reports no history of drug use. Alcohol:  reports no history of alcohol use. Tobacco:  reports that she has never smoked. She has never used smokeless tobacco. Social: [name]  reports that she has never smoked. She has never used smokeless tobacco. She reports that she does not drink alcohol and does not use drugs. Medical:  has a past medical history of Dyspnea, Headache, History of fractured vertebra (1982), Hyperlipidemia, Low bone density, Melanoma ([place]), and Transfusion of blood product refused for religious reason. Surgical: [name]  has a past surgical history that includes Abdominal hysterectomy; Colonoscopy (11/03/1998); Eye surgery (Bilateral, 11/03/2012); Artery Biopsy (Left, 12/18/2015); and melanoma excisison (Right). Family: family history includes Dementia in her mother; Heart attack in her father.  Laboratory Chemistry Profile   Renal Lab Results  Component Value Date   BUN 11 08/14/2014   CREATININE 1.00 06/17/2021   GFRAA >90 08/14/2014   GFRNONAA 89 (L) 08/14/2014    Hepatic No results found for: AST, ALT, ALBUMIN, ALKPHOS, HCVAB, AMYLASE, LIPASE, AMMONIA  Electrolytes Lab Results  Component Value Date   NA 139 08/14/2014   K 4.0 08/14/2014   CL 99 08/14/2014   CALCIUM 10.2 08/14/2014    Bone No results found for: VD25OH, VD125OH2TOT,  HQ4696EX5, MW4132GM0, 25OHVITD1, 25OHVITD2, 25OHVITD3, TESTOFREE, TESTOSTERONE  Inflammation (CRP: Acute Phase) (ESR: Chronic Phase) No results found  for: CRP, ESRSEDRATE, LATICACIDVEN       Note: Above Lab results reviewed.   Assessment   Status Diagnosis  Having a Flare-up Having a Flare-up Having a Flare-up 1. SI joint arthritis   2. Sacroiliac joint pain   3. Piriformis syndrome of right side   4. Chronic pain syndrome      Plan of Care    [name] has a current medication list which includes the following long-term medication(s): calcium carb-cholecalciferol, dicyclomine, ferrous gluconate, pantoprazole, topiramate, and zolpidem.  Pharmacotherapy (Medications Ordered): Meds ordered this encounter  Medications   orphenadrine (NORFLEX) injection 30 mg   ketorolac (TORADOL) 30 MG/ML injection 30 mg     Follow-up plan:   Return for Keep sch. appt.     Status post left L3, L4, L5, S1 RFA on 06/15/2019, R L3,4,5, S1 RFA on 06/29/2019, left posterior glenohumeral shoulder steroid injection 01/02/2020: Not effective.  Left L4 and Sprint peripheral nerve stimulation on 04/25/2020, removed 06/11/2020.  Left suprascapular nerve block 07/01/2021              Recent Visits Date Type Provider Dept  07/17/21 Procedure visit Gillis Santa, MD [facility]  07/15/21 Office Visit Gillis Santa, MD [facility]  07/01/21 Procedure visit Gillis Santa, MD [facility]  06/25/21 Office Visit Gillis Santa, MD [facility]  Showing recent visits within past 90 days and meeting all other requirements Today's Visits Date Type Provider Dept  07/23/21 Procedure visit Gillis Santa, MD [facility]  Showing today's visits and meeting all other requirements Future Appointments Date Type Provider Dept  08/05/21 Appointment Gillis Santa, MD [facility]  Showing future appointments within next 90 days and meeting all other  requirements I discussed the assessment and treatment plan with the patient. The patient was provided an opportunity to ask questions and all were answered. The patient agreed with the plan and demonstrated an understanding of the instructions.  Patient advised to call back or seek an in-person evaluation if the symptoms or condition worsens.  Duration of encounter: 38mnutes.  Note by: BGillis Santa MD Date: 07/23/2021; Time: 10:55 AM

## 2021-07-23 NOTE — Telephone Encounter (Signed)
Coming in at about 1030 for Toradol/Norflex.  Already approved by Dr. Holley Raring.

## 2021-08-01 ENCOUNTER — Encounter: Payer: Self-pay | Admitting: Student in an Organized Health Care Education/Training Program

## 2021-08-05 ENCOUNTER — Other Ambulatory Visit: Payer: Self-pay

## 2021-08-05 ENCOUNTER — Ambulatory Visit
Payer: Medicare Other | Attending: Student in an Organized Health Care Education/Training Program | Admitting: Student in an Organized Health Care Education/Training Program

## 2021-08-05 DIAGNOSIS — M47818 Spondylosis without myelopathy or radiculopathy, sacral and sacrococcygeal region: Secondary | ICD-10-CM | POA: Diagnosis not present

## 2021-08-05 DIAGNOSIS — G894 Chronic pain syndrome: Secondary | ICD-10-CM

## 2021-08-05 DIAGNOSIS — M47816 Spondylosis without myelopathy or radiculopathy, lumbar region: Secondary | ICD-10-CM | POA: Diagnosis not present

## 2021-08-05 DIAGNOSIS — M533 Sacrococcygeal disorders, not elsewhere classified: Secondary | ICD-10-CM | POA: Diagnosis not present

## 2021-08-05 NOTE — Progress Notes (Signed)
Patient: Teresa [name]  Service Category: E/M  Provider: Gillis Santa, MD  DOB: 1956-02-10  DOS: 08/05/2021  Location: Office  MRN: [id number]  Setting: Ambulatory outpatient  Referring Provider: Tracie Harrier, MD  Type: Established Patient  Specialty: Interventional Pain Management  PCP: Tracie Harrier, MD  Location: Remote location  Delivery: TeleHealth     Virtual Encounter - Pain Management PROVIDER NOTE: Information contained herein reflects review and annotations entered in association with encounter. Interpretation of such information and data should be left to medically-trained personnel. Information provided to patient can be located elsewhere in the medical record under "Patient Instructions". Document created using STT-dictation technology, any transcriptional errors that may result from process are unintentional.    Contact & Pharmacy Preferred: [place]: [phone] (home) Mobile: [phone] (mobile) E-mail: [email or web address]  [id number] [place] [address] [id number]: [phone]: [phone] [id number] [address]: [phone]: [phone] [id number] [place], [place] [address] [place]: [phone]: [phone] Pre-screening  Ms. AsClippardffered "in-person" vs "virtual" encounter. She indicated preferring virtual for this encounter.   Reason COVID-19*  Social distancing based on CDC and AMA recommendations.   I contacted DeEloy Endn 08/05/2021 via telephone.      I clearly identified myself as BiGillis SantaMD. I verified that I was speaking with the correct person using two identifiers (Name: Teresa SKILTONand date of birth: 7/Jan 06, 1956  Consent I sought verbal advanced consent from [name]  virtual visit interactions. I informed [name] possible security and privacy concerns, risks, and limitations associated with providing "not-in-person" medical evaluation and management services. I also informed Ms. AsTsouf the availability of "in-person" appointments. Finally, I informed her that there would be a charge for the virtual visit and that she could be  personally, fully or partially, financially responsible for it. Ms. AsHalvorsonxpressed understanding and agreed to proceed.   Historic Elements   Ms. DeKHILEY LIESERs a [age].o. year old, female patient evaluated today after our last contact on 07/23/2021. Ms. AsNeriahas a past medical history of Dyspnea, Headache, History of fractured vertebra (1982), Hyperlipidemia, Low bone density, [place]oma [place] and Transfusion of blood product refused for religious reason. She also  has a past surgical history that includes Abdominal hysterectomy; Colonoscopy (11/03/1998); Eye surgery (Bilateral, 11/03/2012); Artery Biopsy (Left, 12/18/2015); and melanoma excisison (Right). Ms. AsMulaas a current medication list which includes the following prescription(s): acetaminophen, alendronate, vitamin c, aspirin-acetaminophen-caffeine, calcium carb-cholecalciferol, vitamin d3, dicyclomine, ferrous gluconate, gabapentin, inulin, magnesium, multivitamin with minerals, oxycodone-acetaminophen, pantoprazole, tizanidine, topiramate, zolpidem, amoxicillin-clavulanate, [facility] vitamin b12, ibuprofen, methylprednisolone, and nortriptyline. She  reports that she has never smoked. She has never used smokeless tobacco. She reports that she does not drink alcohol and does not use drugs. Ms. AsBurkempers allergic to elastic bandages & [zinc] and morphine.   HPI  Today, she is being contacted for a post-procedure assessment.   Post-Procedure Evaluation  Procedure (07/23/2021):  Right SI-J + Right Piriformis  Anxiolysis: Please see nurses note.  Effectiveness during initial  hour after procedure (Ultra-Short Term Relief): 10 %   Local anesthetic used: Long-acting (4-6 hours) Effectiveness: Defined as any analgesic benefit obtained secondary to the administration of  local anesthetics. This carries significant diagnostic value as to the etiological location, or anatomical origin, of the pain. Duration of benefit is expected to coincide with the duration of the local anesthetic used.  Effectiveness during initial 4-6 hours after procedure (Short-Term Relief): 0 %   Long-term benefit: Defined as any relief past the pharmacologic duration of the local anesthetics.  Effectiveness past the initial 6 hours after procedure (Long-Term Relief): 70 %   Benefits, current: Defined as benefit present at the time of this evaluation.   Analgesia:  65-70% Function: increased ROM    Laboratory Chemistry Profile   Renal Lab Results  Component Value Date   BUN 11 08/14/2014   CREATININE 1.00 06/17/2021   GFRAA >90 08/14/2014   GFRNONAA 89 (L) 08/14/2014    Hepatic No results found for: AST, ALT, ALBUMIN, ALKPHOS, HCVAB, AMYLASE, LIPASE, AMMONIA  Electrolytes Lab Results  Component Value Date   NA 139 08/14/2014   K 4.0 08/14/2014   CL 99 08/14/2014   CALCIUM 10.2 08/14/2014    Bone No results found for: VD25OH, VD125OH2TOT, NL9767HA1, PF7902IO9, 25OHVITD1, 25OHVITD2, 25OHVITD3, TESTOFREE, TESTOSTERONE  Inflammation (CRP: Acute Phase) (ESR: Chronic Phase) No results found for: CRP, ESRSEDRATE, LATICACIDVEN       Note: Above Lab results reviewed.  Assessment  The primary encounter diagnosis was Lumbar spondylosis. Diagnoses of Lumbar facet arthropathy (L3/4 and L4/5), Sacroiliac joint pain, SI joint arthritis, and Chronic pain syndrome were also pertinent to this visit.  Plan of Care    Teresa [name] has a current medication list which includes the following long-term medication(s): calcium carb-cholecalciferol, dicyclomine, ferrous gluconate, pantoprazole,  topiramate, and zolpidem.  While the sacroiliac joint injection and piriformis injection were helpful, patient thinks that the right piriformis injection caused acute pain.  She had to come in after her procedure to receive intramuscular Norflex and Toradol which Kedra states that seemed to calm her pain down.  She is endorsing approximately 70% pain relief in her right SI joint but continues to endorse bilateral low back pain related to lumbar facet joint syndrome.  She is interested in repeating therapeutic lumbar facet medial branch nerve blocks as well as right sacroiliac joint injection.  Risks and benefits of both of these procedures were reviewed and patient would like to proceed.  We will plan on doing this without sedation.  Orders:  Orders Placed This Encounter  Procedures   LUMBAR FACET(MEDIAL BRANCH NERVE BLOCK) MBNB    Standing Status:   Future    Standing Expiration Date:   09/05/2021    Scheduling Instructions:     Procedure: Lumbar facet block (AKA.: Lumbosacral medial branch nerve block)     Side: Bilateral     Level: L3-4, L4-5, Facets (L3, L4, L5, edial Branch Nerves)     Sedation: Patient's choice.     Timeframe: ASAA    Order Specific Question:   Where will this procedure be performed?    Answer:   [facility]   SACROILIAC JOINT INJECTION    Standing Status:   Future    Standing Expiration Date:   09/05/2021    Scheduling Instructions:     Side: Right    Order Specific Question:   Where will this procedure be performed?    Answer:   [facility]    Follow-up plan:   Return in about 1 week (around 08/12/2021) for B/L L3, 4, 5, Facets, Right SI-J (40 mins), without sedation.     Status  post left L3, L4, L5, S1 RFA on 06/15/2019, R L3,4,5, S1 RFA on 06/29/2019, left posterior glenohumeral shoulder steroid injection 01/02/2020: Not effective.  Left L4 and Sprint peripheral nerve stimulation on 04/25/2020, removed 06/11/2020.  Left suprascapular nerve block  07/01/2021               Recent Visits Date Type Provider Dept  07/23/21 Procedure visit Gillis Santa, MD [facility]  07/17/21 Procedure visit Gillis Santa, MD [facility]  07/15/21 Office Visit Gillis Santa, MD [facility]  07/01/21 Procedure visit Gillis Santa, MD [facility]  06/25/21 Office Visit Gillis Santa, MD [facility]  Showing recent visits within past 90 days and meeting all other requirements Today's Visits Date Type Provider Dept  08/05/21 Office Visit Gillis Santa, MD [facility]  Showing today's visits and meeting all other requirements Future Appointments No visits were found meeting these conditions. Showing future appointments within next 90 days and meeting all other requirements I discussed the assessment and treatment plan with the patient. The patient was provided an opportunity to ask questions and all were answered. The patient agreed with the plan and demonstrated an understanding of the instructions.  Patient advised to call back or seek an in-person evaluation if the symptoms or condition worsens.  Duration of encounter: 18mnutes.  Note by: BGillis Santa MD Date: 08/05/2021; Time: 1:17 PM

## 2021-08-06 NOTE — Patient Instructions (Signed)

## 2021-08-14 ENCOUNTER — Telehealth: Payer: Self-pay | Admitting: Student in an Organized Health Care Education/Training Program

## 2021-08-14 NOTE — Telephone Encounter (Signed)
Patient states her SI joint is not hurting at all now and she wants to change procedure for Monday. States her left side lumbar spine and left shoulder are both hurting very much. Wants to see if he can do something for this instead of planned procedure. Please advise patient.

## 2021-08-14 NOTE — Telephone Encounter (Signed)
Called patient and explained that sometimes it is difficult to "change" procedures due to factors such as insurance, MD preference, time allotment, ect. I told patient that I would forward this to [employer] and that she (the patient) could call tomorrow and ask for [employer] to discuss insurance coverage if another procedure was performed. Patient agrees.

## 2021-08-14 NOTE — Telephone Encounter (Signed)
Patient has Medicare A&B, so is ok to change procedures if that is what Dr. Holley Raring wants to do.

## 2021-08-19 ENCOUNTER — Other Ambulatory Visit: Payer: Self-pay

## 2021-08-19 ENCOUNTER — Ambulatory Visit
Admission: RE | Admit: 2021-08-19 | Discharge: 2021-08-19 | Disposition: A | Discharge status: Home / Self Care | Payer: Medicare Other | Source: Ambulatory Visit | Attending: Student in an Organized Health Care Education/Training Program | Admitting: Student in an Organized Health Care Education/Training Program

## 2021-08-19 ENCOUNTER — Encounter: Payer: Self-pay | Admitting: Student in an Organized Health Care Education/Training Program

## 2021-08-19 ENCOUNTER — Ambulatory Visit (HOSPITAL_BASED_OUTPATIENT_CLINIC_OR_DEPARTMENT_OTHER): Payer: Medicare Other | Admitting: Student in an Organized Health Care Education/Training Program

## 2021-08-19 VITALS — BP 136/81 | HR 102 | Temp 97.3°F | Resp 16 | Ht 66.0 in | Wt 125.0 lb

## 2021-08-19 DIAGNOSIS — M47816 Spondylosis without myelopathy or radiculopathy, lumbar region: Secondary | ICD-10-CM | POA: Insufficient documentation

## 2021-08-19 DIAGNOSIS — G8929 Other chronic pain: Secondary | ICD-10-CM | POA: Diagnosis not present

## 2021-08-19 DIAGNOSIS — M25512 Pain in left shoulder: Secondary | ICD-10-CM

## 2021-08-19 DIAGNOSIS — G894 Chronic pain syndrome: Secondary | ICD-10-CM | POA: Diagnosis present

## 2021-08-19 DIAGNOSIS — G5682 Other specified mononeuropathies of left upper limb: Secondary | ICD-10-CM

## 2021-08-19 MED ORDER — LIDOCAINE HCL 2 % IJ SOLN
INTRAMUSCULAR | Status: AC
  Filled 2021-08-19: qty 20

## 2021-08-19 MED ORDER — LIDOCAINE HCL 2 % IJ SOLN
20.0000 mL | Freq: Once | INTRAMUSCULAR | Status: AC
  Administered 2021-08-19: 200 mg

## 2021-08-19 MED ORDER — ROPIVACAINE HCL 2 MG/ML IJ SOLN
9.0000 mL | Freq: Once | INTRAMUSCULAR | Status: AC
  Administered 2021-08-19: 20 mL via PERINEURAL

## 2021-08-19 MED ORDER — IOHEXOL 180 MG/ML  SOLN
10.0000 mL | Freq: Once | INTRAMUSCULAR | Status: AC
  Administered 2021-08-19: 5 mL via INTRA_ARTICULAR
  Filled 2021-08-19: qty 20

## 2021-08-19 MED ORDER — ROPIVACAINE HCL 2 MG/ML IJ SOLN
INTRAMUSCULAR | Status: AC
  Filled 2021-08-19: qty 20

## 2021-08-19 MED ORDER — DEXAMETHASONE SODIUM PHOSPHATE 10 MG/ML IJ SOLN
INTRAMUSCULAR | Status: AC
  Filled 2021-08-19: qty 3

## 2021-08-19 MED ORDER — DEXAMETHASONE SODIUM PHOSPHATE 10 MG/ML IJ SOLN
10.0000 mg | Freq: Once | INTRAMUSCULAR | Status: AC
  Administered 2021-08-19: 10 mg

## 2021-08-19 NOTE — Progress Notes (Signed)
Safety precautions to be maintained throughout the outpatient stay will include: orient to surroundings, keep bed in low position, maintain call bell within reach at all times, provide assistance with transfer out of bed and ambulation.  

## 2021-08-19 NOTE — Patient Instructions (Signed)

## 2021-08-19 NOTE — Progress Notes (Signed)
PROVIDER NOTE: Information contained herein reflects review and annotations entered in association with encounter. Interpretation of such information and data should be left to medically-trained personnel. Information provided to patient can be located elsewhere in the medical record under "Patient Instructions". Document created using STT-dictation technology, any transcriptional errors that may result from process are unintentional.    Patient: Teresa [name]  Service Category: Procedure  Provider: Gillis Santa, MD  DOB: September 23, 1956  DOS: 08/19/2021  Location: [facility]  MRN: [id number]  Setting: Ambulatory - outpatient  Referring Provider: Tracie Harrier, MD  Type: Established Patient  Specialty: Interventional Pain Management  PCP: Tracie Harrier, MD   Primary Reason for Visit: Interventional Pain Management Treatment. CC: Shoulder Pain (left) and Back Pain   Procedure #1:  Anesthesia, Analgesia, Anxiolysis:  Type: Lumbar Facet, Medial Branch Block(s)  Primary Purpose: Therapeutic Region: Posterolateral Lumbosacral Spine Level: L3, L4, L5, Medial Branch Level(s). Injecting these levels blocks the L3-4, L4-5, lumbar facet joints. Laterality: Bilateral  Type: Local Anesthesia  Local Anesthetic: Lidocaine 1-2%  Position: Prone   Indications: 1. Lumbar spondylosis   2. Lumbar facet arthropathy (L3/4 and L4/5)   3. Chronic left shoulder pain   4. Neuropathy of left suprascapular nerve   5. Chronic pain syndrome    Pain Score: Pre-procedure: 6 /10 Post-procedure: 6 /10   Procedure #2:  Anesthesia, Analgesia, Anxiolysis:  Type: Therapeutic Suprascapular nerve Block #2  Primary Purpose: Therapeutic Region: Posterior Shoulder & Scapular Areas Level: Superior to the scapular spine, in the lateral aspect of the supraspinatus fossa (Suprescapular notch). Target Area: Suprascapular nerve as it passes thru the lower portion of the suprascapular notch. Approach:  Posterior percutaneous approach. Laterality: Left-Side  Type: Local Anesthesia  Local Anesthetic: Lidocaine 1-2%  Position: Prone   Indications: Left shoulder pain, left suprascapular neuropathy  Pre-op Assessment:  Teresa [name] is a 65 y.o. (year old), female patient, seen today for interventional treatment. She  has a past surgical history that includes Abdominal hysterectomy; Colonoscopy (11/03/1998); Eye surgery (Bilateral, 11/03/2012); Artery Biopsy (Left, 12/18/2015); and melanoma excisison (Right). Teresa [name] has a current medication list which includes the following prescription(s): acetaminophen, alendronate, amoxicillin-clavulanate, vitamin c, aspirin-acetaminophen-caffeine, calcium carb-cholecalciferol, vitamin d3, cvs vitamin b12, dicyclomine, ferrous gluconate, gabapentin, inulin, magnesium, multivitamin with minerals, nortriptyline, oxycodone-acetaminophen, topiramate, zolpidem, ibuprofen, methylprednisolone, pantoprazole, and tizanidine. Her primarily concern today is the Shoulder Pain (left) and Back Pain  Initial Vital Signs:  Pulse/HCG Rate: (!) 102ECG Heart Rate: 87 Temp: (!) 97.3 F (36.3 C) Resp: 16 BP: (!) 156/67 SpO2: 95 %  BMI: Estimated body mass index is 20.18 kg/m as calculated from the following:   Height as of this encounter: 5\' 6"  (1.676 m).   Weight as of this encounter: 125 lb (56.7 kg).  Risk Assessment: Allergies: Reviewed. She is allergic to elastic bandages & [zinc] and morphine.  Allergy Precautions: None required Coagulopathies: Reviewed. None identified.  Blood-thinner therapy: None at this time Active Infection(s): Reviewed. None identified. [name] is afebrile  Site Confirmation: [name] was asked to confirm the procedure and laterality before marking the site Procedure checklist: Completed Consent: Before the procedure and under the influence of no sedative(s), amnesic(s), or anxiolytics, the patient was informed of the treatment  options, risks and possible complications. To fulfill our ethical and legal obligations, as recommended by the American Medical Association's Code of Ethics, I have informed the patient of my clinical impression; the nature and purpose of the treatment or procedure; the risks, benefits,  and possible complications of the intervention; the alternatives, including doing nothing; the risk(s) and benefit(s) of the alternative treatment(s) or procedure(s); and the risk(s) and benefit(s) of doing nothing. The patient was provided information about the general risks and possible complications associated with the procedure. These may include, but are not limited to: failure to achieve desired goals, infection, bleeding, organ or nerve damage, allergic reactions, paralysis, and death. In addition, the patient was informed of those risks and complications associated to Spine-related procedures, such as failure to decrease pain; infection (i.e.: Meningitis, epidural or intraspinal abscess); bleeding (i.e.: epidural hematoma, subarachnoid hemorrhage, or any other type of intraspinal or peri-dural bleeding); organ or nerve damage (i.e.: Any type of peripheral nerve, nerve root, or spinal cord injury) with subsequent damage to sensory, motor, and/or autonomic systems, resulting in permanent pain, numbness, and/or weakness of one or several areas of the body; allergic reactions; (i.e.: anaphylactic reaction); and/or death. Furthermore, the patient was informed of those risks and complications associated with the medications. These include, but are not limited to: allergic reactions (i.e.: anaphylactic or anaphylactoid reaction(s)); adrenal axis suppression; blood sugar elevation that in diabetics may result in ketoacidosis or comma; water retention that in patients with history of congestive heart failure may result in shortness of breath, pulmonary edema, and decompensation with resultant heart failure; weight gain; swelling or  edema; medication-induced neural toxicity; particulate matter embolism and blood vessel occlusion with resultant organ, and/or nervous system infarction; and/or aseptic necrosis of one or more joints. Finally, the patient was informed that Medicine is not an exact science; therefore, there is also the possibility of unforeseen or unpredictable risks and/or possible complications that may result in a catastrophic outcome. The patient indicated having understood very clearly. We have given the patient no guarantees and we have made no promises. Enough time was given to the patient to ask questions, all of which were answered to the patient's satisfaction. Ms. Faulkenberry has indicated that she wanted to continue with the procedure. Attestation: I, the ordering provider, attest that I have discussed with the patient the benefits, risks, side-effects, alternatives, likelihood of achieving goals, and potential problems during recovery for the procedure that I have provided informed consent. Date  Time: 08/19/2021  9:44 AM  Pre-Procedure Preparation:  Monitoring: As per clinic protocol. Respiration, ETCO2, SpO2, BP, heart rate and rhythm monitor placed and checked for adequate function Safety Precautions: Patient was assessed for positional comfort and pressure points before starting the procedure. Time-out: I initiated and conducted the "Time-out" before starting the procedure, as per protocol. The patient was asked to participate by confirming the accuracy of the "Time Out" information. Verification of the correct person, site, and procedure were performed and confirmed by me, the nursing staff, and the patient. "Time-out" conducted as per Joint Commission's Universal Protocol (UP.01.01.01). Time: 1018  Description of Procedure #1:  Laterality: Bilateral. The procedure was performed in identical fashion on both sides. Levels: L3, L4, L5, Medial Branch Level(s) Area Prepped: Posterior Lumbosacral  Region Prepping solution: ChloraPrep (2% chlorhexidine gluconate and 70% isopropyl alcohol) Safety Precautions: Aspiration looking for blood return was conducted prior to all injections. At no point did we inject any substances, as a needle was being advanced. Before injecting, the patient was told to immediately notify me if she was experiencing any new onset of "ringing in the ears, or metallic taste in the mouth". No attempts were made at seeking any paresthesias. Safe injection practices and needle disposal techniques used. Medications properly checked for  expiration dates. SDV (single dose vial) medications used. After the completion of the procedure, all disposable equipment used was discarded in the proper designated medical waste containers. Local Anesthesia: Protocol guidelines were followed. The patient was positioned over the fluoroscopy table. The area was prepped in the usual manner. The time-out was completed. The target area was identified using fluoroscopy. A 12-in long, straight, sterile hemostat was used with fluoroscopic guidance to locate the targets for each level blocked. Once located, the skin was marked with an approved surgical skin marker. Once all sites were marked, the skin (epidermis, dermis, and hypodermis), as well as deeper tissues (fat, connective tissue and muscle) were infiltrated with a small amount of a short-acting local anesthetic, loaded on a 10cc syringe with a 25G, 1.5-in  Needle. An appropriate amount of time was allowed for local anesthetics to take effect before proceeding to the next step. Local Anesthetic: Lidocaine 2.0% The unused portion of the local anesthetic was discarded in the proper designated containers. Technical explanation of process:   L3 Medial Branch Nerve Block (MBB): The target area for the L3 medial branch is at the junction of the postero-lateral aspect of the superior articular process and the superior, posterior, and medial edge of the  transverse process of L4. Under fluoroscopic guidance, a Quincke needle was inserted until contact was made with os over the superior postero-lateral aspect of the pedicular shadow (target area). After negative aspiration for blood, 97mL of the nerve block solution was injected without difficulty or complication. The needle was removed intact. L4 Medial Branch Nerve Block (MBB): The target area for the L4 medial branch is at the junction of the postero-lateral aspect of the superior articular process and the superior, posterior, and medial edge of the transverse process of L5. Under fluoroscopic guidance, a Quincke needle was inserted until contact was made with os over the superior postero-lateral aspect of the pedicular shadow (target area). After negative aspiration for blood, 1 mL of the nerve block solution was injected without difficulty or complication. The needle was removed intact. L5 Medial Branch Nerve Block (MBB): The target area for the L5 medial branch is at the junction of the postero-lateral aspect of the superior articular process and the superior, posterior, and medial edge of the sacral ala. Under fluoroscopic guidance, a Quincke needle was inserted until contact was made with os over the superior postero-lateral aspect of the pedicular shadow (target area). After negative aspiration for blood, 96mL of the nerve block solution was injected without difficulty or complication. The needle was removed intact.  Nerve block solution: 12cc solution made of 10 cc of 0.2% ropivacaine, 2 cc of Decadron 10 mg/cc.  2 cc injected at each level above bilaterally.  Total steroid dose: 20 mg Decadron Procedural Needles: 22-gauge, 3.5-inch, Quincke needles used for all levels.  Once the entire procedure was completed, the treated area was cleaned, making sure to leave some of the prepping solution back to take advantage of its long term bactericidal properties.   Illustration of the posterior view of the  lumbar spine and the posterior neural structures. Laminae of L2 through S1 are labeled. DPRL5, dorsal primary ramus of L5; DPRS1, dorsal primary ramus of S1; DPR3, dorsal primary ramus of L3; FJ, facet (zygapophyseal) joint L3-L4; I, inferior articular process of L4; LB1, lateral branch of dorsal primary ramus of L1; IAB, inferior articular branches from L3 medial branch (supplies L4-L5 facet joint); IBP, intermediate branch plexus; MB3, medial branch of dorsal primary ramus of  L3; NR3, third lumbar nerve root; S, superior articular process of L5; SAB, superior articular branches from L4 (supplies L4-5 facet joint also); TP3, transverse process of L3.  Vitals:   08/19/21 1020 08/19/21 1025 08/19/21 1030 08/19/21 1032  BP: (!) 148/86 (!) 145/83 (!) 143/85 136/81  Pulse:      Resp: 14 14 15 16   Temp:      TempSrc:      SpO2: 99% 99% 99% 99%  Weight:      Height:         Start Time: 1018 hrs. [name] Time: 1032 hrs.  Imaging Guidance (Spinal):          Type of Imaging Technique: Fluoroscopy Guidance (Spinal) Indication(s): Assistance in needle guidance and placement for procedures requiring needle placement in or near specific anatomical locations not easily accessible without such assistance. Exposure Time: Please see nurses notes. Contrast: None used. Fluoroscopic Guidance: I was personally present during the use of fluoroscopy. "Tunnel Vision Technique" used to obtain the best possible view of the target area. Parallax error corrected before commencing the procedure. "Direction-depth-direction" technique used to introduce the needle under continuous pulsed fluoroscopy. Once target was reached, antero-posterior, oblique, and lateral fluoroscopic projection used confirm needle placement in all planes. Images permanently stored in EMR. Interpretation: No contrast injected. I personally interpreted the imaging intraoperatively. Adequate needle placement confirmed in multiple planes. Permanent images  saved into the patient's record.  Post-operative Assessment:  Post-procedure Vital Signs:  Pulse/HCG Rate: (!) 10292 Temp: (!) 97.3 F (36.3 C) Resp: 16 BP: 136/81 SpO2: 99 %  EBL: None  Complications: No immediate post-treatment complications observed by team, or reported by patient.  Note: The patient tolerated the entire procedure well. A repeat set of vitals were taken after the procedure and the patient was kept under observation following institutional policy, for this type of procedure. Post-procedural neurological assessment was performed, showing return to baseline, prior to discharge. The patient was provided with post-procedure discharge instructions, including a section on how to identify potential problems. Should any problems arise concerning this procedure, the patient was given instructions to immediately contact us, at any time, without hesitation. In any case, we plan to contact the patient by telephone for a follow-up status report regarding this interventional procedure.  Comments:  No additional relevant information.  5 out of 5 strength bilateral lower extremity: Plantar flexion, dorsiflexion, knee flexion, knee extension.    Description of Procedure #2:  Area Prepped: Entire shoulder Area DuraPrep (Iodine Povacrylex [0.7% available iodine] and Isopropyl Alcohol, 74% w/w) Safety Precautions: Aspiration looking for blood return was conducted prior to all injections. At no point did we inject any substances, as a needle was being advanced. No attempts were made at seeking any paresthesias. Safe injection practices and needle disposal techniques used. Medications properly checked for expiration dates. SDV (single dose vial) medications used. Description of the Procedure: Protocol guidelines were followed. The patient was placed in position over the procedure table. The target area was identified and the area prepped in the usual manner. Skin & deeper tissues infiltrated  with local anesthetic. Appropriate amount of time allowed to pass for local anesthetics to take effect. The procedure needles were then advanced to the target area. Proper needle placement secured. Negative aspiration confirmed. Solution injected in intermittent fashion, asking for systemic symptoms every 0.5cc of injectate. The needles were then removed and the area cleansed, making sure to leave some of the prepping solution back to take advantage of its  long term bactericidal properties.  Vitals:   08/19/21 1020 08/19/21 1025 08/19/21 1030 08/19/21 1032  BP: (!) 148/86 (!) 145/83 (!) 143/85 136/81  Pulse:      Resp: 14 14 15 16   Temp:      TempSrc:      SpO2: 99% 99% 99% 99%  Weight:      Height:        Start Time: 1018 hrs. [name] Time: 1032 hrs. Materials:  Needle(s) Type: Spinal Needle Gauge: 22G Length: 3.5-in Medication(s): Please see orders for medications and dosing details. 5 cc solution made of 4 cc of 0.2% ropivacaine, 1 cc of Decadron 10 mg/cc. Injected along the left suprascapular nerve after contrast confirmation   Imaging Guidance (Non-Spinal):          Type of Imaging Technique: Fluoroscopy Guidance (Non-Spinal) Indication(s): Assistance in needle guidance and placement for procedures requiring needle placement in or near specific anatomical locations not easily accessible without such assistance. Exposure Time: Please see nurses notes. Contrast: Before injecting any contrast, we confirmed that the patient did not have an allergy to iodine, shellfish, or radiological contrast. Once satisfactory needle placement was completed at the desired level, radiological contrast was injected. Contrast injected under live fluoroscopy. No contrast complications. See chart for type and volume of contrast used. Fluoroscopic Guidance: I was personally present during the use of fluoroscopy. "Tunnel Vision Technique" used to obtain the best possible view of the target area. Parallax error  corrected before commencing the procedure. "Direction-depth-direction" technique used to introduce the needle under continuous pulsed fluoroscopy. Once target was reached, antero-posterior, oblique, and lateral fluoroscopic projection used confirm needle placement in all planes. Images permanently stored in EMR. Interpretation: I personally interpreted the imaging intraoperatively. Adequate needle placement confirmed in multiple planes. Appropriate spread of contrast into desired area was observed. No evidence of afferent or efferent intravascular uptake. Permanent images saved into the patient's record.   Post-operative Assessment:  Post-procedure Vital Signs:  Pulse/HCG Rate: (!) 10292 Temp: (!) 97.3 F (36.3 C) Resp: 16 BP: 136/81 SpO2: 99 %  EBL: None  Complications: No immediate post-treatment complications observed by team, or reported by patient.  Note: The patient tolerated the entire procedure well. A repeat set of vitals were taken after the procedure and the patient was kept under observation following institutional policy, for this type of procedure. Post-procedural neurological assessment was performed, showing return to baseline, prior to discharge. The patient was provided with post-procedure discharge instructions, including a section on how to identify potential problems. Should any problems arise concerning this procedure, the patient was given instructions to immediately contact us, at any time, without hesitation. In any case, we plan to contact the patient by telephone for a follow-up status report regarding this interventional procedure.  Comments:  No additional relevant information.  Plan of Care  Orders:  Orders Placed This Encounter  Procedures   [facility] C-ARM 1-60 MIN NO REPORT    Intraoperative interpretation by procedural physician at [facility].    Standing Status:   Standing    Number of Occurrences:   1    Order Specific Question:    Reason for exam:    Answer:   Assistance in needle guidance and placement for procedures requiring needle placement in or near specific anatomical locations not easily accessible without such assistance.    Medications ordered for procedure: Meds ordered this encounter  Medications   iohexol (OMNIPAQUE) 180 MG/ML injection 10 mL  Must be Myelogram-compatible. If not available, you may substitute with a water-soluble, non-ionic, hypoallergenic, myelogram-compatible radiological contrast medium.   lidocaine (XYLOCAINE) 2 % (with pres) injection 400 mg   dexamethasone (DECADRON) injection 10 mg   dexamethasone (DECADRON) injection 10 mg   dexamethasone (DECADRON) injection 10 mg   ropivacaine (PF) 2 mg/mL (0.2%) (NAROPIN) injection 9 mL   ropivacaine (PF) 2 mg/mL (0.2%) (NAROPIN) injection 9 mL   Medications administered: We administered iohexol, lidocaine, dexamethasone, dexamethasone, dexamethasone, ropivacaine (PF) 2 mg/mL (0.2%), and ropivacaine (PF) 2 mg/mL (0.2%).  See the medical record for exact dosing, route, and time of administration.  Follow-up plan:   Return in about 5 weeks (around 09/23/2021) for Post Procedure Evaluation, virtual.      Recent Visits Date Type Provider Dept  08/05/21 Office Visit Gillis Santa, MD [facility]  07/23/21 Procedure visit Gillis Santa, MD [facility]  07/17/21 Procedure visit Gillis Santa, MD [facility]  07/15/21 Office Visit Gillis Santa, MD [facility]  07/01/21 Procedure visit Gillis Santa, MD [facility]  06/25/21 Office Visit Gillis Santa, MD [facility]  Showing recent visits within past 90 days and meeting all other requirements Today's Visits Date Type Provider Dept  08/19/21 Procedure visit Gillis Santa, MD [facility]  Showing today's visits and meeting all other requirements Future Appointments Date Type Provider Dept  09/23/21 Appointment Gillis Santa, MD [facility]  Showing future appointments within next 90 days and meeting all other requirements Disposition: Discharge home  Discharge (Date  Time): 08/19/2021; 1040 hrs.   Primary Care Physician: Tracie Harrier, MD Location: [facility] Note by: Gillis Santa, MD Date: 08/19/2021; Time: 10:43 AM  Disclaimer:  Medicine is not an exact science. The only guarantee in medicine is that nothing is guaranteed. It is important to note that the decision to proceed with this intervention was based on the information collected from the patient. The Data and conclusions were drawn from the patient's questionnaire, the interview, and the physical examination. Because the information was provided in large part by the patient, it cannot be guaranteed that it has not been purposely or unconsciously manipulated. Every effort has been made to obtain as much relevant data as possible for this evaluation. It is important to note that the conclusions that lead to this procedure are derived in large part from the available data. Always take into account that the treatment will also be dependent on availability of resources and existing treatment guidelines, considered by other Pain Management Practitioners as being common knowledge and practice, at the time of the intervention. For Medico-Legal purposes, it is also important to point out that variation in procedural techniques and pharmacological choices are the acceptable norm. The indications, contraindications, technique, and results of the above procedure should only be interpreted and judged by a Board-Certified Interventional Pain Specialist with extensive familiarity and expertise in the same exact procedure and technique.

## 2021-08-20 ENCOUNTER — Telehealth: Payer: Self-pay | Admitting: *Deleted

## 2021-08-20 NOTE — Telephone Encounter (Signed)
Denies any post procedure issues. 

## 2021-09-23 ENCOUNTER — Ambulatory Visit
Payer: Medicare Other | Attending: Student in an Organized Health Care Education/Training Program | Admitting: Student in an Organized Health Care Education/Training Program

## 2021-09-23 ENCOUNTER — Encounter: Payer: Self-pay | Admitting: Student in an Organized Health Care Education/Training Program

## 2021-09-23 ENCOUNTER — Other Ambulatory Visit: Payer: Self-pay

## 2021-09-23 DIAGNOSIS — M25512 Pain in left shoulder: Secondary | ICD-10-CM

## 2021-09-23 DIAGNOSIS — M47816 Spondylosis without myelopathy or radiculopathy, lumbar region: Secondary | ICD-10-CM

## 2021-09-23 DIAGNOSIS — G8929 Other chronic pain: Secondary | ICD-10-CM

## 2021-09-23 DIAGNOSIS — M67912 Unspecified disorder of synovium and tendon, left shoulder: Secondary | ICD-10-CM

## 2021-09-23 DIAGNOSIS — G894 Chronic pain syndrome: Secondary | ICD-10-CM | POA: Diagnosis not present

## 2021-09-23 NOTE — Progress Notes (Signed)
Patient: Teresa [name]  Service Category: E/M  Provider: Gillis Santa, MD  DOB: 1956-10-23  DOS: 09/23/2021  Location: Office  MRN: [id number]  Setting: Ambulatory outpatient  Referring Provider: Tracie Harrier, MD  Type: Established Patient  Specialty: Interventional Pain Management  PCP: Tracie Harrier, MD  Location: Remote location  Delivery: TeleHealth     Virtual Encounter - Pain Management PROVIDER NOTE: Information contained herein reflects review and annotations entered in association with encounter. Interpretation of such information and data should be left to medically-trained personnel. Information provided to patient can be located elsewhere in the medical record under "Patient Instructions". Document created using STT-dictation technology, any transcriptional errors that may result from process are unintentional.    Contact & Pharmacy Preferred: [place]: [phone] (home) Mobile: [phone] (mobile) E-mail: [email or web address]  [id number] [place] - [address] [place] [place]: [phone]: [phone] [id number] [address]Phone: [phone]: [phone] [id number] [place] [address] [place] [place]hone: [phone]: [phone] Pre-screening  Ms. AsKovacicffered "in-person" vs "virtual" encounter. She indicated preferring virtual for this encounter.   Reason COVID-19*  Social distancing based on CDC and AMA recommendations.   I contacted DeEloy Endn 09/23/2021 via telephone.      I clearly identified myself as BiGillis SantaMD. I verified that I was speaking with the correct person using two identifiers (Name: DeATHENE SCHUHMACHERand date of birth: [birth year]  Consent I sought verbal advanced consent from [name]  virtual visit interactions. I informed Ms. AsBraddyf possible security and privacy concerns, risks, and limitations associated with providing "not-in-person" medical evaluation and management services. I also informed Ms. AsVaheyf the availability of "in-person" appointments. Finally, I informed her that there would be a charge for the virtual visit and that she could be  personally, fully or partially, financially responsible for it. Ms. AsCumboxpressed understanding and agreed to proceed.   Historic Elements   Ms. DeKAISEY HUSEBYs a [age].o. year old, female patient evaluated today after our last contact on 08/19/2021. Ms. AsJemmotthas a past medical history of Dyspnea, Headache, History of fractured vertebra (1982), Hyperlipidemia, Low bone density, Melanoma (HCGregory and Transfusion of blood product refused for religious reason. She also  has a past surgical history that includes Abdominal hysterectomy; Colonoscopy (11/03/1998); Eye surgery (Bilateral, 11/03/2012); Artery Biopsy (Left, 12/18/2015); and melanoma excisison (Right). Ms. AsWalthouras a current medication list which includes the following prescription(s): acetaminophen, alendronate, vitamin c, aspirin-acetaminophen-caffeine, calcium carb-cholecalciferol, vitamin d3, [facility]vs vitamin b12, dicyclomine, ferrous gluconate, gabapentin, inulin, magnesium, multivitamin with minerals, nortriptyline, oxycodone-acetaminophen, pantoprazole, tizanidine, topiramate, zolpidem, amoxicillin-clavulanate, ibuprofen, and methylprednisolone. She  reports that she has never smoked. She has never used smokeless tobacco. She reports that she does not drink alcohol and does not use drugs. Ms. AsBertzs allergic to elastic bandages & [zinc] and morphine.   HPI  Today, she is being contacted for a post-procedure assessment.  -Low back pain is overall better (see details below) other than a small trigger point in her lumbar spine -Now having increased shoulder pain of the  left, requesting repeat shoulder joint injection as well as TPI of that region -S/p left glenohumeral steroid injection 01/02/2020 which was helpful.  Post-Procedure  Evaluation  Procedure (08/19/2021):   Type: Lumbar Facet, Medial Branch Block(s)  Primary Purpose: Therapeutic Region: Posterolateral Lumbosacral Spine Level: L3, L4, L5, Medial Branch Level(s). Injecting these levels blocks the L3-4, L4-5, lumbar facet joints. Laterality: Bilateral  Anxiolysis: Please see nurses note.  Effectiveness during initial hour after procedure (Ultra-Short Term Relief): 100 %   Local anesthetic used: Long-acting (4-6 hours) Effectiveness: Defined as any analgesic benefit obtained secondary to the administration of local anesthetics. This carries significant diagnostic value as to the etiological location, or anatomical origin, of the pain. Duration of benefit is expected to coincide with the duration of the local anesthetic used.  Effectiveness during initial 4-6 hours after procedure (Short-Term Relief): 100 %   Long-term benefit: Defined as any relief past the pharmacologic duration of the local anesthetics.  Effectiveness past the initial 6 hours after procedure (Long-Term Relief): 100 % (patient experienced good relief while the area was numb and this lasted approx 1 day.)   Benefits, current: Defined as benefit present at the time of this evaluation.   Analgesia:  80-90% Function: yes      Laboratory Chemistry Profile   Renal Lab Results  Component Value Date   BUN 11 08/14/2014   CREATININE 1.00 06/17/2021   GFRAA >90 08/14/2014   GFRNONAA 89 (L) 08/14/2014    Hepatic No results found for: AST, ALT, ALBUMIN, ALKPHOS, HCVAB, AMYLASE, LIPASE, AMMONIA  Electrolytes Lab Results  Component Value Date   NA 139 08/14/2014   K 4.0 08/14/2014   CL 99 08/14/2014   CALCIUM 10.2 08/14/2014    Bone No results found for: VD25OH, VD125OH2TOT, SN0539JQ7, HA1937TK2, 25OHVITD1, 25OHVITD2,  25OHVITD3, TESTOFREE, TESTOSTERONE  Inflammation (CRP: Acute Phase) (ESR: Chronic Phase) No results found for: CRP, ESRSEDRATE, LATICACIDVEN       Note: Above Lab results reviewed.   Assessment  The primary encounter diagnosis was Chronic left shoulder pain. Diagnoses of Dysfunction of left rotator cuff, Lumbar spondylosis, Lumbar facet arthropathy (L3/4 and L4/5), and Chronic pain syndrome were also pertinent to this visit.  Plan of Care    Ms. RESHUNDA STRIDER has a current medication list which includes the following long-term medication(s): calcium carb-cholecalciferol, dicyclomine, ferrous gluconate, pantoprazole, topiramate, and zolpidem.  1. Chronic left shoulder pain - SHOULDER INJECTION; Future - TRIGGER POINT INJECTION; Future  2. Dysfunction of left rotator cuff - SHOULDER INJECTION; Future - TRIGGER POINT INJECTION; Future  3. Lumbar spondylosis -Good benefit after previous medial branch nerve blocks, continue to monitor, repeat as needed.  4. Lumbar facet arthropathy (L3/4 and L4/5) --Good benefit after previous medial branch nerve blocks, continue to monitor, repeat as needed.  5. Chronic pain syndrome - SHOULDER INJECTION; Future - TRIGGER POINT INJECTION; Future   Orders:  Orders Placed This Encounter  Procedures   SHOULDER INJECTION    Standing Status:   Future    Standing Expiration Date:   11/23/2021    Scheduling Instructions:     Procedure: Intra-articular shoulder (Glenohumeral) joint injection     Side: LEFT     Level: Glenohumeral joint               Sedation: without     Timeframe: As permitted by the schedule    Order Specific Question:   Where will this procedure be performed?    Answer:   [facility]   TRIGGER POINT INJECTION    Standing Status:   Future    Standing Expiration Date:   12/24/2021  Scheduling Instructions:     Left shoulder, trapezius    Order Specific Question:   Where will this procedure be performed?     Answer:   [facility]    Follow-up plan:   Return in about 9 days (around 10/02/2021) for Left shoulder + TPI , without sedation.     Status post left L3, L4, L5, S1 RFA on 06/15/2019, R L3,4,5, S1 RFA on 06/29/2019, left posterior glenohumeral shoulder steroid injection 01/02/2020: Not effective.  Left L4 and Sprint peripheral nerve stimulation on 04/25/2020, removed 06/11/2020.  Left suprascapular nerve block 07/01/2021              Recent Visits Date Type Provider Dept  08/19/21 Procedure visit Gillis Santa, MD [facility]  08/05/21 Office Visit Gillis Santa, MD [facility]  07/23/21 Procedure visit Gillis Santa, MD [facility]  07/17/21 Procedure visit Gillis Santa, MD [facility]  07/15/21 Office Visit Gillis Santa, MD [facility]  07/01/21 Procedure visit Gillis Santa, MD [facility]  06/25/21 Office Visit Gillis Santa, MD [facility]  Showing recent visits within past 90 days and meeting all other requirements Today's Visits Date Type Provider Dept  09/23/21 Office Visit Gillis Santa, MD [facility]  Showing today's visits and meeting all other requirements Future Appointments No visits were found meeting these conditions. Showing future appointments within next 90 days and meeting all other requirements I discussed the assessment and treatment plan with the patient. The patient was provided an opportunity to ask questions and all were answered. The patient agreed with the plan and demonstrated an understanding of the instructions.  Patient advised to call back or seek an in-person evaluation if the symptoms or condition worsens.  Duration of encounter: 18mnutes.  Note by: BGillis Santa MD Date: 09/23/2021; Time: 3:20 PM

## 2021-10-02 ENCOUNTER — Ambulatory Visit
Admission: RE | Admit: 2021-10-02 | Discharge: 2021-10-02 | Disposition: A | Discharge status: Home / Self Care | Payer: Medicare Other | Source: Ambulatory Visit | Attending: Student in an Organized Health Care Education/Training Program | Admitting: Student in an Organized Health Care Education/Training Program

## 2021-10-02 ENCOUNTER — Ambulatory Visit (HOSPITAL_BASED_OUTPATIENT_CLINIC_OR_DEPARTMENT_OTHER): Payer: Medicare Other | Admitting: Student in an Organized Health Care Education/Training Program

## 2021-10-02 ENCOUNTER — Encounter: Payer: Self-pay | Admitting: Student in an Organized Health Care Education/Training Program

## 2021-10-02 ENCOUNTER — Other Ambulatory Visit: Payer: Self-pay

## 2021-10-02 VITALS — BP 114/73 | HR 107 | Temp 96.6°F | Resp 13 | Ht 66.0 in | Wt 135.0 lb

## 2021-10-02 DIAGNOSIS — M19012 Primary osteoarthritis, left shoulder: Secondary | ICD-10-CM | POA: Insufficient documentation

## 2021-10-02 DIAGNOSIS — M67912 Unspecified disorder of synovium and tendon, left shoulder: Secondary | ICD-10-CM | POA: Diagnosis present

## 2021-10-02 DIAGNOSIS — G8929 Other chronic pain: Secondary | ICD-10-CM | POA: Diagnosis present

## 2021-10-02 DIAGNOSIS — M7918 Myalgia, other site: Secondary | ICD-10-CM | POA: Insufficient documentation

## 2021-10-02 DIAGNOSIS — M25512 Pain in left shoulder: Secondary | ICD-10-CM | POA: Diagnosis present

## 2021-10-02 DIAGNOSIS — G894 Chronic pain syndrome: Secondary | ICD-10-CM

## 2021-10-02 MED ORDER — ROPIVACAINE HCL 2 MG/ML IJ SOLN
INTRAMUSCULAR | Status: AC
  Filled 2021-10-02: qty 20

## 2021-10-02 MED ORDER — ROPIVACAINE HCL 2 MG/ML IJ SOLN
9.0000 mL | Freq: Once | INTRAMUSCULAR | Status: AC
  Administered 2021-10-02: 20 mL via INTRA_ARTICULAR

## 2021-10-02 MED ORDER — IOHEXOL 180 MG/ML  SOLN
INTRAMUSCULAR | Status: AC
  Filled 2021-10-02: qty 20

## 2021-10-02 MED ORDER — HYDROCODONE-ACETAMINOPHEN 7.5-325 MG PO TABS
1.0000 | ORAL_TABLET | Freq: Every day | ORAL | 0 refills | Status: AC | PRN
Start: 2021-10-02 — End: 2021-11-01

## 2021-10-02 MED ORDER — METHYLPREDNISOLONE ACETATE 80 MG/ML IJ SUSP
INTRAMUSCULAR | Status: AC
  Filled 2021-10-02: qty 1

## 2021-10-02 MED ORDER — IOHEXOL 180 MG/ML  SOLN
10.0000 mL | Freq: Once | INTRAMUSCULAR | Status: AC
  Administered 2021-10-02: 5 mL via INTRA_ARTICULAR

## 2021-10-02 MED ORDER — LIDOCAINE HCL 2 % IJ SOLN
INTRAMUSCULAR | Status: AC
  Filled 2021-10-02: qty 20

## 2021-10-02 MED ORDER — METHYLPREDNISOLONE ACETATE 40 MG/ML IJ SUSP
40.0000 mg | Freq: Once | INTRAMUSCULAR | Status: AC
  Administered 2021-10-02: 40 mg via INTRA_ARTICULAR
  Filled 2021-10-02: qty 1

## 2021-10-02 MED ORDER — LIDOCAINE HCL 2 % IJ SOLN
20.0000 mL | Freq: Once | INTRAMUSCULAR | Status: AC
  Administered 2021-10-02: 400 mg

## 2021-10-02 MED ORDER — ROPIVACAINE HCL 2 MG/ML IJ SOLN
9.0000 mL | Freq: Once | INTRAMUSCULAR | Status: AC
  Administered 2021-10-02: 20 mL via PERINEURAL

## 2021-10-02 NOTE — Patient Instructions (Signed)

## 2021-10-02 NOTE — Progress Notes (Signed)
PROVIDER NOTE: Information contained herein reflects review and annotations entered in association with encounter. Interpretation of such information and data should be left to medically-trained personnel. Information provided to patient can be located elsewhere in the medical record under "Patient Instructions". Document created using STT-dictation technology, any transcriptional errors that may result from process are unintentional.    Patient: Teresa [name]  Service Category: Procedure  Provider: Gillis Santa, MD  DOB: [birth year]  DOS: 10/02/2021  Location: [facility]  MRN: [id number]  Setting: Ambulatory - outpatient  Referring Provider: Gillis Santa, MD  Type: Established Patient  Specialty: Interventional Pain Management  PCP: Tracie Harrier, MD   Primary Reason for Visit: Interventional Pain Management Treatment. CC: Shoulder Pain (left) and Back Pain (lower)  Procedure:          Anesthesia, Analgesia, Anxiolysis:  Type: Therapeutic Glenohumeral Joint (shoulder) Injection #2 along with periscapular and thoracic and sacral trigger point injections for myofascial pain syndrome Primary Purpose: Diagnostic/Therapeutric Region: Superior Shoulder Area Level:  Shoulder Target Area: Glenohumeral Joint (shoulder) Approach: Posterior approach. Laterality: Left-Sided  Type: Local Anesthesia  Local Anesthetic: Lidocaine 1-2%  Position: Prone   Indications: 1. Chronic left shoulder pain   2. Myofascial pain syndrome   3. Dysfunction of left rotator cuff   4. Chronic pain syndrome    Pain Score: Pre-procedure: 3 /10 Post-procedure: 2 /10   Pre-op Assessment:  Teresa [name] is a 65 y.o. (year old), female patient, seen today for interventional treatment. She  has a past surgical history that includes Abdominal hysterectomy; Colonoscopy (11/03/1998); Eye surgery (Bilateral, 11/03/2012); Artery Biopsy (Left, 12/18/2015); and melanoma excisison (Right). Teresa [name] has a  current medication list which includes the following prescription(s): acetaminophen, alendronate, vitamin c, aspirin-acetaminophen-caffeine, calcium carb-cholecalciferol, vitamin d3, cvs vitamin b12, dicyclomine, ferrous gluconate, gabapentin, inulin, magnesium, multivitamin with minerals, nortriptyline, oxycodone-acetaminophen, pantoprazole, tizanidine, topiramate, zolpidem, amoxicillin-clavulanate, hydrocodone-acetaminophen, ibuprofen, and methylprednisolone. Her primarily concern today is the Shoulder Pain (left) and Back Pain (lower)  Initial Vital Signs:  Pulse/HCG Rate: (!) 107ECG Heart Rate: 98 Temp: (!) 96.6 F (35.9 C) Resp: 15 BP: 125/63 SpO2: 99 %  BMI: Estimated body mass index is 21.79 kg/m as calculated from the following:   Height as of this encounter: 5\' 6"  (1.676 m).   Weight as of this encounter: 135 lb (61.2 kg).  Risk Assessment: Allergies: Reviewed. She is allergic to elastic bandages & [zinc] and morphine.  Allergy Precautions: None required Coagulopathies: Reviewed. None identified.  Blood-thinner therapy: None at this time Active Infection(s): Reviewed. None identified. Teresa [name] is afebrile  Site Confirmation: Teresa [name] was asked to confirm the procedure and laterality before marking the site Procedure checklist: Completed Consent: Before the procedure and under the influence of no sedative(s), amnesic(s), or anxiolytics, the patient was informed of the treatment options, risks and possible complications. To fulfill our ethical and legal obligations, as recommended by the American Medical Association's Code of Ethics, I have informed the patient of my clinical impression; the nature and purpose of the treatment or procedure; the risks, benefits, and possible complications of the intervention; the alternatives, including doing nothing; the risk(s) and benefit(s) of the alternative treatment(s) or procedure(s); and the risk(s) and benefit(s) of doing nothing. The  patient was provided information about the general risks and possible complications associated with the procedure. These may include, but are not limited to: failure to achieve desired goals, infection, bleeding, organ or nerve damage, allergic reactions, paralysis, and death. In addition, the patient was  informed of those risks and complications associated to the procedure, such as failure to decrease pain; infection; bleeding; organ or nerve damage with subsequent damage to sensory, motor, and/or autonomic systems, resulting in permanent pain, numbness, and/or weakness of one or several areas of the body; allergic reactions; (i.e.: anaphylactic reaction); and/or death. Furthermore, the patient was informed of those risks and complications associated with the medications. These include, but are not limited to: allergic reactions (i.e.: anaphylactic or anaphylactoid reaction(s)); adrenal axis suppression; blood sugar elevation that in diabetics may result in ketoacidosis or comma; water retention that in patients with history of congestive heart failure may result in shortness of breath, pulmonary edema, and decompensation with resultant heart failure; weight gain; swelling or edema; medication-induced neural toxicity; particulate matter embolism and blood vessel occlusion with resultant organ, and/or nervous system infarction; and/or aseptic necrosis of one or more joints. Finally, the patient was informed that Medicine is not an exact science; therefore, there is also the possibility of unforeseen or unpredictable risks and/or possible complications that may result in a catastrophic outcome. The patient indicated having understood very clearly. We have given the patient no guarantees and we have made no promises. Enough time was given to the patient to ask questions, all of which were answered to the patient's satisfaction. Teresa [name] has indicated that she wanted to continue with the procedure. Attestation:  I, the ordering provider, attest that I have discussed with the patient the benefits, risks, side-effects, alternatives, likelihood of achieving goals, and potential problems during recovery for the procedure that I have provided informed consent. Date  Time: 10/02/2021  7:52 AM  Pre-Procedure Preparation:  Monitoring: As per clinic protocol. Respiration, ETCO2, SpO2, BP, heart rate and rhythm monitor placed and checked for adequate function Safety Precautions: Patient was assessed for positional comfort and pressure points before starting the procedure. Time-out: I initiated and conducted the "Time-out" before starting the procedure, as per protocol. The patient was asked to participate by confirming the accuracy of the "Time Out" information. Verification of the correct person, site, and procedure were performed and confirmed by me, the nursing staff, and the patient. "Time-out" conducted as per Joint Commission's Universal Protocol (UP.01.01.01). Time: 0834  Description of Procedure:          Area Prepped: Entire shoulder Area Prepping solution: DuraPrep (Iodine Povacrylex [0.7% available iodine] and Isopropyl Alcohol, 74% w/w) Safety Precautions: Aspiration looking for blood return was conducted prior to all injections. At no point did we inject any substances, as a needle was being advanced. No attempts were made at seeking any paresthesias. Safe injection practices and needle disposal techniques used. Medications properly checked for expiration dates. SDV (single dose vial) medications used. Description of the Procedure: Protocol guidelines were followed. The patient was placed in position over the procedure table. The target area was identified and the area prepped in the usual manner. Skin & deeper tissues infiltrated with local anesthetic. Appropriate amount of time allowed to pass for local anesthetics to take effect. The procedure needles were then advanced to the target area. Proper needle  placement secured. Negative aspiration confirmed. Solution injected in intermittent fashion, asking for systemic symptoms every 0.5cc of injectate. The needles were then removed and the area cleansed, making sure to leave some of the prepping solution back to take advantage of its long term bactericidal properties.    Vitals:   10/02/21 0758 10/02/21 0836 10/02/21 0841 10/02/21 0843  BP: 125/63 125/64  114/73  Pulse: (!) 107  Resp: 15 14 15 13   Temp: (!) 96.6 F (35.9 C)     TempSrc: Temporal     SpO2: 99% 95% 97% 100%  Weight: 135 lb (61.2 kg)     Height: 5\' 6"  (1.676 m)       Start Time: 0834 hrs. [name] Time: 0838 hrs. Materials:  Needle(s) Type: Spinal Needle Gauge: 22G Length: 3.5-in Medication(s): Please see orders for medications and dosing details. 5 cc solution made of 4 cc of 0.2% ropivacaine, 1 cc of methylprednisolone, 40 mg/cc was injected into the left glenohumeral joint Afterwards trigger point injections were done in the trapezius, periscapular, rhomboids, lumbar paraspinal region.  Dry needling was performed and 0.5 to 1 cc of 0.2% ropivacaine was injected with each trigger point. No complications. Imaging Guidance (Non-Spinal):          Type of Imaging Technique: Fluoroscopy Guidance (Non-Spinal) Indication(s): Assistance in needle guidance and placement for procedures requiring needle placement in or near specific anatomical locations not easily accessible without such assistance. Exposure Time: Please see nurses notes. Contrast: Before injecting any contrast, we confirmed that the patient did not have an allergy to iodine, shellfish, or radiological contrast. Once satisfactory needle placement was completed at the desired level, radiological contrast was injected. Contrast injected under live fluoroscopy. No contrast complications. See chart for type and volume of contrast used. Fluoroscopic Guidance: I was personally present during the use of fluoroscopy. "Tunnel  Vision Technique" used to obtain the best possible view of the target area. Parallax error corrected before commencing the procedure. "Direction-depth-direction" technique used to introduce the needle under continuous pulsed fluoroscopy. Once target was reached, antero-posterior, oblique, and lateral fluoroscopic projection used confirm needle placement in all planes. Images permanently stored in EMR. Interpretation: I personally interpreted the imaging intraoperatively. Adequate needle placement confirmed in multiple planes. Appropriate spread of contrast into desired area was observed. No evidence of afferent or efferent intravascular uptake. Permanent images saved into the patient's record.  Post-operative Assessment:  Post-procedure Vital Signs:  Pulse/HCG Rate: (!) 10796 Temp: (!) 96.6 F (35.9 C) Resp: 13 BP: 114/73 SpO2: 100 %  EBL: None  Complications: No immediate post-treatment complications observed by team, or reported by patient.  Note: The patient tolerated the entire procedure well. A repeat set of vitals were taken after the procedure and the patient was kept under observation following institutional policy, for this type of procedure. Post-procedural neurological assessment was performed, showing return to baseline, prior to discharge. The patient was provided with post-procedure discharge instructions, including a section on how to identify potential problems. Should any problems arise concerning this procedure, the patient was given instructions to immediately contact us, at any time, without hesitation. In any case, we plan to contact the patient by telephone for a follow-up status report regarding this interventional procedure.  Comments:  No additional relevant information.  Plan of Care   Orders:  Orders Placed This Encounter  Procedures   DG PAIN [facility] C-ARM 1-60 MIN NO REPORT    Intraoperative interpretation by procedural physician at [facility].     Standing Status:   Standing    Number of Occurrences:   1    Order Specific Question:   Reason for exam:    Answer:   Assistance in needle guidance and placement for procedures requiring needle placement in or near specific anatomical locations not easily accessible without such assistance.    Patient requesting refill of her hydrocodone.  Takes very seldomly, half tablet to  1 tablet every 3 to 4 days when she is having increased pain that is making it hard for her to sleep.  PMP checked and appropriate.  Medications ordered for procedure: Meds ordered this encounter  Medications   HYDROcodone-acetaminophen (NORCO) 7.5-325 MG tablet    Sig: Take 1 tablet by mouth daily as needed for moderate pain.    Dispense:  30 tablet    Refill:  0   lidocaine (XYLOCAINE) 2 % (with pres) injection 400 mg   iohexol (OMNIPAQUE) 180 MG/ML injection 10 mL    Must be Myelogram-compatible. If not available, you may substitute with a water-soluble, non-ionic, hypoallergenic, myelogram-compatible radiological contrast medium.   ropivacaine (PF) 2 mg/mL (0.2%) (NAROPIN) injection 9 mL   methylPREDNISolone acetate (DEPO-MEDROL) injection 40 mg   ropivacaine (PF) 2 mg/mL (0.2%) (NAROPIN) injection 9 mL   Medications administered: We administered lidocaine, iohexol, ropivacaine (PF) 2 mg/mL (0.2%), methylPREDNISolone acetate, and ropivacaine (PF) 2 mg/mL (0.2%).  See the medical record for exact dosing, route, and time of administration.  Follow-up plan:   Return in about 3 weeks (around 10/23/2021) for Post Procedure Evaluation, virtual.     Recent Visits Date Type Provider Dept  09/23/21 Office Visit Gillis Santa, MD [facility]  08/19/21 Procedure visit Gillis Santa, MD [facility]  08/05/21 Office Visit Gillis Santa, MD [facility]  07/23/21 Procedure visit Gillis Santa, MD [facility]  07/17/21 Procedure visit Gillis Santa, MD [facility]   07/15/21 Office Visit Gillis Santa, MD [facility]  Showing recent visits within past 90 days and meeting all other requirements Today's Visits Date Type Provider Dept  10/02/21 Procedure visit Gillis Santa, MD [facility]  Showing today's visits and meeting all other requirements Future Appointments Date Type Provider Dept  10/21/21 Appointment Gillis Santa, MD [facility]  Showing future appointments within next 90 days and meeting all other requirements  Disposition: Discharge home  Discharge (Date  Time): 10/02/2021; 0850 hrs.   Primary Care Physician: Tracie Harrier, MD Location: [facility] Note by: Gillis Santa, MD Date: 10/02/2021; Time: 10:56 AM  Disclaimer:  Medicine is not an exact science. The only guarantee in medicine is that nothing is guaranteed. It is important to note that the decision to proceed with this intervention was based on the information collected from the patient. The Data and conclusions were drawn from the patient's questionnaire, the interview, and the physical examination. Because the information was provided in large part by the patient, it cannot be guaranteed that it has not been purposely or unconsciously manipulated. Every effort has been made to obtain as much relevant data as possible for this evaluation. It is important to note that the conclusions that lead to this procedure are derived in large part from the available data. Always take into account that the treatment will also be dependent on availability of resources and existing treatment guidelines, considered by other Pain Management Practitioners as being common knowledge and practice, at the time of the intervention. For Medico-Legal purposes, it is also important to point out that variation in procedural techniques and pharmacological choices are the acceptable norm. The indications, contraindications, technique, and results of  the above procedure should only be interpreted and judged by a Board-Certified Interventional Pain Specialist with extensive familiarity and expertise in the same exact procedure and technique.

## 2021-10-02 NOTE — Progress Notes (Signed)
Nursing Pain Medication Assessment:  Safety precautions to be maintained throughout the outpatient stay will include: orient to surroundings, keep bed in low position, maintain call bell within reach at all times, provide assistance with transfer out of bed and ambulation.  Medication Inspection Compliance: Pill count conducted under aseptic conditions, in front of the patient. Neither the pills nor the bottle was removed from the patient's sight at any time. Once count was completed pills were immediately returned to the patient in their original bottle.  Medication #1: Oxycodone/APAP Pill/Patch Count:  06 of 30 pills remain Pill/Patch Appearance: Markings consistent with prescribed medication Bottle Appearance: Standard pharmacy container. Clearly labeled. Filled Date: 08 / 29 / 2022 Last Medication intake:  month ago  Medication #2: Hydrocodone/APAP Pill/Patch Count:  04 of 30 pills remain Pill/Patch Appearance: Markings consistent with prescribed medication Bottle Appearance: Standard pharmacy container. Clearly labeled. Filled Date: 03 / 24 /  2020 Last Medication intake:  Day before yesterday

## 2021-10-03 ENCOUNTER — Telehealth: Payer: Self-pay | Admitting: *Deleted

## 2021-10-03 NOTE — Telephone Encounter (Signed)
No problems post procedure. 

## 2021-10-21 ENCOUNTER — Other Ambulatory Visit: Payer: Self-pay

## 2021-10-21 ENCOUNTER — Encounter: Payer: Self-pay | Admitting: Student in an Organized Health Care Education/Training Program

## 2021-10-21 ENCOUNTER — Ambulatory Visit
Payer: Medicare Other | Attending: Student in an Organized Health Care Education/Training Program | Admitting: Student in an Organized Health Care Education/Training Program

## 2021-10-21 DIAGNOSIS — M7918 Myalgia, other site: Secondary | ICD-10-CM | POA: Diagnosis not present

## 2021-10-21 DIAGNOSIS — M47816 Spondylosis without myelopathy or radiculopathy, lumbar region: Secondary | ICD-10-CM

## 2021-10-21 DIAGNOSIS — M25512 Pain in left shoulder: Secondary | ICD-10-CM

## 2021-10-21 DIAGNOSIS — G8929 Other chronic pain: Secondary | ICD-10-CM

## 2021-10-21 DIAGNOSIS — M67912 Unspecified disorder of synovium and tendon, left shoulder: Secondary | ICD-10-CM | POA: Diagnosis not present

## 2021-10-21 DIAGNOSIS — G894 Chronic pain syndrome: Secondary | ICD-10-CM

## 2021-10-21 NOTE — Progress Notes (Signed)
Patient: Teresa [name]  Service Category: E/M  Provider: Gillis Santa, MD  DOB: 12/01/1955  DOS: 10/21/2021  Location: Office  MRN: [id number]  Setting: Ambulatory outpatient  Referring Provider: Tracie Harrier, MD  Type: Established Patient  Specialty: Interventional Pain Management  PCP: Teresa Harrier, MD  Location: Remote location  Delivery: TeleHealth     Virtual Encounter - Pain Management PROVIDER NOTE: Information contained herein reflects review and annotations entered in association with encounter. Interpretation of such information and data should be left to medically-trained personnel. Information provided to patient can be located elsewhere in the medical record under "Patient Instructions". Document created using STT-dictation technology, any transcriptional errors that may result from process are unintentional.    Contact & Pharmacy Preferred: [place]: [phone] (home) Mobile: [phone] (mobile) E-mail: [email or web address]  [id number] [place] - [address] [place]: [phone]: [phone] [id number] [place], [place] [address] [place]: [phone]: [phone] Pre-screening  [name] "in-person" vs "virtual" encounter. She indicated preferring virtual for this encounter.   Reason COVID-19*   Social distancing based on CDC and AMA recommendations.   I contacted Teresa [name] 10/21/2021 via telephone.      I clearly identified myself as BiGillis SantaMD. I verified that I was speaking with the correct person using two identifiers (Name: Teresa HADDOXand date of birth: 7/30-Aug-1956  Consent I sought verbal advanced consent from Teresa [name] virtual visit interactions. I informed [name] possible security and privacy concerns, risks, and limitations associated with providing  "not-in-person" medical evaluation and management services. I also informed [name] the availability of "in-person" appointments. Finally, I informed her that there would be a charge for the virtual visit and that she could be  personally, fully or partially, financially responsible for it. Ms. AsLobelloxpressed understanding and agreed to proceed.   Historic Elements   [name] a [age].o. year old, female patient evaluated today after our last contact on 10/02/2021. [name] a past medical history of Dyspnea, Headache, History of fractured vertebra (1982), Hyperlipidemia, Low bone density, [place]an[place] ([place] and Transfusion of blood product refused for religious reason. She also  has a past surgical history that includes Abdominal hysterectomy; Colonoscopy (11/03/1998); Eye surgery (Bilateral, 11/03/2012); Artery Biopsy (Left, 12/18/2015); and melanoma excisison (Right). Ms. AsCouseas a current medication list which includes the following prescription(s): acetaminophen, alendronate, vitamin c, aspirin-acetaminophen-caffeine, calcium carb-cholecalciferol, vitamin d3, cvs vitamin b12, dicyclomine, ferrous gluconate, gabapentin, hydrocodone-acetaminophen, inulin, magnesium, multivitamin with minerals, nortriptyline, oxycodone-acetaminophen, pantoprazole, tizanidine, topiramate, zolpidem, amoxicillin-clavulanate, ibuprofen, and methylprednisolone. She  reports that she has never smoked. She has never used smokeless tobacco. She reports that she does not drink alcohol and does not use drugs. Ms. AsDicostanzos allergic to elastic bandages & [zinc] and morphine.   HPI  Today, she is being contacted for a post-procedure assessment.   Post-procedure evaluation  LEFT Therapeutic Glenohumeral Joint (shoulder) Injection #2 along with periscapular and thoracic and sacral trigger point injections for myofascial pain syndrome  Effectiveness:  Initial hour after procedure: 0 %  Subsequent 4-6 hours  post-procedure: 0 %  Analgesia past initial 6 hours: 60 % (pain was worse that evening and then began improving the next day.) Ongoing improvement:  Analgesic:  60% however feels that pain is  returning Function: [name] reports improvement in function ROM: [name] reports improvement in ROM   Patient would like to repeat shoulder steroid injection Also having increased lumbar spine pain related to lumbar facet arthropathy and lumbar spondylosis, patient requesting repeat lumbar facet medial branch nerve blocks.  Laboratory Chemistry Profile   Renal Lab Results  Component Value Date   BUN 11 08/14/2014   CREATININE 1.00 06/17/2021   GFRAA >90 08/14/2014   GFRNONAA 89 (L) 08/14/2014    Hepatic No results found for: AST, ALT, ALBUMIN, ALKPHOS, HCVAB, AMYLASE, LIPASE, AMMONIA  Electrolytes Lab Results  Component Value Date   NA 139 08/14/2014   K 4.0 08/14/2014   CL 99 08/14/2014   CALCIUM 10.2 08/14/2014    Bone No results found for: VD25OH, VD125OH2TOT, ZO1096EA5, WU9811BJ4, 25OHVITD1, 25OHVITD2, 25OHVITD3, TESTOFREE, TESTOSTERONE  Inflammation (CRP: Acute Phase) (ESR: Chronic Phase) No results found for: CRP, ESRSEDRATE, LATICACIDVEN       Note: Above Lab results reviewed.  Assessment  The primary encounter diagnosis was Chronic left shoulder pain. Diagnoses of Myofascial pain syndrome, Dysfunction of left rotator cuff, Lumbar facet arthropathy (L3/4 and L4/5), Lumbar spondylosis, and Chronic pain syndrome were also pertinent to this visit.  Plan of Care   1. Chronic left shoulder pain - SHOULDER INJECTION; Future  2. Lumbar facet arthropathy (L3/4 and L4/5) - LUMBAR FACET(MEDIAL BRANCH NERVE BLOCK) MBNB; Future   Orders:  Orders Placed This Encounter  Procedures   SHOULDER INJECTION    Standing Status:   Future    Standing Expiration Date:   12/22/2021    Scheduling Instructions:     Procedure: Intra-articular shoulder (Glenohumeral) joint injection      Side: LEFT     Level: Glenohumeral joint               Sedation: Patient's choice.     Timeframe: As permitted by the schedule    Order Specific Question:   Where will this procedure be performed?    Answer:   [facility]   LUMBAR FACET(MEDIAL BRANCH NERVE BLOCK) MBNB    Standing Status:   Future    Standing Expiration Date:   11/21/2021    Scheduling Instructions:     Procedure: Lumbar facet block (AKA.: Lumbosacral medial branch nerve block)     Side: Bilateral     Level: L3-4, L4-5, Facets (L3, L4, L5, &Medial Branch Nerves)     Timeframe: ASAA    Order Specific Question:   Where will this procedure be performed?    Answer:   [facility]   Follow-up plan:   Return in about 2 days (around 10/23/2021) for Left shoulder joint injection + B/L L3, 4, 5 MBNB , without sedation (40 mins).     Status post left L3, L4, L5, S1 RFA on 06/15/2019, R L3,4,5, S1 RFA on 06/29/2019, left posterior glenohumeral shoulder steroid injection 01/02/2020: Not effective.  Left L4 and Sprint peripheral nerve stimulation on 04/25/2020, removed 06/11/2020.  Left suprascapular nerve block 07/01/2021               Recent Visits Date Type Provider Dept  10/02/21 Procedure visit Gillis Santa, MD [facility]  09/23/21 Office Visit Gillis Santa, MD [facility]  08/19/21 Procedure visit Gillis Santa, MD [facility]  08/05/21 Office Visit Gillis Santa, MD [facility]  07/23/21 Procedure visit Gillis Santa, MD [facility]  Showing recent visits within past 90 days and meeting all  other requirements Today's Visits Date Type Provider Dept  10/21/21 Office Visit Gillis Santa, MD [facility]  Showing today's visits and meeting all other requirements Future Appointments No visits were found meeting these conditions. Showing future appointments within next 90 days and meeting all other requirements  I discussed the assessment and treatment  plan with the patient. The patient was provided an opportunity to ask questions and all were answered. The patient agreed with the plan and demonstrated an understanding of the instructions.  Patient advised to call back or seek an in-person evaluation if the symptoms or condition worsens.  Duration of encounter: 73mnutes.  Note by: BGillis Santa MD Date: 10/21/2021; Time: 2:51 PM

## 2021-10-22 NOTE — Patient Instructions (Signed)

## 2021-10-23 ENCOUNTER — Ambulatory Visit
Admission: RE | Admit: 2021-10-23 | Discharge: 2021-10-23 | Disposition: A | Discharge status: Home / Self Care | Payer: Medicare Other | Source: Ambulatory Visit | Attending: Student in an Organized Health Care Education/Training Program | Admitting: Student in an Organized Health Care Education/Training Program

## 2021-10-23 ENCOUNTER — Ambulatory Visit (HOSPITAL_BASED_OUTPATIENT_CLINIC_OR_DEPARTMENT_OTHER): Payer: Medicare Other | Admitting: Student in an Organized Health Care Education/Training Program

## 2021-10-23 ENCOUNTER — Encounter: Payer: Self-pay | Admitting: Student in an Organized Health Care Education/Training Program

## 2021-10-23 ENCOUNTER — Other Ambulatory Visit: Payer: Self-pay

## 2021-10-23 VITALS — BP 155/97 | HR 89 | Temp 96.9°F | Resp 15 | Ht 66.0 in | Wt 130.0 lb

## 2021-10-23 DIAGNOSIS — G894 Chronic pain syndrome: Secondary | ICD-10-CM | POA: Diagnosis present

## 2021-10-23 DIAGNOSIS — M47816 Spondylosis without myelopathy or radiculopathy, lumbar region: Secondary | ICD-10-CM

## 2021-10-23 DIAGNOSIS — G8929 Other chronic pain: Secondary | ICD-10-CM

## 2021-10-23 DIAGNOSIS — M25512 Pain in left shoulder: Secondary | ICD-10-CM | POA: Insufficient documentation

## 2021-10-23 MED ORDER — ROPIVACAINE HCL 2 MG/ML IJ SOLN
9.0000 mL | Freq: Once | INTRAMUSCULAR | Status: AC
  Administered 2021-10-23: 11:00:00 20 mL via PERINEURAL

## 2021-10-23 MED ORDER — LIDOCAINE HCL 2 % IJ SOLN
20.0000 mL | Freq: Once | INTRAMUSCULAR | Status: AC
  Administered 2021-10-23: 11:00:00 100 mg
  Filled 2021-10-23: qty 10

## 2021-10-23 MED ORDER — IOHEXOL 180 MG/ML  SOLN
10.0000 mL | Freq: Once | INTRAMUSCULAR | Status: AC
  Administered 2021-10-23: 11:00:00 10 mL via INTRA_ARTICULAR
  Filled 2021-10-23: qty 10

## 2021-10-23 MED ORDER — DEXAMETHASONE SODIUM PHOSPHATE 10 MG/ML IJ SOLN
10.0000 mg | Freq: Once | INTRAMUSCULAR | Status: AC
  Administered 2021-10-23: 11:00:00 10 mg
  Filled 2021-10-23: qty 1

## 2021-10-23 MED ORDER — METHYLPREDNISOLONE ACETATE 40 MG/ML IJ SUSP
40.0000 mg | Freq: Once | INTRAMUSCULAR | Status: AC
  Administered 2021-10-23: 11:00:00 40 mg via INTRA_ARTICULAR
  Filled 2021-10-23: qty 1

## 2021-10-23 NOTE — Progress Notes (Signed)
PROVIDER NOTE: Information contained herein reflects review and annotations entered in association with encounter. Interpretation of such information and data should be left to medically-trained personnel. Information provided to patient can be located elsewhere in the medical record under "Patient Instructions". Document created using STT-dictation technology, any transcriptional errors that may result from process are unintentional.    Patient: Teresa [name]  Service Category: Procedure  Provider: Gillis Santa, MD  DOB: 01/29/1956  DOS: 10/23/2021  Location: [facility]  MRN: [id number]  Setting: Ambulatory - outpatient  Referring Provider: Tracie Harrier, MD  Type: Established Patient  Specialty: Interventional Pain Management  PCP: Tracie Harrier, MD   Primary Reason for Visit: Interventional Pain Management Treatment. CC: Shoulder Pain (left)   Procedure:          Anesthesia, Analgesia, Anxiolysis:  Type: Therapeutic Glenohumeral Joint (shoulder) Injection #3  Primary Purpose: Therapeutic Region: Posterolateral Shoulder Area Level:  Shoulder Target Area: Glenohumeral Joint (shoulder) Approach: Posterior approach. Laterality: Left-Sided  Type: Local Anesthesia  Local Anesthetic: Lidocaine 1-2%  Position: Prone   Indications: 1. Chronic left shoulder pain   2. Lumbar facet arthropathy (L3/4 and L4/5)   3. Lumbar spondylosis   4. Chronic pain syndrome    Pain Score: Pre-procedure: 6 /10 Post-procedure: 4 /10   Pre-op Assessment:  Teresa [name] is a 65 y.o. (year old), female patient, seen today for interventional treatment. She  has a past surgical history that includes Abdominal hysterectomy; Colonoscopy (11/03/1998); Eye surgery (Bilateral, 11/03/2012); Artery Biopsy (Left, 12/18/2015); and melanoma excisison (Right). Teresa [name] has a current medication list which includes the following prescription(s): acetaminophen, alendronate, vitamin c,  aspirin-acetaminophen-caffeine, calcium carb-cholecalciferol, vitamin d3, cvs vitamin b12, dicyclomine, ferrous gluconate, gabapentin, ibuprofen, inulin, magnesium, nortriptyline, oxycodone-acetaminophen, pantoprazole, tizanidine, topiramate, zolpidem, amoxicillin-clavulanate, hydrocodone-acetaminophen, methylprednisolone, and multivitamin with minerals. Her primarily concern today is the Shoulder Pain (left)   Initial Vital Signs:  Pulse/HCG Rate: 89ECG Heart Rate: 80 Temp: (!) 96.9 F (36.1 C) Resp: 18 BP: (!) 154/90 SpO2: 96 %  BMI: Estimated body mass index is 20.98 kg/m as calculated from the following:   Height as of this encounter: 5\' 6"  (1.676 m).   Weight as of this encounter: 130 lb (59 kg).  Risk Assessment: Allergies: Reviewed. She is allergic to elastic bandages & [zinc] and morphine.  Allergy Precautions: None required Coagulopathies: Reviewed. None identified.  Blood-thinner therapy: None at this time Active Infection(s): Reviewed. None identified. Teresa [name] is afebrile  Site Confirmation: Teresa [name] was asked to confirm the procedure and laterality before marking the site Procedure checklist: Completed Consent: Before the procedure and under the influence of no sedative(s), amnesic(s), or anxiolytics, the patient was informed of the treatment options, risks and possible complications. To fulfill our ethical and legal obligations, as recommended by the American Medical Association's Code of Ethics, I have informed the patient of my clinical impression; the nature and purpose of the treatment or procedure; the risks, benefits, and possible complications of the intervention; the alternatives, including doing nothing; the risk(s) and benefit(s) of the alternative treatment(s) or procedure(s); and the risk(s) and benefit(s) of doing nothing. The patient was provided information about the general risks and possible complications associated with the procedure. These may include,  but are not limited to: failure to achieve desired goals, infection, bleeding, organ or nerve damage, allergic reactions, paralysis, and death. In addition, the patient was informed of those risks and complications associated to the procedure, such as failure to decrease pain; infection; bleeding; organ  or nerve damage with subsequent damage to sensory, motor, and/or autonomic systems, resulting in permanent pain, numbness, and/or weakness of one or several areas of the body; allergic reactions; (i.e.: anaphylactic reaction); and/or death. Furthermore, the patient was informed of those risks and complications associated with the medications. These include, but are not limited to: allergic reactions (i.e.: anaphylactic or anaphylactoid reaction(s)); adrenal axis suppression; blood sugar elevation that in diabetics may result in ketoacidosis or comma; water retention that in patients with history of congestive heart failure may result in shortness of breath, pulmonary edema, and decompensation with resultant heart failure; weight gain; swelling or edema; medication-induced neural toxicity; particulate matter embolism and blood vessel occlusion with resultant organ, and/or nervous system infarction; and/or aseptic necrosis of one or more joints. Finally, the patient was informed that Medicine is not an exact science; therefore, there is also the possibility of unforeseen or unpredictable risks and/or possible complications that may result in a catastrophic outcome. The patient indicated having understood very clearly. We have given the patient no guarantees and we have made no promises. Enough time was given to the patient to ask questions, all of which were answered to the patient's satisfaction. Teresa [name] has indicated that she wanted to continue with the procedure. Attestation: I, the ordering provider, attest that I have discussed with the patient the benefits, risks, side-effects, alternatives, likelihood of  achieving goals, and potential problems during recovery for the procedure that I have provided informed consent. Date   Time: 10/23/2021 10:00 AM  Pre-Procedure Preparation:  Monitoring: As per clinic protocol. Respiration, ETCO2, SpO2, BP, heart rate and rhythm monitor placed and checked for adequate function Safety Precautions: Patient was assessed for positional comfort and pressure points before starting the procedure. Time-out: I initiated and conducted the "Time-out" before starting the procedure, as per protocol. The patient was asked to participate by confirming the accuracy of the "Time Out" information. Verification of the correct person, site, and procedure were performed and confirmed by me, the nursing staff, and the patient. "Time-out" conducted as per Joint Commission's Universal Protocol (UP.01.01.01). Time: 1102  Description of Procedure:          Area Prepped: Entire shoulder Area Prepping solution: DuraPrep (Iodine Povacrylex [0.7% available iodine] and Isopropyl Alcohol, 74% w/w) Safety Precautions: Aspiration looking for blood return was conducted prior to all injections. At no point did we inject any substances, as a needle was being advanced. No attempts were made at seeking any paresthesias. Safe injection practices and needle disposal techniques used. Medications properly checked for expiration dates. SDV (single dose vial) medications used. Description of the Procedure: Protocol guidelines were followed. The patient was placed in position over the procedure table. The target area was identified and the area prepped in the usual manner. Skin & deeper tissues infiltrated with local anesthetic. Appropriate amount of time allowed to pass for local anesthetics to take effect. The procedure needles were then advanced to the target area. Proper needle placement secured. Negative aspiration confirmed. Solution injected in intermittent fashion, asking for systemic symptoms every 0.5cc  of injectate. The needles were then removed and the area cleansed, making sure to leave some of the prepping solution back to take advantage of its long term bactericidal properties.    Vitals:   10/23/21 1110 10/23/21 1115 10/23/21 1120 10/23/21 1122  BP: (!) 145/81 (!) 150/81 (!) 160/85 (!) 155/97  Pulse:      Resp: 15 12 13 15   Temp:      TempSrc:  SpO2: 100% 98% 100% 100%  Weight:      Height:        Start Time: 1102 hrs. [name] Time: 1109 hrs. Materials:  Needle(s) Type: Spinal Needle Gauge: 22G Length: 3.5-in Medication(s): Please see orders for medications and dosing details. 5 cc solution made of 4 cc of 0.2% ropivacaine, 1 cc of methylprednisolone, 40 mg/cc was injected into the left glenohumeral joint after contrast confirmation No complications. Imaging Guidance (Non-Spinal):          Type of Imaging Technique: Fluoroscopy Guidance (Non-Spinal) Indication(s): Assistance in needle guidance and placement for procedures requiring needle placement in or near specific anatomical locations not easily accessible without such assistance. Exposure Time: Please see nurses notes. Contrast: Before injecting any contrast, we confirmed that the patient did not have an allergy to iodine, shellfish, or radiological contrast. Once satisfactory needle placement was completed at the desired level, radiological contrast was injected. Contrast injected under live fluoroscopy. No contrast complications. See chart for type and volume of contrast used. Fluoroscopic Guidance: I was personally present during the use of fluoroscopy. "Tunnel Vision Technique" used to obtain the best possible view of the target area. Parallax error corrected before commencing the procedure. "Direction-depth-direction" technique used to introduce the needle under continuous pulsed fluoroscopy. Once target was reached, antero-posterior, oblique, and lateral fluoroscopic projection used confirm needle placement in all  planes. Images permanently stored in EMR. Interpretation: I personally interpreted the imaging intraoperatively. Adequate needle placement confirmed in multiple planes. Appropriate spread of contrast into desired area was observed. No evidence of afferent or efferent intravascular uptake. Permanent images saved into the patient's record.  Post-operative Assessment:  Post-procedure Vital Signs:  Pulse/HCG Rate: 8982 Temp: (!) 96.9 F (36.1 C) Resp: 15 BP: (!) 155/97 SpO2: 100 %  EBL: None  Complications: No immediate post-treatment complications observed by team, or reported by patient.  Note: The patient tolerated the entire procedure well. A repeat set of vitals were taken after the procedure and the patient was kept under observation following institutional policy, for this type of procedure. Post-procedural neurological assessment was performed, showing return to baseline, prior to discharge. The patient was provided with post-procedure discharge instructions, including a section on how to identify potential problems. Should any problems arise concerning this procedure, the patient was given instructions to immediately contact us, at any time, without hesitation. In any case, we plan to contact the patient by telephone for a follow-up status report regarding this interventional procedure.  Comments:  No additional relevant information.  Plan of Care   Orders:  Orders Placed This Encounter  Procedures   DG PAIN [facility] C-ARM 1-60 MIN NO REPORT    Intraoperative interpretation by procedural physician at [facility].    Standing Status:   Standing    Number of Occurrences:   1    Order Specific Question:   Reason for exam:    Answer:   Assistance in needle guidance and placement for procedures requiring needle placement in or near specific anatomical locations not easily accessible without such assistance.    Medications ordered for procedure: Meds ordered this encounter   Medications   iohexol (OMNIPAQUE) 180 MG/ML injection 10 mL    Must be Myelogram-compatible. If not available, you may substitute with a water-soluble, non-ionic, hypoallergenic, myelogram-compatible radiological contrast medium.   lidocaine (XYLOCAINE) 2 % (with pres) injection 400 mg   dexamethasone (DECADRON) injection 10 mg   dexamethasone (DECADRON) injection 10 mg   ropivacaine (PF) 2 mg/mL (  0.2%) (NAROPIN) injection 9 mL   ropivacaine (PF) 2 mg/mL (0.2%) (NAROPIN) injection 9 mL   methylPREDNISolone acetate (DEPO-MEDROL) injection 40 mg   Medications administered: We administered iohexol, lidocaine, dexamethasone, dexamethasone, ropivacaine (PF) 2 mg/mL (0.2%), ropivacaine (PF) 2 mg/mL (0.2%), and methylPREDNISolone acetate.  See the medical record for exact dosing, route, and time of administration.  Follow-up plan:   Return in about 6 weeks (around 12/04/2021) for Post Procedure Evaluation, virtual.     Recent Visits Date Type Provider Dept  10/21/21 Office Visit Gillis Santa, MD [facility]  10/02/21 Procedure visit Gillis Santa, MD [facility]  09/23/21 Office Visit Gillis Santa, MD [facility]  08/19/21 Procedure visit Gillis Santa, MD [facility]  08/05/21 Office Visit Gillis Santa, MD [facility]  Showing recent visits within past 90 days and meeting all other requirements Today's Visits Date Type Provider Dept  10/23/21 Procedure visit Gillis Santa, MD [facility]  Showing today's visits and meeting all other requirements Future Appointments Date Type Provider Dept  11/26/21 Appointment Gillis Santa, MD [facility]  Showing future appointments within next 90 days and meeting all other requirements   Disposition: Discharge home  Discharge (Date   Time): 10/23/2021; 1130 hrs.   Primary Care Physician: Tracie Harrier, MD Location: [facility] Note by: Gillis Santa, MD Date: 10/23/2021; Time: 11:29 AM  Disclaimer:  Medicine is not an exact science. The only guarantee in medicine is that nothing is guaranteed. It is important to note that the decision to proceed with this intervention was based on the information collected from the patient. The Data and conclusions were drawn from the patient's questionnaire, the interview, and the physical examination. Because the information was provided in large part by the patient, it cannot be guaranteed that it has not been purposely or unconsciously manipulated. Every effort has been made to obtain as much relevant data as possible for this evaluation. It is important to note that the conclusions that lead to this procedure are derived in large part from the available data. Always take into account that the treatment will also be dependent on availability of resources and existing treatment guidelines, considered by other Pain Management Practitioners as being common knowledge and practice, at the time of the intervention. For Medico-Legal purposes, it is also important to point out that variation in procedural techniques and pharmacological choices are the acceptable norm. The indications, contraindications, technique, and results of the above procedure should only be interpreted and judged by a Board-Certified Interventional Pain Specialist with extensive familiarity and expertise in the same exact procedure and technique.

## 2021-10-23 NOTE — Progress Notes (Signed)
PROVIDER NOTE: Information contained herein reflects review and annotations entered in association with encounter. Interpretation of such information and data should be left to medically-trained personnel. Information provided to patient can be located elsewhere in the medical record under "Patient Instructions". Document created using STT-dictation technology, any transcriptional errors that may result from process are unintentional.    Patient: Teresa [name]  Service Category: Procedure  Provider: Gillis Santa, MD  DOB: 1956/04/06  DOS: 10/23/2021  Location: [facility]  MRN: [id number]  Setting: Ambulatory - outpatient  Referring Provider: Tracie Harrier, MD  Type: Established Patient  Specialty: Interventional Pain Management  PCP: Tracie Harrier, MD   Primary Reason for Visit: Interventional Pain Management Treatment. CC: low back pain    Procedure #1:  Anesthesia, Analgesia, Anxiolysis:  Type: Lumbar Facet, Medial Branch Block(s)  Primary Purpose: Therapeutic/palliative Region: Posterolateral Lumbosacral Spine Level: L3, L4, L5, Medial Branch Level(s). Injecting these levels blocks the L3-4, L4-5, lumbar facet joints. Laterality: Bilateral  Type: Local Anesthesia  Local Anesthetic: Lidocaine 1-2%  Position: Prone    Pre-op Assessment:  Ms. Mckenna is a 65 y.o. (year old), female patient, seen today for interventional treatment. She  has a past surgical history that includes Abdominal hysterectomy; Colonoscopy (11/03/1998); Eye surgery (Bilateral, 11/03/2012); Artery Biopsy (Left, 12/18/2015); and melanoma excisison (Right). Ms. Rossie has a current medication list which includes the following prescription(s): acetaminophen, alendronate, vitamin c, aspirin-acetaminophen-caffeine, calcium carb-cholecalciferol, vitamin d3, cvs vitamin b12, dicyclomine, ferrous gluconate, gabapentin, ibuprofen, inulin, magnesium, nortriptyline, oxycodone-acetaminophen, pantoprazole,  tizanidine, topiramate, zolpidem, amoxicillin-clavulanate, hydrocodone-acetaminophen, methylprednisolone, and multivitamin with minerals. Her primarily concern today is the Shoulder Pain (left)   Initial Vital Signs:  Pulse/HCG Rate: 89ECG Heart Rate: 80 Temp: (!) 96.9 F (36.1 C) Resp: 18 BP: (!) 154/90 SpO2: 96 %  BMI: Estimated body mass index is 20.98 kg/m as calculated from the following:   Height as of this encounter: 5\' 6"  (1.676 m).   Weight as of this encounter: 130 lb (59 kg).  Risk Assessment: Allergies: Reviewed. She is allergic to elastic bandages & [zinc] and morphine.  Allergy Precautions: None required Coagulopathies: Reviewed. None identified.  Blood-thinner therapy: None at this time Active Infection(s): Reviewed. None identified. Ms. Bonus is afebrile  Site Confirmation: Ms. Swallow was asked to confirm the procedure and laterality before marking the site Procedure checklist: Completed Consent: Before the procedure and under the influence of no sedative(s), amnesic(s), or anxiolytics, the patient was informed of the treatment options, risks and possible complications. To fulfill our ethical and legal obligations, as recommended by the American Medical Association's Code of Ethics, I have informed the patient of my clinical impression; the nature and purpose of the treatment or procedure; the risks, benefits, and possible complications of the intervention; the alternatives, including doing nothing; the risk(s) and benefit(s) of the alternative treatment(s) or procedure(s); and the risk(s) and benefit(s) of doing nothing. The patient was provided information about the general risks and possible complications associated with the procedure. These may include, but are not limited to: failure to achieve desired goals, infection, bleeding, organ or nerve damage, allergic reactions, paralysis, and death. In addition, the patient was informed of those risks and complications  associated to Spine-related procedures, such as failure to decrease pain; infection (i.e.: Meningitis, epidural or intraspinal abscess); bleeding (i.e.: epidural hematoma, subarachnoid hemorrhage, or any other type of intraspinal or peri-dural bleeding); organ or nerve damage (i.e.: Any type of peripheral nerve, nerve root, or spinal cord injury) with subsequent damage to  sensory, motor, and/or autonomic systems, resulting in permanent pain, numbness, and/or weakness of one or several areas of the body; allergic reactions; (i.e.: anaphylactic reaction); and/or death. Furthermore, the patient was informed of those risks and complications associated with the medications. These include, but are not limited to: allergic reactions (i.e.: anaphylactic or anaphylactoid reaction(s)); adrenal axis suppression; blood sugar elevation that in diabetics may result in ketoacidosis or comma; water retention that in patients with history of congestive heart failure may result in shortness of breath, pulmonary edema, and decompensation with resultant heart failure; weight gain; swelling or edema; medication-induced neural toxicity; particulate matter embolism and blood vessel occlusion with resultant organ, and/or nervous system infarction; and/or aseptic necrosis of one or more joints. Finally, the patient was informed that Medicine is not an exact science; therefore, there is also the possibility of unforeseen or unpredictable risks and/or possible complications that may result in a catastrophic outcome. The patient indicated having understood very clearly. We have given the patient no guarantees and we have made no promises. Enough time was given to the patient to ask questions, all of which were answered to the patient's satisfaction. Ms. Gunnels has indicated that she wanted to continue with the procedure. Attestation: I, the ordering provider, attest that I have discussed with the patient the benefits, risks, side-effects,  alternatives, likelihood of achieving goals, and potential problems during recovery for the procedure that I have provided informed consent. Date   Time: 10/23/2021 10:00 AM  Pre-Procedure Preparation:  Monitoring: As per clinic protocol. Respiration, ETCO2, SpO2, BP, heart rate and rhythm monitor placed and checked for adequate function Safety Precautions: Patient was assessed for positional comfort and pressure points before starting the procedure. Time-out: I initiated and conducted the "Time-out" before starting the procedure, as per protocol. The patient was asked to participate by confirming the accuracy of the "Time Out" information. Verification of the correct person, site, and procedure were performed and confirmed by me, the nursing staff, and the patient. "Time-out" conducted as per Joint Commission's Universal Protocol (UP.01.01.01). Time: 1102  Description of Procedure #1:  Laterality: Bilateral. The procedure was performed in identical fashion on both sides. Levels: L3, L4, L5, Medial Branch Level(s) Area Prepped: Posterior Lumbosacral Region Prepping solution: ChloraPrep (2% chlorhexidine gluconate and 70% isopropyl alcohol) Safety Precautions: Aspiration looking for blood return was conducted prior to all injections. At no point did we inject any substances, as a needle was being advanced. Before injecting, the patient was told to immediately notify me if she was experiencing any new onset of "ringing in the ears, or metallic taste in the mouth". No attempts were made at seeking any paresthesias. Safe injection practices and needle disposal techniques used. Medications properly checked for expiration dates. SDV (single dose vial) medications used. After the completion of the procedure, all disposable equipment used was discarded in the proper designated medical waste containers. Local Anesthesia: Protocol guidelines were followed. The patient was positioned over the fluoroscopy table.  The area was prepped in the usual manner. The time-out was completed. The target area was identified using fluoroscopy. A 12-in long, straight, sterile hemostat was used with fluoroscopic guidance to locate the targets for each level blocked. Once located, the skin was marked with an approved surgical skin marker. Once all sites were marked, the skin (epidermis, dermis, and hypodermis), as well as deeper tissues (fat, connective tissue and muscle) were infiltrated with a small amount of a short-acting local anesthetic, loaded on a 10cc syringe with a 25G, 1.5-in  Needle. An appropriate amount of time was allowed for local anesthetics to take effect before proceeding to the next step. Local Anesthetic: Lidocaine 2.0% The unused portion of the local anesthetic was discarded in the proper designated containers. Technical explanation of process:   L3 Medial Branch Nerve Block (MBB): The target area for the L3 medial branch is at the junction of the postero-lateral aspect of the superior articular process and the superior, posterior, and medial edge of the transverse process of L4. Under fluoroscopic guidance, a Quincke needle was inserted until contact was made with os over the superior postero-lateral aspect of the pedicular shadow (target area). After negative aspiration for blood, 21mL of the nerve block solution was injected without difficulty or complication. The needle was removed intact. L4 Medial Branch Nerve Block (MBB): The target area for the L4 medial branch is at the junction of the postero-lateral aspect of the superior articular process and the superior, posterior, and medial edge of the transverse process of L5. Under fluoroscopic guidance, a Quincke needle was inserted until contact was made with os over the superior postero-lateral aspect of the pedicular shadow (target area). After negative aspiration for blood, 1 mL of the nerve block solution was injected without difficulty or complication. The  needle was removed intact. L5 Medial Branch Nerve Block (MBB): The target area for the L5 medial branch is at the junction of the postero-lateral aspect of the superior articular process and the superior, posterior, and medial edge of the sacral ala. Under fluoroscopic guidance, a Quincke needle was inserted until contact was made with os over the superior postero-lateral aspect of the pedicular shadow (target area). After negative aspiration for blood, 53mL of the nerve block solution was injected without difficulty or complication. The needle was removed intact.  Nerve block solution: 12cc solution made of 10 cc of 0.2% ropivacaine, 2 cc of Decadron 10 mg/cc.  2 cc injected at each level above bilaterally.  Total steroid dose: 20 mg Decadron Procedural Needles: 22-gauge, 3.5-inch, Quincke needles used for all levels.  Once the entire procedure was completed, the treated area was cleaned, making sure to leave some of the prepping solution back to take advantage of its long term bactericidal properties.   Illustration of the posterior view of the lumbar spine and the posterior neural structures. Laminae of L2 through S1 are labeled. DPRL5, dorsal primary ramus of L5; DPRS1, dorsal primary ramus of S1; DPR3, dorsal primary ramus of L3; FJ, facet (zygapophyseal) joint L3-L4; I, inferior articular process of L4; LB1, lateral branch of dorsal primary ramus of L1; IAB, inferior articular branches from L3 medial branch (supplies L4-L5 facet joint); IBP, intermediate branch plexus; MB3, medial branch of dorsal primary ramus of L3; NR3, third lumbar nerve root; S, superior articular process of L5; SAB, superior articular branches from L4 (supplies L4-5 facet joint also); TP3, transverse process of L3.  Vitals:   10/23/21 1110 10/23/21 1115 10/23/21 1120 10/23/21 1122  BP: (!) 145/81 (!) 150/81 (!) 160/85 (!) 155/97  Pulse:      Resp: 15 12 13 15   Temp:      TempSrc:      SpO2: 100% 98% 100% 100%  Weight:       Height:         Start Time: 1102 hrs. [name] Time: 1109 hrs.  Imaging Guidance (Spinal):          Type of Imaging Technique: Fluoroscopy Guidance (Spinal) Indication(s): Assistance in needle guidance and placement for procedures  requiring needle placement in or near specific anatomical locations not easily accessible without such assistance. Exposure Time: Please see nurses notes. Contrast: None used. Fluoroscopic Guidance: I was personally present during the use of fluoroscopy. "Tunnel Vision Technique" used to obtain the best possible view of the target area. Parallax error corrected before commencing the procedure. "Direction-depth-direction" technique used to introduce the needle under continuous pulsed fluoroscopy. Once target was reached, antero-posterior, oblique, and lateral fluoroscopic projection used confirm needle placement in all planes. Images permanently stored in EMR. Interpretation: No contrast injected. I personally interpreted the imaging intraoperatively. Adequate needle placement confirmed in multiple planes. Permanent images saved into the patient's record.  Post-operative Assessment:  Post-procedure Vital Signs:  Pulse/HCG Rate: 8982 Temp: (!) 96.9 F (36.1 C) Resp: 15 BP: (!) 155/97 SpO2: 100 %  EBL: None  Complications: No immediate post-treatment complications observed by team, or reported by patient.  Note: The patient tolerated the entire procedure well. A repeat set of vitals were taken after the procedure and the patient was kept under observation following institutional policy, for this type of procedure. Post-procedural neurological assessment was performed, showing return to baseline, prior to discharge. The patient was provided with post-procedure discharge instructions, including a section on how to identify potential problems. Should any problems arise concerning this procedure, the patient was given instructions to immediately contact us, at any time,  without hesitation. In any case, we plan to contact the patient by telephone for a follow-up status report regarding this interventional procedure.  Comments:  No additional relevant information.  5 out of 5 strength bilateral lower extremity: Plantar flexion, dorsiflexion, knee flexion, knee extension.  Plan of Care  Orders:  Orders Placed This Encounter  Procedures   DG PA[facility] C-ARM 1-60 MIN NO REPORT    Intraoperative interpretation by procedural physician at [place].    Standing Status:   Standing    Number of Occurrences:   1    Order Specific Question:   Reason for exam:    Answer:   Assistance in needle guidance and placement for procedures requiring needle placement in or near specific anatomical locations not easily accessible without such assistance.    Medications ordered for procedure: Meds ordered this encounter  Medications   iohexol (OMNIPAQUE) 180 MG/ML injection 10 mL    Must be Myelogram-compatible. If not available, you may substitute with a water-soluble, non-ionic, hypoallergenic, myelogram-compatible radiological contrast medium.   lidocaine (XYLOCAINE) 2 % (with pres) injection 400 mg   dexamethasone (DECADRON) injection 10 mg   dexamethasone (DECADRON) injection 10 mg   ropivacaine (PF) 2 mg/mL (0.2%) (NAROPIN) injection 9 mL   ropivacaine (PF) 2 mg/mL (0.2%) (NAROPIN) injection 9 mL   methylPREDNISolone acetate (DEPO-MEDROL) injection 40 mg   Medications administered: We administered iohexol, lidocaine, dexamethasone, dexamethasone, ropivacaine (PF) 2 mg/mL (0.2%), ropivacaine (PF) 2 mg/mL (0.2%), and methylPREDNISolone acetate.  See the medical record for exact dosing, route, and time of administration.  Follow-up plan:   Return in about 6 weeks (around 12/04/2021) for Post Procedure Evaluation, virtual.      Recent Visits Date Type Provider Dept  10/21/21 Office Visit Gillis Santa, MD [facility]  10/02/21 Procedure  visit Gillis Santa, MD [facility]  09/23/21 Office Visit Gillis Santa, MD [facility]  08/19/21 Procedure visit Gillis Santa, MD [facility]  08/05/21 Office Visit Gillis Santa, MD [facility]  Showing recent visits within past 90 days and meeting all other requirements  Today's Visits Date Type Provider Dept  10/23/21 Procedure visit Gillis Santa, MD [facility]  Showing today's visits and meeting all other requirements Future Appointments Date Type Provider Dept  11/26/21 Appointment Gillis Santa, MD [facility]  Showing future appointments within next 90 days and meeting all other requirements  Disposition: Discharge home  Discharge (Date   Time): 10/23/2021; 1130 hrs.   Primary Care Physician: Tracie Harrier, MD Location: [facility] Note by: Gillis Santa, MD Date: 10/23/2021; Time: 11:30 AM  Disclaimer:  Medicine is not an exact science. The only guarantee in medicine is that nothing is guaranteed. It is important to note that the decision to proceed with this intervention was based on the information collected from the patient. The Data and conclusions were drawn from the patient's questionnaire, the interview, and the physical examination. Because the information was provided in large part by the patient, it cannot be guaranteed that it has not been purposely or unconsciously manipulated. Every effort has been made to obtain as much relevant data as possible for this evaluation. It is important to note that the conclusions that lead to this procedure are derived in large part from the available data. Always take into account that the treatment will also be dependent on availability of resources and existing treatment guidelines, considered by other Pain Management Practitioners as being common knowledge and practice, at the time of the intervention. For Medico-Legal purposes, it is also  important to point out that variation in procedural techniques and pharmacological choices are the acceptable norm. The indications, contraindications, technique, and results of the above procedure should only be interpreted and judged by a Board-Certified Interventional Pain Specialist with extensive familiarity and expertise in the same exact procedure and technique.

## 2021-10-23 NOTE — Patient Instructions (Signed)

## 2021-10-23 NOTE — Progress Notes (Signed)
Safety precautions to be maintained throughout the outpatient stay will include: orient to surroundings, keep bed in low position, maintain call bell within reach at all times, provide assistance with transfer out of bed and ambulation.  

## 2021-10-24 ENCOUNTER — Telehealth: Payer: Self-pay | Admitting: *Deleted

## 2021-10-24 NOTE — Telephone Encounter (Signed)
Called for post procedure follow-up. Complaining of pain in neck and shoulder. Informed pt that this may be due to inflammation from the procedure or the uncomfortable position required to do the procedure.

## 2021-11-18 ENCOUNTER — Encounter: Payer: Self-pay | Admitting: Oncology

## 2021-11-26 ENCOUNTER — Other Ambulatory Visit: Payer: Self-pay

## 2021-11-26 ENCOUNTER — Encounter: Payer: Self-pay | Admitting: Student in an Organized Health Care Education/Training Program

## 2021-11-26 ENCOUNTER — Ambulatory Visit
Payer: No Typology Code available for payment source | Attending: Student in an Organized Health Care Education/Training Program | Admitting: Student in an Organized Health Care Education/Training Program

## 2021-11-26 DIAGNOSIS — M7918 Myalgia, other site: Secondary | ICD-10-CM | POA: Diagnosis not present

## 2021-11-26 DIAGNOSIS — M25512 Pain in left shoulder: Secondary | ICD-10-CM | POA: Diagnosis not present

## 2021-11-26 DIAGNOSIS — M67912 Unspecified disorder of synovium and tendon, left shoulder: Secondary | ICD-10-CM | POA: Diagnosis not present

## 2021-11-26 DIAGNOSIS — G894 Chronic pain syndrome: Secondary | ICD-10-CM

## 2021-11-26 DIAGNOSIS — G8929 Other chronic pain: Secondary | ICD-10-CM

## 2021-11-26 DIAGNOSIS — G5682 Other specified mononeuropathies of left upper limb: Secondary | ICD-10-CM | POA: Diagnosis not present

## 2021-11-26 DIAGNOSIS — M47816 Spondylosis without myelopathy or radiculopathy, lumbar region: Secondary | ICD-10-CM

## 2021-11-26 DIAGNOSIS — M19012 Primary osteoarthritis, left shoulder: Secondary | ICD-10-CM

## 2021-11-26 NOTE — Progress Notes (Signed)
Patient: Teresa [name]  Service Category: E/M  Provider: Gillis Santa, MD  DOB: Sep 30, 1956  DOS: 11/26/2021  Location: Office  MRN: [id number]  Setting: Ambulatory outpatient  Referring Provider: Tracie Harrier, MD  Type: Established Patient  Specialty: Interventional Pain Management  PCP: Teresa Harrier, MD  Location: Remote location  Delivery: TeleHealth     Virtual Encounter - Pain Management PROVIDER NOTE: Information contained herein reflects review and annotations entered in association with encounter. Interpretation of such information and data should be left to medically-trained personnel. Information provided to patient can be located elsewhere in the medical record under "Patient Instructions". Document created using STT-dictation technology, any transcriptional errors that may result from process are unintentional.    Contact & Pharmacy Preferred: [place]: [phone] (home) Mobile: [phone] (mobile) E-mail: [email or web address]  [id number] [place] [facility] [address] [place]: [phone]: [phone] [id number] [place] [address] [place]: [phone]: [phone] Pre-screening  Ms. AsGilliamffered "in-person" vs "virtual" encounter. She indicated preferring virtual for this encounter.   Reason COVID-19*   Social distancing based on CDC and AMA recommendations.   I contacted Teresa [name] 11/26/2021 via telephone.      I clearly identified myself as BiGillis SantaMD. I verified that I was speaking with the correct person using two identifiers (Name: Teresa RORRERand date of birth: 7/Aug 25, 1956  Consent I sought verbal advanced consent from Teresa [name] virtual visit interactions. I informed [name] possible security and privacy concerns, risks, and limitations associated with providing  "not-in-person" medical evaluation and management services. I also informed [name] the availability of "in-person" appointments. Finally, I informed her that there would be a charge for the virtual visit and that she could be  personally, fully or partially, financially responsible for it. [name] understanding and agreed to proceed.   Historic Elements   Ms. Teresa DOUGANs a [age].o. year old, female patient evaluated today after our last contact on 10/23/2021. [name] a past medical history of Dyspnea, Headache, History of fractured vertebra (1982), Hyperlipidemia, Low bone density, Melanoma (HCPort [place] and Transfusion of blood product refused for religious reason. She also  has a past surgical history that includes Abdominal hysterectomy; Colonoscopy (11/03/1998); Eye surgery (Bilateral, 11/03/2012); Artery Biopsy (Left, 12/18/2015); and melanoma excisison (Right). [name] a current medication list which includes the following prescription(s): acetaminophen, alendronate, amoxicillin-clavulanate, vitamin c, aspirin-acetaminophen-caffeine, calcium carb-cholecalciferol, vitamin d3, [facility] vitamin b12, dicyclomine, ferrous gluconate, gabapentin, inulin, magnesium, nortriptyline, oxycodone-acetaminophen, pantoprazole, tizanidine, topiramate, and zolpidem. She  reports that she has never smoked. She has never used smokeless tobacco. She reports that she does not drink alcohol and does not use drugs. [name] allergic to elastic bandages & [zinc] and morphine.   HPI  Today, she is being contacted for a post-procedure assessment.   Post-procedure evaluation  Type: Lumbar Facet, Medial Branch Block(s)  Primary Purpose: Therapeutic/palliative Region: Posterolateral Lumbosacral Spine Level: L3, L4, L5, Medial Branch Level(s). Injecting these levels blocks the L3-4, L4-5, lumbar facet joints. Laterality: Bilateral  Type: Therapeutic Glenohumeral Joint (shoulder) Injection #2  along with periscapular and thoracic and sacral trigger point injections for myofascial pain syndrome Primary Purpose: Diagnostic/Therapeutric Region: Superior Shoulder Area Level:  Shoulder Target Area: Glenohumeral Joint (shoulder) Approach: Posterior approach. Laterality:  Left-Sided  Effectiveness:  Initial hour after procedure: 100 %  Subsequent 4-6 hours post-procedure: 100 %  Analgesia past initial 6 hours: 80 % (Current)  Ongoing improvement:  Analgesic:  80% shoulder, 60% lower back Function: Teresa [name] reports improvement in function  Laboratory Chemistry Profile   Renal Lab Results  Component Value Date   BUN 11 08/14/2014   CREATININE 1.00 06/17/2021   GFRAA >90 08/14/2014   GFRNONAA 89 (L) 08/14/2014    Hepatic No results found for: AST, ALT, ALBUMIN, ALKPHOS, HCVAB, AMYLASE, LIPASE, AMMONIA  Electrolytes Lab Results  Component Value Date   NA 139 08/14/2014   K 4.0 08/14/2014   CL 99 08/14/2014   CALCIUM 10.2 08/14/2014    Bone No results found for: VD25OH, VD125OH2TOT, MC8022VV6, PQ2449PN3, 25OHVITD1, 25OHVITD2, 25OHVITD3, TESTOFREE, TESTOSTERONE  Inflammation (CRP: Acute Phase) (ESR: Chronic Phase) No results found for: CRP, ESRSEDRATE, LATICACIDVEN       Note: Above Lab results reviewed.  Assessment  The primary encounter diagnosis was Chronic left shoulder pain. Diagnoses of Lumbar facet arthropathy (L3/4 and L4/5), Lumbar spondylosis, Myofascial pain syndrome, Dysfunction of left rotator cuff, Arthritis of left glenohumeral joint, Neuropathy of left suprascapular nerve, and Chronic pain syndrome were also pertinent to this visit.  Plan of Care   Good relief from glenohumeral joint and lumbar facet medial branch nerve block.  80% pain relief in shoulder and approximately 60% pain relief lower back.  Patient is wondering how steroid injections impact her bone density in the context of her having osteopenia.  I informed her that we should limit steroid  exposures and that all treatment options are considered within the risk benefits frame considering the patient's overall history.  I recommend that she discuss repeating bone scan with her primary care provider which will help determine frequency of steroid injections. In the meantime continue with multimodal analgesics.  Patient will contact us when it is time for refill.   Follow-up plan:   Return if symptoms worsen or fail to improve.     Status post left L3, L4, L5, S1 RFA on 06/15/2019, R L3,4,5, S1 RFA on 06/29/2019, left posterior glenohumeral shoulder steroid injection 01/02/2020: Not effective.  Left L4 and Sprint peripheral nerve stimulation on 04/25/2020, removed 06/11/2020.  Left suprascapular nerve block 07/01/2021            Recent Visits Date Type Provider Dept  10/23/21 Procedure visit Teresa Santa, MD [facility]  10/21/21 Office Visit Teresa Santa, MD [facility]  10/02/21 Procedure visit Teresa Santa, MD [facility]  09/23/21 Office Visit Teresa Santa, MD [facility]  Showing recent visits within past 90 days and meeting all other requirements Today's Visits Date Type Provider Dept  11/26/21 Office Visit Teresa Santa, MD [facility]  Showing today's visits and meeting all other requirements Future Appointments No visits were found meeting these conditions. Showing future appointments within next 90 days and meeting all other requirements  I discussed the assessment and treatment plan with the patient. The patient was provided an opportunity to ask questions and all were answered. The patient agreed with the plan and demonstrated an understanding of the instructions.  Patient advised to call back or seek an in-person evaluation if the symptoms or condition worsens.  Duration of encounter: 3mnutes.  Note by: BGillis Santa MD Date: 11/26/2021; Time: 1:35 PM

## 2022-01-09 DIAGNOSIS — M810 Age-related osteoporosis without current pathological fracture: Secondary | ICD-10-CM | POA: Diagnosis not present

## 2022-02-28 DIAGNOSIS — E538 Deficiency of other specified B group vitamins: Secondary | ICD-10-CM | POA: Diagnosis not present

## 2022-02-28 DIAGNOSIS — D649 Anemia, unspecified: Secondary | ICD-10-CM | POA: Diagnosis not present

## 2022-02-28 DIAGNOSIS — M5136 Other intervertebral disc degeneration, lumbar region: Secondary | ICD-10-CM | POA: Diagnosis not present

## 2022-02-28 DIAGNOSIS — R0602 Shortness of breath: Secondary | ICD-10-CM | POA: Diagnosis not present

## 2022-02-28 DIAGNOSIS — I10 Essential (primary) hypertension: Secondary | ICD-10-CM | POA: Diagnosis not present

## 2022-02-28 DIAGNOSIS — M791 Myalgia, unspecified site: Secondary | ICD-10-CM | POA: Diagnosis not present

## 2022-02-28 DIAGNOSIS — E559 Vitamin D deficiency, unspecified: Secondary | ICD-10-CM | POA: Diagnosis not present

## 2022-02-28 DIAGNOSIS — R197 Diarrhea, unspecified: Secondary | ICD-10-CM | POA: Diagnosis not present

## 2022-02-28 DIAGNOSIS — K219 Gastro-esophageal reflux disease without esophagitis: Secondary | ICD-10-CM | POA: Diagnosis not present

## 2022-02-28 DIAGNOSIS — E782 Mixed hyperlipidemia: Secondary | ICD-10-CM | POA: Diagnosis not present

## 2022-03-14 DIAGNOSIS — F411 Generalized anxiety disorder: Secondary | ICD-10-CM | POA: Diagnosis not present

## 2022-03-14 DIAGNOSIS — E7800 Pure hypercholesterolemia, unspecified: Secondary | ICD-10-CM | POA: Diagnosis not present

## 2022-03-14 DIAGNOSIS — F5102 Adjustment insomnia: Secondary | ICD-10-CM | POA: Diagnosis not present

## 2022-03-14 DIAGNOSIS — I10 Essential (primary) hypertension: Secondary | ICD-10-CM | POA: Diagnosis not present

## 2022-03-14 DIAGNOSIS — F331 Major depressive disorder, recurrent, moderate: Secondary | ICD-10-CM | POA: Diagnosis not present

## 2022-03-14 DIAGNOSIS — K582 Mixed irritable bowel syndrome: Secondary | ICD-10-CM | POA: Diagnosis not present

## 2022-03-14 DIAGNOSIS — M818 Other osteoporosis without current pathological fracture: Secondary | ICD-10-CM | POA: Diagnosis not present

## 2022-03-14 DIAGNOSIS — Z1231 Encounter for screening mammogram for malignant neoplasm of breast: Secondary | ICD-10-CM | POA: Diagnosis not present

## 2022-03-14 DIAGNOSIS — M797 Fibromyalgia: Secondary | ICD-10-CM | POA: Diagnosis not present

## 2022-03-14 DIAGNOSIS — M7662 Achilles tendinitis, left leg: Secondary | ICD-10-CM | POA: Diagnosis not present

## 2022-03-14 DIAGNOSIS — Z0000 Encounter for general adult medical examination without abnormal findings: Secondary | ICD-10-CM | POA: Diagnosis not present

## 2022-03-25 ENCOUNTER — Other Ambulatory Visit: Payer: Self-pay | Admitting: Internal Medicine

## 2022-03-25 DIAGNOSIS — Z1231 Encounter for screening mammogram for malignant neoplasm of breast: Secondary | ICD-10-CM

## 2022-03-27 DIAGNOSIS — M6740 Ganglion, unspecified site: Secondary | ICD-10-CM | POA: Diagnosis not present

## 2022-03-27 DIAGNOSIS — M7662 Achilles tendinitis, left leg: Secondary | ICD-10-CM | POA: Diagnosis not present

## 2022-03-27 DIAGNOSIS — S86012A Strain of left Achilles tendon, initial encounter: Secondary | ICD-10-CM | POA: Diagnosis not present

## 2022-03-27 DIAGNOSIS — M7660 Achilles tendinitis, unspecified leg: Secondary | ICD-10-CM | POA: Diagnosis not present

## 2022-03-27 DIAGNOSIS — M67472 Ganglion, left ankle and foot: Secondary | ICD-10-CM | POA: Diagnosis not present

## 2022-04-08 DIAGNOSIS — H5213 Myopia, bilateral: Secondary | ICD-10-CM | POA: Diagnosis not present

## 2022-04-08 DIAGNOSIS — Z135 Encounter for screening for eye and ear disorders: Secondary | ICD-10-CM | POA: Diagnosis not present

## 2022-04-08 DIAGNOSIS — H524 Presbyopia: Secondary | ICD-10-CM | POA: Diagnosis not present

## 2022-04-08 DIAGNOSIS — Z961 Presence of intraocular lens: Secondary | ICD-10-CM | POA: Diagnosis not present

## 2022-04-08 DIAGNOSIS — H04123 Dry eye syndrome of bilateral lacrimal glands: Secondary | ICD-10-CM | POA: Diagnosis not present

## 2022-04-08 DIAGNOSIS — H52223 Regular astigmatism, bilateral: Secondary | ICD-10-CM | POA: Diagnosis not present

## 2022-04-22 ENCOUNTER — Ambulatory Visit
Admission: RE | Admit: 2022-04-22 | Discharge: 2022-04-22 | Disposition: A | Discharge status: Home / Self Care | Payer: No Typology Code available for payment source | Source: Ambulatory Visit | Attending: Internal Medicine | Admitting: Internal Medicine

## 2022-04-22 DIAGNOSIS — Z1231 Encounter for screening mammogram for malignant neoplasm of breast: Secondary | ICD-10-CM | POA: Diagnosis not present

## 2022-05-19 DIAGNOSIS — M25571 Pain in right ankle and joints of right foot: Secondary | ICD-10-CM | POA: Diagnosis not present

## 2022-05-19 DIAGNOSIS — W010XXA Fall on same level from slipping, tripping and stumbling without subsequent striking against object, initial encounter: Secondary | ICD-10-CM | POA: Diagnosis not present

## 2022-05-19 DIAGNOSIS — M7989 Other specified soft tissue disorders: Secondary | ICD-10-CM | POA: Diagnosis not present

## 2022-05-19 DIAGNOSIS — S80811A Abrasion, right lower leg, initial encounter: Secondary | ICD-10-CM | POA: Diagnosis not present

## 2022-05-19 DIAGNOSIS — Z23 Encounter for immunization: Secondary | ICD-10-CM | POA: Diagnosis not present

## 2022-05-19 DIAGNOSIS — L089 Local infection of the skin and subcutaneous tissue, unspecified: Secondary | ICD-10-CM | POA: Diagnosis not present

## 2022-05-19 DIAGNOSIS — F411 Generalized anxiety disorder: Secondary | ICD-10-CM | POA: Diagnosis not present

## 2022-05-19 DIAGNOSIS — E782 Mixed hyperlipidemia: Secondary | ICD-10-CM | POA: Diagnosis not present

## 2022-05-22 ENCOUNTER — Other Ambulatory Visit: Payer: Self-pay | Admitting: Internal Medicine

## 2022-05-22 ENCOUNTER — Other Ambulatory Visit: Payer: No Typology Code available for payment source

## 2022-05-22 DIAGNOSIS — M25571 Pain in right ankle and joints of right foot: Secondary | ICD-10-CM

## 2022-05-22 DIAGNOSIS — Z9181 History of falling: Secondary | ICD-10-CM

## 2022-05-22 DIAGNOSIS — M25471 Effusion, right ankle: Secondary | ICD-10-CM

## 2022-05-27 DIAGNOSIS — M25571 Pain in right ankle and joints of right foot: Secondary | ICD-10-CM | POA: Diagnosis not present

## 2022-05-27 DIAGNOSIS — S8264XA Nondisplaced fracture of lateral malleolus of right fibula, initial encounter for closed fracture: Secondary | ICD-10-CM | POA: Diagnosis not present

## 2022-05-27 DIAGNOSIS — M7918 Myalgia, other site: Secondary | ICD-10-CM | POA: Diagnosis not present

## 2022-05-27 DIAGNOSIS — K219 Gastro-esophageal reflux disease without esophagitis: Secondary | ICD-10-CM | POA: Diagnosis not present

## 2022-05-28 DIAGNOSIS — H5213 Myopia, bilateral: Secondary | ICD-10-CM | POA: Diagnosis not present

## 2022-05-28 DIAGNOSIS — Z135 Encounter for screening for eye and ear disorders: Secondary | ICD-10-CM | POA: Diagnosis not present

## 2022-05-28 DIAGNOSIS — H538 Other visual disturbances: Secondary | ICD-10-CM | POA: Diagnosis not present

## 2022-05-28 DIAGNOSIS — Z961 Presence of intraocular lens: Secondary | ICD-10-CM | POA: Diagnosis not present

## 2022-05-28 DIAGNOSIS — H04123 Dry eye syndrome of bilateral lacrimal glands: Secondary | ICD-10-CM | POA: Diagnosis not present

## 2022-05-28 DIAGNOSIS — H52223 Regular astigmatism, bilateral: Secondary | ICD-10-CM | POA: Diagnosis not present

## 2022-05-28 DIAGNOSIS — H524 Presbyopia: Secondary | ICD-10-CM | POA: Diagnosis not present

## 2022-06-17 DIAGNOSIS — M25571 Pain in right ankle and joints of right foot: Secondary | ICD-10-CM | POA: Diagnosis not present

## 2022-06-17 DIAGNOSIS — M79672 Pain in left foot: Secondary | ICD-10-CM | POA: Diagnosis not present

## 2022-06-17 DIAGNOSIS — S86012D Strain of left Achilles tendon, subsequent encounter: Secondary | ICD-10-CM | POA: Diagnosis not present

## 2022-07-01 ENCOUNTER — Ambulatory Visit
Payer: No Typology Code available for payment source | Admitting: Student in an Organized Health Care Education/Training Program

## 2022-07-13 ENCOUNTER — Encounter: Payer: Self-pay | Admitting: Emergency Medicine

## 2022-07-13 ENCOUNTER — Other Ambulatory Visit: Payer: Self-pay

## 2022-07-13 ENCOUNTER — Emergency Department: Payer: No Typology Code available for payment source

## 2022-07-13 ENCOUNTER — Observation Stay
Admission: EM | Admit: 2022-07-13 | Discharge: 2022-07-14 | Disposition: A | Discharge status: Home / Self Care | Payer: No Typology Code available for payment source | Attending: Internal Medicine | Admitting: Internal Medicine

## 2022-07-13 DIAGNOSIS — I10 Essential (primary) hypertension: Secondary | ICD-10-CM | POA: Diagnosis not present

## 2022-07-13 DIAGNOSIS — K529 Noninfective gastroenteritis and colitis, unspecified: Secondary | ICD-10-CM | POA: Diagnosis not present

## 2022-07-13 DIAGNOSIS — R6510 Systemic inflammatory response syndrome (SIRS) of non-infectious origin without acute organ dysfunction: Secondary | ICD-10-CM | POA: Insufficient documentation

## 2022-07-13 DIAGNOSIS — R000 Tachycardia, unspecified: Secondary | ICD-10-CM | POA: Diagnosis not present

## 2022-07-13 DIAGNOSIS — I959 Hypotension, unspecified: Secondary | ICD-10-CM | POA: Diagnosis not present

## 2022-07-13 DIAGNOSIS — D509 Iron deficiency anemia, unspecified: Secondary | ICD-10-CM | POA: Diagnosis not present

## 2022-07-13 DIAGNOSIS — R197 Diarrhea, unspecified: Secondary | ICD-10-CM | POA: Diagnosis not present

## 2022-07-13 DIAGNOSIS — R109 Unspecified abdominal pain: Secondary | ICD-10-CM | POA: Diagnosis not present

## 2022-07-13 DIAGNOSIS — F32A Depression, unspecified: Secondary | ICD-10-CM | POA: Diagnosis present

## 2022-07-13 DIAGNOSIS — R1084 Generalized abdominal pain: Secondary | ICD-10-CM | POA: Diagnosis present

## 2022-07-13 DIAGNOSIS — E876 Hypokalemia: Secondary | ICD-10-CM | POA: Insufficient documentation

## 2022-07-13 DIAGNOSIS — R748 Abnormal levels of other serum enzymes: Secondary | ICD-10-CM | POA: Diagnosis not present

## 2022-07-13 DIAGNOSIS — G43909 Migraine, unspecified, not intractable, without status migrainosus: Secondary | ICD-10-CM | POA: Diagnosis present

## 2022-07-13 DIAGNOSIS — N200 Calculus of kidney: Secondary | ICD-10-CM | POA: Diagnosis not present

## 2022-07-13 LAB — GASTROINTESTINAL PANEL BY PCR, STOOL (REPLACES STOOL CULTURE)

## 2022-07-13 LAB — URINALYSIS, ROUTINE W REFLEX MICROSCOPIC
Bilirubin Urine: NEGATIVE
Glucose, UA: NEGATIVE mg/dL
Hgb urine dipstick: NEGATIVE
Ketones, ur: NEGATIVE mg/dL
Leukocytes,Ua: NEGATIVE
Nitrite: NEGATIVE
Protein, ur: NEGATIVE mg/dL
Specific Gravity, Urine: 1.019 (ref 1.005–1.030)
pH: 5 (ref 5.0–8.0)

## 2022-07-13 LAB — MAGNESIUM: Magnesium: 2.1 mg/dL (ref 1.7–2.4)

## 2022-07-13 LAB — CBC
HCT: 42.7 % (ref 36.0–46.0)
Hemoglobin: 13.3 g/dL (ref 12.0–15.0)
MCH: 27.3 pg (ref 26.0–34.0)
MCHC: 31.1 g/dL (ref 30.0–36.0)
MCV: 87.7 fL (ref 80.0–100.0)
Platelets: 576 10*3/uL — ABNORMAL HIGH (ref 150–400)
RBC: 4.87 MIL/uL (ref 3.87–5.11)
RDW: 13.7 % (ref 11.5–15.5)
WBC: 20.7 10*3/uL — ABNORMAL HIGH (ref 4.0–10.5)
nRBC: 0 % (ref 0.0–0.2)

## 2022-07-13 LAB — PROCALCITONIN: Procalcitonin: <0.1 ng/mL

## 2022-07-13 LAB — COMPREHENSIVE METABOLIC PANEL
ALT: 16 U/L (ref 0–44)
AST: 14 U/L — ABNORMAL LOW (ref 15–41)
Albumin: 3.6 g/dL (ref 3.5–5.0)
Alkaline Phosphatase: 44 U/L (ref 38–126)
Anion gap: 8 (ref 5–15)
BUN: 16 mg/dL (ref 8–23)
CO2: 22 mmol/L (ref 22–32)
Calcium: 9 mg/dL (ref 8.9–10.3)
Chloride: 112 mmol/L — ABNORMAL HIGH (ref 98–111)
Creatinine, Ser: 0.8 mg/dL (ref 0.44–1.00)
GFR, Estimated: >60 mL/min (ref >60)
Glucose, Bld: 112 mg/dL — ABNORMAL HIGH (ref 70–99)
Potassium: 3.4 mmol/L — ABNORMAL LOW (ref 3.5–5.1)
Sodium: 142 mmol/L (ref 135–145)
Total Bilirubin: 0.2 mg/dL — ABNORMAL LOW (ref 0.3–1.2)
Total Protein: 7.2 g/dL (ref 6.5–8.1)

## 2022-07-13 LAB — C DIFFICILE QUICK SCREEN W PCR REFLEX
C Diff antigen: NEGATIVE
C Diff interpretation: NOT DETECTED
C Diff toxin: NEGATIVE

## 2022-07-13 LAB — LACTIC ACID, PLASMA: Lactic Acid, Venous: 1.1 mmol/L (ref 0.5–1.9)

## 2022-07-13 LAB — LIPASE, BLOOD: Lipase: 141 U/L — ABNORMAL HIGH (ref 11–51)

## 2022-07-13 MED ORDER — SODIUM CHLORIDE 0.9 % IV SOLN
INTRAVENOUS | Status: DC
  Administered 2022-07-13: 100 mL via INTRAVENOUS

## 2022-07-13 MED ORDER — FENTANYL CITRATE PF 50 MCG/ML IJ SOSY: 25.0000 ug | PREFILLED_SYRINGE | INTRAMUSCULAR | Status: DC | PRN

## 2022-07-13 MED ORDER — VITAMIN B-12 1000 MCG PO TABS: 1000.0000 ug | ORAL_TABLET | ORAL | Status: DC

## 2022-07-13 MED ORDER — SODIUM CHLORIDE 0.9 % IV BOLUS (SEPSIS)
1000.0000 mL | Freq: Once | INTRAVENOUS | Status: AC
  Administered 2022-07-13: 1000 mL via INTRAVENOUS

## 2022-07-13 MED ORDER — KETOROLAC TROMETHAMINE 30 MG/ML IJ SOLN
15.0000 mg | Freq: Once | INTRAMUSCULAR | Status: AC
Start: 2022-07-13 — End: 2022-07-13
  Administered 2022-07-13: 15 mg via INTRAVENOUS
  Filled 2022-07-13: qty 1

## 2022-07-13 MED ORDER — ENOXAPARIN SODIUM 40 MG/0.4ML IJ SOSY
40.0000 mg | PREFILLED_SYRINGE | INTRAMUSCULAR | Status: DC
Start: 2022-07-13 — End: 2022-07-14
  Filled 2022-07-13: qty 0.4

## 2022-07-13 MED ORDER — IOHEXOL 300 MG/ML  SOLN
100.0000 mL | Freq: Once | INTRAMUSCULAR | Status: AC | PRN
  Administered 2022-07-13: 100 mL via INTRAVENOUS

## 2022-07-13 MED ORDER — VITAMIN C 500 MG PO TABS
1000.0000 mg | ORAL_TABLET | Freq: Every day | ORAL | Status: DC
  Filled 2022-07-13: qty 2

## 2022-07-13 MED ORDER — SODIUM CHLORIDE 0.9 % IV SOLN
2.0000 g | Freq: Once | INTRAVENOUS | Status: AC
  Administered 2022-07-14: 2 g via INTRAVENOUS
  Filled 2022-07-13: qty 20

## 2022-07-13 MED ORDER — VITAMIN D3 25 MCG (1000 UNIT) PO TABS
5000.0000 [IU] | ORAL_TABLET | Freq: Every day | ORAL | Status: DC
  Filled 2022-07-13: qty 5

## 2022-07-13 MED ORDER — METRONIDAZOLE 500 MG/100ML IV SOLN
500.0000 mg | Freq: Once | INTRAVENOUS | Status: AC
Start: 2022-07-13 — End: 2022-07-13
  Administered 2022-07-13: 500 mg via INTRAVENOUS

## 2022-07-13 MED ORDER — TOPIRAMATE 100 MG PO TABS
100.0000 mg | ORAL_TABLET | Freq: Every day | ORAL | Status: DC
  Filled 2022-07-13: qty 1

## 2022-07-13 MED ORDER — GABAPENTIN 300 MG PO CAPS
600.0000 mg | ORAL_CAPSULE | Freq: Two times a day (BID) | ORAL | Status: DC
  Filled 2022-07-13: qty 2

## 2022-07-13 MED ORDER — PIPERACILLIN-TAZOBACTAM 3.375 G IVPB 30 MIN
3.3750 g | Freq: Once | INTRAVENOUS | Status: DC
  Administered 2022-07-13: 3.375 g via INTRAVENOUS
  Filled 2022-07-13: qty 50

## 2022-07-13 MED ORDER — OXYCODONE-ACETAMINOPHEN 5-325 MG PO TABS
1.0000 | ORAL_TABLET | ORAL | Status: DC | PRN
  Administered 2022-07-13: 1 via ORAL
  Filled 2022-07-13: qty 1

## 2022-07-13 MED ORDER — SODIUM CHLORIDE 0.9 % IV SOLN
2.0000 g | Freq: Once | INTRAVENOUS | Status: AC
  Administered 2022-07-13: 2 g via INTRAVENOUS
  Filled 2022-07-13: qty 20

## 2022-07-13 MED ORDER — ONDANSETRON HCL 4 MG/2ML IJ SOLN
4.0000 mg | Freq: Once | INTRAMUSCULAR | Status: AC
  Administered 2022-07-13: 4 mg via INTRAVENOUS
  Filled 2022-07-13: qty 2

## 2022-07-13 MED ORDER — ASPIRIN-ACETAMINOPHEN-CAFFEINE 250-250-65 MG PO TABS: 1.0000 | ORAL_TABLET | Freq: Three times a day (TID) | ORAL | Status: DC | PRN

## 2022-07-13 MED ORDER — ACETAMINOPHEN 500 MG PO TABS
500.0000 mg | ORAL_TABLET | Freq: Four times a day (QID) | ORAL | Status: DC | PRN
  Filled 2022-07-13: qty 1

## 2022-07-13 MED ORDER — MAGNESIUM OXIDE -MG SUPPLEMENT 400 (240 MG) MG PO TABS: 200.0000 mg | ORAL_TABLET | Freq: Every day | ORAL | Status: DC

## 2022-07-13 MED ORDER — FERROUS GLUCONATE 324 (38 FE) MG PO TABS
648.0000 mg | ORAL_TABLET | Freq: Every day | ORAL | Status: DC
  Filled 2022-07-13 (×2): qty 2

## 2022-07-13 MED ORDER — POTASSIUM CHLORIDE CRYS ER 20 MEQ PO TBCR
40.0000 meq | EXTENDED_RELEASE_TABLET | ORAL | Status: AC
  Filled 2022-07-13: qty 2

## 2022-07-13 MED ORDER — OYSTER SHELL CALCIUM/D3 500-5 MG-MCG PO TABS: 1.0000 | ORAL_TABLET | Freq: Every day | ORAL | Status: DC

## 2022-07-13 MED ORDER — METRONIDAZOLE 500 MG/100ML IV SOLN
500.0000 mg | Freq: Two times a day (BID) | INTRAVENOUS | Status: DC
  Administered 2022-07-13: 500 mg via INTRAVENOUS
  Filled 2022-07-13 (×2): qty 100

## 2022-07-13 MED ORDER — DICYCLOMINE HCL 20 MG PO TABS: 20.0000 mg | ORAL_TABLET | Freq: Four times a day (QID) | ORAL | Status: DC | PRN

## 2022-07-13 MED ORDER — NORTRIPTYLINE HCL 25 MG PO CAPS
50.0000 mg | ORAL_CAPSULE | Freq: Every day | ORAL | Status: DC
  Filled 2022-07-13 (×2): qty 2

## 2022-07-13 MED ORDER — ZOLPIDEM TARTRATE 5 MG PO TABS: 5.0000 mg | ORAL_TABLET | Freq: Every evening | ORAL | Status: DC | PRN

## 2022-07-13 MED ORDER — FENTANYL CITRATE PF 50 MCG/ML IJ SOSY
50.0000 ug | PREFILLED_SYRINGE | Freq: Once | INTRAMUSCULAR | Status: AC
  Administered 2022-07-13: 50 ug via INTRAVENOUS
  Filled 2022-07-13: qty 1

## 2022-07-13 MED ORDER — ONDANSETRON HCL 4 MG/2ML IJ SOLN: 4.0000 mg | Freq: Three times a day (TID) | INTRAMUSCULAR | Status: DC | PRN

## 2022-07-13 MED ORDER — ALPRAZOLAM 0.5 MG PO TABS
0.5000 mg | ORAL_TABLET | Freq: Every evening | ORAL | Status: DC | PRN
Start: 2022-07-13 — End: 2022-07-14

## 2022-07-13 MED ORDER — PANTOPRAZOLE SODIUM 40 MG PO TBEC
40.0000 mg | DELAYED_RELEASE_TABLET | Freq: Every day | ORAL | Status: DC
  Filled 2022-07-13: qty 1

## 2022-07-13 NOTE — H&P (Addendum)
History and Physical    Teresa [name] IRC:[id number] DOB: Dec 29, 1955 DOA: 07/13/2022  Referring MD/NP/PA:   PCP: Tracie Harrier, MD   Patient coming from:  The patient is coming from home.  At baseline, pt is independent for most of ADL.        Chief Complaint: Nausea, vomiting, diarrhea and abdominal pain  HPI: Teresa [name] is a 66 y.o. female with medical history significant of hypertension (patient denies this diagnosis), hyperlipidemia, GERD, depression, refused blood transfusion due to religious reason, migraine headache, melanoma, chronic pain, iron deficiency anemia, who presents with nausea, vomiting, diarrhea and abdominal pain.  Patient states that she has intermittent nausea, vomiting, diarrhea and abdominal pain for more than 2 weeks.  She states that sometimes she has 5-6 times of loose stool bowel movement per day.  She also reports several episodes of nonbilious nonbloody vomiting some days.  Her abdominal pain is diffuse, but worse on the right lower quadrant, mild to moderate, sharp, nonradiating.  Denies fever or chills.  Patient states that she took a course of Keflex in July for right ankle injury.  No chest pain, cough, shortness breath.  No symptoms of UTI  Data reviewed independently and ED Course: pt was found to have WBC 20.7, lactic acid 1.1, negative urinalysis, potassium 3.4, GFR> 60, liver function normal, temperature 97.5, blood pressure 95/43, heart rate 121, RR 26, oxygen saturation 96% on room air.  CT scan showed gastroenteritis. Patient is admitted to telemetry bed as inpatient.  EKG: I have personally reviewed.  Sinus rhythm, QTc 444, LAE, heart rate 116, LAD, poor R wave progression  CT of abdomen/pelvis: 1. Diffuse fluid-filled bowel with prominence of mesenteric lymph nodes, question gastroenteritis. 2. Small right renal calculus. 3. Pancreas: Unremarkable.  Review of Systems:   General: no fevers, chills, no body weight gain, has poor  appetite, has fatigue HEENT: no blurry vision, hearing changes or sore throat Respiratory: no dyspnea, coughing, wheezing CV: no chest pain, no palpitations GI: has nausea, vomiting, abdominal pain, diarrhea, no constipation GU: no dysuria, burning on urination, increased urinary frequency, hematuria  Ext: no leg edema Neuro: no unilateral weakness, numbness, or tingling, no vision change or hearing loss Skin: no rash, no skin tear. MSK: No muscle spasm, no deformity, no limitation of range of movement in spin Heme: No easy bruising.  Travel history: No recent long distant travel.   Allergy:  Allergies  Allergen Reactions   Elastic Bandages & [Zinc] Rash   Morphine Rash     Pt thinks she had rash after extended use    Past Medical History:  Diagnosis Date   Dyspnea    Headache    migraines   History of fractured vertebra 1982   3x   Hyperlipidemia    Low bone density    Melanoma (HCC)    Resected from Right foot.    Transfusion of blood product refused for religious reason     Past Surgical History:  Procedure Laterality Date   ABDOMINAL HYSTERECTOMY     ARTERY BIOPSY Left 12/18/2015   Procedure: BIOPSY TEMPORAL ARTERY;  Surgeon: Clyde Canterbury, MD;  Location: [facility];  Service: ENT;  Laterality: Left;   COLONOSCOPY  11/03/1998   EYE SURGERY Bilateral 11/03/2012   Cataracts   melanoma excisison Right    1978    Social History:  reports that she has never smoked. She has never used smokeless tobacco. She reports that she does not drink alcohol and  does not use drugs.  Family History:  Family History  Problem Relation Age of Onset   Heart attack Father    Dementia Mother      Prior to Admission medications   Medication Sig Start Date End Date Taking? Authorizing Provider  acetaminophen (TYLENOL) 500 MG tablet Take 500-1,000 mg by mouth every 6 (six) hours as needed (pain.).    [provider]  alendronate (FOSAMAX) 70 MG tablet Take 1  tablet (70 mg total) by mouth every Wednesday. Take with a full glass of water on an empty stomach. 06/05/21   Lysle Pearl, Isami, DO  ALPRAZolam Duanne Moron) 0.5 MG tablet Take 0.5 mg by mouth at bedtime as needed. 03/14/22   [provider]  amoxicillin-clavulanate (AUGMENTIN) 875-125 MG tablet Take 1 tablet by mouth 2 (two) times daily. 05/27/21   [provider]  Ascorbic Acid (VITAMIN C) 1000 MG tablet Take 1,000 mg by mouth daily before lunch.    [provider]  aspirin-acetaminophen-caffeine (EXCEDRIN MIGRAINE) [phone] MG tablet Take 1 tablet by mouth 3 (three) times daily as needed for headache.    [provider]  Calcium Carb-Cholecalciferol (CALTRATE 600+D3 PO) Take 1 tablet by mouth daily with breakfast.    [provider]  Cholecalciferol (VITAMIN D3) 125 MCG (5000 UT) TABS Take 5,000 Units by mouth daily with breakfast.    [provider]  [facility]S VITAMIN B12 1000 MCG tablet Take 1,000 mcg by mouth every other day. In the morning 04/10/21   [provider]  dicyclomine (BENTYL) 20 MG tablet Take 20 mg by mouth 4 (four) times daily as needed (bloating/indigestion.). 05/23/21   [provider]  ferrous gluconate (FERGON) 324 MG tablet Take 648 mg by mouth daily before lunch.    [provider]  gabapentin (NEURONTIN) 300 MG capsule Take 600 mg by mouth 2 (two) times daily. With breakfast & with lunch 05/17/21   [provider]  Inulin (FIBER CHOICE PO) Take 1 tablet by mouth at bedtime. Health Plus Super Colon Cleanse    [provider]  Magnesium 250 MG TABS Take 250 mg by mouth daily with breakfast.    [provider]  nortriptyline (PAMELOR) 10 MG capsule Take 50 mg by mouth daily with lunch. 05/08/21   [provider]  oxyCODONE-acetaminophen (PERCOCET/ROXICET) 5-325 MG tablet Take 1 tablet by mouth 2 (two) times daily as needed for severe pain. 06/25/21   Gillis Santa, MD  pantoprazole  (PROTONIX) 40 MG tablet Take by mouth. 06/17/21 06/17/22  [provider]  topiramate (TOPAMAX) 100 MG tablet Take 100 mg by mouth at bedtime. 01/26/20   [provider]  zolpidem (AMBIEN CR) 12.5 MG CR tablet Take 1 tablet by mouth at bedtime as needed. 06/27/22   [provider]  zolpidem (AMBIEN) 10 MG tablet Take 0.5 tablets (5 mg total) by mouth at bedtime as needed and may repeat dose one time if needed for sleep. 05/30/21   Benjamine Sprague, DO    Physical Exam: Vitals:   07/13/22 0803 07/13/22 0828 07/13/22 1123 07/13/22 1341  BP: (!) 104/55  117/65 116/80  Pulse: 97 (!) 101 75 91  Resp: 17 11 (!) 22 18  Temp:  97.7 F (36.5 C) 97.7 F (36.5 C) 97.7 F (36.5 C)  TempSrc:  Oral Oral Oral  SpO2: 100% 99% 98% 100%  Weight:  59.5 kg    Height:  '5\' 6"'$  (1.676 m)     General: Not in acute distress HEENT:  Eyes: PERRL, EOMI, no scleral icterus.       ENT: No discharge from the ears and nose, no pharynx injection, no tonsillar enlargement.        Neck: No JVD, no bruit, no mass felt. Heme: No neck lymph node enlargement. Cardiac: S1/S2, RRR, No murmurs, No gallops or rubs. Respiratory: No rales, wheezing, rhonchi or rubs. GI: Soft, nondistended, has mild tenderness in RLQ, no rebound pain, no organomegaly, BS present. GU: No hematuria Ext: No pitting leg edema bilaterally. 1+DP/PT pulse bilaterally. Musculoskeletal: No joint deformities, No joint redness or warmth, no limitation of ROM in spin. Skin: No rashes.  Neuro: Alert, oriented X3, cranial nerves II-XII grossly intact, moves all extremities normally. Psych: Patient is not psychotic, no suicidal or hemocidal ideation.  Labs on Admission: I have personally reviewed following labs and imaging studies  CBC: Recent Labs  Lab 07/13/22 0422  WBC 20.7*  HGB 13.3  HCT 42.7  MCV 87.7  PLT 578*   Basic Metabolic Panel: Recent Labs  Lab 07/13/22 0422  NA 142  K 3.4*  CL 112*  CO2 22  GLUCOSE  112*  BUN 16  CREATININE 0.80  CALCIUM 9.0  MG 2.1   GFR: Estimated Creatinine Clearance: 63 mL/min (by C-G formula based on SCr of 0.8 mg/dL). Liver Function Tests: Recent Labs  Lab 07/13/22 0422  AST 14*  ALT 16  ALKPHOS 44  BILITOT 0.2*  PROT 7.2  ALBUMIN 3.6   Recent Labs  Lab 07/13/22 0422  LIPASE 141*   No results for input(s): "AMMONIA" in the last 168 hours. Coagulation Profile: No results for input(s): "INR", "PROTIME" in the last 168 hours. Cardiac Enzymes: No results for input(s): "CKTOTAL", "CKMB", "CKMBINDEX", "TROPONINI" in the last 168 hours. BNP (last 3 results) No results for input(s): "PROBNP" in the last 8760 hours. HbA1C: No results for input(s): "HGBA1C" in the last 72 hours. CBG: No results for input(s): "GLUCAP" in the last 168 hours. Lipid Profile: No results for input(s): "CHOL", "HDL", "LDLCALC", "TRIG", "CHOLHDL", "LDLDIRECT" in the last 72 hours. Thyroid Function Tests: No results for input(s): "TSH", "T4TOTAL", "FREET4", "T3FREE", "THYROIDAB" in the last 72 hours. Anemia Panel: No results for input(s): "VITAMINB12", "FOLATE", "FERRITIN", "TIBC", "IRON", "RETICCTPCT" in the last 72 hours. Urine analysis:    Component Value Date/Time   COLORURINE YELLOW (A) 07/13/2022 0422   APPEARANCEUR HAZY (A) 07/13/2022 0422   LABSPEC 1.019 07/13/2022 0422   PHURINE 5.0 07/13/2022 0422   GLUCOSEU NEGATIVE 07/13/2022 0422   HGBUR NEGATIVE 07/13/2022 0422   BILIRUBINUR NEGATIVE 07/13/2022 0422   KETONESUR NEGATIVE 07/13/2022 0422   PROTEINUR NEGATIVE 07/13/2022 0422   NITRITE NEGATIVE 07/13/2022 0422   LEUKOCYTESUR NEGATIVE 07/13/2022 0422   Sepsis Labs: '@LABRCNTIP'$ (procalcitonin:4,lacticidven:4) )No results found for this or any previous visit (from the past 240 hour(s)).   Radiological Exams on Admission: CT ABDOMEN PELVIS W CONTRAST  Result Date: 07/13/2022 CLINICAL DATA:  Acute and nonlocalized abdominal pain EXAM: CT ABDOMEN AND PELVIS  WITH CONTRAST TECHNIQUE: Multidetector CT imaging of the abdomen and pelvis was performed using the standard protocol following bolus administration of intravenous contrast. RADIATION DOSE REDUCTION: This exam was performed according to the departmental dose-optimization program which includes automated exposure control, adjustment of the mA and/or kV according to patient size and/or use of iterative reconstruction technique. CONTRAST:  128m OMNIPAQUE IOHEXOL 300 MG/ML  SOLN COMPARISON:  06/17/2021 FINDINGS: Lower chest:  Mild scarring or atelectasis at the lung bases. Hepatobiliary: No focal liver abnormality.Cholecystectomy. Pancreas:  Unremarkable. Spleen: Unremarkable. Adrenals/Urinary Tract: Negative adrenals. No hydronephrosis or ureteral stone. 2 mm stone at the lower pole right kidney. Unremarkable bladder. Stomach/Bowel: Diffuse fluid filling of small and large bowel, moderately affecting the stomach. No obstructive pattern. Distally there is formed stool. Small scattered ingested high-density structures which may be pills. Vascular/Lymphatic: No acute vascular abnormality. Prominence of mesenteric lymph nodes diffusely, likely reactive to the bowel findings. Reproductive:Hysterectomy. Other: No ascites or pneumoperitoneum. Musculoskeletal: No acute abnormalities. Generalized lumbar spine degeneration and osteopenia. IMPRESSION: 1. Diffuse fluid-filled bowel with prominence of mesenteric lymph nodes, question gastroenteritis. 2. Small right renal calculus. Electronically Signed   By: Jorje Guild M.D.   On: 07/13/2022 07:30      Assessment/Plan Principal Problem:   Gastroenteritis Active Problems:   SIRS (systemic inflammatory response syndrome) (HCC)   Migraine   Iron deficiency anemia   Hypokalemia   Depression   Elevated lipase   Assessment and Plan: * Gastroenteritis Gastroenteritis and SIRS: Patient meets criteria for SIRS with WBC 20.7, heart rate 121, RR 26.  Lactic acid 1.1.   Blood pressure resolved.  Since no clear infectious etiology is identified so far, will treat patient as SIRS.  If infectious etiology is identified, will change to sepsis.  -will admitted to telemetry bed as inpatient -Patient received Rocephin and Flagyl in ED --> will continue both -Blood culture - Follow-up C. difficile and GI pathogen panel -IV fluid: 2 L normal saline, followed by 100 cc/h -Check procalcitonin level    SIRS (systemic inflammatory response syndrome) ([place]) - See above  Migraine - As needed Excedrin -Continue Topamax  Hypokalemia Potassium 3.4.  Magnesium 2.1 -Repleted potassium  Iron deficiency anemia Hemoglobin stable, 13.3 -Continue iron supplement  Depression - Continue home medications  Elevated lipase Lipase 141, but CT scan of abdomen showed no signs of pancreatitis. -Will not work-up for pancreatitis.          DVT ppx: SQ Lovenox  Code Status: Full code  Family Communication: her husband is at bed side.       Disposition Plan:  Anticipate discharge back to previous environment  Consults called:  none  Admission status and Level of care: Telemetry Medical:    as inpt     Dispo: The patient is from: Home              Anticipated d/c is to: Home              Anticipated d/c date is: 2 days              Patient currently is not medically stable to d/c.    Severity of Illness:  The appropriate patient status for this patient is INPATIENT. Inpatient status is judged to be reasonable and necessary in order to provide the required intensity of service to ensure the patient's safety. The patient's presenting symptoms, physical exam findings, and initial radiographic and laboratory data in the context of their chronic comorbidities is felt to place them at high risk for further clinical deterioration. Furthermore, it is not anticipated that the patient will be medically stable for discharge from the hospital within 2 midnights of  admission.   * I certify that at the point of admission it is my clinical judgment that the patient will require inpatient hospital care spanning beyond 2 midnights from the point of admission due to high intensity of service, high risk for further deterioration and high frequency of surveillance required.*  Date of Service 07/13/2022    Ivor Costa [facility]   If 7PM-7AM, please contact night-coverage [email or web address] 07/13/2022, 1:52 PM

## 2022-07-13 NOTE — ED Triage Notes (Signed)
Pt to ED via POV, reports severe RLQ abd pain. Pt also c/o N/V. Pt also c/o generalized abd pain and bloating.

## 2022-07-13 NOTE — Assessment & Plan Note (Signed)
-   As needed Excedrin -Continue Topamax

## 2022-07-13 NOTE — Assessment & Plan Note (Signed)
-   Continue home medications 

## 2022-07-13 NOTE — ED Provider Notes (Signed)
[facility] Provider Note    Event Date/Time   First MD Initiated Contact with Patient 07/13/22 [phone]     (approximate)   History   Abdominal Pain   HPI  Teresa [name] is a 66 y.o. female history of migraines, upper lipidemia who presents to the emergency department with complaints of generalized abdominal pain worse in the right abdomen.  History of hysterectomy, cholecystectomy last year.  Reports over the past 2 weeks she has had intermittent nausea, vomiting and diarrhea.  She did take antibiotics in July after she had an injury to her leg.  She does not recall the name of her antibiotics but according to care everywhere she was prescribed Keflex 500 mg 3 times a day for 10 days on 05/19/2022.  No sick contacts or recent travel.  No dysuria, hematuria, vaginal bleeding or discharge.   History provided by patient and husband.    Past Medical History:  Diagnosis Date   Dyspnea    Headache    migraines   History of fractured vertebra 1982   3x   Hyperlipidemia    Low bone density    Melanoma (HCC)    Resected from Right foot.    Transfusion of blood product refused for religious reason     Past Surgical History:  Procedure Laterality Date   ABDOMINAL HYSTERECTOMY     ARTERY BIOPSY Left 12/18/2015   Procedure: BIOPSY TEMPORAL ARTERY;  Surgeon: Clyde Canterbury, MD;  Location: [place];  Service: ENT;  Laterality: Left;   COLONOSCOPY  11/03/1998   EYE SURGERY Bilateral 11/03/2012   Cataracts   melanoma excisison Right    1978    MEDICATIONS:  Prior to Admission medications   Medication Sig Start Date End Date Taking? Authorizing Provider  acetaminophen (TYLENOL) 500 MG tablet Take 500-1,000 mg by mouth every 6 (six) hours as needed (pain.).    [provider]  alendronate (FOSAMAX) 70 MG tablet Take 1 tablet (70 mg total) by mouth every Wednesday. Take with a full glass of water on an empty stomach. 06/05/21   Lysle Pearl, Isami, DO   amoxicillin-clavulanate (AUGMENTIN) 875-125 MG tablet Take 1 tablet by mouth 2 (two) times daily. 05/27/21   [provider]  Ascorbic Acid (VITAMIN C) 1000 MG tablet Take 1,000 mg by mouth daily before lunch.    [provider]  aspirin-acetaminophen-caffeine (EXCEDRIN MIGRAINE) [phone] MG tablet Take 1 tablet by mouth 3 (three) times daily as needed for headache.    [provider]  Calcium Carb-Cholecalciferol (CALTRATE 600+D3 PO) Take 1 tablet by mouth daily with breakfast.    [provider]  Cholecalciferol (VITAMIN D3) 125 MCG (5000 UT) TABS Take 5,000 Units by mouth daily with breakfast.    [provider]  [employer] VITAMIN B12 1000 MCG tablet Take 1,000 mcg by mouth every other day. In the morning 04/10/21   [provider]  dicyclomine (BENTYL) 20 MG tablet Take 20 mg by mouth 4 (four) times daily as needed (bloating/indigestion.). 05/23/21   [provider]  ferrous gluconate (FERGON) 324 MG tablet Take 648 mg by mouth daily before lunch.    [provider]  gabapentin (NEURONTIN) 300 MG capsule Take 600 mg by mouth 2 (two) times daily. With breakfast & with lunch 05/17/21   [provider]  Inulin (FIBER CHOICE PO) Take 1 tablet by mouth at bedtime. Health Plus Super Colon Cleanse    [provider]  Magnesium 250 MG  TABS Take 250 mg by mouth daily with breakfast.    [provider]  nortriptyline (PAMELOR) 10 MG capsule Take 50 mg by mouth daily with lunch. 05/08/21   [provider]  oxyCODONE-acetaminophen (PERCOCET/ROXICET) 5-325 MG tablet Take 1 tablet by mouth 2 (two) times daily as needed for severe pain. 06/25/21   Gillis Santa, MD  pantoprazole (PROTONIX) 40 MG tablet Take by mouth. 06/17/21 06/17/22  [provider]  topiramate (TOPAMAX) 100 MG tablet Take 100 mg by mouth at bedtime. 01/26/20   [provider]  zolpidem (AMBIEN) 10 MG tablet Take 0.5 tablets (5 mg  total) by mouth at bedtime as needed and may repeat dose one time if needed for sleep. 05/30/21   Benjamine Sprague, DO    Physical Exam   Triage Vital Signs: ED Triage Vitals  Enc Vitals Group     BP 07/13/22 0417 116/65     Pulse Rate 07/13/22 0417 (!) 121     Resp 07/13/22 0417 (!) 22     Temp 07/13/22 0417 (!) 97.5 F (36.4 C)     Temp Source 07/13/22 0417 Oral     SpO2 07/13/22 0417 97 %     Weight --      Height --      Head Circumference --      Peak Flow --      Pain Score 07/13/22 0413 8     Pain Loc --      Pain Edu? --      Excl. in [place]? --     Most recent vital signs: Vitals:   07/13/22 0645 07/13/22 0803  BP: (!) 95/43 (!) 104/55  Pulse: (!) 102 97  Resp: 13 17  Temp:    SpO2: 96% 100%    CONSTITUTIONAL: Alert and oriented and responds appropriately to questions.  Appears uncomfortable but nontoxic HEAD: Normocephalic, atraumatic EYES: Conjunctivae clear, pupils appear equal, sclera nonicteric ENT: normal nose; moist mucous membranes NECK: Supple, normal ROM CARD: Regular and tachycardic; S1 and S2 appreciated; no murmurs, no clicks, no rubs, no gallops RESP: Normal chest excursion without splinting or tachypnea; breath sounds clear and equal bilaterally; no wheezes, no rhonchi, no rales, no hypoxia or respiratory distress, speaking full sentences ABD/GI: Normal bowel sounds; non-distended; soft, diffusely tender throughout the abdomen with mild intermittent guarding, no rebound BACK: The back appears normal EXT: Normal ROM in all joints; no deformity noted, no edema; no cyanosis SKIN: Normal color for age and race; warm; no rash on exposed skin NEURO: Moves all extremities equally, normal speech PSYCH: The patient's mood and manner are appropriate.   ED Results / Procedures / Treatments   LABS: (all labs ordered are listed, but only abnormal results are displayed) Labs Reviewed  LIPASE, BLOOD - Abnormal; Notable for the following components:      Result  Value   Lipase 141 (*)    All other components within normal limits  COMPREHENSIVE METABOLIC PANEL - Abnormal; Notable for the following components:   Potassium 3.4 (*)    Chloride 112 (*)    Glucose, Bld 112 (*)    AST 14 (*)    Total Bilirubin 0.2 (*)    All other components within normal limits  CBC - Abnormal; Notable for the following components:   WBC 20.7 (*)    Platelets 576 (*)    All other components within normal limits  URINALYSIS, ROUTINE W REFLEX MICROSCOPIC - Abnormal; Notable for the following components:  Color, Urine YELLOW (*)    APPearance HAZY (*)    All other components within normal limits  C DIFFICILE QUICK SCREEN W PCR REFLEX    GASTROINTESTINAL PANEL BY PCR, STOOL (REPLACES STOOL CULTURE)  CULTURE, BLOOD (ROUTINE X 2)  CULTURE, BLOOD (ROUTINE X 2)  MAGNESIUM  LACTIC ACID, PLASMA  PROCALCITONIN     EKG:  EKG Interpretation  Date/Time:  Sunday July 13 2022 04:22:07 EDT Ventricular Rate:  116 PR Interval:  126 QRS Duration: 70 QT Interval:  320 QTC Calculation: 444 R Axis:   -23 Text Interpretation: Sinus tachycardia Possible Anterolateral infarct , age undetermined Abnormal ECG No previous ECGs available Confirmed by Pryor Curia [phone]) on 07/13/2022 5:51:10 AM         RADIOLOGY: My personal review and interpretation of imaging: CT scan shows acute diarrheal illness.  I have personally reviewed all radiology reports.   CT ABDOMEN PELVIS W CONTRAST  Result Date: 07/13/2022 CLINICAL DATA:  Acute and nonlocalized abdominal pain EXAM: CT ABDOMEN AND PELVIS WITH CONTRAST TECHNIQUE: Multidetector CT imaging of the abdomen and pelvis was performed using the standard protocol following bolus administration of intravenous contrast. RADIATION DOSE REDUCTION: This exam was performed according to the departmental dose-optimization program which includes automated exposure control, adjustment of the mA and/or kV according to patient size and/or use  of iterative reconstruction technique. CONTRAST:  122m OMNIPAQUE IOHEXOL 300 MG/ML  SOLN COMPARISON:  06/17/2021 FINDINGS: Lower chest:  Mild scarring or atelectasis at the lung bases. Hepatobiliary: No focal liver abnormality.Cholecystectomy. Pancreas: Unremarkable. Spleen: Unremarkable. Adrenals/Urinary Tract: Negative adrenals. No hydronephrosis or ureteral stone. 2 mm stone at the lower pole right kidney. Unremarkable bladder. Stomach/Bowel: Diffuse fluid filling of small and large bowel, moderately affecting the stomach. No obstructive pattern. Distally there is formed stool. Small scattered ingested high-density structures which may be pills. Vascular/Lymphatic: No acute vascular abnormality. Prominence of mesenteric lymph nodes diffusely, likely reactive to the bowel findings. Reproductive:Hysterectomy. Other: No ascites or pneumoperitoneum. Musculoskeletal: No acute abnormalities. Generalized lumbar spine degeneration and osteopenia. IMPRESSION: 1. Diffuse fluid-filled bowel with prominence of mesenteric lymph nodes, question gastroenteritis. 2. Small right renal calculus. Electronically Signed   By: JJorje GuildM.D.   On: 07/13/2022 07:30     PROCEDURES:  Critical Care performed: No      .1-3 Lead EKG Interpretation  Performed by: Jomari Bartnik, KDelice Bison DO Authorized by: Diyana Starrett, KDelice Bison DO     Interpretation: abnormal     ECG rate:  121   ECG rate assessment: tachycardic     Rhythm: sinus tachycardia     Ectopy: none     Conduction: normal       IMPRESSION / MDM / ASSESSMENT AND PLAN / ED COURSE  I reviewed the triage vital signs and the nursing notes.    Patient here with complaints of abdominal pain, vomiting and diarrhea.  The patient is on the cardiac monitor to evaluate for evidence of arrhythmia and/or significant heart rate changes.   DIFFERENTIAL DIAGNOSIS (includes but not limited to):   Colitis, diverticulitis, appendicitis, pancreatitis, gastritis, C. difficile,  infectious diarrhea, gastroenteritis   Patient's presentation is most consistent with acute presentation with potential threat to life or bodily function.   PLAN: We will obtain CBC, CMP, lipase, urinalysis, CT abdomen pelvis.  Will give IV fluids, pain and nausea medicine.   MEDICATIONS GIVEN IN ED: Medications  sodium chloride 0.9 % bolus 1,000 mL (has no administration in time range)  piperacillin-tazobactam (ZOSYN) IVPB  3.375 g (has no administration in time range)  ketorolac (TORADOL) 30 MG/ML injection 15 mg (has no administration in time range)  ondansetron (ZOFRAN) injection 4 mg (has no administration in time range)  fentaNYL (SUBLIMAZE) injection 25 mcg (has no administration in time range)  sodium chloride 0.9 % bolus 1,000 mL (1,000 mLs Intravenous New Bag/Given 07/13/22 0639)  fentaNYL (SUBLIMAZE) injection 50 mcg (50 mcg Intravenous Given 07/13/22 0640)  ondansetron (ZOFRAN) injection 4 mg (4 mg Intravenous Given 07/13/22 0640)  iohexol (OMNIPAQUE) 300 MG/ML solution 100 mL (100 mLs Intravenous Contrast Given 07/13/22 0721)     ED COURSE: Patient's labs show a leukocytosis of 20,000.  Normal electrolytes other than slightly low potassium of 3.4.  Lipase elevated at 141.  Lactic normal.  Urine shows no sign of infection.  CT abdomen pelvis reviewed and interpreted by myself and the radiologist and shows diffuse fluid-filled bowel with prominence of mesenteric lymph nodes suggestive of gastroenteritis.  No other acute abnormality.  Her heart rate has improved from the 120s down into the low 100s with 1 L of fluid.  Blood pressure however has dropped.  Will give second liter of fluid.  She meets SIRS criteria currently with tachycardia, tachypnea, hypotension, leukocytosis.  Will give IV Zosyn.  Stool culture is still pending.   On reevaluation, blood pressure has improved into the low 100s and heart rate in the 90s.   CONSULTS:  Consulted and discussed patient's case with  hospitalist, Dr. Blaine Hamper.  I have recommended admission and consulting physician agrees and will place admission orders.  Patient (and family if present) agree with this plan.   I reviewed all nursing notes, vitals, pertinent previous records.  All labs, EKGs, imaging ordered have been independently reviewed and interpreted by myself.    OUTSIDE RECORDS REVIEWED: Reviewed patient's last medicine note and podiatry notes in July 2023.       FINAL CLINICAL IMPRESSION(S) / ED DIAGNOSES   Final diagnoses:  Vomiting and diarrhea  SIRS (systemic inflammatory response syndrome) (HCC)  Hypotension, unspecified hypotension type     Rx / DC Orders   ED Discharge Orders     None        Note:  This document was prepared using Dragon voice recognition software and may include unintentional dictation errors.   Chanel Mcadams, Delice Bison, DO 07/13/22 [phone]

## 2022-07-13 NOTE — Assessment & Plan Note (Signed)
Potassium 3.4.  Magnesium 2.1 -Repleted potassium

## 2022-07-13 NOTE — Progress Notes (Signed)
Patient refused Lovenox injection; Dr.Mansy notified of RED MED Refusal; acknowledged and instructed to order SCDs and chart.  Patient refused SCDs d/t hx of "torn achilles tendon on one leg and fell on the other, besides I'm getting up and down to the bathroom".  Noted.Barbaraann Faster, RN 11:03 PM ;07/13/2022

## 2022-07-13 NOTE — ED Notes (Signed)
Report from Bell, RN  Pt not happy with care received from night shift RN and states he was not helping her and she wants to leave now and refusing IVF at this time due "not being told anything". MD asked to please speak to pt

## 2022-07-13 NOTE — ED Notes (Signed)
Pt states she is uncomfortable in the stretcher, pt offered a recliner and refused. Pt asked to walk around, pt ambulatory with steady gait. Husband with pt.

## 2022-07-13 NOTE — ED Notes (Signed)
Pt has concerns of only getting IV ABX daily and does not think that is enough and is concerned this hospital stay is not worth once a day ABX.   Pt's plan of care discussed and why they wanted to admit, pt does state she will stay now.  Pt refuses all of her daily medications that will be given here, pt denies any more blood draws at this time.

## 2022-07-13 NOTE — Assessment & Plan Note (Signed)
See above

## 2022-07-13 NOTE — ED Notes (Signed)
After this RN started Zosyn, EDP asked to stop due to hospitalitis of ABX preference.

## 2022-07-13 NOTE — ED Notes (Signed)
MD at bedside.   Pt agreeable to to stay and be admitted. Pt requests to take all home meds while admitted. Pt is also agreeable to IVF.

## 2022-07-13 NOTE — Assessment & Plan Note (Signed)
Lipase 141, but CT scan of abdomen showed no signs of pancreatitis. -Will not work-up for pancreatitis.

## 2022-07-13 NOTE — Assessment & Plan Note (Signed)
Hemoglobin stable, 13.3 -Continue iron supplement

## 2022-07-13 NOTE — Assessment & Plan Note (Addendum)
Gastroenteritis and SIRS: Patient meets criteria for SIRS with WBC 20.7, heart rate 121, RR 26.  Lactic acid 1.1.  Blood pressure resolved.  Since no clear infectious etiology is identified so far, will treat patient as SIRS.  If infectious etiology is identified, will change to sepsis.  -will admitted to telemetry bed as inpatient -Patient received Rocephin and Flagyl in ED --> will continue both -Blood culture - Follow-up C. difficile and GI pathogen panel -IV fluid: 2 L normal saline, followed by 100 cc/h -Check procalcitonin level

## 2022-07-14 DIAGNOSIS — K529 Noninfective gastroenteritis and colitis, unspecified: Secondary | ICD-10-CM | POA: Diagnosis not present

## 2022-07-14 LAB — BASIC METABOLIC PANEL
Anion gap: 4 — ABNORMAL LOW (ref 5–15)
BUN: 10 mg/dL (ref 8–23)
CO2: 20 mmol/L — ABNORMAL LOW (ref 22–32)
Calcium: 8 mg/dL — ABNORMAL LOW (ref 8.9–10.3)
Chloride: 120 mmol/L — ABNORMAL HIGH (ref 98–111)
Creatinine, Ser: 0.7 mg/dL (ref 0.44–1.00)
GFR, Estimated: >60 mL/min (ref >60)
Glucose, Bld: 100 mg/dL — ABNORMAL HIGH (ref 70–99)
Potassium: 3.5 mmol/L (ref 3.5–5.1)
Sodium: 144 mmol/L (ref 135–145)

## 2022-07-14 LAB — CBC
HCT: 33.2 % — ABNORMAL LOW (ref 36.0–46.0)
Hemoglobin: 10.5 g/dL — ABNORMAL LOW (ref 12.0–15.0)
MCH: 27.3 pg (ref 26.0–34.0)
MCHC: 31.6 g/dL (ref 30.0–36.0)
MCV: 86.2 fL (ref 80.0–100.0)
Platelets: 469 10*3/uL — ABNORMAL HIGH (ref 150–400)
RBC: 3.85 MIL/uL — ABNORMAL LOW (ref 3.87–5.11)
RDW: 13.9 % (ref 11.5–15.5)
WBC: 11 10*3/uL — ABNORMAL HIGH (ref 4.0–10.5)
nRBC: 0 % (ref 0.0–0.2)

## 2022-07-14 NOTE — Care Management Obs Status (Signed)
[facility] NOTIFICATION   Patient Details  Name: ILEY DEIGNAN MRN: [id number] Date of Birth: 1956/05/09   Medicare Observation Status Notification Given:  Yes    Beverly Sessions, RN 07/14/2022, 10:41 AM

## 2022-07-14 NOTE — Discharge Summary (Signed)
Physician Discharge Summary  Teresa [name] DOB: [birth year] DOA: 07/13/2022  PCP: Tracie Harrier, MD  Admit date: 07/13/2022 Discharge date: 07/14/2022  Admitted From: Home Disposition:  Home  Recommendations for Outpatient Follow-up:  Follow up with PCP in 1-2 weeks Consider referral to outpatient gastroenterology  Home Health: No Equipment/Devices: None  Discharge Condition: Stable CODE STATUS: Full Diet recommendation: Regular  Brief/Interim Summary:  66 y.o. female with medical history significant of hypertension (patient denies this diagnosis), hyperlipidemia, GERD, depression, refused blood transfusion due to religious reason, migraine headache, melanoma, chronic pain, iron deficiency anemia, who presents with nausea, vomiting, diarrhea and abdominal pain.   Patient states that she has intermittent nausea, vomiting, diarrhea and abdominal pain for more than 2 weeks.  She states that sometimes she has 5-6 times of loose stool bowel movement per day.  She also reports several episodes of nonbilious nonbloody vomiting some days.  Her abdominal pain is diffuse, but worse on the right lower quadrant, mild to moderate, sharp, nonradiating.  Denies fever or chills.  Patient states that she took a course of Keflex in July for right ankle injury.  No chest pain, cough, shortness breath.  No symptoms of UTI  Infectious work-up overall reassuring.  GI PCR and C. difficile assay both negative.  Procalcitonin normal.  On day of discharge patient tolerating p.o. intake without nausea vomiting or diarrhea.  I suspect viral gastroenteritis.  Patient endorses a longstanding history of alternating diarrhea and constipation.  Presentation may be consistent with irritable bowel syndrome.  Patient reports her last colonoscopy was 20 years ago.  I encouraged her to seek follow-up with her primary care physician and request referral to gastroenterology to consider possible endoscopy and  pharmacologic therapy for irritable bowel syndrome.  Post discharge instructions explained at length to patient and husband at bedside.  At time of discharge I recommend a soft bland diet, plenty of hydration.  As needed Bentyl and simethicone recommended.  Offered prescription for patient states she has these medications at home.  No changes made in home medication regimen.  Patient stable for discharge home at this time.    Discharge Diagnoses:  Principal Problem:   Gastroenteritis Active Problems:   SIRS (systemic inflammatory response syndrome) (HCC)   Migraine   Iron deficiency anemia   Hypokalemia   Depression   Elevated lipase  Acute gastroenteritis Suspect viral gastroenteritis.  Patient had significant white count on arrival, this nearly normalized at time of discharge.  No hypotension noted.  GI PCR and C. difficile assay both negative.  Suspect viral gastroenteritis.  Symptomatic treatment.  Stable discharge home.  Discharge Instructions  Discharge Instructions     Diet - low sodium heart healthy   Complete by: As directed    Increase activity slowly   Complete by: As directed       Allergies as of 07/14/2022       Reactions   Elastic Bandages & [zinc] Rash   Morphine Rash    Pt thinks she had rash after extended use        Medication List     STOP taking these medications    amoxicillin-clavulanate 875-125 MG tablet Commonly known as: AUGMENTIN   oxyCODONE-acetaminophen 5-325 MG tablet Commonly known as: PERCOCET/ROXICET       TAKE these medications    acetaminophen 500 MG tablet Commonly known as: TYLENOL Take 500-1,000 mg by mouth every 6 (six) hours as needed (pain.).   alendronate 70 MG tablet Commonly known  as: FOSAMAX Take 1 tablet (70 mg total) by mouth every Wednesday. Take with a full glass of water on an empty stomach.   ALPRAZolam 0.5 MG tablet Commonly known as: XANAX Take 0.5 mg by mouth at bedtime as needed.    aspirin-acetaminophen-caffeine 250-250-65 MG tablet Commonly known as: EXCEDRIN MIGRAINE Take 1 tablet by mouth 3 (three) times daily as needed for headache.   CALTRATE 600+D3 PO Take 1 tablet by mouth daily with breakfast.   [employer] VITAMIN B12 1000 MCG tablet Generic drug: cyanocobalamin Take 1,000 mcg by mouth every other day. In the morning   dicyclomine 20 MG tablet Commonly known as: BENTYL Take 20 mg by mouth 4 (four) times daily as needed (bloating/indigestion.).   ferrous gluconate 324 MG tablet Commonly known as: FERGON Take 648 mg by mouth daily before lunch.   FIBER CHOICE PO Take 1 tablet by mouth at bedtime. Health Plus Super Colon Cleanse   gabapentin 300 MG capsule Commonly known as: NEURONTIN Take 600 mg by mouth 2 (two) times daily. With breakfast & with lunch   Magnesium 250 MG Tabs Take 250 mg by mouth daily with breakfast.   nortriptyline 10 MG capsule Commonly known as: PAMELOR Take 50 mg by mouth daily with lunch.   pantoprazole 40 MG tablet Commonly known as: PROTONIX Take by mouth.   topiramate 100 MG tablet Commonly known as: TOPAMAX Take 100 mg by mouth at bedtime.   vitamin C 1000 MG tablet Take 1,000 mg by mouth daily before lunch.   Vitamin D3 125 MCG (5000 UT) Tabs Take 5,000 Units by mouth daily with breakfast.   zolpidem 12.5 MG CR tablet Commonly known as: AMBIEN CR Take 1 tablet by mouth at bedtime as needed. What changed: Another medication with the same name was removed. Continue taking this medication, and follow the directions you see here.        Allergies  Allergen Reactions   Elastic Bandages & [Zinc] Rash   Morphine Rash     Pt thinks she had rash after extended use    Consultations: None   Procedures/Studies: CT ABDOMEN PELVIS W CONTRAST  Result Date: 07/13/2022 CLINICAL DATA:  Acute and nonlocalized abdominal pain EXAM: CT ABDOMEN AND PELVIS WITH CONTRAST TECHNIQUE: Multidetector CT imaging of the abdomen  and pelvis was performed using the standard protocol following bolus administration of intravenous contrast. RADIATION DOSE REDUCTION: This exam was performed according to the departmental dose-optimization program which includes automated exposure control, adjustment of the mA and/or kV according to patient size and/or use of iterative reconstruction technique. CONTRAST:  131m OMNIPAQUE IOHEXOL 300 MG/ML  SOLN COMPARISON:  06/17/2021 FINDINGS: Lower chest:  Mild scarring or atelectasis at the lung bases. Hepatobiliary: No focal liver abnormality.Cholecystectomy. Pancreas: Unremarkable. Spleen: Unremarkable. Adrenals/Urinary Tract: Negative adrenals. No hydronephrosis or ureteral stone. 2 mm stone at the lower pole right kidney. Unremarkable bladder. Stomach/Bowel: Diffuse fluid filling of small and large bowel, moderately affecting the stomach. No obstructive pattern. Distally there is formed stool. Small scattered ingested high-density structures which may be pills. Vascular/Lymphatic: No acute vascular abnormality. Prominence of mesenteric lymph nodes diffusely, likely reactive to the bowel findings. Reproductive:Hysterectomy. Other: No ascites or pneumoperitoneum. Musculoskeletal: No acute abnormalities. Generalized lumbar spine degeneration and osteopenia. IMPRESSION: 1. Diffuse fluid-filled bowel with prominence of mesenteric lymph nodes, question gastroenteritis. 2. Small right renal calculus. Electronically Signed   By: JJorje GuildM.D.   On: 07/13/2022 07:30      Subjective: And examined on the  day of discharge.  Stable no distress.  Stable for discharge home.  Discharge Exam: Vitals:   07/14/22 0440 07/14/22 0747  BP: (!) 109/56 (!) 108/59  Pulse: 93 93  Resp: 16 17  Temp: 98.1 F (36.7 C) 98.4 F (36.9 C)  SpO2: 98% 95%   Vitals:   07/13/22 1341 07/13/22 1919 07/14/22 0440 07/14/22 0747  BP: 116/80 (!) 131/56 (!) 109/56 (!) 108/59  Pulse: 91 92 93 93  Resp: '18 16 16 17  '$ Temp:  97.7 F (36.5 C) 98.1 F (36.7 C) 98.1 F (36.7 C) 98.4 F (36.9 C)  TempSrc: Oral Oral Oral   SpO2: 100% 99% 98% 95%  Weight:      Height:        General: Pt is alert, awake, not in acute distress Cardiovascular: RRR, S1/S2 +, no rubs, no gallops Respiratory: CTA bilaterally, no wheezing, no rhonchi Abdominal: Soft, NT, ND, bowel sounds + Extremities: no edema, no cyanosis    The results of significant diagnostics from this hospitalization (including imaging, microbiology, ancillary and laboratory) are listed below for reference.     Microbiology: Recent Results (from the past 240 hour(s))  C Difficile Quick Screen w PCR reflex     Status: None   Collection Time: 07/13/22  5:05 AM   Specimen: STOOL  Result Value Ref Range Status   C Diff antigen NEGATIVE NEGATIVE Final   C Diff toxin NEGATIVE NEGATIVE Final   C Diff interpretation No C. difficile detected.  Final    Comment: Performed at [facility], [address] [place], [place] [address]  Gastrointestinal Panel by PCR , Stool     Status: None   Collection Time: 07/13/22  5:05 AM   Specimen: Stool  Result Value Ref Range Status   Campylobacter species NOT DETECTED NOT DETECTED Final   Plesimonas shigelloides NOT DETECTED NOT DETECTED Final   Salmonella species NOT DETECTED NOT DETECTED Final   Yersinia enterocolitica NOT DETECTED NOT DETECTED Final   Vibrio species NOT DETECTED NOT DETECTED Final   Vibrio cholerae NOT DETECTED NOT DETECTED Final   Enteroaggregative E coli (EAEC) NOT DETECTED NOT DETECTED Final   Enteropathogenic E coli (EPEC) NOT DETECTED NOT DETECTED Final   Enterotoxigenic E coli (ETEC) NOT DETECTED NOT DETECTED Final   Shiga like toxin producing E coli (STEC) NOT DETECTED NOT DETECTED Final   Shigella/Enteroinvasive E coli (EIEC) NOT DETECTED NOT DETECTED Final   Cryptosporidium NOT DETECTED NOT DETECTED Final   Cyclospora cayetanensis NOT DETECTED NOT DETECTED Final   Entamoeba  histolytica NOT DETECTED NOT DETECTED Final   Giardia lamblia NOT DETECTED NOT DETECTED Final   Adenovirus F40/41 NOT DETECTED NOT DETECTED Final   Astrovirus NOT DETECTED NOT DETECTED Final   Norovirus GI/GII NOT DETECTED NOT DETECTED Final   Rotavirus A NOT DETECTED NOT DETECTED Final   Sapovirus (I, II, IV, and V) NOT DETECTED NOT DETECTED Final    Comment: Performed at [facility], [address] [place], [place] [address]  Blood culture (routine x 2)     Status: None (Preliminary result)   Collection Time: 07/13/22  6:59 PM   Specimen: BLOOD LEFT HAND  Result Value Ref Range Status   Specimen Description BLOOD LEFT HAND  Final   Special Requests   Final    BOTTLES DRAWN AEROBIC ONLY Blood Culture results may not be optimal due to an inadequate volume of blood received in culture bottles   Culture   Final  NO GROWTH < 12 HOURS Performed at [facility], [address] [place], [place] [address]    Report Status PENDING  Incomplete  Blood culture (routine x 2)     Status: None (Preliminary result)   Collection Time: 07/13/22  7:00 PM   Specimen: BLOOD RIGHT HAND  Result Value Ref Range Status   Specimen Description BLOOD RIGHT HAND  Final   Special Requests   Final    BOTTLES DRAWN AEROBIC ONLY Blood Culture adequate volume   Culture   Final    NO GROWTH < 12 HOURS Performed at [facility], [address], [place], [place] [address]    Report Status PENDING  Incomplete     Labs: BNP (last 3 results) No results for input(s): "BNP" in the last 8760 hours. Basic Metabolic Panel: Recent Labs  Lab 07/13/22 0422 07/14/22 0448  NA 142 144  K 3.4* 3.5  CL 112* 120*  CO2 22 20*  GLUCOSE 112* 100*  BUN 16 10  CREATININE 0.80 0.70  CALCIUM 9.0 8.0*  MG 2.1  --    Liver Function Tests: Recent Labs  Lab 07/13/22 0422  AST 14*  ALT 16  ALKPHOS 44  BILITOT 0.2*  PROT 7.2  ALBUMIN 3.6   Recent Labs  Lab 07/13/22 0422  LIPASE  141*   No results for input(s): "AMMONIA" in the last 168 hours. CBC: Recent Labs  Lab 07/13/22 0422 07/14/22 0448  WBC 20.7* 11.0*  HGB 13.3 10.5*  HCT 42.7 33.2*  MCV 87.7 86.2  PLT 576* 469*   Cardiac Enzymes: No results for input(s): "CKTOTAL", "CKMB", "CKMBINDEX", "TROPONINI" in the last 168 hours. BNP: Invalid input(s): "POCBNP" CBG: No results for input(s): "GLUCAP" in the last 168 hours. D-Dimer No results for input(s): "DDIMER" in the last 72 hours. Hgb A1c No results for input(s): "HGBA1C" in the last 72 hours. Lipid Profile No results for input(s): "CHOL", "HDL", "LDLCALC", "TRIG", "CHOLHDL", "LDLDIRECT" in the last 72 hours. Thyroid function studies No results for input(s): "TSH", "T4TOTAL", "T3FREE", "THYROIDAB" in the last 72 hours.  Invalid input(s): "FREET3" Anemia work up No results for input(s): "VITAMINB12", "FOLATE", "FERRITIN", "TIBC", "IRON", "RETICCTPCT" in the last 72 hours. Urinalysis    Component Value Date/Time   COLORURINE YELLOW (A) 07/13/2022 0422   APPEARANCEUR HAZY (A) 07/13/2022 0422   LABSPEC 1.019 07/13/2022 0422   PHURINE 5.0 07/13/2022 0422   GLUCOSEU NEGATIVE 07/13/2022 0422   HGBUR NEGATIVE 07/13/2022 0422   BILIRUBINUR NEGATIVE 07/13/2022 0422   KETONESUR NEGATIVE 07/13/2022 0422   PROTEINUR NEGATIVE 07/13/2022 0422   NITRITE NEGATIVE 07/13/2022 0422   LEUKOCYTESUR NEGATIVE 07/13/2022 0422   Sepsis Labs Recent Labs  Lab 07/13/22 0422 07/14/22 0448  WBC 20.7* 11.0*   Microbiology Recent Results (from the past 240 hour(s))  C Difficile Quick Screen w PCR reflex     Status: None   Collection Time: 07/13/22  5:05 AM   Specimen: STOOL  Result Value Ref Range Status   C Diff antigen NEGATIVE NEGATIVE Final   C Diff toxin NEGATIVE NEGATIVE Final   C Diff interpretation No C. difficile detected.  Final    Comment: Performed at [facility], [address] [place], [place] [address]  Gastrointestinal Panel by  PCR , Stool     Status: None   Collection Time: 07/13/22  5:05 AM   Specimen: Stool  Result Value Ref Range Status   Campylobacter species NOT DETECTED NOT DETECTED Final   Plesimonas  shigelloides NOT DETECTED NOT DETECTED Final   Salmonella species NOT DETECTED NOT DETECTED Final   Yersinia enterocolitica NOT DETECTED NOT DETECTED Final   Vibrio species NOT DETECTED NOT DETECTED Final   Vibrio cholerae NOT DETECTED NOT DETECTED Final   Enteroaggregative E coli (EAEC) NOT DETECTED NOT DETECTED Final   Enteropathogenic E coli (EPEC) NOT DETECTED NOT DETECTED Final   Enterotoxigenic E coli (ETEC) NOT DETECTED NOT DETECTED Final   Shiga like toxin producing E coli (STEC) NOT DETECTED NOT DETECTED Final   Shigella/Enteroinvasive E coli (EIEC) NOT DETECTED NOT DETECTED Final   Cryptosporidium NOT DETECTED NOT DETECTED Final   Cyclospora cayetanensis NOT DETECTED NOT DETECTED Final   Entamoeba histolytica NOT DETECTED NOT DETECTED Final   Giardia lamblia NOT DETECTED NOT DETECTED Final   Adenovirus F40/41 NOT DETECTED NOT DETECTED Final   Astrovirus NOT DETECTED NOT DETECTED Final   Norovirus GI/GII NOT DETECTED NOT DETECTED Final   Rotavirus A NOT DETECTED NOT DETECTED Final   Sapovirus (I, II, IV, and V) NOT DETECTED NOT DETECTED Final    Comment: Performed at [facility], [address] [place], [place] [address]  Blood culture (routine x 2)     Status: None (Preliminary result)   Collection Time: 07/13/22  6:59 PM   Specimen: BLOOD LEFT HAND  Result Value Ref Range Status   Specimen Description BLOOD LEFT HAND  Final   Special Requests   Final    BOTTLES DRAWN AEROBIC ONLY Blood Culture results may not be optimal due to an inadequate volume of blood received in culture bottles   Culture   Final    NO GROWTH < 12 HOURS Performed at [facility], [address] [place], [place] [address]    Report Status PENDING  Incomplete  Blood culture (routine x 2)      Status: None (Preliminary result)   Collection Time: 07/13/22  7:00 PM   Specimen: BLOOD RIGHT HAND  Result Value Ref Range Status   Specimen Description BLOOD RIGHT HAND  Final   Special Requests   Final    BOTTLES DRAWN AEROBIC ONLY Blood Culture adequate volume   Culture   Final    NO GROWTH < 12 HOURS Performed at [facility], [address], [place],  [address]    Report Status PENDING  Incomplete     Time coordinating discharge: Over 30 minutes  SIGNED:   Sidney Ace, MD  [facility] 07/14/2022, 1:29 PM Pager   If 7PM-7AM, please contact night-coverage

## 2022-07-14 NOTE — Care Management CC44 (Signed)
Condition Code 44 Documentation Completed  Patient Details  Name: Teresa [name] MRN: [id number] Date of Birth: [birth year]   Condition Code 44 given:  Yes Patient signature on Condition Code 44 notice:  Yes Documentation of 2 MD's agreement:  Yes Code 44 added to claim:  Yes    Beverly Sessions, RN 07/14/2022, 10:41 AM

## 2022-07-14 NOTE — Progress Notes (Signed)
Pt discharged to home. DC instructions given with husband at bedside. No concerns voiced. Pt left unit ambulatory accompanied by husband. Left in stable condition. Refused to be wheeled out.

## 2022-07-15 LAB — HIV ANTIBODY (ROUTINE TESTING W REFLEX): HIV Screen 4th Generation wRfx: NONREACTIVE

## 2022-07-18 LAB — CULTURE, BLOOD (ROUTINE X 2)
Culture: NO GROWTH
Culture: NO GROWTH
Special Requests: ADEQUATE

## 2022-07-23 ENCOUNTER — Encounter: Payer: Self-pay | Admitting: Oncology

## 2022-07-24 ENCOUNTER — Encounter: Payer: Self-pay | Admitting: Student in an Organized Health Care Education/Training Program

## 2022-07-24 ENCOUNTER — Ambulatory Visit
Payer: No Typology Code available for payment source | Attending: Student in an Organized Health Care Education/Training Program | Admitting: Student in an Organized Health Care Education/Training Program

## 2022-07-24 VITALS — BP 135/65 | HR 100 | Temp 97.3°F | Resp 17 | Ht 67.0 in | Wt 130.0 lb

## 2022-07-24 DIAGNOSIS — M25512 Pain in left shoulder: Secondary | ICD-10-CM | POA: Diagnosis not present

## 2022-07-24 DIAGNOSIS — M47816 Spondylosis without myelopathy or radiculopathy, lumbar region: Secondary | ICD-10-CM | POA: Diagnosis not present

## 2022-07-24 DIAGNOSIS — M67912 Unspecified disorder of synovium and tendon, left shoulder: Secondary | ICD-10-CM | POA: Diagnosis not present

## 2022-07-24 DIAGNOSIS — M7918 Myalgia, other site: Secondary | ICD-10-CM | POA: Insufficient documentation

## 2022-07-24 DIAGNOSIS — G8929 Other chronic pain: Secondary | ICD-10-CM | POA: Diagnosis not present

## 2022-07-24 MED ORDER — TIZANIDINE HCL 2 MG PO TABS: 2.0000 mg | ORAL_TABLET | Freq: Four times a day (QID) | ORAL | 2 refills | Status: DC | PRN

## 2022-07-24 MED ORDER — ROPIVACAINE HCL 2 MG/ML IJ SOLN
9.0000 mL | Freq: Once | INTRAMUSCULAR | Status: AC
  Administered 2022-07-24: 9 mL via PERINEURAL
  Filled 2022-07-24: qty 20

## 2022-07-24 MED ORDER — HYDROCODONE-ACETAMINOPHEN 7.5-325 MG PO TABS: 1.0000 | ORAL_TABLET | Freq: Four times a day (QID) | ORAL | 0 refills | Status: DC | PRN

## 2022-07-24 NOTE — Progress Notes (Signed)
Safety precautions to be maintained throughout the outpatient stay will include: orient to surroundings, keep bed in low position, maintain call bell within reach at all times, provide assistance with transfer out of bed and ambulation. Nursing Pain Medication Assessment:  Safety precautions to be maintained throughout the outpatient stay will include: orient to surroundings, keep bed in low position, maintain call bell within reach at all times, provide assistance with transfer out of bed and ambulation.  Medication Inspection Compliance: Pill count conducted under aseptic conditions, in front of the patient. Neither the pills nor the bottle was removed from the patient's sight at any time. Once count was completed pills were immediately returned to the patient in their original bottle.  Medication: Hydrocodone/APAP Pill/Patch Count:  5 of 30 pills remain Pill/Patch Appearance: Markings consistent with prescribed medication Bottle Appearance: Standard pharmacy container. Clearly labeled. Filled Date: 78 / 30 / 2023 Last Medication intake:   "3 or 4 months ago"

## 2022-07-24 NOTE — Progress Notes (Signed)
PROVIDER NOTE: Information contained herein reflects review and annotations entered in association with encounter. Interpretation of such information and data should be left to medically-trained personnel. Information provided to patient can be located elsewhere in the medical record under "Patient Instructions". Document created using STT-dictation technology, any transcriptional errors that may result from process are unintentional.    Patient: Teresa [name]  Service Category: E/M  Provider: Gillis Santa, MD  DOB: [birth year]  DOS: 07/24/2022  Referring Provider: Tracie Harrier, MD  MRN: [id number]  Specialty: Interventional Pain Management  PCP: Tracie Harrier, MD  Type: Established Patient  Setting: Ambulatory outpatient    Location: Office  Delivery: Face-to-face     HPI  Teresa [name], a 66 y.o. year old female, is here today because of her Myofascial pain syndrome [M79.18]. [name] primary complain today is Shoulder Pain (RIGHT) Last encounter: My last encounter with her was on 11/26/2021 Pertinent problems: [name] has INSOMNIA, CHRONIC; DEPRESSION; BACK PAIN, CHRONIC; Lumbar radiculopathy (right L5); Lumbar facet arthropathy; Degeneration of lumbar or lumbosacral intervertebral disc; Chronic pain syndrome; Arthritis of left glenohumeral joint; and Left rotator cuff tear arthropathy on their pertinent problem list. Pain Assessment: Severity of Chronic pain is reported as a 3 /10. Location: Shoulder Right/down right arm to fingers. Onset: More than a month ago. Quality: Aching. Timing: Constant. Modifying factor(s): massage. Vitals:  height is 5' 7" (1.702 m) and weight is 130 lb (59 kg). Her temporal temperature is 97.3 F (36.3 C) (abnormal). Her blood pressure is 135/65 and her pulse is 100. Her respiration is 17 and oxygen saturation is 100%.   Reason for encounter: medication management.   Patient presents today for medication management as well as increased right  myofascial pain along her trapezius and periscapular muscles.  She takes hydrocodone very sporadically and only as needed. She is also requesting a refill of her tizanidine. We will also plan for a bilateral trapezius and periscapular trigger point injection with 0.2% ropivacaine.  See below for procedure note.  Pharmacotherapy Assessment  Analgesic: Hydrocodone 7.5 mg every 6 hours as needed as needed for breakthrough pain  Monitoring: [place] PMP: PDMP reviewed during this encounter.       Pharmacotherapy: No side-effects or adverse reactions reported. Compliance: No problems identified. Effectiveness: Clinically acceptable.  Rise Patience, RN  07/24/2022  8:22 AM  Sign when Signing Visit Safety precautions to be maintained throughout the outpatient stay will include: orient to surroundings, keep bed in low position, maintain call bell within reach at all times, provide assistance with transfer out of bed and ambulation. Nursing Pain Medication Assessment:  Safety precautions to be maintained throughout the outpatient stay will include: orient to surroundings, keep bed in low position, maintain call bell within reach at all times, provide assistance with transfer out of bed and ambulation.  Medication Inspection Compliance: Pill count conducted under aseptic conditions, in front of the patient. Neither the pills nor the bottle was removed from the patient's sight at any time. Once count was completed pills were immediately returned to the patient in their original bottle.  Medication: Hydrocodone/APAP Pill/Patch Count:  5 of 30 pills remain Pill/Patch Appearance: Markings consistent with prescribed medication Bottle Appearance: Standard pharmacy container. Clearly labeled. Filled Date: 35 / 30 / 2023 Last Medication intake:   "3 or 4 months ago"    No results found for: "CBDTHCR" No results found for: "D8THCCBX" No results found for: "D9THCCBX"  UDS:  Summary  Date Value Ref Range Status  04/03/2020 Note  Final    Comment:    ==================================================================== [employer] Select 13 (MW) ==================================================================== Test                             Result       Flag       Units Drug Absent but Declared for Prescription Verification   Oxycodone                      Not Detected UNEXPECTED ng/mg creat ==================================================================== Test                      Result    Flag   Units      Ref Range   Creatinine              194              mg/dL      >=20 ==================================================================== Declared Medications:  The flagging and interpretation on this report are based on the  following declared medications.  Unexpected results may arise from  inaccuracies in the declared medications.  **Note: The testing scope of this panel includes these medications:  Oxycodone  **Note: The testing scope of this panel does not include the  following reported medications:  Acetaminophen  Alendronate (Fosamax)  Buspirone (Buspar)  Calcium  Cholecalciferol  Gabapentin (Neurontin)  Iron (Fergon)  Multivitamin  Nortriptyline (Pamelor)  Tizanidine (Zanaflex)  Topical  Topical Diclofenac  Topiramate (Topamax)  Trazodone (Desyrel)  Zolpidem (Ambien) ==================================================================== For clinical consultation, please call [phone]. ====================================================================       ROS  Constitutional: Denies any fever or chills Gastrointestinal: No reported hemesis, hematochezia, vomiting, or acute GI distress Musculoskeletal:  Bilateral trapezius and periscapular pain Neurological: No reported episodes of acute onset apraxia, aphasia, dysarthria, agnosia, amnesia, paralysis, loss of coordination, or loss of consciousness  Medication Review  ALPRAZolam, Calcium  Carb-Cholecalciferol, HYDROcodone-acetaminophen, Inulin, Magnesium, Vitamin D3, acetaminophen, alendronate, aspirin-acetaminophen-caffeine, cyanocobalamin, dicyclomine, ferrous gluconate, gabapentin, nortriptyline, pantoprazole, tiZANidine, topiramate, vitamin C, and zolpidem  History Review  Allergy: Ms. Arseneau is allergic to elastic bandages & [zinc] and morphine. Drug: Ms. Hornik  reports no history of drug use. Alcohol:  reports no history of alcohol use. Tobacco:  reports that she has never smoked. She has never used smokeless tobacco. Social: Ms. Kerekes  reports that she has never smoked. She has never used smokeless tobacco. She reports that she does not drink alcohol and does not use drugs. Medical:  has a past medical history of Dyspnea, Headache, History of fractured vertebra (1982), Hyperlipidemia, Low bone density, Melanoma ([place]), and Transfusion of blood product refused for religious reason. Surgical: Ms. Heffler  has a past surgical history that includes Abdominal hysterectomy; Colonoscopy (11/03/1998); Eye surgery (Bilateral, 11/03/2012); Artery Biopsy (Left, 12/18/2015); and melanoma excisison (Right). Family: family history includes Dementia in her mother; Heart attack in her father.  Laboratory Chemistry Profile   Renal Lab Results  Component Value Date   BUN 10 07/14/2022   CREATININE 0.70 07/14/2022   GFRAA >90 08/14/2014   GFRNONAA >60 07/14/2022    Hepatic Lab Results  Component Value Date   AST 14 (L) 07/13/2022   ALT 16 07/13/2022   ALBUMIN 3.6 07/13/2022   ALKPHOS 44 07/13/2022   LIPASE 141 (H) 07/13/2022    Electrolytes Lab Results  Component Value Date   NA 144 07/14/2022   K 3.5 07/14/2022  CL 120 (H) 07/14/2022   CALCIUM 8.0 (L) 07/14/2022   MG 2.1 07/13/2022    Bone No results found for: "VD25OH", "VD125OH2TOT", "CH8527PO2", "UM3536RW4", "25OHVITD1", "25OHVITD2", "25OHVITD3", "TESTOFREE", "TESTOSTERONE"  Inflammation (CRP: Acute Phase) (ESR:  Chronic Phase) Lab Results  Component Value Date   LATICACIDVEN 1.1 07/13/2022         Note: Above Lab results reviewed.  Recent Imaging Review  CT ABDOMEN PELVIS W CONTRAST CLINICAL DATA:  Acute and nonlocalized abdominal pain  EXAM: CT ABDOMEN AND PELVIS WITH CONTRAST  TECHNIQUE: Multidetector CT imaging of the abdomen and pelvis was performed using the standard protocol following bolus administration of intravenous contrast.  RADIATION DOSE REDUCTION: This exam was performed according to the departmental dose-optimization program which includes automated exposure control, adjustment of the mA and/or kV according to patient size and/or use of iterative reconstruction technique.  CONTRAST:  155m OMNIPAQUE IOHEXOL 300 MG/ML  SOLN  COMPARISON:  06/17/2021  FINDINGS: Lower chest:  Mild scarring or atelectasis at the lung bases.  Hepatobiliary: No focal liver abnormality.Cholecystectomy.  Pancreas: Unremarkable.  Spleen: Unremarkable.  Adrenals/Urinary Tract: Negative adrenals. No hydronephrosis or ureteral stone. 2 mm stone at the lower pole right kidney. Unremarkable bladder.  Stomach/Bowel: Diffuse fluid filling of small and large bowel, moderately affecting the stomach. No obstructive pattern. Distally there is formed stool. Small scattered ingested high-density structures which may be pills.  Vascular/Lymphatic: No acute vascular abnormality. Prominence of mesenteric lymph nodes diffusely, likely reactive to the bowel findings.  Reproductive:Hysterectomy.  Other: No ascites or pneumoperitoneum.  Musculoskeletal: No acute abnormalities. Generalized lumbar spine degeneration and osteopenia.  IMPRESSION: 1. Diffuse fluid-filled bowel with prominence of mesenteric lymph nodes, question gastroenteritis. 2. Small right renal calculus.  Electronically Signed   By: JJorje GuildM.D.   On: 07/13/2022 07:30 Note: Reviewed        Physical Exam  General  appearance: Well nourished, well developed, and well hydrated. In no apparent acute distress Mental status: Alert, oriented x 3 (person, place, & time)       Respiratory: No evidence of acute respiratory distress Eyes: PERLA Vitals: BP 135/65   Pulse 100   Temp (!) 97.3 F (36.3 C) (Temporal)   Resp 17   Ht 5' 7" (1.702 m)   Wt 130 lb (59 kg)   SpO2 100%   BMI 20.36 kg/m  BMI: Estimated body mass index is 20.36 kg/m as calculated from the following:   Height as of this encounter: 5' 7" (1.702 m).   Weight as of this encounter: 130 lb (59 kg). Ideal: Ideal body weight: 61.6 kg (135 lb 12.9 oz)  Cervicalgia, bilateral trapezius and periscapular pain   Trigger point injection was done in the bilateral periscapular and trigger point region.  Area was cleaned with chlorhexidine.  Each trigger point was injected with 0.5 to 1 cc of 0.2% ropivacaine.  Needling was also done.  Assessment   Diagnosis Status  1. Myofascial pain syndrome   2. Chronic left shoulder pain   3. Dysfunction of left rotator cuff   4. Lumbar facet arthropathy (L3/4 and L4/5)   5. Lumbar spondylosis    Flare-up Controlled Controlled   Updated Problems: No problems updated.   Plan of Care    Ms. DKRYSTYNE TEWKSBURYhas a current medication list which includes the following long-term medication(s): calcium carb-cholecalciferol, dicyclomine, ferrous gluconate, pantoprazole, topiramate, and zolpidem.  Pharmacotherapy (Medications Ordered): Meds ordered this encounter  Medications   ropivacaine (PF) 2 mg/mL (0.2%) (  NAROPIN) injection 9 mL   HYDROcodone-acetaminophen (NORCO) 7.5-325 MG tablet    Sig: Take 1 tablet by mouth every 6 (six) hours as needed for moderate pain.    Dispense:  30 tablet    Refill:  0   tiZANidine (ZANAFLEX) 2 MG tablet    Sig: Take 1 tablet (2 mg total) by mouth every 6 (six) hours as needed for muscle spasms.    Dispense:  30 tablet    Refill:  2    Follow-up plan:   Return  if symptoms worsen or fail to improve.     Status post left L3, L4, L5, S1 RFA on 06/15/2019, R L3,4,5, S1 RFA on 06/29/2019, left posterior glenohumeral shoulder steroid injection 01/02/2020: Not effective.  Left L4 and Sprint peripheral nerve stimulation on 04/25/2020, removed 06/11/2020.  Left suprascapular nerve block 07/01/2021             Recent Visits No visits were found meeting these conditions. Showing recent visits within past 90 days and meeting all other requirements Today's Visits Date Type Provider Dept  07/24/22 Office Visit Lateef, Bilal, MD [facility]  Showing today's visits and meeting all other requirements Future Appointments No visits were found meeting these conditions. Showing future appointments within next 90 days and meeting all other requirements  I discussed the assessment and treatment plan with the patient. The patient was provided an opportunity to ask questions and all were answered. The patient agreed with the plan and demonstrated an understanding of the instructions.  Patient advised to call back or seek an in-person evaluation if the symptoms or condition worsens.  Duration of encounter: 30minutes.  Total time on encounter, as per AMA guidelines included both the face-to-face and non-face-to-face time personally spent by the physician and/or other qualified health care professional(s) on the day of the encounter (includes time in activities that require the physician or other qualified health care professional and does not include time in activities normally performed by clinical staff). Physician's time may include the following activities when performed: preparing to see the patient (eg, review of tests, pre-charting review of records) obtaining and/or reviewing separately obtained history performing a medically appropriate examination and/or evaluation counseling and educating the patient/family/caregiver ordering medications, tests, or  procedures referring and communicating with other health care professionals (when not separately reported) documenting clinical information in the electronic or other health record independently interpreting results (not separately reported) and communicating results to the patient/ family/caregiver care coordination (not separately reported)  Note by: Bilal Lateef, MD Date: 07/24/2022; Time: 9:33 AM 

## 2022-08-04 DIAGNOSIS — H5213 Myopia, bilateral: Secondary | ICD-10-CM | POA: Diagnosis not present

## 2022-08-04 DIAGNOSIS — Z135 Encounter for screening for eye and ear disorders: Secondary | ICD-10-CM | POA: Diagnosis not present

## 2022-08-04 DIAGNOSIS — H04123 Dry eye syndrome of bilateral lacrimal glands: Secondary | ICD-10-CM | POA: Diagnosis not present

## 2022-08-04 DIAGNOSIS — H524 Presbyopia: Secondary | ICD-10-CM | POA: Diagnosis not present

## 2022-08-04 DIAGNOSIS — Z961 Presence of intraocular lens: Secondary | ICD-10-CM | POA: Diagnosis not present

## 2022-08-04 DIAGNOSIS — H52223 Regular astigmatism, bilateral: Secondary | ICD-10-CM | POA: Diagnosis not present

## 2022-08-04 DIAGNOSIS — H538 Other visual disturbances: Secondary | ICD-10-CM | POA: Diagnosis not present

## 2022-08-08 ENCOUNTER — Other Ambulatory Visit: Payer: Self-pay | Admitting: Internal Medicine

## 2022-08-08 DIAGNOSIS — Z9181 History of falling: Secondary | ICD-10-CM

## 2022-08-08 DIAGNOSIS — H53132 Sudden visual loss, left eye: Secondary | ICD-10-CM

## 2022-08-09 ENCOUNTER — Ambulatory Visit
Admission: RE | Admit: 2022-08-09 | Discharge: 2022-08-09 | Disposition: A | Discharge status: Home / Self Care | Payer: No Typology Code available for payment source | Source: Ambulatory Visit | Attending: Internal Medicine | Admitting: Internal Medicine

## 2022-08-09 ENCOUNTER — Other Ambulatory Visit: Payer: Self-pay | Admitting: Internal Medicine

## 2022-08-09 DIAGNOSIS — H53132 Sudden visual loss, left eye: Secondary | ICD-10-CM | POA: Diagnosis not present

## 2022-08-09 DIAGNOSIS — Z9181 History of falling: Secondary | ICD-10-CM | POA: Diagnosis not present

## 2022-08-09 DIAGNOSIS — H5462 Unqualified visual loss, left eye, normal vision right eye: Secondary | ICD-10-CM | POA: Diagnosis not present

## 2022-08-09 DIAGNOSIS — Q283 Other malformations of cerebral vessels: Secondary | ICD-10-CM | POA: Diagnosis not present

## 2022-08-09 MED ORDER — GADOBUTROL 1 MMOL/ML IV SOLN
5.0000 mL | Freq: Once | INTRAVENOUS | Status: AC | PRN
  Administered 2022-08-09: 7.5 mL via INTRAVENOUS

## 2022-08-11 DIAGNOSIS — M316 Other giant cell arteritis: Secondary | ICD-10-CM | POA: Diagnosis not present

## 2022-08-13 DIAGNOSIS — Z6820 Body mass index (BMI) 20.0-20.9, adult: Secondary | ICD-10-CM | POA: Diagnosis not present

## 2022-08-13 DIAGNOSIS — E785 Hyperlipidemia, unspecified: Secondary | ICD-10-CM | POA: Diagnosis not present

## 2022-08-13 DIAGNOSIS — T466X5D Adverse effect of antihyperlipidemic and antiarteriosclerotic drugs, subsequent encounter: Secondary | ICD-10-CM | POA: Diagnosis not present

## 2022-08-13 DIAGNOSIS — G4700 Insomnia, unspecified: Secondary | ICD-10-CM | POA: Diagnosis not present

## 2022-08-13 DIAGNOSIS — K219 Gastro-esophageal reflux disease without esophagitis: Secondary | ICD-10-CM | POA: Diagnosis not present

## 2022-08-13 DIAGNOSIS — Z008 Encounter for other general examination: Secondary | ICD-10-CM | POA: Diagnosis not present

## 2022-08-13 DIAGNOSIS — I700 Atherosclerosis of aorta: Secondary | ICD-10-CM | POA: Diagnosis not present

## 2022-08-13 DIAGNOSIS — M545 Low back pain, unspecified: Secondary | ICD-10-CM | POA: Diagnosis not present

## 2022-08-20 DIAGNOSIS — Z0100 Encounter for examination of eyes and vision without abnormal findings: Secondary | ICD-10-CM | POA: Diagnosis not present

## 2022-09-10 ENCOUNTER — Encounter: Payer: Self-pay | Admitting: Oncology

## 2022-09-17 DIAGNOSIS — Z961 Presence of intraocular lens: Secondary | ICD-10-CM | POA: Diagnosis not present

## 2022-09-30 DIAGNOSIS — H5213 Myopia, bilateral: Secondary | ICD-10-CM | POA: Diagnosis not present

## 2022-09-30 DIAGNOSIS — H52223 Regular astigmatism, bilateral: Secondary | ICD-10-CM | POA: Diagnosis not present

## 2022-09-30 DIAGNOSIS — Z961 Presence of intraocular lens: Secondary | ICD-10-CM | POA: Diagnosis not present

## 2022-09-30 DIAGNOSIS — H538 Other visual disturbances: Secondary | ICD-10-CM | POA: Diagnosis not present

## 2022-09-30 DIAGNOSIS — H04123 Dry eye syndrome of bilateral lacrimal glands: Secondary | ICD-10-CM | POA: Diagnosis not present

## 2022-10-08 ENCOUNTER — Encounter: Payer: Self-pay | Admitting: Diagnostic Neuroimaging

## 2022-10-08 ENCOUNTER — Ambulatory Visit (INDEPENDENT_AMBULATORY_CARE_PROVIDER_SITE_OTHER): Payer: No Typology Code available for payment source | Admitting: Diagnostic Neuroimaging

## 2022-10-08 VITALS — BP 135/81 | HR 87 | Ht 66.0 in | Wt 125.0 lb

## 2022-10-08 DIAGNOSIS — H538 Other visual disturbances: Secondary | ICD-10-CM

## 2022-10-08 NOTE — Progress Notes (Signed)
[facility]  PATIENT: Teresa [name] DOB: 08/05/1956  REFERRING CLINICIAN: Tracie Harrier, MD HISTORY FROM: patient  REASON FOR VISIT: new consult   HISTORICAL  CHIEF COMPLAINT:  Chief Complaint  Patient presents with   New Patient (Initial Visit)    Pt with husband, rm 7. She had a sudden loss of vision in the left eye. She got new glassess in June and then July had the loss in vision. There is sudden change in left eye from the right eye. MRI was completed to look at optic nerve and was normal. She has went to several eye MD's since this. So recently had a contact made for the left eye to try but she is having difficulty with seeing out of it still. Brain fog, balance concern, fatigue.    Other    She had a fall in July that was pretty big and this took place prior to the vision loss. MRI was completed 08/09/22. They assessed for temporal arteritis and was negative.     HISTORY OF PRESENT ILLNESS:   66 year old female here for evaluation of left eye visual abnormality.  May 2023 patient had a fall resulting in left Achilles injury.  July 5 she fell and broke her right ankle.  July 29 she noticed a bump on her left eyelid as well as some vision problems on the left eye.  She went to see ophthalmologist Dr. Matilde Sprang for evaluation but no specific ocular problems were found.  She had left visual acuity problem that could not be corrected with lenses at that time.  She followed up with second opinion Dr. Florene Glen, who was able to correct the left eye vision acuity problem with lens correction and then patient referred back to Dr. Matilde Sprang.  She filled a prescription to help correct left eye vision problem but this could not help and seem to aggravate the problem.  She saw a third ophthalmologist Dr.Kim, who also could not specifically determine etiology of patient's subjective left eye vision complaints.  All funduscopic testing, visual field testing, OCT testing have been  normal.  MRI of the brain with and without contrast also was normal.  Patient continues to complain of blurred vision, shadow of double vision, with left eye open.  No problems when only right eye open.  Also having issues with memory decline, brain fog, fatigue, malaise and chronic pain issues.    REVIEW OF SYSTEMS: Full 14 system review of systems performed and negative with exception of: as per HPI.  ALLERGIES: Allergies  Allergen Reactions   Elastic Bandages & [Zinc] Rash   Morphine Rash     Pt thinks she had rash after extended use    HOME MEDICATIONS: Outpatient Medications Prior to Visit  Medication Sig Dispense Refill   acetaminophen (TYLENOL) 500 MG tablet Take 500-1,000 mg by mouth every 6 (six) hours as needed (pain.).     alendronate (FOSAMAX) 70 MG tablet Take 1 tablet (70 mg total) by mouth every Wednesday. Take with a full glass of water on an empty stomach.     Ascorbic Acid (VITAMIN C) 1000 MG tablet Take 1,000 mg by mouth daily before lunch.     aspirin-acetaminophen-caffeine (EXCEDRIN MIGRAINE) 250-250-65 MG tablet Take 1 tablet by mouth 3 (three) times daily as needed for headache.     Calcium Carb-Cholecalciferol (CALTRATE 600+D3 PO) Take 1 tablet by mouth daily with breakfast.     Cholecalciferol (VITAMIN D3) 125 MCG (5000 UT) TABS Take 5,000 Units by  mouth daily with breakfast.     [employer] VITAMIN B12 1000 MCG tablet Take 1,000 mcg by mouth every other day. In the morning     dicyclomine (BENTYL) 20 MG tablet Take 20 mg by mouth 4 (four) times daily as needed (bloating/indigestion.).     ferrous gluconate (FERGON) 324 MG tablet Take 648 mg by mouth daily before lunch.     gabapentin (NEURONTIN) 300 MG capsule Take 600 mg by mouth 2 (two) times daily. With breakfast & with lunch     Inulin (FIBER CHOICE PO) Take 1 tablet by mouth at bedtime. Health Plus Super Colon Cleanse     nortriptyline (PAMELOR) 10 MG capsule Take 50 mg by mouth daily with lunch.     tiZANidine  (ZANAFLEX) 2 MG tablet Take 1 tablet (2 mg total) by mouth every 6 (six) hours as needed for muscle spasms. 30 tablet 2   topiramate (TOPAMAX) 100 MG tablet Take 100 mg by mouth at bedtime.     zolpidem (AMBIEN CR) 12.5 MG CR tablet Take 1 tablet by mouth at bedtime as needed.     pantoprazole (PROTONIX) 40 MG tablet Take by mouth.     ALPRAZolam (XANAX) 0.5 MG tablet Take 0.5 mg by mouth at bedtime as needed.     HYDROcodone-acetaminophen (NORCO) 7.5-325 MG tablet Take 1 tablet by mouth every 6 (six) hours as needed for moderate pain. 30 tablet 0   Magnesium 250 MG TABS Take 250 mg by mouth daily with breakfast.     No facility-administered medications prior to visit.    PAST MEDICAL HISTORY: Past Medical History:  Diagnosis Date   Dyspnea    Headache    migraines   History of fractured vertebra 1982   3x   Hyperlipidemia    Low bone density    Melanoma ([place])    Resected from Right foot.    Transfusion of blood product refused for religious reason     PAST SURGICAL HISTORY: Past Surgical History:  Procedure Laterality Date   ABDOMINAL HYSTERECTOMY     ARTERY BIOPSY Left 12/18/2015   Procedure: BIOPSY TEMPORAL ARTERY;  Surgeon: Clyde Canterbury, MD;  Location: [facility];  Service: ENT;  Laterality: Left;   CHOLECYSTECTOMY     2021   COLONOSCOPY  11/03/1998   EYE SURGERY Bilateral 11/03/2012   Cataracts   melanoma excisison Right    1978    FAMILY HISTORY: Family History  Problem Relation Age of Onset   Heart attack Father    Dementia Mother     SOCIAL HISTORY: Social History   Socioeconomic History   Marital status: Married    Spouse name: Not on file   Number of children: Not on file   Years of education: Not on file   Highest education level: Not on file  Occupational History   Not on file  Tobacco Use   Smoking status: Never   Smokeless tobacco: Never  Vaping Use   Vaping Use: Never used  Substance and Sexual Activity   Alcohol use: No   Drug  use: No   Sexual activity: Not on file  Other Topics Concern   Not on file  Social History Narrative   Not on file   Social Determinants of Health   Financial Resource Strain: Not on file  Food Insecurity: No Food Insecurity (07/13/2022)   Hunger Vital Sign    Worried About Running Out of Food in the Last Year: Never true    Ran  Out of Food in the Last Year: Never true  Transportation Needs: No Transportation Needs (07/13/2022)   PRAPARE - Hydrologist (Medical): No    Lack of Transportation (Non-Medical): No  Physical Activity: Not on file  Stress: Not on file  Social Connections: Not on file  Intimate Partner Violence: Not At Risk (07/13/2022)   Humiliation, Afraid, Rape, and Kick questionnaire    Fear of Current or Ex-Partner: No    Emotionally Abused: No    Physically Abused: No    Sexually Abused: No     PHYSICAL EXAM  GENERAL EXAM/CONSTITUTIONAL: Vitals:  Vitals:   10/08/22 0902  BP: 135/81  Pulse: 87  Weight: 125 lb (56.7 kg)  Height: '5\' 6"'$  (1.676 m)   Body mass index is 20.18 kg/m. Wt Readings from Last 3 Encounters:  10/08/22 125 lb (56.7 kg)  07/24/22 130 lb (59 kg)  07/13/22 131 lb 2.8 oz (59.5 kg)   Patient is in no distress; well developed, nourished and groomed; neck is supple  CARDIOVASCULAR: Examination of carotid arteries is normal; no carotid bruits Regular rate and rhythm, no murmurs Examination of peripheral vascular system by observation and palpation is normal  EYES: Ophthalmoscopic exam of optic discs and posterior segments is normal; no papilledema or hemorrhages Vision Screening   Right eye Left eye Both eyes  Without correction     With correction 20/40 20/200   Comments: For both eyes was able to read at 20/30 with middle column and right column but could not see anything to the left except for on row 1 which was 20/200.    MUSCULOSKELETAL: Gait, strength, tone, movements noted in Neurologic exam  below  NEUROLOGIC: MENTAL STATUS:      No data to display         awake, alert, oriented to person, place and time recent and remote memory intact normal attention and concentration language fluent, comprehension intact, naming intact fund of knowledge appropriate  CRANIAL NERVE:  2nd - no papilledema on fundoscopic exam 2nd, 3rd, 4th, 6th - pupils equal and reactive to light, visual fields full to confrontation, extraocular muscles intact, no nystagmus; SUBJECTIVE BLURRED VISION IN LEFT EYE; SUBJECTIVE SHADOW / DOUBLE VISION IN LEFT EYE ONLY 5th - facial sensation symmetric 7th - facial strength symmetric 8th - hearing intact 9th - palate elevates symmetrically, uvula midline 11th - shoulder shrug symmetric 12th - tongue protrusion midline  MOTOR:  normal bulk and tone, full strength in the BUE, BLE  SENSORY:  normal and symmetric to light touch, temperature, vibration  COORDINATION:  finger-nose-finger, fine finger movements normal  REFLEXES:  deep tendon reflexes TRACE and symmetric  GAIT/STATION:  narrow based gait     DIAGNOSTIC DATA (LABS, IMAGING, TESTING) - I reviewed patient records, labs, notes, testing and imaging myself where available.  Lab Results  Component Value Date   WBC 11.0 (H) 07/14/2022   HGB 10.5 (L) 07/14/2022   HCT 33.2 (L) 07/14/2022   MCV 86.2 07/14/2022   PLT 469 (H) 07/14/2022      Component Value Date/Time   NA 144 07/14/2022 0448   K 3.5 07/14/2022 0448   CL 120 (H) 07/14/2022 0448   CO2 20 (L) 07/14/2022 0448   GLUCOSE 100 (H) 07/14/2022 0448   BUN 10 07/14/2022 0448   CREATININE 0.70 07/14/2022 0448   CALCIUM 8.0 (L) 07/14/2022 0448   PROT 7.2 07/13/2022 0422   ALBUMIN 3.6 07/13/2022 0422   AST 14 (  L) 07/13/2022 0422   ALT 16 07/13/2022 0422   ALKPHOS 44 07/13/2022 0422   BILITOT 0.2 (L) 07/13/2022 0422   GFRNONAA >60 07/14/2022 0448   GFRAA >90 08/14/2014 1130   No results found for: "CHOL", "HDL", "LDLCALC",  "LDLDIRECT", "TRIG", "CHOLHDL" No results found for: "HGBA1C" No results found for: "VITAMINB12" No results found for: "TSH"   10/78/23 MRI brain [I reviewed images myself and agree with interpretation. -VRP]  - Unremarkable MRI appearance of the brain. No evidence of acute intracranial abnormality.   ASSESSMENT AND PLAN  66 y.o. year old female here with:  Dx:  1. Blurred vision, left eye     PLAN:  LEFT EYE VISION ABNORMALITY --> possible refraction issue, but lens correction has been challenging; unclear onset timing, but suspect gradual rather than sudden onset - check MRI orbits, carotid u/s, labs to evaluate for vascular, autoimmune, inflamm etiologies  Orders Placed This Encounter  Procedures   MR ORBITS W WO CONTRAST   AChR Abs with Reflex to MuSK   Vitamin B12   TSH   VAS US CAROTID   Return for pending if symptoms worsen or fail to improve, pending test results.  I spent 60 minutes of face-to-face and non-face-to-face time with patient.  This included previsit chart review, lab review, study review, order entry, electronic health record documentation, patient education.     Penni Bombard, MD 29/03/2840, 3:24 PM Certified in Neurology, Neurophysiology and Neuroimaging  [facility] [address], [place], [place] [address] [phone]

## 2022-10-09 DIAGNOSIS — H10232 Serous conjunctivitis, except viral, left eye: Secondary | ICD-10-CM | POA: Diagnosis not present

## 2022-10-15 ENCOUNTER — Telehealth: Payer: Self-pay | Admitting: Diagnostic Neuroimaging

## 2022-10-15 NOTE — Telephone Encounter (Signed)
Pt is calling. Stated she is following up on MRI. Pt is requesting a call back.

## 2022-10-16 LAB — TSH: TSH: 1.33 u[IU]/mL (ref 0.450–4.500)

## 2022-10-16 LAB — MUSK ANTIBODIES: MuSK Antibodies: <1 U/mL

## 2022-10-16 LAB — VITAMIN B12: Vitamin B-12: 1812 pg/mL — ABNORMAL HIGH (ref 232–1245)

## 2022-10-16 LAB — ACHR ABS WITH REFLEX TO MUSK: AChR Binding Ab, Serum: <0.03 nmol/L (ref 0.00–0.24)

## 2022-10-16 NOTE — Telephone Encounter (Signed)
Pt said called insurance to check why have not been done, and they have not received anything from [place].  Pt would like MRI done in [facility] at  [facility] [address] Lorina Rabon Phone: [phone]

## 2022-10-16 NOTE — Telephone Encounter (Signed)
Morning Sun: #[id number] exp. 10/16/22-11/16/22 sent to [facility] told the patient to call the schedule

## 2022-10-16 NOTE — Telephone Encounter (Signed)
Pt checking on status of MRI. Would like a call back.

## 2022-10-18 IMAGING — MG MM DIGITAL SCREENING BILAT W/ TOMO AND CAD
8 series · 8 of 24 positions shown · non-contrast
Comparison: Previous exam(s).

CLINICAL DATA: Screening.

EXAM:
DIGITAL SCREENING BILATERAL MAMMOGRAM WITH TOMOSYNTHESIS AND CAD
TECHNIQUE: Bilateral screening digital craniocaudal and mediolateral oblique
mammograms were obtained. Bilateral screening digital breast
tomosynthesis was performed. The images were evaluated with
computer-aided detection.

[R MLO synth-2D]
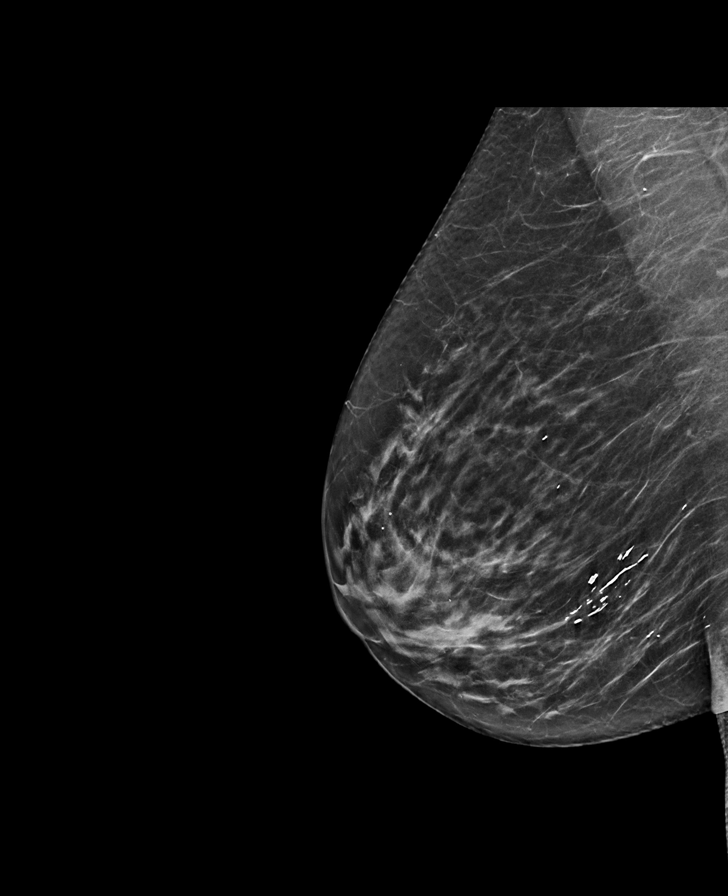

[L CC synth-2D]
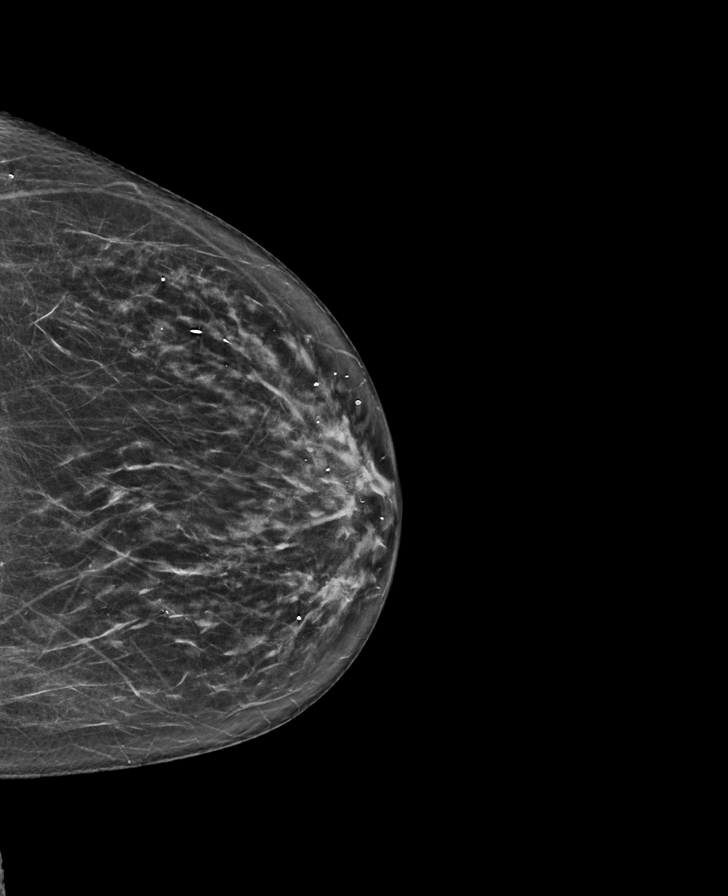

[R CC synth-2D]
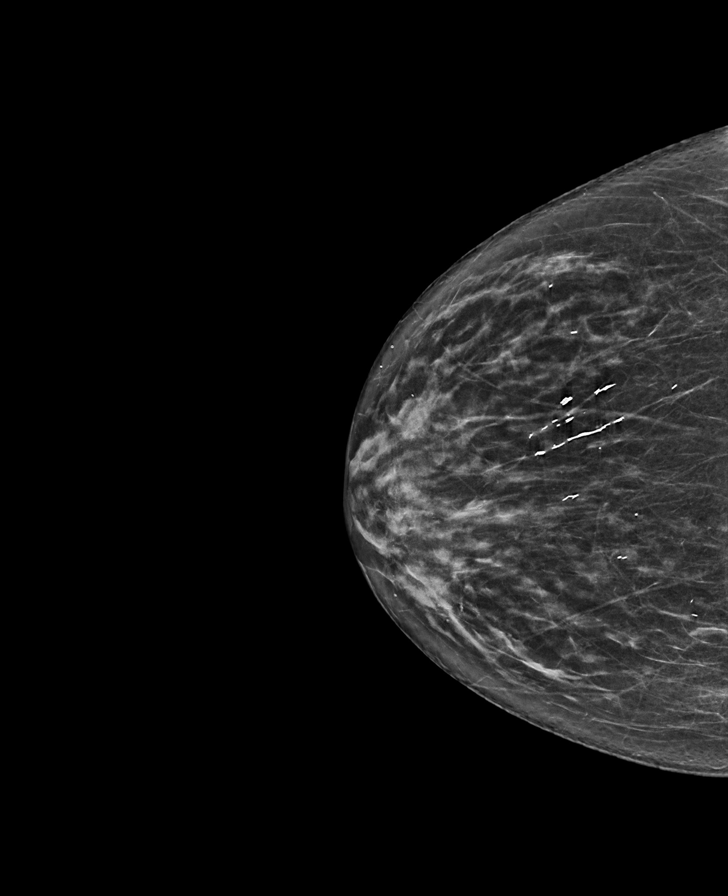

[L MLO synth-2D]
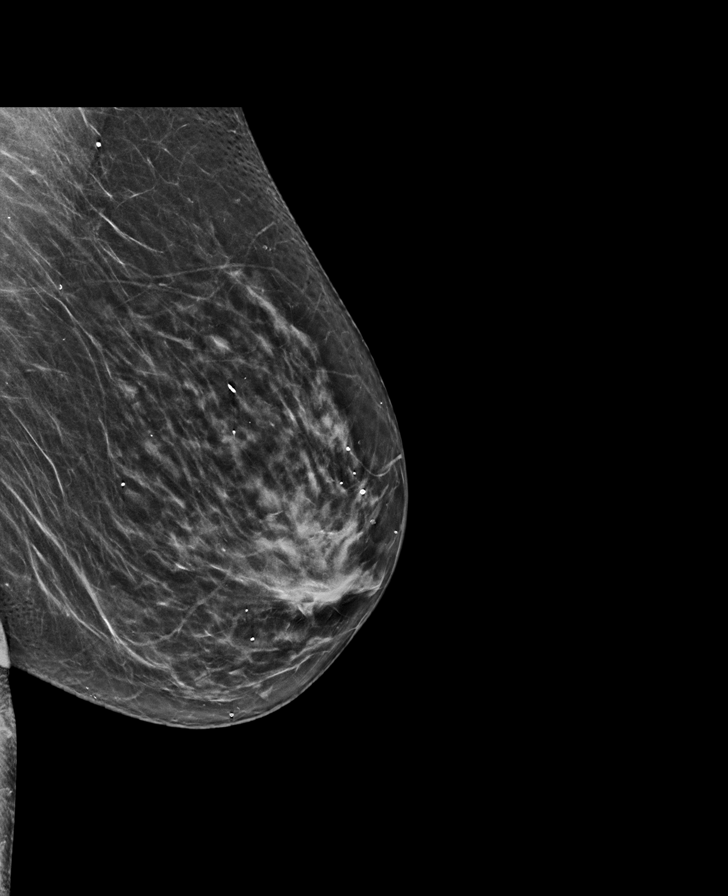

[R MLO tomo · tomo slice 33/66.0]
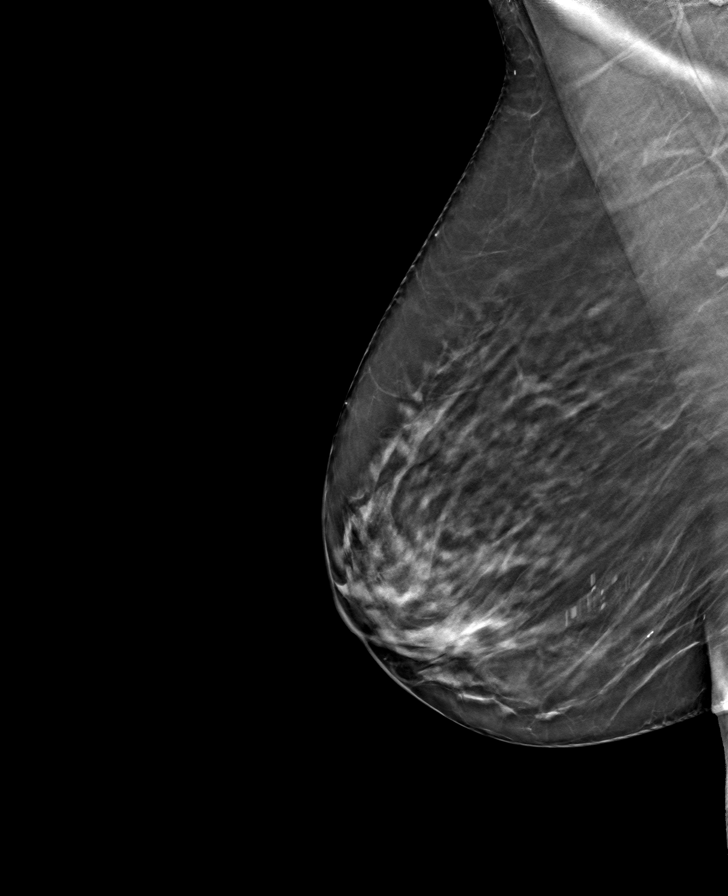

[L CC tomo · tomo slice 31/62.0]
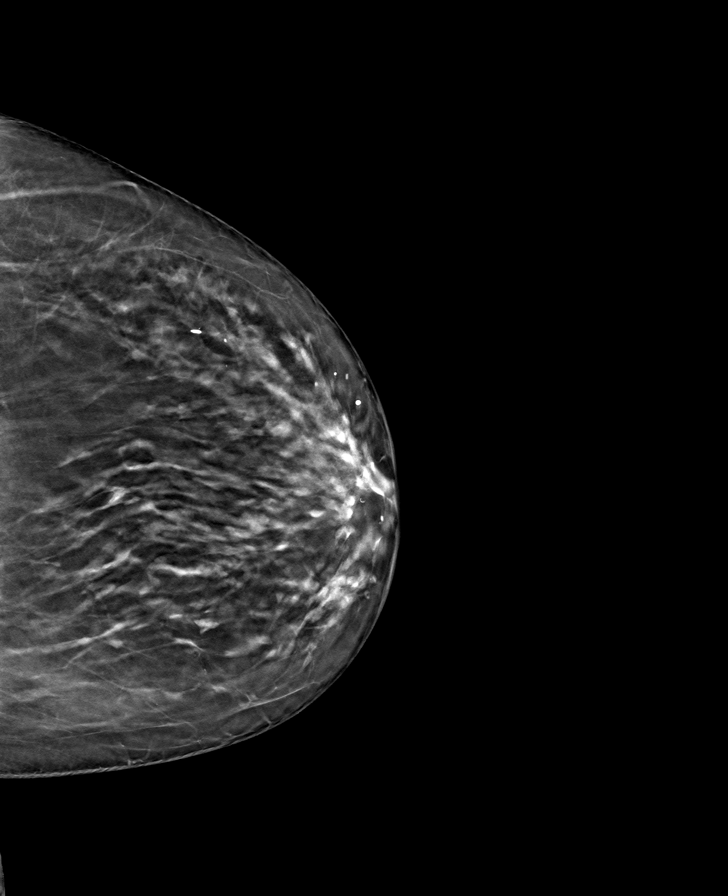

[R CC tomo · tomo slice 32/63.0]
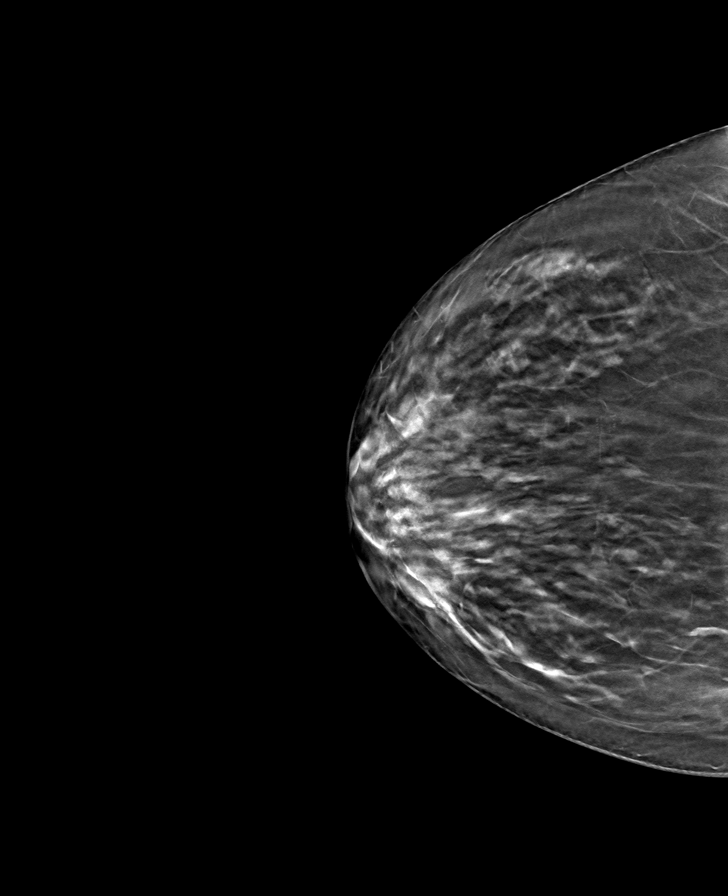

[L MLO tomo · tomo slice 33/64.0]
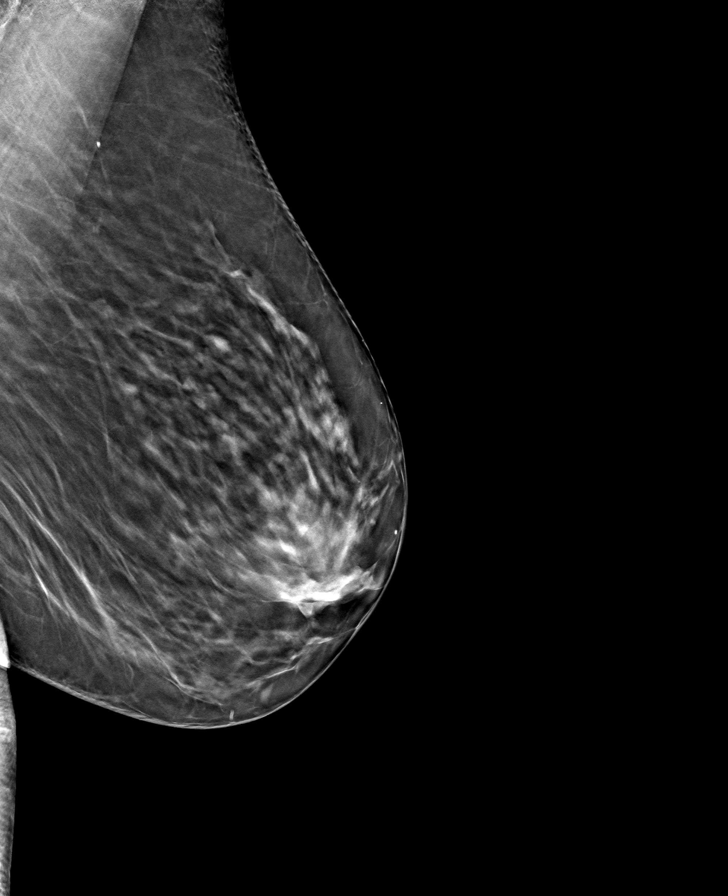

[8 of 24 positions shown; findings below may reference images not displayed]

ACR Breast Density Category c: The breast tissue is heterogeneously
dense, which may obscure small masses.
FINDINGS: There are no findings suspicious for malignancy.
IMPRESSION: No mammographic evidence of malignancy. A result letter of this
screening mammogram will be mailed directly to the patient.

RECOMMENDATION:
Screening mammogram in one year. (Code:Q3-W-BC3)

BI-RADS CATEGORY  1: Negative.

## 2022-10-22 ENCOUNTER — Telehealth: Payer: Self-pay

## 2022-10-22 NOTE — Telephone Encounter (Signed)
-----   Message from Penni Bombard, MD sent at 10/21/2022  6:38 PM EST ----- Normal labs. Please call patient. -VRP

## 2022-10-22 NOTE — Telephone Encounter (Signed)
Called patient and relayed that her lab work came back normal. Pt verbalized understanding. Pt had no questions at this time but was encouraged to call back if questions arise.

## 2022-11-01 DIAGNOSIS — H16142 Punctate keratitis, left eye: Secondary | ICD-10-CM | POA: Diagnosis not present

## 2022-11-25 ENCOUNTER — Encounter: Payer: Self-pay | Admitting: Student in an Organized Health Care Education/Training Program

## 2022-11-25 ENCOUNTER — Telehealth: Payer: Self-pay

## 2022-11-25 ENCOUNTER — Ambulatory Visit
Payer: No Typology Code available for payment source | Attending: Student in an Organized Health Care Education/Training Program | Admitting: Student in an Organized Health Care Education/Training Program

## 2022-11-25 VITALS — BP 149/88 | HR 86 | Temp 97.8°F | Resp 17 | Ht 66.0 in | Wt 125.0 lb

## 2022-11-25 DIAGNOSIS — M47818 Spondylosis without myelopathy or radiculopathy, sacral and sacrococcygeal region: Secondary | ICD-10-CM | POA: Insufficient documentation

## 2022-11-25 DIAGNOSIS — M533 Sacrococcygeal disorders, not elsewhere classified: Secondary | ICD-10-CM | POA: Diagnosis not present

## 2022-11-25 DIAGNOSIS — M47816 Spondylosis without myelopathy or radiculopathy, lumbar region: Secondary | ICD-10-CM | POA: Insufficient documentation

## 2022-11-25 DIAGNOSIS — G894 Chronic pain syndrome: Secondary | ICD-10-CM | POA: Diagnosis not present

## 2022-11-25 MED ORDER — OXYCODONE-ACETAMINOPHEN 7.5-325 MG PO TABS: 1.0000 | ORAL_TABLET | Freq: Two times a day (BID) | ORAL | 0 refills | Status: AC | PRN

## 2022-11-25 MED ORDER — DICLOFENAC SODIUM 50 MG PO TBEC: 50.0000 mg | DELAYED_RELEASE_TABLET | Freq: Two times a day (BID) | ORAL | 0 refills | Status: AC

## 2022-11-25 MED ORDER — KETOROLAC TROMETHAMINE 30 MG/ML IJ SOLN
30.0000 mg | Freq: Once | INTRAMUSCULAR | Status: AC
  Administered 2022-11-25: 30 mg via INTRAMUSCULAR
  Filled 2022-11-25: qty 1

## 2022-11-25 NOTE — Progress Notes (Signed)
Nursing Pain Medication Assessment:  Safety precautions to be maintained throughout the outpatient stay will include: orient to surroundings, keep bed in low position, maintain call bell within reach at all times, provide assistance with transfer out of bed and ambulation.  Medication Inspection Compliance: Pill count conducted under aseptic conditions, in front of the patient. Neither the pills nor the bottle was removed from the patient's sight at any time. Once count was completed pills were immediately returned to the patient in their original bottle.  Medication: Oxycodone/APAP Pill/Patch Count:  4 of 45 pills remain Pill/Patch Appearance: Markings consistent with prescribed medication Bottle Appearance: Standard pharmacy container. Clearly labeled. Filled Date: 07 / 17 / 2023 Last Medication intake:  Yesterday

## 2022-11-25 NOTE — Patient Instructions (Addendum)
Please schedule patient for 1/29 at 9 am

## 2022-11-25 NOTE — Progress Notes (Signed)
PROVIDER NOTE: Information contained herein reflects review and annotations entered in association with encounter. Interpretation of such information and data should be left to medically-trained personnel. Information provided to patient can be located elsewhere in the medical record under "Patient Instructions". Document created using STT-dictation technology, any transcriptional errors that may result from process are unintentional.    Patient: Teresa [name]  Service Category: E/M  Provider: Gillis Santa, MD  DOB: 16-Oct-1956  DOS: 11/25/2022  Referring Provider: Tracie Harrier, MD  MRN: [id number]  Specialty: Interventional Pain Management  PCP: Tracie Harrier, MD  Type: Established Patient  Setting: Ambulatory outpatient    Location: Office  Delivery: Face-to-face     HPI  [name], a 67 y.o. year old female, is here today because of her Lumbar facet arthropathy [M47.816]. Teresa [name] primary complain today is Back Pain (lower) Last encounter: My last encounter with her was on 07/24/2022. Pertinent problems: Teresa [name] has INSOMNIA, CHRONIC; DEPRESSION; BACK PAIN, CHRONIC; Lumbar radiculopathy (right L5); Lumbar facet arthropathy; Degeneration of lumbar or lumbosacral intervertebral disc; Chronic pain syndrome; Arthritis of left glenohumeral joint; and Left rotator cuff tear arthropathy on their pertinent problem list. Pain Assessment: Severity of Chronic pain is reported as a 9 /10. Location: Back Lower, Right/c/o right leg including toes and bottom of foot feel numb at times. Onset: More than a month ago. Quality: Aching, Throbbing, Numbness. Timing: Constant. Modifying factor(s): nothing. Vitals:  height is '5\' 6"'$  (1.676 m) and weight is 125 lb (56.7 kg). Her temporal temperature is 97.8 F (36.6 C). Her blood pressure is 149/88 (abnormal) and her pulse is 86. Her respiration is 17 and oxygen saturation is 98%.  BMI: Estimated body mass index is 20.18 kg/m as calculated from the  following:   Height as of this encounter: '5\' 6"'$  (1.676 m).   Weight as of this encounter: 125 lb (56.7 kg).  Reason for encounter:  Patient presents with severe right lower back pain and right SI joint pain.  She states that this pain started approximately last Thursday and has gotten worse.  She is having severe difficulty performing ADLs and is fairly uncomfortable.  She is accompanied today by her husband.  She has tenderness overlying her right L4-L5 facets and has tenderness to palpation overlying her right SI joint.  She is status post bilateral L3, L4, L5 facet medial branch nerve block on 08/19/2021 that provided her with greater than 75% pain relief for over 6 months for her axial low back pain related to facet arthropathy at that time.  She had a right SI joint injection done 07/17/2021 which was also helpful for her SI joint pain.  She is requesting repeat intervention.  She is also requesting a short supply of oxycodone to help with her acute pain.  I also recommend that she get a intramuscular Toradol shot today for her acute pain.  I will also prescribe p.o. diclofenac for her to take 50 mg twice daily after 5 days if she continues to have severe pain.  Pharmacotherapy Assessment  Analgesic: Percocet 7.5 mg every 12 hours as needed for severe pain for the next 14 days until patient is able to come in for injection of right L3, L4, L5 facets and right SI joint  Monitoring: [place] PMP: PDMP reviewed during this encounter.       Pharmacotherapy: No side-effects or adverse reactions reported. Compliance: No problems identified. Effectiveness: Clinically acceptable.  Rise Patience, RN  11/25/2022  1:20 PM  Sign  when Signing Visit Nursing Pain Medication Assessment:  Safety precautions to be maintained throughout the outpatient stay will include: orient to surroundings, keep bed in low position, maintain call bell within reach at all times, provide assistance with transfer out of bed and  ambulation.  Medication Inspection Compliance: Pill count conducted under aseptic conditions, in front of the patient. Neither the pills nor the bottle was removed from the patient's sight at any time. Once count was completed pills were immediately returned to the patient in their original bottle.  Medication: Oxycodone/APAP Pill/Patch Count:  4 of 45 pills remain Pill/Patch Appearance: Markings consistent with prescribed medication Bottle Appearance: Standard pharmacy container. Clearly labeled. Filled Date: 07 / 17 / 2023 Last Medication intake:  Yesterday    No results found for: "CBDTHCR" No results found for: "D8THCCBX" No results found for: "D9THCCBX"  UDS:  Summary  Date Value Ref Range Status  04/03/2020 Note  Final    Comment:    ==================================================================== [facility] Select 13 (MW) ==================================================================== Test                             Result       Flag       Units Drug Absent but Declared for Prescription Verification   Oxycodone                      Not Detected UNEXPECTED ng/mg creat ==================================================================== Test                      Result    Flag   Units      Ref Range   Creatinine              194              mg/dL      >=20 ==================================================================== Declared Medications:  The flagging and interpretation on this report are based on the  following declared medications.  Unexpected results may arise from  inaccuracies in the declared medications.  **Note: The testing scope of this panel includes these medications:  Oxycodone  **Note: The testing scope of this panel does not include the  following reported medications:  Acetaminophen  Alendronate (Fosamax)  Buspirone (Buspar)  Calcium  Cholecalciferol  Gabapentin (Neurontin)  Iron (Fergon)  Multivitamin  Nortriptyline (Pamelor)   Tizanidine (Zanaflex)  Topical  Topical Diclofenac  Topiramate (Topamax)  Trazodone (Desyrel)  Zolpidem (Ambien) ==================================================================== For clinical consultation, please call [phone]. ====================================================================       ROS  Constitutional: Denies any fever or chills Gastrointestinal: No reported hemesis, hematochezia, vomiting, or acute GI distress Musculoskeletal:  Right axial low back pain related to lumbar facets, right SI joint pain Neurological: No reported episodes of acute onset apraxia, aphasia, dysarthria, agnosia, amnesia, paralysis, loss of coordination, or loss of consciousness  Medication Review  Calcium Carb-Cholecalciferol, Inulin, Vitamin D3, acetaminophen, alendronate, aspirin-acetaminophen-caffeine, cyanocobalamin, diclofenac, dicyclomine, ferrous gluconate, gabapentin, nortriptyline, oxyCODONE-acetaminophen, pantoprazole, tiZANidine, topiramate, vitamin C, and zolpidem  History Review  Allergy: Teresa [name] is allergic to elastic bandages & [zinc] and morphine. Drug: Teresa [name]  reports no history of drug use. Alcohol:  reports no history of alcohol use. Tobacco:  reports that she has never smoked. She has never used smokeless tobacco. Social: Teresa [name]  reports that she has never smoked. She has never used smokeless tobacco. She reports that she does not drink alcohol and does not use  drugs. Medical:  has a past medical history of Dyspnea, Headache, History of fractured vertebra (1982), Hyperlipidemia, Low bone density, Melanoma ([place]), and Transfusion of blood product refused for religious reason. Surgical: Teresa [name]  has a past surgical history that includes Abdominal hysterectomy; Colonoscopy (11/03/1998); Eye surgery (Bilateral, 11/03/2012); Artery Biopsy (Left, 12/18/2015); melanoma excisison (Right); and Cholecystectomy. Family: family history includes Dementia in her  mother; Heart attack in her father.  Laboratory Chemistry Profile   Renal Lab Results  Component Value Date   BUN 10 07/14/2022   CREATININE 0.70 07/14/2022   GFRAA >90 08/14/2014   GFRNONAA >60 07/14/2022    Hepatic Lab Results  Component Value Date   AST 14 (L) 07/13/2022   ALT 16 07/13/2022   ALBUMIN 3.6 07/13/2022   ALKPHOS 44 07/13/2022   LIPASE 141 (H) 07/13/2022    Electrolytes Lab Results  Component Value Date   NA 144 07/14/2022   K 3.5 07/14/2022   CL 120 (H) 07/14/2022   CALCIUM 8.0 (L) 07/14/2022   MG 2.1 07/13/2022    Bone No results found for: "VD25OH", "VD125OH2TOT", "SA6301SW1", "UX3235TD3", "25OHVITD1", "25OHVITD2", "25OHVITD3", "TESTOFREE", "TESTOSTERONE"  Inflammation (CRP: Acute Phase) (ESR: Chronic Phase) Lab Results  Component Value Date   LATICACIDVEN 1.1 07/13/2022         Note: Above Lab results reviewed.  Recent Imaging Review  MR BRAIN W WO CONTRAST CLINICAL DATA:  Provided history: Sudden visual loss of left eye. History of recent fall.  EXAM: MRI HEAD WITHOUT AND WITH CONTRAST  TECHNIQUE: Multiplanar, multiecho pulse sequences of the brain and surrounding structures were obtained without and with intravenous contrast.  CONTRAST:  7.55m GADAVIST GADOBUTROL 1 MMOL/ML IV SOLN  COMPARISON:  Brain MRI 03/04/2016. CT angiogram head 08/14/2014.  FINDINGS: Brain:  No age advanced or lobar predominant parenchymal atrophy.  No cortical encephalomalacia is identified. No significant cerebral white matter disease.  There is no acute infarct.  No evidence of an intracranial mass.  No chronic intracranial blood products.  No extra-axial fluid collection.  No midline shift.  No pathologic intracranial enhancement identified.  Vascular: Maintained flow voids within the proximal large arterial vessels. 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  Skull and upper cervical spine: No focal suspicious  marrow lesion.  Sinuses/Orbits: No mass or acute finding within the imaged orbits. Prior bilateral ocular lens replacement. No significant paranasal sinus disease.  IMPRESSION: Unremarkable MRI appearance of the brain. No evidence of acute intracranial abnormality.  Electronically Signed   By: KKellie SimmeringD.O.   On: 08/11/2022 08:12 Note: Reviewed        Physical Exam  General appearance: Well nourished, well developed, and well hydrated. In no apparent acute distress Mental status: Alert, oriented x 3 (person, place, & time)       Respiratory: No evidence of acute respiratory distress Eyes: PERLA Vitals: BP (!) 149/88   Pulse 86   Temp 97.8 F (36.6 C) (Temporal)   Resp 17   Ht '5\' 6"'$  (1.676 m)   Wt 125 lb (56.7 kg)   SpO2 98%   BMI 20.18 kg/m  BMI: Estimated body mass index is 20.18 kg/m as calculated from the following:   Height as of this encounter: '5\' 6"'$  (1.676 m).   Weight as of this encounter: 125 lb (56.7 kg). Ideal: Ideal body weight: 59.3 kg (130 lb 11.7 oz)  Lumbar Spine Area Exam  Skin & Axial Inspection: No masses, redness, or swelling Alignment: Symmetrical Functional ROM: Pain  restricted ROM affecting primarily the right Stability: No instability detected Muscle Tone/Strength: Functionally intact. No obvious neuro-muscular anomalies detected. Sensory (Neurological): Musculoskeletal pain pattern, facet mediated Right L3,4,5  Palpation: Complains of area being tender to palpation       Provocative Tests:  Lumbar quadrant test (Kemp's test): (+) on the right for facet joint pain.  Patrick's Maneuver: (+) for right-sided S-I arthralgia             FABER* test: (+) for right-sided S-I arthralgia             S-I anterior distraction/compression test: (+) for right-sided S-I arthralgia             S-I lateral compression test: (+) for right-sided S-I arthralgia             S-I Thigh-thrust test: (+) for right-sided S-I arthralgia             S-I Gaenslen's  test: (+) for right-sided S-I arthralgia              Gait & Posture Assessment  Ambulation: Unassisted Gait: Antalgic gait (limping) Posture: Difficulty standing up straight, due to pain  Lower Extremity Exam    Side: Right lower extremity  Side: Left lower extremity  Stability: No instability observed          Stability: No instability observed          Skin & Extremity Inspection: Skin color, temperature, and hair growth are WNL. No peripheral edema or cyanosis. No masses, redness, swelling, asymmetry, or associated skin lesions. No contractures.  Skin & Extremity Inspection: Skin color, temperature, and hair growth are WNL. No peripheral edema or cyanosis. No masses, redness, swelling, asymmetry, or associated skin lesions. No contractures.  Functional ROM: Pain restricted ROM for hip joint and SI-J          Functional ROM: Unrestricted ROM                  Muscle Tone/Strength: Functionally intact. No obvious neuro-muscular anomalies detected.  Muscle Tone/Strength: Functionally intact. No obvious neuro-muscular anomalies detected.  Sensory (Neurological): Musculoskeletal pain pattern        Sensory (Neurological): Unimpaired        DTR: Patellar: deferred today Achilles: deferred today Plantar: deferred today  DTR: Patellar: deferred today Achilles: deferred today Plantar: deferred today  Palpation: No palpable anomalies  Palpation: No palpable anomalies    Assessment   Diagnosis Status  1. Lumbar facet arthropathy (L3/4 and L4/5)   2. SI joint arthritis   3. Sacroiliac joint pain   4. Chronic pain syndrome    Having a Flare-up Having a Flare-up Having a Flare-up     Plan of Care    Teresa [name] has a current medication list which includes the following long-term medication(s): calcium carb-cholecalciferol, dicyclomine, ferrous gluconate, topiramate, zolpidem, and pantoprazole.  1. Lumbar facet arthropathy (L3/4 and L4/5) - LUMBAR FACET(MEDIAL BRANCH NERVE  BLOCK) MBNB; Future  2. SI joint arthritis - SACROILIAC JOINT INJECTION; Future  3. Sacroiliac joint pain - SACROILIAC JOINT INJECTION; Future  4. Chronic pain syndrome - LUMBAR FACET(MEDIAL BRANCH NERVE BLOCK) MBNB; Future - SACROILIAC JOINT INJECTION; Future - ketorolac (TORADOL) 30 MG/ML injection 30 mg - diclofenac (VOLTAREN) 50 MG EC tablet; Take 1 tablet (50 mg total) by mouth 2 (two) times daily.  Dispense: 60 tablet; Refill: 0 - oxyCODONE-acetaminophen (PERCOCET) 7.5-325 MG tablet; Take 1 tablet by mouth every 12 (twelve) hours as needed  for up to 15 days for moderate pain or severe pain.  Dispense: 30 tablet; Refill: 0    Pharmacotherapy (Medications Ordered): Meds ordered this encounter  Medications   ketorolac (TORADOL) 30 MG/ML injection 30 mg   diclofenac (VOLTAREN) 50 MG EC tablet    Sig: Take 1 tablet (50 mg total) by mouth 2 (two) times daily.    Dispense:  60 tablet    Refill:  0   oxyCODONE-acetaminophen (PERCOCET) 7.5-325 MG tablet    Sig: Take 1 tablet by mouth every 12 (twelve) hours as needed for up to 15 days for moderate pain or severe pain.    Dispense:  30 tablet    Refill:  0    Chronic Pain: STOP Act (Not applicable) Fill 1 day early if closed on refill date. Avoid benzodiazepines within 8 hours of opioids   Orders:  Orders Placed This Encounter  Procedures   LUMBAR FACET(MEDIAL BRANCH NERVE BLOCK) MBNB    Standing Status:   Future    Standing Expiration Date:   02/24/2023    Scheduling Instructions:     Procedure: Lumbar facet block (AKA.: Lumbosacral medial branch nerve block)     Side: Right     Level: L3-4 & L5-S1 Facets (L3, L4, L5,  Medial Branch Nerves)     Sedation: local     Timeframe: ASAA    Order Specific Question:   Where will this procedure be performed?    Answer:   [facility]   SACROILIAC JOINT INJECTION    Standing Status:   Future    Standing Expiration Date:   02/24/2023    Scheduling Instructions:     Side: RIGHT      Sedation: without     Timeframe: ASAP    Order Specific Question:   Where will this procedure be performed?    Answer:   [facility]   Follow-up plan:   Return in about 1 week (around 12/02/2022) for Right L3, 4, 5 MBNB + Right SI-J, in clinic NS.     Status post left L3, L4, L5, S1 RFA on 06/15/2019, R L3,4,5, S1 RFA on 06/29/2019, left posterior glenohumeral shoulder steroid injection 01/02/2020: Not effective.  Left L4 and Sprint peripheral nerve stimulation on 04/25/2020, removed 06/11/2020.  Left suprascapular nerve block 07/01/2021              Recent Visits No visits were found meeting these conditions. Showing recent visits within past 90 days and meeting all other requirements Today's Visits Date Type Provider Dept  11/25/22 Office Visit Gillis Santa, MD [facility]  Showing today's visits and meeting all other requirements Future Appointments No visits were found meeting these conditions. Showing future appointments within next 90 days and meeting all other requirements  I discussed the assessment and treatment plan with the patient. The patient was provided an opportunity to ask questions and all were answered. The patient agreed with the plan and demonstrated an understanding of the instructions.  Patient advised to call back or seek an in-person evaluation if the symptoms or condition worsens.  Duration of encounter: 44mnutes.  Total time on encounter, as per AMA guidelines included both the face-to-face and non-face-to-face time personally spent by the physician and/or other qualified health care professional(s) on the day of the encounter (includes time in activities that require the physician or other qualified health care professional and does not include time in activities normally performed by clinical staff). Physician's time may include  the following activities when performed: Preparing to see the patient (e.g., pre-charting review of records,  searching for previously ordered imaging, lab work, and nerve conduction tests) Review of prior analgesic pharmacotherapies. Reviewing PMP Interpreting ordered tests (e.g., lab work, imaging, nerve conduction tests) Performing post-procedure evaluations, including interpretation of diagnostic procedures Obtaining and/or reviewing separately obtained history Performing a medically appropriate examination and/or evaluation Counseling and educating the patient/family/caregiver Ordering medications, tests, or procedures Referring and communicating with other health care professionals (when not separately reported) Documenting clinical information in the electronic or other health record Independently interpreting results (not separately reported) and communicating results to the patient/ family/caregiver Care coordination (not separately reported)  Note by: Gillis Santa, MD Date: 11/25/2022; Time: 1:57 PM

## 2022-11-25 NOTE — Telephone Encounter (Signed)
My chart message sent re elevators 

## 2022-11-26 ENCOUNTER — Ambulatory Visit
Payer: No Typology Code available for payment source | Attending: Student in an Organized Health Care Education/Training Program | Admitting: Student in an Organized Health Care Education/Training Program

## 2022-11-26 ENCOUNTER — Ambulatory Visit
Admission: RE | Admit: 2022-11-26 | Discharge: 2022-11-26 | Disposition: A | Discharge status: Home / Self Care | Payer: No Typology Code available for payment source | Source: Ambulatory Visit | Attending: Student in an Organized Health Care Education/Training Program | Admitting: Student in an Organized Health Care Education/Training Program

## 2022-11-26 ENCOUNTER — Encounter: Payer: Self-pay | Admitting: Student in an Organized Health Care Education/Training Program

## 2022-11-26 VITALS — BP 113/72 | HR 81 | Temp 97.6°F | Resp 18 | Ht 66.0 in | Wt 125.0 lb

## 2022-11-26 DIAGNOSIS — M545 Low back pain, unspecified: Secondary | ICD-10-CM | POA: Diagnosis not present

## 2022-11-26 DIAGNOSIS — M47818 Spondylosis without myelopathy or radiculopathy, sacral and sacrococcygeal region: Secondary | ICD-10-CM | POA: Diagnosis not present

## 2022-11-26 DIAGNOSIS — M47816 Spondylosis without myelopathy or radiculopathy, lumbar region: Secondary | ICD-10-CM | POA: Diagnosis not present

## 2022-11-26 DIAGNOSIS — G894 Chronic pain syndrome: Secondary | ICD-10-CM | POA: Insufficient documentation

## 2022-11-26 DIAGNOSIS — M533 Sacrococcygeal disorders, not elsewhere classified: Secondary | ICD-10-CM | POA: Diagnosis not present

## 2022-11-26 MED ORDER — DEXAMETHASONE SODIUM PHOSPHATE 10 MG/ML IJ SOLN
INTRAMUSCULAR | Status: AC
  Filled 2022-11-26: qty 1

## 2022-11-26 MED ORDER — METHYLPREDNISOLONE ACETATE 40 MG/ML IJ SUSP
INTRAMUSCULAR | Status: AC
  Filled 2022-11-26: qty 1

## 2022-11-26 MED ORDER — ROPIVACAINE HCL 2 MG/ML IJ SOLN
INTRAMUSCULAR | Status: AC
  Filled 2022-11-26: qty 20

## 2022-11-26 MED ORDER — METHYLPREDNISOLONE ACETATE 80 MG/ML IJ SUSP
INTRAMUSCULAR | Status: AC
  Filled 2022-11-26: qty 1

## 2022-11-26 MED ORDER — DICLOFENAC EPOLAMINE 1.3 % EX PTCH: 1.0000 | MEDICATED_PATCH | Freq: Two times a day (BID) | CUTANEOUS | 0 refills | Status: AC

## 2022-11-26 MED ORDER — DEXAMETHASONE SODIUM PHOSPHATE 10 MG/ML IJ SOLN
10.0000 mg | Freq: Once | INTRAMUSCULAR | Status: AC
  Administered 2022-11-26: 10 mg

## 2022-11-26 MED ORDER — LIDOCAINE HCL 2 % IJ SOLN
20.0000 mL | Freq: Once | INTRAMUSCULAR | Status: AC
  Administered 2022-11-26: 100 mg

## 2022-11-26 MED ORDER — METHYLPREDNISOLONE ACETATE 40 MG/ML IJ SUSP
40.0000 mg | Freq: Once | INTRAMUSCULAR | Status: AC
  Administered 2022-11-26: 40 mg

## 2022-11-26 MED ORDER — LIDOCAINE HCL (PF) 2 % IJ SOLN
INTRAMUSCULAR | Status: AC
  Filled 2022-11-26: qty 10

## 2022-11-26 MED ORDER — ROPIVACAINE HCL 2 MG/ML IJ SOLN
9.0000 mL | Freq: Once | INTRAMUSCULAR | Status: AC
  Administered 2022-11-26: 9 mL via PERINEURAL

## 2022-11-26 MED ORDER — IOHEXOL 180 MG/ML  SOLN
10.0000 mL | Freq: Once | INTRAMUSCULAR | Status: AC
  Administered 2022-11-26: 10 mL via EPIDURAL
  Filled 2022-11-26: qty 20

## 2022-11-26 NOTE — Progress Notes (Signed)
PROVIDER NOTE: Interpretation of information contained herein should be left to medically-trained personnel. Specific patient instructions are provided elsewhere under "Patient Instructions" section of medical record. This document was created in part using STT-dictation technology, any transcriptional errors that may result from this process are unintentional.  Patient: Teresa [name] Type: Established DOB: [birth year] MRN: [id number] PCP: Tracie Harrier, MD  Service: Procedure DOS: 11/26/2022 Setting: Ambulatory Location: Ambulatory outpatient facility Delivery: Face-to-face Provider: Gillis Santa, MD Specialty: Interventional Pain Management Specialty designation: 09 Location: Outpatient facility Ref. Prov.: Gillis Santa, MD    Primary Reason for Visit: Interventional Pain Management Treatment. CC: Back Pain (lower)   Interventional Therapy   Procedure No.1: Lumbar Facet, Medial Branch Block(s) Laterality: Right  Level(s): L3, L4, L5, Medial Branch Level(s).  Rationale: Injecting these levels should provide temporary sensory nerve conduction blockade of the L3-4 and L4-5  lumbar facet (zygapophyseal) joints. Target: Terminal medial branch nerve division of lumbosacral nerve root dorsal rami.  Procedure No.2: Sacroiliac joint injection  Laterality: Right     Approach: Inferior postero-medial percutaneous approach. Level: PIIS (Posterior Inferior Iliac Spine) Sacroiliac Joint Target: For lower sacroiliac joint block(s), the target is the inferior and posterior margin of the sacroiliac joint.  Imaging: Fluoroscopic guidance         Anesthesia: Local anesthesia (1-2% Lidocaine)  DOS: 11/26/2022 Performed by: Gillis Santa, MD  Purpose: Therapeutic/Palliative Indications: Low back pain severe enough to impact quality of life or function. Medical necessity rationale: procedure needed and proper for the diagnosis and/or treatment of the patient's medical symptoms and needs. 1. SI  joint arthritis   2. Lumbar facet arthropathy (L3/4 and L4/5)   3. Chronic pain syndrome   4. Sacroiliac joint pain   5. Lumbar facet arthropathy    NAS-11 Pain score:   Pre-procedure: 10-Worst pain ever/10   Post-procedure: 10-Worst pain ever/10     Position / Prep / Materials:  Position: Prone  Prep solution: DuraPrep (Iodine Povacrylex [0.7% available iodine] and Isopropyl Alcohol, 74% w/w) Area Prepped: Posterolateral Lumbosacral Spine (Wide prep: From the lower border of the scapula down to the end of the tailbone and from flank to flank.)  Materials for procedure No.1:  Tray: Block Needle(s):  Type: Spinal  Gauge (G): 22  Length: 5-in Qty: 2  Materials for procedure No.2:  Tray: Block Needle(s):  Type: Spinal  Gauge (G): 22  Length: 3.5-in Qty: 1  Pre-op H&P Assessment:  [name] is a 67 y.o. (year old), female patient, seen today for interventional treatment. She  has a past surgical history that includes Abdominal hysterectomy; Colonoscopy (11/03/1998); Eye surgery (Bilateral, 11/03/2012); Artery Biopsy (Left, 12/18/2015); melanoma excisison (Right); and Cholecystectomy. [name] has a current medication list which includes the following prescription(s): acetaminophen, alendronate, vitamin c, aspirin-acetaminophen-caffeine, calcium carb-cholecalciferol, vitamin d3, cvs vitamin b12, diclofenac, diclofenac, dicyclomine, ferrous gluconate, gabapentin, inulin, nortriptyline, oxycodone-acetaminophen, tizanidine, topiramate, zolpidem, and pantoprazole. Her primarily concern today is the Back Pain (lower)  Initial Vital Signs:  Pulse/HCG Rate: 81ECG Heart Rate: 79 Temp: 97.6 F (36.4 C) Resp: 18 BP: (!) 144/86 SpO2: 90 %  BMI: Estimated body mass index is 20.18 kg/m as calculated from the following:   Height as of this encounter: '5\' 6"'$  (1.676 m).   Weight as of this encounter: 125 lb (56.7 kg).  Risk Assessment: Allergies: Reviewed. She is allergic to elastic  bandages & [zinc] and morphine.  Allergy Precautions: None required Coagulopathies: Reviewed. None identified.  Blood-thinner therapy: None at this time Active Infection(s):  Reviewed. None identified. [name] is afebrile  Site Confirmation: Ms. Patella was asked to confirm the procedure and laterality before marking the site Procedure checklist: Completed Consent: Before the procedure and under the influence of no sedative(s), amnesic(s), or anxiolytics, the patient was informed of the treatment options, risks and possible complications. To fulfill our ethical and legal obligations, as recommended by the American Medical Association's Code of Ethics, I have informed the patient of my clinical impression; the nature and purpose of the treatment or procedure; the risks, benefits, and possible complications of the intervention; the alternatives, including doing nothing; the risk(s) and benefit(s) of the alternative treatment(s) or procedure(s); and the risk(s) and benefit(s) of doing nothing. The patient was provided information about the general risks and possible complications associated with the procedure. These may include, but are not limited to: failure to achieve desired goals, infection, bleeding, organ or nerve damage, allergic reactions, paralysis, and death. In addition, the patient was informed of those risks and complications associated to Spine-related procedures, such as failure to decrease pain; infection (i.e.: Meningitis, epidural or intraspinal abscess); bleeding (i.e.: epidural hematoma, subarachnoid hemorrhage, or any other type of intraspinal or peri-dural bleeding); organ or nerve damage (i.e.: Any type of peripheral nerve, nerve root, or spinal cord injury) with subsequent damage to sensory, motor, and/or autonomic systems, resulting in permanent pain, numbness, and/or weakness of one or several areas of the body; allergic reactions; (i.e.: anaphylactic reaction); and/or  death. Furthermore, the patient was informed of those risks and complications associated with the medications. These include, but are not limited to: allergic reactions (i.e.: anaphylactic or anaphylactoid reaction(s)); adrenal axis suppression; blood sugar elevation that in diabetics may result in ketoacidosis or comma; water retention that in patients with history of congestive heart failure may result in shortness of breath, pulmonary edema, and decompensation with resultant heart failure; weight gain; swelling or edema; medication-induced neural toxicity; particulate matter embolism and blood vessel occlusion with resultant organ, and/or nervous system infarction; and/or aseptic necrosis of one or more joints. Finally, the patient was informed that Medicine is not an exact science; therefore, there is also the possibility of unforeseen or unpredictable risks and/or possible complications that may result in a catastrophic outcome. The patient indicated having understood very clearly. We have given the patient no guarantees and we have made no promises. Enough time was given to the patient to ask questions, all of which were answered to the patient's satisfaction. Ms. Spillers has indicated that she wanted to continue with the procedure. Attestation: I, the ordering provider, attest that I have discussed with the patient the benefits, risks, side-effects, alternatives, likelihood of achieving goals, and potential problems during recovery for the procedure that I have provided informed consent. Date  Time: 11/26/2022  2:09 PM  Pre-Procedure Preparation:  Monitoring: As per clinic protocol. Respiration, ETCO2, SpO2, BP, heart rate and rhythm monitor placed and checked for adequate function Safety Precautions: Patient was assessed for positional comfort and pressure points before starting the procedure. Time-out: I initiated and conducted the "Time-out" before starting the procedure, as per protocol. The  patient was asked to participate by confirming the accuracy of the "Time Out" information. Verification of the correct person, site, and procedure were performed and confirmed by me, the nursing staff, and the patient. "Time-out" conducted as per Joint Commission's Universal Protocol (UP.01.01.01). Time: 1430  Description of Procedure:          Procedure No.1: Safety Precautions: Aspiration looking for blood return was conducted  prior to all injections. At no point did we inject any substances, as a needle was being advanced. Before injecting, the patient was told to immediately notify me if she was experiencing any new onset of "ringing in the ears, or metallic taste in the mouth". No attempts were made at seeking any paresthesias. Safe injection practices and needle disposal techniques used. Medications properly checked for expiration dates. SDV (single dose vial) medications used. After the completion of the procedure, all disposable equipment used was discarded in the proper designated medical waste containers. Local Anesthesia: Protocol guidelines were followed. The patient was positioned over the fluoroscopy table. The area was prepped in the usual manner. The time-out was completed. The target area was identified using fluoroscopy. A 12-in long, straight, sterile hemostat was used with fluoroscopic guidance to locate the targets for each level blocked. Once located, the skin was marked with an approved surgical skin marker. Once all sites were marked, the skin (epidermis, dermis, and hypodermis), as well as deeper tissues (fat, connective tissue and muscle) were infiltrated with a small amount of a short-acting local anesthetic, loaded on a 10cc syringe with a 25G, 1.5-in  Needle. An appropriate amount of time was allowed for local anesthetics to take effect before proceeding to the next step. Local Anesthetic: Lidocaine 2.0% The unused portion of the local anesthetic was discarded in the proper  designated containers. Technical description of process:   L3 Medial Branch Nerve Block (MBB): The target area for the L3 medial branch is at the junction of the postero-lateral aspect of the superior articular process and the superior, posterior, and medial edge of the transverse process of L4. Under fluoroscopic guidance, a Quincke needle was inserted until contact was made with os over the superior postero-lateral aspect of the pedicular shadow (target area). After negative aspiration for blood, 66m of the nerve block solution was injected without difficulty or complication. The needle was removed intact. L4 Medial Branch Nerve Block (MBB): The target area for the L4 medial branch is at the junction of the postero-lateral aspect of the superior articular process and the superior, posterior, and medial edge of the transverse process of L5. Under fluoroscopic guidance, a Quincke needle was inserted until contact was made with os over the superior postero-lateral aspect of the pedicular shadow (target area). After negative aspiration for blood, 234mof the nerve block solution was injected without difficulty or complication. The needle was removed intact. L5 Medial Branch Nerve Block (MBB): The target area for the L5 medial branch is at the junction of the postero-lateral aspect of the superior articular process and the superior, posterior, and medial edge of the sacral ala. Under fluoroscopic guidance, a Quincke needle was inserted until contact was made with os over the superior postero-lateral aspect of the pedicular shadow (target area). After negative aspiration for blood, 49m67mf the nerve block solution was injected without difficulty or complication. The needle was removed intact.   6 cc solution made of 5 cc of 0.2% ropivacaine, 1 cc of Decadron 10 mg/cc.  2 cc injected at each level above on the right  .        Illustration of the posterior view of the lumbar spine and the posterior neural  structures. Laminae of L2 through S1 are labeled. DPRL5, dorsal primary ramus of L5; DPRS1, dorsal primary ramus of S1; DPR3, dorsal primary ramus of L3; FJ, facet (zygapophyseal) joint L3-L4; I, inferior articular process of L4; LB1, lateral branch of dorsal primary ramus of  L1; IAB, inferior articular branches from L3 medial branch (supplies L4-L5 facet joint); IBP, intermediate branch plexus; MB3, medial branch of dorsal primary ramus of L3; NR3, third lumbar nerve root; S, superior articular process of L5; SAB, superior articular branches from L4 (supplies L4-5 facet joint also); TP3, transverse process of L3.  Procedure No.2: Safety Precautions: see above. Technical description of procedure: Protocol guidelines were followed. The patient was placed in position over the fluoroscopy table. The target area was identified and the area prepped in the usual manner. Skin & deeper tissues infiltrated with local anesthetic. Appropriate amount of time allowed to pass for local anesthetics to take effect. The procedure needle was advanced under fluoroscopic guidance into the sacroiliac joint until a firm endpoint was obtained. Proper needle placement secured. Negative aspiration confirmed. Solution injected in intermittent fashion, asking for systemic symptoms every 0.5cc of injectate. The needles were then removed and the area cleansed, making sure to leave some of the prepping solution back to take advantage of its long term bactericidal properties.   -illustration of inferior approach to sacroiliac joint.  5 cc solution made of 4 cc of 0.2% ropivacaine, 1 cc of methylprednisolone, 40 mg/cc.  Injected into the right SI joint after contrast confirmation  Once the entire procedure was completed, the treated area was cleaned, making sure to leave some of the prepping solution back to take advantage of its long term bactericidal properties.  Vitals:   11/26/22 1410 11/26/22 1430 11/26/22 1435  BP: (!) 144/86  (!) 143/82 113/72  Pulse: 81    Resp: '18 18 18  '$ Temp: 97.6 F (36.4 C)    TempSrc: Temporal    SpO2: 90% 98% 96%  Weight: 125 lb (56.7 kg)    Height: '5\' 6"'$  (1.676 m)       Start Time: 1430 hrs. End Time: 1435 hrs.  Imaging Guidance (Spinal):          Type of Imaging Technique: Fluoroscopy Guidance (Spinal) Indication(s): Assistance in needle guidance and placement for procedures requiring needle placement in or near specific anatomical locations not easily accessible without such assistance. Exposure Time: Please see nurses notes. Contrast: Before injecting any contrast, we confirmed that the patient did not have an allergy to iodine, shellfish, or radiological contrast. Once satisfactory needle placement was completed at the desired level, radiological contrast was injected. Contrast injected under live fluoroscopy. No contrast complications. See chart for type and volume of contrast used. Fluoroscopic Guidance: I was personally present during the use of fluoroscopy. "Tunnel Vision Technique" used to obtain the best possible view of the target area. Parallax error corrected before commencing the procedure. "Direction-depth-direction" technique used to introduce the needle under continuous pulsed fluoroscopy. Once target was reached, antero-posterior, oblique, and lateral fluoroscopic projection used confirm needle placement in all planes. Images permanently stored in EMR. Interpretation: I personally interpreted the imaging intraoperatively. Adequate needle placement confirmed in multiple planes. Appropriate spread of contrast into desired area was observed. No evidence of afferent or efferent intravascular uptake. No intrathecal or subarachnoid spread observed. Permanent images saved into the patient's record.  Antibiotic Prophylaxis:   Anti-infectives (From admission, onward)    None      Indication(s): None identified  Post-operative Assessment:  Post-procedure Vital Signs:   Pulse/HCG Rate: 8178 Temp: 97.6 F (36.4 C) Resp: 18 BP: 113/72 SpO2: 96 %  EBL: None  Complications: No immediate post-treatment complications observed by team, or reported by patient.  Note: The patient tolerated the entire procedure well.  A repeat set of vitals were taken after the procedure and the patient was kept under observation following institutional policy, for this type of procedure. Post-procedural neurological assessment was performed, showing return to baseline, prior to discharge. The patient was provided with post-procedure discharge instructions, including a section on how to identify potential problems. Should any problems arise concerning this procedure, the patient was given instructions to immediately contact us, at any time, without hesitation. In any case, we plan to contact the patient by telephone for a follow-up status report regarding this interventional procedure.  Comments:  No additional relevant information.  Plan of Care  Orders:  Orders Placed This Encounter  Procedures   DG PAIN CL[facility] C-ARM 1-60 MIN NO REPORT    Intraoperative interpretation by procedural physician at [place].    Standing Status:   Standing    Number of Occurrences:   1    Order Specific Question:   Reason for exam:    Answer:   Assistance in needle guidance and placement for procedures requiring needle placement in or near specific anatomical locations not easily accessible without such assistance.     Medications ordered for procedure: Meds ordered this encounter  Medications   iohexol (OMNIPAQUE) 180 MG/ML injection 10 mL    Must be Myelogram-compatible. If not available, you may substitute with a water-soluble, non-ionic, hypoallergenic, myelogram-compatible radiological contrast medium.   lidocaine (XYLOCAINE) 2 % (with pres) injection 400 mg   methylPREDNISolone acetate (DEPO-MEDROL) injection 40 mg   dexamethasone (DECADRON) injection 10 mg   ropivacaine  (PF) 2 mg/mL (0.2%) (NAROPIN) injection 9 mL   diclofenac (FLECTOR) 1.3 % PTCH    Sig: Place 1 patch onto the skin 2 (two) times daily for 5 days.    Dispense:  10 patch    Refill:  0   Medications administered: We administered iohexol, lidocaine, methylPREDNISolone acetate, dexamethasone, and ropivacaine (PF) 2 mg/mL (0.2%).  See the medical record for exact dosing, route, and time of administration.  Follow-up plan:   Return in about 4 weeks (around 12/24/2022) for Post Procedure Evaluation, in person.       Status post left L3, L4, L5, S1 RFA on 06/15/2019, R L3,4,5, S1 RFA on 06/29/2019, left posterior glenohumeral shoulder steroid injection 01/02/2020: Not effective.  Left L4 and Sprint peripheral nerve stimulation on 04/25/2020, removed 06/11/2020.  Left suprascapular nerve block 07/01/2021               Recent Visits Date Type Provider Dept  11/25/22 Office Visit Gillis Santa, MD [facility]  Showing recent visits within past 90 days and meeting all other requirements Today's Visits Date Type Provider Dept  11/26/22 Procedure visit Gillis Santa, MD [facility]  Showing today's visits and meeting all other requirements Future Appointments Date Type Provider Dept  12/29/22 Appointment Gillis Santa, MD [facility]  Showing future appointments within next 90 days and meeting all other requirements  Disposition: Discharge home  Discharge (Date  Time): 11/26/2022; 1450 hrs.   Primary Care Physician: Tracie Harrier, MD Location: [facility] Note by: Gillis Santa, MD Date: 11/26/2022; Time: 3:18 PM  Disclaimer:  Medicine is not an exact science. The only guarantee in medicine is that nothing is guaranteed. It is important to note that the decision to proceed with this intervention was based on the information collected from the patient. The Data and conclusions were drawn from the patient's questionnaire, the interview, and  the physical examination.  Because the information was provided in large part by the patient, it cannot be guaranteed that it has not been purposely or unconsciously manipulated. Every effort has been made to obtain as much relevant data as possible for this evaluation. It is important to note that the conclusions that lead to this procedure are derived in large part from the available data. Always take into account that the treatment will also be dependent on availability of resources and existing treatment guidelines, considered by other Pain Management Practitioners as being common knowledge and practice, at the time of the intervention. For Medico-Legal purposes, it is also important to point out that variation in procedural techniques and pharmacological choices are the acceptable norm. The indications, contraindications, technique, and results of the above procedure should only be interpreted and judged by a Board-Certified Interventional Pain Specialist with extensive familiarity and expertise in the same exact procedure and technique.

## 2022-11-26 NOTE — Progress Notes (Signed)
Safety precautions to be maintained throughout the outpatient stay will include: orient to surroundings, keep bed in low position, maintain call bell within reach at all times, provide assistance with transfer out of bed and ambulation.  

## 2022-11-26 NOTE — Patient Instructions (Signed)
Pain Management Discharge Instructions  General Discharge Instructions :  If you need to reach your doctor call: Monday-Friday 8:00 am - 4:00 pm at [phone] or toll free [phone].  After clinic hours [phone] to have operator reach doctor.  Bring all of your medication bottles to all your appointments in the pain clinic.  To cancel or reschedule your appointment with Pain Management please remember to call 24 hours in advance to avoid a fee.  Refer to the educational materials which you have been given on: General Risks, I had my Procedure. Discharge Instructions, Post Sedation.  Post Procedure Instructions:  The drugs you were given will stay in your system until tomorrow, so for the next 24 hours you should not drive, make any legal decisions or drink any alcoholic beverages.  You may eat anything you prefer, but it is better to start with liquids then soups and crackers, and gradually work up to solid foods.  Please notify your doctor immediately if you have any unusual bleeding, trouble breathing or pain that is not related to your normal pain.  Depending on the type of procedure that was done, some parts of your body may feel week and/or numb.  This usually clears up by tonight or the next day.  Walk with the use of an assistive device or accompanied by an adult for the 24 hours.  You may use ice on the affected area for the first 24 hours.  Put ice in a Ziploc bag and cover with a towel and place against area 15 minutes on 15 minutes off.  You may switch to heat after 24 hours.Sacroiliac (SI) Joint Injection Patient Information  Description: The sacroiliac joint connects the scrum (very low back and tailbone) to the ilium (a pelvic bone which also forms half of the hip joint).  Normally this joint experiences very little motion.  When this joint becomes inflamed or unstable low back and or hip and pelvis pain may result.  Injection of this joint with local anesthetics  (numbing medicines) and steroids can provide diagnostic information and reduce pain.  This injection is performed with the aid of x-ray guidance into the tailbone area while you are lying on your stomach.   You may experience an electrical sensation down the leg while this is being done.  You may also experience numbness.  We also may ask if we are reproducing your normal pain during the injection.  Conditions which may be treated SI injection:  Low back, buttock, hip or leg pain  Preparation for the Injection:  Do not eat any solid food or dairy products within 8 hours of your appointment.  You may drink clear liquids up to 3 hours before appointment.  Clear liquids include water, black coffee, juice or soda.  No milk or cream please. You may take your regular medications, including pain medications with a sip of water before your appointment.  Diabetics should hold regular insulin (if take separately) and take 1/2 normal NPH dose the morning of the procedure.  Carry some sugar containing items with you to your appointment. A driver must accompany you and be prepared to drive you home after your procedure. Bring all of your current medications with you. An IV may be inserted and sedation may be given at the discretion of the physician. A blood pressure cuff, EKG and other monitors will often be applied during the procedure.  Some patients may need to have extra oxygen administered for a short period.  You will  be asked to provide medical information, including your allergies, prior to the procedure.  We must know immediately if you are taking blood thinners (like Coumadin/Warfarin) or if you are allergic to IV iodine contrast (dye).  We must know if you could possible be pregnant.  Possible side effects:  Bleeding from needle site Infection (rare, may require surgery) Nerve injury (rare) Numbness & tingling (temporary) A brief convulsion or seizure Light-headedness (temporary) Pain at  injection site (several days) Decreased blood pressure (temporary) Weakness in the leg (temporary)   Call if you experience:  New onset weakness or numbness of an extremity below the injection site that last more than 8 hours. Hives or difficulty breathing ( go to the emergency room) Inflammation or drainage at the injection site Any new symptoms which are concerning to you  Please note:  Although the local anesthetic injected can often make your back/ hip/ buttock/ leg feel good for several hours after the injections, the pain will likely return.  It takes 3-7 days for steroids to work in the sacroiliac area.  You may not notice any pain relief for at least that one week.  If effective, we will often do a series of three injections spaced 3-6 weeks apart to maximally decrease your pain.  After the initial series, we generally will wait some months before a repeat injection of the same type.  If you have any questions, please call [phone] [facility]  Facet Blocks Patient Information  Description: The facets are joints in the spine between the vertebrae.  Like any joints in the body, facets can become irritated and painful.  Arthritis can also effect the facets.  By injecting steroids and local anesthetic in and around these joints, we can temporarily block the nerve supply to them.  Steroids act directly on irritated nerves and tissues to reduce selling and inflammation which often leads to decreased pain.  Facet blocks may be done anywhere along the spine from the neck to the low back depending upon the location of your pain.   After numbing the skin with local anesthetic (like Novocaine), a small needle is passed onto the facet joints under x-ray guidance.  You may experience a sensation of pressure while this is being done.  The entire block usually lasts about 15-25 minutes.   Conditions which may be treated by facet blocks:  Low back/buttock  pain Neck/shoulder pain Certain types of headaches  Preparation for the injection:  Do not eat any solid food or dairy products within 8 hours of your appointment. You may drink clear liquid up to 3 hours before appointment.  Clear liquids include water, black coffee, juice or soda.  No milk or cream please. You may take your regular medication, including pain medications, with a sip of water before your appointment.  Diabetics should hold regular insulin (if taken separately) and take 1/2 normal NPH dose the morning of the procedure.  Carry some sugar containing items with you to your appointment. A driver must accompany you and be prepared to drive you home after your procedure. Bring all your current medications with you. An IV may be inserted and sedation may be given at the discretion of the physician. A blood pressure cuff, EKG and other monitors will often be applied during the procedure.  Some patients may need to have extra oxygen administered for a short period. You will be asked to provide medical information, including your allergies and medications, prior to the  procedure.  We must know immediately if you are taking blood thinners (like Coumadin/Warfarin) or if you are allergic to IV iodine contrast (dye).  We must know if you could possible be pregnant.  Possible side-effects:  Bleeding from needle site Infection (rare, may require surgery) Nerve injury (rare) Numbness & tingling (temporary) Difficulty urinating (rare, temporary) Spinal headache (a headache worse with upright posture) Light-headedness (temporary) Pain at injection site (serveral days) Decreased blood pressure (rare, temporary) Weakness in arm/leg (temporary) Pressure sensation in back/neck (temporary)   Call if you experience:  Fever/chills associated with headache or increased back/neck pain Headache worsened by an upright position New onset, weakness or numbness of an extremity below the injection  site Hives or difficulty breathing (go to the emergency room) Inflammation or drainage at the injection site(s) Severe back/neck pain greater than usual New symptoms which are concerning to you  Please note:  Although the local anesthetic injected can often make your back or neck feel good for several hours after the injection, the pain will likely return. It takes 3-7 days for steroids to work.  You may not notice any pain relief for at least one week.  If effective, we will often do a series of 2-3 injections spaced 3-6 weeks apart to maximally decrease your pain.  After the initial series, you may be a candidate for a more permanent nerve block of the facets.  If you have any questions, please call #[phone]) [facility]

## 2022-11-27 ENCOUNTER — Other Ambulatory Visit: Payer: Self-pay | Admitting: Student in an Organized Health Care Education/Training Program

## 2022-11-27 ENCOUNTER — Telehealth: Payer: Self-pay

## 2022-11-27 NOTE — Telephone Encounter (Signed)
After consulting with Dr. Holley Raring. Patient called and instructed that she may take the antiinflammatory as soon as she would like and that it is OK for her to use the tablet and patch simultaneously per Dr. Holley Raring.

## 2022-11-27 NOTE — Telephone Encounter (Signed)
Post procedure follow up.  LM 

## 2022-11-27 NOTE — Telephone Encounter (Signed)
She wants to know when and if Dr. Holley Raring wants her to start taking anti inflammatories.

## 2022-12-03 ENCOUNTER — Telehealth: Payer: Self-pay | Admitting: Student in an Organized Health Care Education/Training Program

## 2022-12-03 DIAGNOSIS — G894 Chronic pain syndrome: Secondary | ICD-10-CM

## 2022-12-03 DIAGNOSIS — M47816 Spondylosis without myelopathy or radiculopathy, lumbar region: Secondary | ICD-10-CM

## 2022-12-03 MED ORDER — PREDNISONE 20 MG PO TABS: ORAL_TABLET | ORAL | 0 refills | Status: DC

## 2022-12-03 NOTE — Telephone Encounter (Signed)
Patient has called again stating she can't stand the pain and wants to know if she can come in and see Dr Teresa [name] today for some kind of shot.

## 2022-12-03 NOTE — Telephone Encounter (Signed)
Sending in oral prednisone taper.  Requested Prescriptions   Signed Prescriptions Disp Refills   predniSONE (DELTASONE) 20 MG tablet 18 tablet 0    Sig: Take 3 tablets (60 mg total) by mouth daily with breakfast for 3 days, THEN 2 tablets (40 mg total) daily with breakfast for 3 days, THEN 1 tablet (20 mg total) daily with breakfast for 3 days.    Authorizing Provider: Gillis Santa

## 2022-12-03 NOTE — Telephone Encounter (Signed)
PT stated that she has been in horrible pain since she had procedure done last week. PT stated that she was trying to give it at least an week to see if pain will decrease. PT stated that the pain is worst than before she came in to get procedure done. Please give patient a call. Thanks

## 2022-12-03 NOTE — Telephone Encounter (Signed)
Patient notified

## 2022-12-05 ENCOUNTER — Telehealth: Payer: Self-pay | Admitting: Student in an Organized Health Care Education/Training Program

## 2022-12-05 NOTE — Telephone Encounter (Signed)
PT stated that she is still in a lot of pain, that the medication isn't helping. PT stated that she wanted to see if she can be prescribed with something strong to help ease her pain. Please give patient a call. Thanks

## 2022-12-08 NOTE — Telephone Encounter (Signed)
Attempted to call patient, message left. 

## 2022-12-08 NOTE — Telephone Encounter (Signed)
Continues to have back pain , no better after Medrol Dosepack.  Made appt for tomorrow, asking if you will see her today, to possibly schedule appt for Wed. Had procedure on 11-26-22.

## 2022-12-09 ENCOUNTER — Encounter: Payer: Self-pay | Admitting: Student in an Organized Health Care Education/Training Program

## 2022-12-09 ENCOUNTER — Ambulatory Visit
Payer: No Typology Code available for payment source | Attending: Student in an Organized Health Care Education/Training Program | Admitting: Student in an Organized Health Care Education/Training Program

## 2022-12-09 VITALS — BP 132/62 | HR 93 | Temp 98.0°F | Resp 16 | Ht 66.0 in | Wt 130.0 lb

## 2022-12-09 DIAGNOSIS — M47816 Spondylosis without myelopathy or radiculopathy, lumbar region: Secondary | ICD-10-CM | POA: Diagnosis not present

## 2022-12-09 DIAGNOSIS — M7918 Myalgia, other site: Secondary | ICD-10-CM | POA: Insufficient documentation

## 2022-12-09 DIAGNOSIS — G894 Chronic pain syndrome: Secondary | ICD-10-CM | POA: Insufficient documentation

## 2022-12-09 DIAGNOSIS — M533 Sacrococcygeal disorders, not elsewhere classified: Secondary | ICD-10-CM | POA: Diagnosis not present

## 2022-12-09 NOTE — Patient Instructions (Signed)
____________________________________________________________________________________________  General Risks and Possible Complications  Patient Responsibilities: It is important that you read this as it is part of your informed consent. It is our duty to inform you of the risks and possible complications associated with treatments offered to you. It is your responsibility as a patient to read this and to ask questions about anything that is not clear or that you believe was not covered in this document.  Patient's Rights: You have the right to refuse treatment. You also have the right to change your mind, even after initially having agreed to have the treatment done. However, under this last option, if you wait until the last second to change your mind, you may be charged for the materials used up to that point.  Introduction: Medicine is not an Chief Strategy Officer. Everything in Medicine, including the lack of treatment(s), carries the potential for danger, harm, or loss (which is by definition: Risk). In Medicine, a complication is a secondary problem, condition, or disease that can aggravate an already existing one. All treatments carry the risk of possible complications. The fact that a side effects or complications occurs, does not imply that the treatment was conducted incorrectly. It must be clearly understood that these can happen even when everything is done following the highest safety standards.  No treatment: You can choose not to proceed with the proposed treatment alternative. The "PRO(s)" would include: avoiding the risk of complications associated with the therapy. The "CON(s)" would include: not getting any of the treatment benefits. These benefits fall under one of three categories: diagnostic; therapeutic; and/or palliative. Diagnostic benefits include: getting information which can ultimately lead to improvement of the disease or symptom(s). Therapeutic benefits are those associated with  the successful treatment of the disease. Finally, palliative benefits are those related to the decrease of the primary symptoms, without necessarily curing the condition (example: decreasing the pain from a flare-up of a chronic condition, such as incurable terminal cancer).  General Risks and Complications: These are associated to most interventional treatments. They can occur alone, or in combination. They fall under one of the following six (6) categories: no benefit or worsening of symptoms; bleeding; infection; nerve damage; allergic reactions; and/or death. No benefits or worsening of symptoms: In Medicine there are no guarantees, only probabilities. No healthcare provider can ever guarantee that a medical treatment will work, they can only state the probability that it may. Furthermore, there is always the possibility that the condition may worsen, either directly, or indirectly, as a consequence of the treatment. Bleeding: This is more common if the patient is taking a blood thinner, either prescription or over the counter (example: Goody Powders, Fish oil, Aspirin, Garlic, etc.), or if suffering a condition associated with impaired coagulation (example: Hemophilia, cirrhosis of the liver, low platelet counts, etc.). However, even if you do not have one on these, it can still happen. If you have any of these conditions, or take one of these drugs, make sure to notify your treating physician. Infection: This is more common in patients with a compromised immune system, either due to disease (example: diabetes, cancer, human immunodeficiency virus [HIV], etc.), or due to medications or treatments (example: therapies used to treat cancer and rheumatological diseases). However, even if you do not have one on these, it can still happen. If you have any of these conditions, or take one of these drugs, make sure to notify your treating physician. Nerve Damage: This is more common when the treatment is an  invasive one, but it can also happen with the use of medications, such as those used in the treatment of cancer. The damage can occur to small secondary nerves, or to large primary ones, such as those in the spinal cord and brain. This damage may be temporary or permanent and it may lead to impairments that can range from temporary numbness to permanent paralysis and/or brain death. Allergic Reactions: Any time a substance or material comes in contact with our body, there is the possibility of an allergic reaction. These can range from a mild skin rash (contact dermatitis) to a severe systemic reaction (anaphylactic reaction), which can result in death. Death: In general, any medical intervention can result in death, most of the time due to an unforeseen complication. ____________________________________________________________________________________________    ______________________________________________________________________  Preparing for your procedure  During your procedure appointment there will be: No Prescription Refills. No disability issues to discussed. No medication changes or discussions.  Instructions: Food intake: Avoid eating anything solid for at least 8 hours prior to your procedure. Clear liquid intake: You may take clear liquids such as water up to 2 hours prior to your procedure. (No carbonated drinks. No soda.) Transportation: Unless otherwise stated by your physician, bring a driver. Morning Medicines: Except for blood thinners, take all of your other morning medications with a sip of water. Make sure to take your heart and blood pressure medicines. If your blood pressure's lower number is above 100, the case will be rescheduled. Blood thinners: Make sure to stop your blood thinners as instructed.  If you take a blood thinner, but were not instructed to stop it, call our office [phone] and ask to talk to a nurse. Not stopping a blood thinner prior to certain  procedures could lead to serious complications. Diabetics on insulin: Notify the staff so that you can be scheduled 1st case in the morning. If your diabetes requires high dose insulin, take only  of your normal insulin dose the morning of the procedure and notify the staff that you have done so. Preventing infections: Shower with an antibacterial soap the morning of your procedure.  Build-up your immune system: Take 1000 mg of Vitamin C with every meal (3 times a day) the day prior to your procedure. Antibiotics: Inform the nursing staff if you are taking any antibiotics or if you have any conditions that may require antibiotics prior to procedures. (Example: recent joint implants)   Pregnancy: If you are pregnant make sure to notify the nursing staff. Not doing so may result in injury to the fetus, including death.  Sickness: If you have a cold, fever, or any active infections, call and cancel or reschedule your procedure. Receiving steroids while having an infection may result in complications. Arrival: You must be in the facility at least 30 minutes prior to your scheduled procedure. Tardiness: Your scheduled time is also the cutoff time. If you do not arrive at least 15 minutes prior to your procedure, you will be rescheduled.  Children: Do not bring any children with you. Make arrangements to keep them home. Dress appropriately: There is always a possibility that your clothing may get soiled. Avoid long dresses. Valuables: Do not bring any jewelry or valuables.  Reasons to call and reschedule or cancel your procedure: (Following these recommendations will minimize the risk of a serious complication.) Surgeries: Avoid having procedures within 2 weeks of any surgery. (Avoid for 2 weeks before or after any surgery). Flu Shots: Avoid having procedures within 2  weeks of a flu shots or . (Avoid for 2 weeks before or after immunizations). Barium: Avoid having a procedure within 7-10 days after having  had a radiological study involving the use of radiological contrast. (Myelograms, Barium swallow or enema study). Heart attacks: Avoid any elective procedures or surgeries for the initial 6 months after a "Myocardial Infarction" (Heart Attack). Blood thinners: It is imperative that you stop these medications before procedures. Let us know if you if you take any blood thinner.  Infection: Avoid procedures during or within two weeks of an infection (including chest colds or gastrointestinal problems). Symptoms associated with infections include: Localized redness, fever, chills, night sweats or profuse sweating, burning sensation when voiding, cough, congestion, stuffiness, runny nose, sore throat, diarrhea, nausea, vomiting, cold or Flu symptoms, recent or current infections. It is specially important if the infection is over the area that we intend to treat. Heart and lung problems: Symptoms that may suggest an active cardiopulmonary problem include: cough, chest pain, breathing difficulties or shortness of breath, dizziness, ankle swelling, uncontrolled high or unusually low blood pressure, and/or palpitations. If you are experiencing any of these symptoms, cancel your procedure and contact your primary care physician for an evaluation.  Remember:  Regular Business hours are:  Monday to Thursday 8:00 AM to 4:00 PM  Provider's Schedule: Milinda Pointer, MD:  Procedure days: Tuesday and Thursday 7:30 AM to 4:00 PM  Gillis Santa, MD:  Procedure days: Monday and Wednesday 7:30 AM to 4:00 PM  ______________________________________________________________________   Facet Blocks Patient Information  Description: The facets are joints in the spine between the vertebrae.  Like any joints in the body, facets can become irritated and painful.  Arthritis can also effect the facets.  By injecting steroids and local anesthetic in and around these joints, we can temporarily block the nerve supply to them.   Steroids act directly on irritated nerves and tissues to reduce selling and inflammation which often leads to decreased pain.  Facet blocks may be done anywhere along the spine from the neck to the low back depending upon the location of your pain.   After numbing the skin with local anesthetic (like Novocaine), a small needle is passed onto the facet joints under x-ray guidance.  You may experience a sensation of pressure while this is being done.  The entire block usually lasts about 15-25 minutes.   Conditions which may be treated by facet blocks:  Low back/buttock pain Neck/shoulder pain Certain types of headaches  Preparation for the injection:  Do not eat any solid food or dairy products within 8 hours of your appointment. You may drink clear liquid up to 3 hours before appointment.  Clear liquids include water, black coffee, juice or soda.  No milk or cream please. You may take your regular medication, including pain medications, with a sip of water before your appointment.  Diabetics should hold regular insulin (if taken separately) and take 1/2 normal NPH dose the morning of the procedure.  Carry some sugar containing items with you to your appointment. A driver must accompany you and be prepared to drive you home after your procedure. Bring all your current medications with you. An IV may be inserted and sedation may be given at the discretion of the physician. A blood pressure cuff, EKG and other monitors will often be applied during the procedure.  Some patients may need to have extra oxygen administered for a short period. You will be asked to provide medical information, including your  allergies and medications, prior to the procedure.  We must know immediately if you are taking blood thinners (like Coumadin/Warfarin) or if you are allergic to IV iodine contrast (dye).  We must know if you could possible be pregnant.  Possible side-effects:  Bleeding from needle site Infection  (rare, may require surgery) Nerve injury (rare) Numbness & tingling (temporary) Difficulty urinating (rare, temporary) Spinal headache (a headache worse with upright posture) Light-headedness (temporary) Pain at injection site (serveral days) Decreased blood pressure (rare, temporary) Weakness in arm/leg (temporary) Pressure sensation in back/neck (temporary)   Call if you experience:  Fever/chills associated with headache or increased back/neck pain Headache worsened by an upright position New onset, weakness or numbness of an extremity below the injection site Hives or difficulty breathing (go to the emergency room) Inflammation or drainage at the injection site(s) Severe back/neck pain greater than usual New symptoms which are concerning to you  Please note:  Although the local anesthetic injected can often make your back or neck feel good for several hours after the injection, the pain will likely return. It takes 3-7 days for steroids to work.  You may not notice any pain relief for at least one week.  If effective, we will often do a series of 2-3 injections spaced 3-6 weeks apart to maximally decrease your pain.  After the initial series, you may be a candidate for a more permanent nerve block of the facets.  If you have any questions, please call #[phone]) [facility]

## 2022-12-09 NOTE — Progress Notes (Signed)
PROVIDER NOTE: Information contained herein reflects review and annotations entered in association with encounter. Interpretation of such information and data should be left to medically-trained personnel. Information provided to patient can be located elsewhere in the medical record under "Patient Instructions". Document created using STT-dictation technology, any transcriptional errors that may result from process are unintentional.    Patient: Teresa [name]  Service Category: E/M  Provider: Gillis Santa, MD  DOB: 09-29-1956  DOS: 12/09/2022  Referring Provider: Tracie Harrier, MD  MRN: [id number]  Specialty: Interventional Pain Management  PCP: Tracie Harrier, MD  Type: Established Patient  Setting: Ambulatory outpatient    Location: Office  Delivery: Face-to-face     HPI  [name], a 67 y.o. year old female, is here today because of her Lumbar facet arthropathy [M47.816]. [name] primary complain today is Back Pain (Lumbar right is worse ) Last encounter: My last encounter with her was on 12/05/2022. Pertinent problems: [name] has INSOMNIA, CHRONIC; DEPRESSION; BACK PAIN, CHRONIC; Lumbar radiculopathy (right L5); Lumbar spondylosis; Degeneration of lumbar or lumbosacral intervertebral disc; Chronic pain syndrome; Arthritis of left glenohumeral joint; and Left rotator cuff tear arthropathy on their pertinent problem list. Pain Assessment: Severity of Chronic pain is reported as a 9 /10. Location: Back Lower, Left, Right/down the right leg to the foot. Onset: More than a month ago. Quality: Discomfort, Constant, Numbness, Aching, Throbbing. Timing: Constant. Modifying factor(s): nothing currently. Vitals:  height is '5\' 6"'$  (1.676 m) and weight is 130 lb (59 kg). Her temporal temperature is 98 F (36.7 C). Her blood pressure is 132/62 and her pulse is 93. Her respiration is 16 and oxygen saturation is 100%.  BMI: Estimated body mass index is 20.98 kg/m as calculated from the  following:   Height as of this encounter: '5\' 6"'$  (1.676 m).   Weight as of this encounter: 130 lb (59 kg).  Reason for encounter: post-procedure evaluation and assessment.  Patient continues to have persistent axial low back pain overlying her L3, L4, L5 facets.  She states that she did receive benefit with her facet blocks, rated as 80% for 5 days but states that the pain is now severe again.  She states that she does have less pain and is able to perform movements more comfortably although her pain is pretty severe.  She is requesting repeating bilateral therapeutic lumbar facet medial branch nerve blocks.  Risks and benefits were reviewed and patient would like to proceed.   ROS  Constitutional: Denies any fever or chills Gastrointestinal: No reported hemesis, hematochezia, vomiting, or acute GI distress Musculoskeletal:  Axial low back pain Neurological: No reported episodes of acute onset apraxia, aphasia, dysarthria, agnosia, amnesia, paralysis, loss of coordination, or loss of consciousness  Medication Review  Calcium Carb-Cholecalciferol, Inulin, Vitamin D3, acetaminophen, alendronate, aspirin-acetaminophen-caffeine, cyanocobalamin, diclofenac, dicyclomine, ferrous gluconate, gabapentin, nortriptyline, oxyCODONE-acetaminophen, pantoprazole, tiZANidine, topiramate, vitamin C, and zolpidem  History Review  Allergy: [name] is allergic to elastic bandages & [zinc] and morphine. Drug: [name]  reports no history of drug use. Alcohol:  reports no history of alcohol use. Tobacco:  reports that she has never smoked. She has never used smokeless tobacco. Social: Teresa [name]  reports that she has never smoked. She has never used smokeless tobacco. She reports that she does not drink alcohol and does not use drugs. Medical:  has a past medical history of Dyspnea, Headache, History of fractured vertebra (1982), Hyperlipidemia, Low bone density, Melanoma ([place]), and Transfusion of blood  product refused for religious reason. Surgical: Teresa [name]  has a past surgical history that includes Abdominal hysterectomy; Colonoscopy (11/03/1998); Eye surgery (Bilateral, 11/03/2012); Artery Biopsy (Left, 12/18/2015); melanoma excisison (Right); and Cholecystectomy. Family: family history includes Dementia in her mother; Heart attack in her father.  Laboratory Chemistry Profile   Renal Lab Results  Component Value Date   BUN 10 07/14/2022   CREATININE 0.70 07/14/2022   GFRAA >90 08/14/2014   GFRNONAA >60 07/14/2022    Hepatic Lab Results  Component Value Date   AST 14 (L) 07/13/2022   ALT 16 07/13/2022   ALBUMIN 3.6 07/13/2022   ALKPHOS 44 07/13/2022   LIPASE 141 (H) 07/13/2022    Electrolytes Lab Results  Component Value Date   NA 144 07/14/2022   K 3.5 07/14/2022   CL 120 (H) 07/14/2022   CALCIUM 8.0 (L) 07/14/2022   MG 2.1 07/13/2022    Bone No results found for: "VD25OH", "VD125OH2TOT", "AN1916OM6", "YO4599HF4", "25OHVITD1", "25OHVITD2", "25OHVITD3", "TESTOFREE", "TESTOSTERONE"  Inflammation (CRP: Acute Phase) (ESR: Chronic Phase) Lab Results  Component Value Date   LATICACIDVEN 1.1 07/13/2022         Note: Above Lab results reviewed.   Physical Exam  General appearance: alert, cooperative, and in moderate distress Mental status: Alert, oriented x 3 (person, place, & time)       Respiratory: No evidence of acute respiratory distress Eyes: PERLA Vitals: BP 132/62 (BP Location: Left Arm, Patient Position: Sitting, Cuff Size: Normal)   Pulse 93   Temp 98 F (36.7 C) (Temporal)   Resp 16   Ht '5\' 6"'$  (1.676 m)   Wt 130 lb (59 kg)   SpO2 100%   BMI 20.98 kg/m  BMI: Estimated body mass index is 20.98 kg/m as calculated from the following:   Height as of this encounter: '5\' 6"'$  (1.676 m).   Weight as of this encounter: 130 lb (59 kg). Ideal: Ideal body weight: 59.3 kg (130 lb 11.7 oz)  Lumbar Spine Area Exam  Skin & Axial Inspection: No masses, redness,  or swelling Alignment: Symmetrical Functional ROM: Pain restricted ROM affecting both sides Stability: No instability detected Muscle Tone/Strength: Functionally intact. No obvious neuro-muscular anomalies detected. Sensory (Neurological): Musculoskeletal pain pattern facet mediated Palpation: Complains of area being tender to palpation       Provocative Tests: Hyperextension/rotation test: (+) bilaterally for facet joint pain. Lumbar quadrant test (Kemp's test): (+) bilaterally for facet joint pain.  Gait & Posture Assessment  Ambulation: Limited Gait: Antalgic gait (limping) Posture: Difficulty standing up straight, due to pain  Lower Extremity Exam    Side: Right lower extremity  Side: Left lower extremity  Stability: No instability observed          Stability: No instability observed          Skin & Extremity Inspection: Skin color, temperature, and hair growth are WNL. No peripheral edema or cyanosis. No masses, redness, swelling, asymmetry, or associated skin lesions. No contractures.  Skin & Extremity Inspection: Skin color, temperature, and hair growth are WNL. No peripheral edema or cyanosis. No masses, redness, swelling, asymmetry, or associated skin lesions. No contractures.  Functional ROM: Unrestricted ROM                  Functional ROM: Unrestricted ROM                  Muscle Tone/Strength: Functionally intact. No obvious neuro-muscular anomalies detected.  Muscle Tone/Strength: Functionally intact. No obvious neuro-muscular anomalies  detected.  Sensory (Neurological): Unimpaired        Sensory (Neurological): Unimpaired        DTR: Patellar: deferred today Achilles: deferred today Plantar: deferred today  DTR: Patellar: deferred today Achilles: deferred today Plantar: deferred today  Palpation: No palpable anomalies  Palpation: No palpable anomalies    Assessment   Diagnosis Status  1. Lumbar facet arthropathy (L3/4 and L4/5)   2. Lumbar spondylosis   3.  Myofascial pain syndrome   4. Sacroiliac joint pain   5. Chronic pain syndrome    Having a Flare-up Persistent Persistent   Updated Problems: Problem  Lumbar Spondylosis    Plan of Care   Orders:  Orders Placed This Encounter  Procedures   LUMBAR FACET(MEDIAL BRANCH NERVE BLOCK) MBNB    Standing Status:   Future    Standing Expiration Date:   03/09/2023    Scheduling Instructions:     Procedure: Lumbar facet block (AKA.: Lumbosacral medial branch nerve block)     Side: Bilateral     Level: L3-4, L4-5  Facets ( L3, L4, L5,  Medial Branch)     Sedation: without     Timeframe: ASAA    Order Specific Question:   Where will this procedure be performed?    Answer:   [facility]   Follow-up plan:   Return in about 1 day (around 12/10/2022) for B/L L3,4,5 MBNB #2.     Status post left L3, L4, L5, S1 RFA on 06/15/2019, R L3,4,5, S1 RFA on 06/29/2019, left posterior glenohumeral shoulder steroid injection 01/02/2020: Not effective.  Left L4 and Sprint peripheral nerve stimulation on 04/25/2020, removed 06/11/2020.  Left suprascapular nerve block 07/01/2021                Recent Visits Date Type Provider Dept  11/26/22 Procedure visit Gillis Santa, MD [facility]  11/25/22 Office Visit Gillis Santa, MD [facility]  Showing recent visits within past 90 days and meeting all other requirements Today's Visits Date Type Provider Dept  12/09/22 Office Visit Gillis Santa, MD [facility]  Showing today's visits and meeting all other requirements Future Appointments No visits were found meeting these conditions. Showing future appointments within next 90 days and meeting all other requirements  I discussed the assessment and treatment plan with the patient. The patient was provided an opportunity to ask questions and all were answered. The patient agreed with the plan and demonstrated an understanding of the instructions.  Patient advised to call back or  seek an in-person evaluation if the symptoms or condition worsens.  Duration of encounter: 46mnutes.  Total time on encounter, as per AMA guidelines included both the face-to-face and non-face-to-face time personally spent by the physician and/or other qualified health care professional(s) on the day of the encounter (includes time in activities that require the physician or other qualified health care professional and does not include time in activities normally performed by clinical staff). Physician's time may include the following activities when performed: Preparing to see the patient (e.g., pre-charting review of records, searching for previously ordered imaging, lab work, and nerve conduction tests) Review of prior analgesic pharmacotherapies. Reviewing PMP Interpreting ordered tests (e.g., lab work, imaging, nerve conduction tests) Performing post-procedure evaluations, including interpretation of diagnostic procedures Obtaining and/or reviewing separately obtained history Performing a medically appropriate examination and/or evaluation Counseling and educating the patient/family/caregiver Ordering medications, tests, or procedures Referring and communicating with other health care professionals (when not separately reported)  Documenting clinical information in the electronic or other health record Independently interpreting results (not separately reported) and communicating results to the patient/ family/caregiver Care coordination (not separately reported)  Note by: Gillis Santa, MD Date: 12/09/2022; Time: 9:34 AM

## 2022-12-09 NOTE — Progress Notes (Signed)
Safety precautions to be maintained throughout the outpatient stay will include: orient to surroundings, keep bed in low position, maintain call bell within reach at all times, provide assistance with transfer out of bed and ambulation.  

## 2022-12-10 ENCOUNTER — Encounter: Payer: Self-pay | Admitting: Student in an Organized Health Care Education/Training Program

## 2022-12-10 ENCOUNTER — Ambulatory Visit
Admission: RE | Admit: 2022-12-10 | Discharge: 2022-12-10 | Disposition: A | Discharge status: Home / Self Care | Payer: No Typology Code available for payment source | Source: Ambulatory Visit | Attending: Student in an Organized Health Care Education/Training Program | Admitting: Student in an Organized Health Care Education/Training Program

## 2022-12-10 ENCOUNTER — Ambulatory Visit
Payer: No Typology Code available for payment source | Attending: Student in an Organized Health Care Education/Training Program | Admitting: Student in an Organized Health Care Education/Training Program

## 2022-12-10 VITALS — BP 130/69 | HR 103 | Temp 97.2°F | Resp 10 | Ht 66.0 in | Wt 130.0 lb

## 2022-12-10 DIAGNOSIS — M47816 Spondylosis without myelopathy or radiculopathy, lumbar region: Secondary | ICD-10-CM | POA: Diagnosis not present

## 2022-12-10 DIAGNOSIS — G894 Chronic pain syndrome: Secondary | ICD-10-CM | POA: Insufficient documentation

## 2022-12-10 MED ORDER — DEXAMETHASONE SODIUM PHOSPHATE 10 MG/ML IJ SOLN
10.0000 mg | Freq: Once | INTRAMUSCULAR | Status: AC
  Administered 2022-12-10: 10 mg

## 2022-12-10 MED ORDER — ROPIVACAINE HCL 2 MG/ML IJ SOLN
9.0000 mL | Freq: Once | INTRAMUSCULAR | Status: AC
  Administered 2022-12-10: 9 mL via PERINEURAL

## 2022-12-10 MED ORDER — LIDOCAINE HCL 2 % IJ SOLN
INTRAMUSCULAR | Status: AC
  Filled 2022-12-10: qty 20

## 2022-12-10 MED ORDER — LIDOCAINE HCL 2 % IJ SOLN
20.0000 mL | Freq: Once | INTRAMUSCULAR | Status: AC
  Administered 2022-12-10: 400 mg

## 2022-12-10 MED ORDER — ROPIVACAINE HCL 2 MG/ML IJ SOLN
INTRAMUSCULAR | Status: AC
  Filled 2022-12-10: qty 20

## 2022-12-10 MED ORDER — DEXAMETHASONE SODIUM PHOSPHATE 10 MG/ML IJ SOLN
INTRAMUSCULAR | Status: AC
  Filled 2022-12-10: qty 2

## 2022-12-10 NOTE — Progress Notes (Signed)
PROVIDER NOTE: Interpretation of information contained herein should be left to medically-trained personnel. Specific patient instructions are provided elsewhere under "Patient Instructions" section of medical record. This document was created in part using STT-dictation technology, any transcriptional errors that may result from this process are unintentional.  Patient: Teresa [name] Type: Established DOB: [birth year] MRN: [id number] PCP: Tracie Harrier, MD  Service: Procedure DOS: 12/10/2022 Setting: Ambulatory Location: Ambulatory outpatient facility Delivery: Face-to-face Provider: Gillis Santa, MD Specialty: Interventional Pain Management Specialty designation: 09 Location: Outpatient facility Ref. Prov.: Tracie Harrier, MD       Health-care intervention ([facility] )    Procedure: Lumbar Facet, Medial Branch Block(s) #2  Laterality: Bilateral  Level: L3, L4, and L5 Medial Branch Level(s). Injecting these levels blocks the L2-3, L3-4, and L4-5 lumbar facet joints. Imaging: Fluoroscopic guidance         Anesthesia: Local anesthesia (1-2% Lidocaine) DOS: 12/10/2022 Performed by: Gillis Santa, MD  Primary Purpose: Diagnostic/Therapeutic Indications: Low back pain severe enough to impact quality of life or function. 1. Lumbar facet arthropathy (L3/4 and L4/5)   2. Lumbar spondylosis   3. Chronic pain syndrome    NAS-11 Pain score:   Pre-procedure: 9 /10   Post-procedure: 5 /10     Position / Prep / Materials:  Position: Prone  Prep solution: DuraPrep (Iodine Povacrylex [0.7% available iodine] and Isopropyl Alcohol, 74% w/w) Area Prepped: Posterolateral Lumbosacral Spine (Wide prep: From the lower border of the scapula down to the end of the tailbone and from flank to flank.)  Materials:  Tray: Block Needle(s):  Type: Spinal  Gauge (G): 22, #2    Pre-op H&P Assessment:  Teresa [name] is a 67 y.o. (year old), female patient, seen today for  interventional treatment. She  has a past surgical history that includes Abdominal hysterectomy; Colonoscopy (11/03/1998); Eye surgery (Bilateral, 11/03/2012); Artery Biopsy (Left, 12/18/2015); melanoma excisison (Right); and Cholecystectomy. Teresa [name] has a current medication list which includes the following prescription(s): acetaminophen, alendronate, vitamin c, aspirin-acetaminophen-caffeine, calcium carb-cholecalciferol, vitamin d3, cvs vitamin b12, diclofenac, dicyclomine, ferrous gluconate, gabapentin, inulin, nortriptyline, oxycodone-acetaminophen, tizanidine, topiramate, zolpidem, and pantoprazole. Her primarily concern today is the Back Pain  Initial Vital Signs:  Pulse/HCG Rate: (!) 103ECG Heart Rate: 88 Temp: (!) 97.2 F (36.2 C) Resp: 18 BP: (!) 130/93 SpO2: 97 %  BMI: Estimated body mass index is 20.98 kg/m as calculated from the following:   Height as of this encounter: '5\' 6"'$  (1.676 m).   Weight as of this encounter: 130 lb (59 kg).  Risk Assessment: Allergies: Reviewed. She is allergic to elastic bandages & [zinc] and morphine.  Allergy Precautions: None required Coagulopathies: Reviewed. None identified.  Blood-thinner therapy: None at this time Active Infection(s): Reviewed. None identified. Teresa [name] is afebrile  Site Confirmation: Teresa [name] was asked to confirm the procedure and laterality before marking the site Procedure checklist: Completed Consent: Before the procedure and under the influence of no sedative(s), amnesic(s), or anxiolytics, the patient was informed of the treatment options, risks and possible complications. To fulfill our ethical and legal obligations, as recommended by the American Medical Association's Code of Ethics, I have informed the patient of my clinical impression; the nature and purpose of the treatment or procedure; the risks, benefits, and possible complications of the intervention; the alternatives, including doing nothing; the risk(s)  and benefit(s) of the alternative treatment(s) or procedure(s); and the risk(s) and benefit(s) of doing nothing. The patient was provided information about the general risks and  possible complications associated with the procedure. These may include, but are not limited to: failure to achieve desired goals, infection, bleeding, organ or nerve damage, allergic reactions, paralysis, and death. In addition, the patient was informed of those risks and complications associated to Spine-related procedures, such as failure to decrease pain; infection (i.e.: Meningitis, epidural or intraspinal abscess); bleeding (i.e.: epidural hematoma, subarachnoid hemorrhage, or any other type of intraspinal or peri-dural bleeding); organ or nerve damage (i.e.: Any type of peripheral nerve, nerve root, or spinal cord injury) with subsequent damage to sensory, motor, and/or autonomic systems, resulting in permanent pain, numbness, and/or weakness of one or several areas of the body; allergic reactions; (i.e.: anaphylactic reaction); and/or death. Furthermore, the patient was informed of those risks and complications associated with the medications. These include, but are not limited to: allergic reactions (i.e.: anaphylactic or anaphylactoid reaction(s)); adrenal axis suppression; blood sugar elevation that in diabetics may result in ketoacidosis or comma; water retention that in patients with history of congestive heart failure may result in shortness of breath, pulmonary edema, and decompensation with resultant heart failure; weight gain; swelling or edema; medication-induced neural toxicity; particulate matter embolism and blood vessel occlusion with resultant organ, and/or nervous system infarction; and/or aseptic necrosis of one or more joints. Finally, the patient was informed that Medicine is not an exact science; therefore, there is also the possibility of unforeseen or unpredictable risks and/or possible complications that  may result in a catastrophic outcome. The patient indicated having understood very clearly. We have given the patient no guarantees and we have made no promises. Enough time was given to the patient to ask questions, all of which were answered to the patient's satisfaction. Ms. Beyersdorf has indicated that she wanted to continue with the procedure. Attestation: I, the ordering provider, attest that I have discussed with the patient the benefits, risks, side-effects, alternatives, likelihood of achieving goals, and potential problems during recovery for the procedure that I have provided informed consent. Date  Time: 12/10/2022  7:55 AM   Pre-Procedure Preparation:  Monitoring: As per clinic protocol. Respiration, ETCO2, SpO2, BP, heart rate and rhythm monitor placed and checked for adequate function Safety Precautions: Patient was assessed for positional comfort and pressure points before starting the procedure. Time-out: I initiated and conducted the "Time-out" before starting the procedure, as per protocol. The patient was asked to participate by confirming the accuracy of the "Time Out" information. Verification of the correct person, site, and procedure were performed and confirmed by me, the nursing staff, and the patient. "Time-out" conducted as per Joint Commission's Universal Protocol (UP.01.01.01). Time: 0820  Description of Procedure:          Laterality: (see above) Targeted Levels: (see above)  Safety Precautions: Aspiration looking for blood return was conducted prior to all injections. At no point did we inject any substances, as a needle was being advanced. Before injecting, the patient was told to immediately notify me if she was experiencing any new onset of "ringing in the ears, or metallic taste in the mouth". No attempts were made at seeking any paresthesias. Safe injection practices and needle disposal techniques used. Medications properly checked for expiration dates. SDV (single  dose vial) medications used. After the completion of the procedure, all disposable equipment used was discarded in the proper designated medical waste containers. Local Anesthesia: Protocol guidelines were followed. The patient was positioned over the fluoroscopy table. The area was prepped in the usual manner. The time-out was completed. The target area was  identified using fluoroscopy. A 12-in long, straight, sterile hemostat was used with fluoroscopic guidance to locate the targets for each level blocked. Once located, the skin was marked with an approved surgical skin marker. Once all sites were marked, the skin (epidermis, dermis, and hypodermis), as well as deeper tissues (fat, connective tissue and muscle) were infiltrated with a small amount of a short-acting local anesthetic, loaded on a 10cc syringe with a 25G, 1.5-in  Needle. An appropriate amount of time was allowed for local anesthetics to take effect before proceeding to the next step. Local Anesthetic: Lidocaine 2.0% The unused portion of the local anesthetic was discarded in the proper designated containers. Technical description of process:  L3 Medial Branch Nerve Block (MBB): The target area for the L3 medial branch is at the junction of the postero-lateral aspect of the superior articular process and the superior, posterior, and medial edge of the transverse process of L4. Under fluoroscopic guidance, a Quincke needle was inserted until contact was made with os over the superior postero-lateral aspect of the pedicular shadow (target area). After negative aspiration for blood, 49m of the nerve block solution was injected without difficulty or complication. The needle was removed intact. L4 Medial Branch Nerve Block (MBB): The target area for the L4 medial branch is at the junction of the postero-lateral aspect of the superior articular process and the superior, posterior, and medial edge of the transverse process of L5. Under fluoroscopic  guidance, a Quincke needle was inserted until contact was made with os over the superior postero-lateral aspect of the pedicular shadow (target area). After negative aspiration for blood,2 mL of the nerve block solution was injected without difficulty or complication. The needle was removed intact. L5 Medial Branch Nerve Block (MBB): The target area for the L5 medial branch is at the junction of the postero-lateral aspect of the superior articular process and the superior, posterior, and medial edge of the sacral ala. Under fluoroscopic guidance, a Quincke needle was inserted until contact was made with os over the superior postero-lateral aspect of the pedicular shadow (target area). After negative aspiration for blood, 272mof the nerve block solution was injected without difficulty or complication. The needle was removed intact.   Once the entire procedure was completed, the treated area was cleaned, making sure to leave some of the prepping solution back to take advantage of its long term bactericidal properties.         Illustration of the posterior view of the lumbar spine and the posterior neural structures. Laminae of L2 through S1 are labeled. DPRL5, dorsal primary ramus of L5; DPRS1, dorsal primary ramus of S1; DPR3, dorsal primary ramus of L3; FJ, facet (zygapophyseal) joint L3-L4; I, inferior articular process of L4; LB1, lateral branch of dorsal primary ramus of L1; IAB, inferior articular branches from L3 medial branch (supplies L4-L5 facet joint); IBP, intermediate branch plexus; MB3, medial branch of dorsal primary ramus of L3; NR3, third lumbar nerve root; S, superior articular process of L5; SAB, superior articular branches from L4 (supplies L4-5 facet joint also); TP3, transverse process of L3.  Vitals:   12/10/22 0817 12/10/22 0822 12/10/22 0827 12/10/22 0829  BP: 128/60 129/60 135/66 130/69  Pulse:      Resp: '14 10 10 10  '$ Temp:      SpO2: 98% 98% 98% 99%  Weight:      Height:          Start Time: 0820 hrs. End Time: 0829 hrs.  Imaging Guidance (  Spinal):          Type of Imaging Technique: Fluoroscopy Guidance (Spinal) Indication(s): Assistance in needle guidance and placement for procedures requiring needle placement in or near specific anatomical locations not easily accessible without such assistance. Exposure Time: Please see nurses notes. Contrast: None used. Fluoroscopic Guidance: I was personally present during the use of fluoroscopy. "Tunnel Vision Technique" used to obtain the best possible view of the target area. Parallax error corrected before commencing the procedure. "Direction-depth-direction" technique used to introduce the needle under continuous pulsed fluoroscopy. Once target was reached, antero-posterior, oblique, and lateral fluoroscopic projection used confirm needle placement in all planes. Images permanently stored in EMR. Interpretation: No contrast injected. I personally interpreted the imaging intraoperatively. Adequate needle placement confirmed in multiple planes. Permanent images saved into the patient's record.  Post-operative Assessment:  Post-procedure Vital Signs:  Pulse/HCG Rate: (!) 10388 Temp: (!) 97.2 F (36.2 C) Resp: 10 BP: 130/69 SpO2: 99 %  EBL: None  Complications: No immediate post-treatment complications observed by team, or reported by patient.  Note: The patient tolerated the entire procedure well. A repeat set of vitals were taken after the procedure and the patient was kept under observation following institutional policy, for this type of procedure. Post-procedural neurological assessment was performed, showing return to baseline, prior to discharge. The patient was provided with post-procedure discharge instructions, including a section on how to identify potential problems. Should any problems arise concerning this procedure, the patient was given instructions to immediately contact us, at any time, without  hesitation. In any case, we plan to contact the patient by telephone for a follow-up status report regarding this interventional procedure.  Comments:  No additional relevant information.  Plan of Care (POC)  Orders:  Orders Placed This Encounter  Procedures   DG PAIN CLINIC C-ARM 1-60 MIN NO REPORT    Intraoperative interpretation by procedural physician at [place].    Standing Status:   Standing    Number of Occurrences:   1    Order Specific Question:   Reason for exam:    Answer:   Assistance in needle guidance and placement for procedures requiring needle placement in or near specific anatomical locations not easily accessible without such assistance.   MR LUMBAR SPINE WO CONTRAST    Patient presents with axial pain with possible radicular component. Please assist [place] in identifying specific level(s) and laterality of any additional findings such as: 1. Facet (Zygapophyseal) joint DJD (Hypertrophy, space narrowing, subchondral sclerosis, and/or osteophyte formation) 2. DDD and/or IVDD (Loss of disc height, desiccation, gas patterns, osteophytes, endplate sclerosis, or "Black disc disease") 3. Pars defects 4. Spondylolisthesis, spondylosis, and/or spondyloarthropathies (include Degree/Grade of displacement in mm) (stability) 5. Vertebral body Fractures (acute/chronic) (state percentage of collapse) 6. Demineralization (osteopenia/osteoporotic) 7. Bone pathology 8. Foraminal narrowing  9. Surgical changes 10. Central, Lateral Recess, and/or Foraminal Stenosis (include AP diameter of stenosis in mm) 11. Surgical changes (hardware type, status, and presence of fibrosis) 12. Modic Type Changes (MRI only) 13. IVDD (Disc bulge, protrusion, herniation, extrusion) (Level, laterality, extent)    Standing Status:   Future    Standing Expiration Date:   01/08/2023    Scheduling Instructions:     Please make sure that the patient understands that this needs to be done as soon as  possible. Never have the patient do the imaging "just before the next appointment". Inform patient that having the imaging done within the [facility] will expedite the availability of the  results and will provide      imaging availability to the requesting physician. In addition inform the patient that the imaging order has an expiration date and will not be renewed if not done within the active period.    Order Specific Question:   What is the patient's sedation requirement?    Answer:   No Sedation    Order Specific Question:   Does the patient have a pacemaker or implanted devices?    Answer:   No    Order Specific Question:   Preferred imaging location?    Answer:   [facility] (table limit-350lbs)    Order Specific Question:   Call Results- Best Contact Number?    Answer:   [phone] ([facility])    Order Specific Question:   Radiology Contrast Protocol - do NOT remove file path    Answer:   [email or web address]     Medications ordered for procedure: Meds ordered this encounter  Medications   lidocaine (XYLOCAINE) 2 % (with pres) injection 400 mg   dexamethasone (DECADRON) injection 10 mg   ropivacaine (PF) 2 mg/mL (0.2%) (NAROPIN) injection 9 mL   ropivacaine (PF) 2 mg/mL (0.2%) (NAROPIN) injection 9 mL   dexamethasone (DECADRON) injection 10 mg   Medications administered: We administered lidocaine, dexamethasone, ropivacaine (PF) 2 mg/mL (0.2%), ropivacaine (PF) 2 mg/mL (0.2%), and dexamethasone.  See the medical record for exact dosing, route, and time of administration.  Follow-up plan:   Return in about 4 weeks (around 01/07/2023) for Post Procedure Evaluation, virtual.     Recent Visits Date Type Provider Dept  12/09/22 Office Visit Gillis Santa, MD [facility]  11/26/22 Procedure visit Gillis Santa, MD [facility]  11/25/22 Office Visit Gillis Santa, MD [facility]  Showing recent visits  within past 90 days and meeting all other requirements Today's Visits Date Type Provider Dept  12/10/22 Procedure visit Gillis Santa, MD [facility]  Showing today's visits and meeting all other requirements Future Appointments Date Type Provider Dept  01/07/23 Appointment Gillis Santa, MD [facility]  Showing future appointments within next 90 days and meeting all other requirements  Disposition: Discharge home  Discharge (Date  Time): 12/10/2022; 0835 hrs.   Primary Care Physician: Tracie Harrier, MD Location: [facility] Note by: Gillis Santa, MD Date: 12/10/2022; Time: 9:51 AM  Disclaimer:  Medicine is not an exact science. The only guarantee in medicine is that nothing is guaranteed. It is important to note that the decision to proceed with this intervention was based on the information collected from the patient. The Data and conclusions were drawn from the patient's questionnaire, the interview, and the physical examination. Because the information was provided in large part by the patient, it cannot be guaranteed that it has not been purposely or unconsciously manipulated. Every effort has been made to obtain as much relevant data as possible for this evaluation. It is important to note that the conclusions that lead to this procedure are derived in large part from the available data. Always take into account that the treatment will also be dependent on availability of resources and existing treatment guidelines, considered by other Pain Management Practitioners as being common knowledge and practice, at the time of the intervention. For Medico-Legal purposes, it is also important to point out that variation in procedural techniques and pharmacological choices are the acceptable norm. The indications, contraindications, technique, and results of the above procedure should only be interpreted  and judged by a Board-Certified Interventional  Pain Specialist with extensive familiarity and expertise in the same exact procedure and technique.

## 2022-12-10 NOTE — Patient Instructions (Signed)

## 2022-12-10 NOTE — Progress Notes (Signed)
Nursing Pain Medication Assessment:  Safety precautions to be maintained throughout the outpatient stay will include: orient to surroundings, keep bed in low position, maintain call bell within reach at all times, provide assistance with transfer out of bed and ambulation.  Medication Inspection Compliance: Pill count conducted under aseptic conditions, in front of the patient. Neither the pills nor the bottle was removed from the patient's sight at any time. Once count was completed pills were immediately returned to the patient in their original bottle.  Medication: Hydrocodone/APAP Pill/Patch Count: 5/30 Pill/Patch Appearance: Markings consistent with prescribed medication Bottle Appearance: Standard pharmacy container. Clearly labeled. Filled Date: 72 / 23 / 2023 Last Medication intake:  TodaySafety precautions to be maintained throughout the outpatient stay will include: orient to surroundings, keep bed in low position, maintain call bell within reach at all times, provide assistance with transfer out of bed and ambulation.

## 2022-12-11 ENCOUNTER — Other Ambulatory Visit: Payer: Self-pay

## 2022-12-11 ENCOUNTER — Telehealth: Payer: Self-pay | Admitting: Student in an Organized Health Care Education/Training Program

## 2022-12-11 ENCOUNTER — Telehealth: Payer: Self-pay

## 2022-12-11 MED ORDER — HYDROCODONE-ACETAMINOPHEN 7.5-325 MG PO TABS: 1.0000 | ORAL_TABLET | Freq: Every day | ORAL | 0 refills | Status: AC | PRN

## 2022-12-11 NOTE — Telephone Encounter (Signed)
Patient aware.

## 2022-12-11 NOTE — Telephone Encounter (Signed)
Post procedure follow up. States she can tell the shot helped somem but is still hurting a lot in her hips.  Instructed her to document on her pain diary and to call us for any questions or concerns.

## 2022-12-11 NOTE — Telephone Encounter (Signed)
Medication refill request sent.

## 2022-12-11 NOTE — Telephone Encounter (Signed)
PT stated that her hydrocodone hadn't been send in yet. PT stated that while she was here on 12-10-22 a pill count was done and prescription was suppose to be send in to pharmacy. Please give patient a call. TY

## 2022-12-17 ENCOUNTER — Ambulatory Visit
Admission: RE | Admit: 2022-12-17 | Discharge: 2022-12-17 | Disposition: A | Discharge status: Home / Self Care | Payer: No Typology Code available for payment source | Source: Ambulatory Visit | Attending: Student in an Organized Health Care Education/Training Program | Admitting: Student in an Organized Health Care Education/Training Program

## 2022-12-17 DIAGNOSIS — M48061 Spinal stenosis, lumbar region without neurogenic claudication: Secondary | ICD-10-CM | POA: Insufficient documentation

## 2022-12-17 DIAGNOSIS — G894 Chronic pain syndrome: Secondary | ICD-10-CM | POA: Diagnosis not present

## 2022-12-17 DIAGNOSIS — M5126 Other intervertebral disc displacement, lumbar region: Secondary | ICD-10-CM | POA: Diagnosis not present

## 2022-12-17 DIAGNOSIS — M47816 Spondylosis without myelopathy or radiculopathy, lumbar region: Secondary | ICD-10-CM | POA: Insufficient documentation

## 2022-12-17 DIAGNOSIS — M419 Scoliosis, unspecified: Secondary | ICD-10-CM | POA: Insufficient documentation

## 2022-12-17 DIAGNOSIS — M4856XA Collapsed vertebra, not elsewhere classified, lumbar region, initial encounter for fracture: Secondary | ICD-10-CM | POA: Insufficient documentation

## 2022-12-18 ENCOUNTER — Ambulatory Visit: Payer: No Typology Code available for payment source

## 2022-12-18 ENCOUNTER — Ambulatory Visit
Payer: No Typology Code available for payment source | Attending: Student in an Organized Health Care Education/Training Program | Admitting: Student in an Organized Health Care Education/Training Program

## 2022-12-18 ENCOUNTER — Encounter: Payer: Self-pay | Admitting: Student in an Organized Health Care Education/Training Program

## 2022-12-18 VITALS — BP 122/58 | HR 96 | Temp 97.9°F | Ht 66.0 in | Wt 130.0 lb

## 2022-12-18 DIAGNOSIS — S32050A Wedge compression fracture of fifth lumbar vertebra, initial encounter for closed fracture: Secondary | ICD-10-CM | POA: Insufficient documentation

## 2022-12-18 DIAGNOSIS — G8929 Other chronic pain: Secondary | ICD-10-CM | POA: Diagnosis not present

## 2022-12-18 DIAGNOSIS — M5137 Other intervertebral disc degeneration, lumbosacral region: Secondary | ICD-10-CM

## 2022-12-18 DIAGNOSIS — M5441 Lumbago with sciatica, right side: Secondary | ICD-10-CM | POA: Diagnosis not present

## 2022-12-18 MED ORDER — OXYCODONE-ACETAMINOPHEN 10-325 MG PO TABS: 1.0000 | ORAL_TABLET | Freq: Three times a day (TID) | ORAL | 0 refills | Status: AC | PRN

## 2022-12-18 NOTE — Patient Instructions (Addendum)
Please call [facility] orthopedics and inform them we have a urgent referral to Dr Rudene Christians for L5 compression fracture for KYPHOPLASTY

## 2022-12-18 NOTE — Progress Notes (Signed)
Safety precautions to be maintained throughout the outpatient stay will include: orient to surroundings, keep bed in low position, maintain call bell within reach at all times, provide assistance with transfer out of bed and ambulation.  

## 2022-12-18 NOTE — Progress Notes (Signed)
PROVIDER NOTE: Information contained herein reflects review and annotations entered in association with encounter. Interpretation of such information and data should be left to medically-trained personnel. Information provided to patient can be located elsewhere in the medical record under "Patient Instructions". Document created using STT-dictation technology, any transcriptional errors that may result from process are unintentional.    Patient: Teresa [name]  Service Category: E/M  Provider: Gillis Santa, MD  DOB: 09/21/1956  DOS: 12/18/2022  Referring Provider: Tracie Harrier, MD  MRN: [id number]  Specialty: Interventional Pain Management  PCP: Tracie Harrier, MD  Type: Established Patient  Setting: Ambulatory outpatient    Location: Office  Delivery: Face-to-face     HPI  [name], a 67 y.o. year old female, is here today because of her Closed compression fracture of L5 vertebra, initial encounter ([place]) [S32.050A]. [name] primary complain today is Back Pain (lower) Last encounter: My last encounter with her was on 12/11/2022. Pertinent problems: Ms. Wahls has INSOMNIA, CHRONIC; DEPRESSION; BACK PAIN, CHRONIC; Lumbar radiculopathy (right L5); Lumbar spondylosis; Degeneration of lumbar or lumbosacral intervertebral disc; Chronic pain syndrome; Arthritis of left glenohumeral joint; and Left rotator cuff tear arthropathy on their pertinent problem list. Pain Assessment: Severity of Chronic pain is reported as a 10-Worst pain ever/10. Location: Back Right, Lower/radiates down to foot. Onset: More than a month ago. Quality: Aching, Constant, Numbness. Timing: Constant. Modifying factor(s): denies. Vitals:  height is 5' 6"$  (1.676 m) and weight is 130 lb (59 kg). Her temporal temperature is 97.9 F (36.6 C). Her blood pressure is 122/58 (abnormal) and her pulse is 96. Her oxygen saturation is 98%.  BMI: Estimated body mass index is 20.98 kg/m as calculated from the following:    Height as of this encounter: 5' 6"$  (1.676 m).   Weight as of this encounter: 130 lb (59 kg).  Reason for encounter: evaluation of worsening, or previously known (established) problem.   Severe low back pain, worse with standing, radiation into right hip and occasionally her right leg.  Lumbar MRI shows acute compression fracture at L5 with approximately 40% loss of superior endplate vertebral height.  No inciting or traumatic event but patient does have a history of osteoporosis. Urgent referral to Dr. Rudene Christians with [facility] to discuss kyphoplasty.  Pharmacotherapy Assessment  Analgesic: Transition from hydrocodone to oxycodone given severe acute pain from L5 compression fracture  Monitoring: [place] PMP: PDMP reviewed during this encounter.       Pharmacotherapy: No side-effects or adverse reactions reported. Compliance: No problems identified. Effectiveness: Clinically acceptable.  Arlice Colt, RN  12/18/2022  8:00 AM  Sign when Signing Visit Safety precautions to be maintained throughout the outpatient stay will include: orient to surroundings, keep bed in low position, maintain call bell within reach at all times, provide assistance with transfer out of bed and ambulation.     No results found for: "CBDTHCR" No results found for: "D8THCCBX" No results found for: "D9THCCBX"  UDS:  Summary  Date Value Ref Range Status  04/03/2020 Note  Final    Comment:    ==================================================================== [facility] (MW) ==================================================================== Test                             Result       Flag       Units Drug Absent but Declared for Prescription Verification   Oxycodone  Not Detected UNEXPECTED ng/mg creat ==================================================================== Test                      Result    Flag   Units      Ref Range   Creatinine              194               mg/dL      >=20 ==================================================================== Declared Medications:  The flagging and interpretation on this report are based on the  following declared medications.  Unexpected results may arise from  inaccuracies in the declared medications.  **Note: The testing scope of this panel includes these medications:  Oxycodone  **Note: The testing scope of this panel does not include the  following reported medications:  Acetaminophen  Alendronate (Fosamax)  Buspirone (Buspar)  Calcium  Cholecalciferol  Gabapentin (Neurontin)  Iron (Fergon)  Multivitamin  Nortriptyline (Pamelor)  Tizanidine (Zanaflex)  Topical  Topical Diclofenac  Topiramate (Topamax)  Trazodone (Desyrel)  Zolpidem (Ambien) ==================================================================== For clinical consultation, please call [phone]. ====================================================================       ROS  Constitutional: Denies any fever or chills Gastrointestinal: No reported hemesis, hematochezia, vomiting, or acute GI distress Musculoskeletal:  Severe low back and right hip pain Neurological: No reported episodes of acute onset apraxia, aphasia, dysarthria, agnosia, amnesia, paralysis, loss of coordination, or loss of consciousness  Medication Review  Calcium Carb-Cholecalciferol, Cholecalciferol, HYDROcodone-acetaminophen, Inulin, acetaminophen, alendronate, aspirin-acetaminophen-caffeine, cyanocobalamin, diclofenac, dicyclomine, ferrous gluconate, gabapentin, nortriptyline, oxyCODONE-acetaminophen, pantoprazole, tiZANidine, topiramate, vitamin C, and zolpidem  History Review  Allergy: [name] is allergic to elastic bandages & [zinc] and morphine. Drug: [name]  reports no history of drug use. Alcohol:  reports no history of alcohol use. Tobacco:  reports that she has never smoked. She has never used smokeless tobacco. Social: [name]   reports that she has never smoked. She has never used smokeless tobacco. She reports that she does not drink alcohol and does not use drugs. Medical:  has a past medical history of Dyspnea, Headache, History of fractured vertebra (1982), Hyperlipidemia, Low bone density, Melanoma ([place]), and Transfusion of blood product refused for religious reason. Surgical: [name]  has a past surgical history that includes Abdominal hysterectomy; Colonoscopy (11/03/1998); Eye surgery (Bilateral, 11/03/2012); Artery Biopsy (Left, 12/18/2015); melanoma excisison (Right); and Cholecystectomy. Family: family history includes Dementia in her mother; Heart attack in her father.  Laboratory Chemistry Profile   Renal Lab Results  Component Value Date   BUN 10 07/14/2022   CREATININE 0.70 07/14/2022   GFRAA >90 08/14/2014   GFRNONAA >60 07/14/2022    Hepatic Lab Results  Component Value Date   AST 14 (L) 07/13/2022   ALT 16 07/13/2022   ALBUMIN 3.6 07/13/2022   ALKPHOS 44 07/13/2022   LIPASE 141 (H) 07/13/2022    Electrolytes Lab Results  Component Value Date   NA 144 07/14/2022   K 3.5 07/14/2022   CL 120 (H) 07/14/2022   CALCIUM 8.0 (L) 07/14/2022   MG 2.1 07/13/2022    Bone No results found for: "VD25OH", "VD125OH2TOT", "[id number]", "[id number]", "25OHVITD1", "25OHVITD2", "25OHVITD3", "TESTOFREE", "TESTOSTERONE"  Inflammation (CRP: Acute Phase) (ESR: Chronic Phase) Lab Results  Component Value Date   LATICACIDVEN 1.1 07/13/2022         Note: Above Lab results reviewed.  Recent Imaging Review  MR LUMBAR SPINE WO CONTRAST CLINICAL DATA:  Low back pain, symptoms persist with > 6 wks treatment persisent low  back pain, severe scoliosisa  EXAM: MRI LUMBAR SPINE WITHOUT CONTRAST  TECHNIQUE: Multiplanar, multisequence MR imaging of the lumbar spine was performed. No intravenous contrast was administered.  COMPARISON:  MRI 12/01/2018.  FINDINGS: Segmentation: Transitional lumbosacral  anatomy. S1 is partially lumbarized.  Alignment:  No substantial sagittal subluxation.  Vertebrae: Acute/recent L5 superior endplate fracture with approximately 40% height loss. Associated bone marrow edema. No significant bony retropulsion.  Conus medullaris and cauda equina: Conus extends to the L1 level. Conus appears normal.  Paraspinal and other soft tissues: Unremarkable.  Disc levels:  T12-L1: No significant disc protrusion, foraminal stenosis, or canal stenosis.  L1-L2: Mild disc bulge.  No significant stenosis.  L2-L3: Mild disc bulge.  No significant stenosis.  L3-L4: Mild disc bulge.  No significant stenosis.  L4-L5: Mild disc bulging and bilateral facet arthropathy. Resulting mild right subarticular recess stenosis with mild left foraminal stenosis. Patent central canal.  L5-S1:Mild disc bulging bilateral facet arthropathy. Mild-to-moderate right and mild left foraminal stenosis. Patent central canal.  S1-S2: Transitional anatomy.  No significant stenosis.  IMPRESSION: 1. Transitional lumbosacral anatomy. S1 is partially lumbarized. Correlation with radiographs is recommended prior to any operative intervention. 2. Acute/recent L5 superior endplate fracture with approximately 40% height loss. 3. Mild-to-moderate right foraminal stenosis at L5-S1. Mild left foraminal stenosis at L4-L5.  These results will be called to the ordering clinician or representative by the Radiologist Assistant, and communication documented in the PACS or [employer].  Electronically Signed   By: Margaretha Sheffield M.D.   On: 12/17/2022 10:53 Note: Reviewed        Physical Exam  General appearance: Well nourished, well developed, and well hydrated. In no apparent acute distress Mental status: Alert, oriented x 3 (person, place, & time)       Respiratory: No evidence of acute respiratory distress Eyes: PERLA Vitals: BP (!) 122/58 (BP Location: Left Arm, Patient  Position: Sitting, Cuff Size: Normal)   Pulse 96   Temp 97.9 F (36.6 C) (Temporal)   Ht 5' 6"$  (1.676 m)   Wt 130 lb (59 kg)   SpO2 98%   BMI 20.98 kg/m  BMI: Estimated body mass index is 20.98 kg/m as calculated from the following:   Height as of this encounter: 5' 6"$  (1.676 m).   Weight as of this encounter: 130 lb (59 kg). Ideal: Ideal body weight: 59.3 kg (130 lb 11.7 oz)  Lumbar Spine Area Exam  Skin & Axial Inspection: No masses, redness, or swelling Alignment: Symmetrical Functional ROM: Decreased ROM affecting primarily the right Stability: No instability detected Muscle Tone/Strength: Functionally intact. No obvious neuro-muscular anomalies detected. Sensory (Neurological): Musculoskeletal pain pattern Palpation: Complains of area being tender to palpation        Gait & Posture Assessment  Ambulation: Limited Gait: Antalgic gait (limping) Posture: Difficulty standing up straight, due to pain  Lower Extremity Exam    Side: Right lower extremity  Side: Left lower extremity  Stability: No instability observed          Stability: No instability observed          Skin & Extremity Inspection: Skin color, temperature, and hair growth are WNL. No peripheral edema or cyanosis. No masses, redness, swelling, asymmetry, or associated skin lesions. No contractures.  Skin & Extremity Inspection: Skin color, temperature, and hair growth are WNL. No peripheral edema or cyanosis. No masses, redness, swelling, asymmetry, or associated skin lesions. No contractures.  Functional ROM: Pain restricted ROM for hip joint  Functional ROM: Unrestricted ROM                  Muscle Tone/Strength: Functionally intact. No obvious neuro-muscular anomalies detected.  Muscle Tone/Strength: Functionally intact. No obvious neuro-muscular anomalies detected.  Sensory (Neurological): Unimpaired        Sensory (Neurological): Unimpaired        DTR: Patellar: deferred today Achilles: deferred  today Plantar: deferred today  DTR: Patellar: deferred today Achilles: deferred today Plantar: deferred today  Palpation: No palpable anomalies  Palpation: No palpable anomalies    Assessment   Diagnosis Status  1. Closed compression fracture of L5 vertebra, initial encounter ([place])   2. Chronic right-sided low back pain with right-sided sciatica   3. Degeneration of lumbar or lumbosacral intervertebral disc    Persistent Persistent Persistent   Updated Problems: Problem  Closed Compression Fracture of Fifth Lumbar Vertebra ([facility])  Chronic Right-Sided Low Back Pain With Right-Sided Sciatica    Plan of Care    [name] has a current medication list which includes the following long-term medication(s): calcium carb-cholecalciferol, dicyclomine, ferrous gluconate, topiramate, zolpidem, and pantoprazole.  Pharmacotherapy (Medications Ordered): Meds ordered this encounter  Medications   oxyCODONE-acetaminophen (PERCOCET) 10-325 MG tablet    Sig: Take 1 tablet by mouth every 8 (eight) hours as needed for pain.    Dispense:  90 tablet    Refill:  0   Orders:  Orders Placed This Encounter  Procedures   Ambulatory referral to Orthopedics    Referral Priority:   Urgent    Referral Type:   Consultation    Referred to Provider:   Hessie Knows, MD    Requested Specialty:   Orthopedic Surgery    Number of Visits Requested:   1   Follow-up plan:   Return for after kyphoplasty.     Status post left L3, L4, L5, S1 RFA on 06/15/2019, R L3,4,5, S1 RFA on 06/29/2019, left posterior glenohumeral shoulder steroid injection 01/02/2020: Not effective.  Left L4 and Sprint peripheral nerve stimulation on 04/25/2020, removed 06/11/2020.  Left suprascapular nerve block 07/01/2021              Recent Visits Date Type Provider Dept  12/10/22 Procedure visit Gillis Santa, MD [facility]  12/09/22 Office Visit Gillis Santa, MD [facility]  11/26/22 Procedure visit  Gillis Santa, MD [facility]  11/25/22 Office Visit Gillis Santa, MD [facility]  Showing recent visits within past 90 days and meeting all other requirements Today's Visits Date Type Provider Dept  12/18/22 Office Visit Gillis Santa, MD [facility]  Showing today's visits and meeting all other requirements Future Appointments Date Type Provider Dept  01/07/23 Appointment Gillis Santa, MD [facility]  Showing future appointments within next 90 days and meeting all other requirements  I discussed the assessment and treatment plan with the patient. The patient was provided an opportunity to ask questions and all were answered. The patient agreed with the plan and demonstrated an understanding of the instructions.  Patient advised to call back or seek an in-person evaluation if the symptoms or condition worsens.  Duration of encounter: 85mnutes.  Total time on encounter, as per AMA guidelines included both the face-to-face and non-face-to-face time personally spent by the physician and/or other qualified health care professional(s) on the day of the encounter (includes time in activities that require the physician or other qualified health care professional and does not include time in activities  normally performed by clinical staff). Physician's time may include the following activities when performed: Preparing to see the patient (e.g., pre-charting review of records, searching for previously ordered imaging, lab work, and nerve conduction tests) Review of prior analgesic pharmacotherapies. Reviewing PMP Interpreting ordered tests (e.g., lab work, imaging, nerve conduction tests) Performing post-procedure evaluations, including interpretation of diagnostic procedures Obtaining and/or reviewing separately obtained history Performing a medically appropriate examination and/or evaluation Counseling and educating the patient/family/caregiver Ordering  medications, tests, or procedures Referring and communicating with other health care professionals (when not separately reported) Documenting clinical information in the electronic or other health record Independently interpreting results (not separately reported) and communicating results to the patient/ family/caregiver Care coordination (not separately reported)  Note by: Gillis Santa, MD Date: 12/18/2022; Time: 9:05 AM

## 2022-12-19 DIAGNOSIS — M5416 Radiculopathy, lumbar region: Secondary | ICD-10-CM | POA: Diagnosis not present

## 2022-12-24 ENCOUNTER — Ambulatory Visit
Payer: No Typology Code available for payment source | Admitting: Student in an Organized Health Care Education/Training Program

## 2022-12-24 DIAGNOSIS — M461 Sacroiliitis, not elsewhere classified: Secondary | ICD-10-CM | POA: Diagnosis not present

## 2022-12-25 ENCOUNTER — Telehealth (HOSPITAL_COMMUNITY): Payer: Self-pay

## 2022-12-25 NOTE — Telephone Encounter (Signed)
Called to schedule kyphoplasty, no answer, left vm. AB

## 2022-12-25 NOTE — Telephone Encounter (Signed)
Pt returned call. I asked if she wanted to schedule a kp with Dr. Estanislado Pandy. She is looking to schedule some sort of implant procedure which I told her that Dr. Estanislado Pandy doesn't do. She will think about it and call back if they decide to schedule. AB

## 2022-12-25 NOTE — Telephone Encounter (Signed)
Ok per Dr. Estanislado Pandy for L5 KP/VP. AB

## 2022-12-27 ENCOUNTER — Other Ambulatory Visit: Payer: Self-pay | Admitting: Student in an Organized Health Care Education/Training Program

## 2022-12-27 DIAGNOSIS — G894 Chronic pain syndrome: Secondary | ICD-10-CM

## 2022-12-29 ENCOUNTER — Ambulatory Visit
Payer: No Typology Code available for payment source | Admitting: Student in an Organized Health Care Education/Training Program

## 2023-01-07 ENCOUNTER — Ambulatory Visit
Payer: No Typology Code available for payment source | Attending: Student in an Organized Health Care Education/Training Program | Admitting: Student in an Organized Health Care Education/Training Program

## 2023-01-07 ENCOUNTER — Encounter: Payer: Self-pay | Admitting: Student in an Organized Health Care Education/Training Program

## 2023-01-07 DIAGNOSIS — G894 Chronic pain syndrome: Secondary | ICD-10-CM | POA: Diagnosis not present

## 2023-01-07 DIAGNOSIS — M533 Sacrococcygeal disorders, not elsewhere classified: Secondary | ICD-10-CM | POA: Diagnosis not present

## 2023-01-07 DIAGNOSIS — M47818 Spondylosis without myelopathy or radiculopathy, sacral and sacrococcygeal region: Secondary | ICD-10-CM

## 2023-01-07 NOTE — Progress Notes (Signed)
Patient: Teresa [name]  Service Category: E/M  Provider: Gillis Santa, MD  DOB: 1956-02-12  DOS: 01/07/2023  Location: Office  MRN: [id number]  Setting: Ambulatory outpatient  Referring Provider: Tracie Harrier, MD  Type: Established Patient  Specialty: Interventional Pain Management  PCP: Teresa Harrier, MD  Location: Remote location  Delivery: TeleHealth     Virtual Encounter - Pain Management PROVIDER NOTE: Information contained herein reflects review and annotations entered in association with encounter. Interpretation of such information and data should be left to medically-trained personnel. Information provided to patient can be located elsewhere in the medical record under "Patient Instructions". Document created using STT-dictation technology, any transcriptional errors that may result from process are unintentional.    Contact & Pharmacy Preferred: [place]: [phone] (home) Mobile: [phone] (mobile) E-mail: [email or web address]  [id number] [place] [facility] [address] [place]: [phone]: [phone] [id number] [place], [place] [address] [place]: [phone]: [phone] Pre-screening  Teresa [name] "in-person" vs "virtual" encounter. She indicated preferring virtual for this encounter.   Reason COVID-19*  Social distancing based on CDC and AMA recommendations.   I contacted Teresa [name] 01/07/2023 via telephone.      I clearly identified myself as BiGillis SantaMD. I verified that I was speaking with the correct person using two identifiers (Name: Teresa FENNINGand date of birth: 05/1956-04-21  Consent I sought verbal advanced consent from Teresa [name] interactions. I informed Teresa [name] possible security and privacy concerns, risks, and limitations associated with providing "not-in-person"  medical evaluation and management services. I also informed Ms. AsNishimuraf the availability of "in-person" appointments. Finally, I informed her that there would be a charge for the virtual [name] and that she could be  personally, fully or partially, financially responsible for it. Ms. AsKleinowxpressed understanding and agreed to proceed.   Historic Elements   Ms. DeAVAIA NISKANENs a [age].o. year old, female patient evaluated today after our last contact on 12/27/2022. Ms. AsMihelichhas a past medical history of Dyspnea, Headache, History of fractured vertebra (1982), Hyperlipidemia, Low bone density, Melanoma ([place] and Transfusion of blood product refused for religious reason. She also  has a past surgical history that includes Abdominal hysterectomy; Colonoscopy (11/03/1998); Eye surgery (Bilateral, 11/03/2012); Artery Biopsy (Left, 12/18/2015); melanoma excisison (Right); and Cholecystectomy. [name] a current medication list which includes the following prescription(s): acetaminophen, alendronate, vitamin c, aspirin-acetaminophen-caffeine, calcium carb-cholecalciferol, vitamin d3, [facility] vitamin b12, dicyclomine, ferrous gluconate, gabapentin, hydrocodone-acetaminophen, inulin, nortriptyline, oxycodone-acetaminophen, pantoprazole, tizanidine, topiramate, and zolpidem. She  reports that she has never smoked. She has never used smokeless tobacco. She reports that she does not drink alcohol and does not use drugs. [name] allergic to elastic bandages & [zinc] and morphine.  BMI: Estimated body mass index is 20.98 kg/m as calculated from the following:   Height as of 12/18/22: '5\' 6"'$  (1.676 m).   Weight as of 12/18/22: 130 lb (59 kg). Last encounter: 12/18/2022. Last procedure: 12/10/2022.  HPI  Today, she is being contacted for follow-up evaluation  Patient requested follow-up after her meeting with neurosurgery who did not recommend kyphoplasty for her condition.  She had a repeat right SI joint  injection done at neurosurgery which did provide approximately 65% pain relief for 2-1/2 weeks.  She has  also had an SI joint injection done with me in the past which she found helpful.  Given that her primary pain generator is her SI joint with short-term yet significant response to SI joint injections, we discussed blocking the nerves to the SI joint which would include a right L5 medial branch nerve block and right S1, S2, S3, S4 lateral branch nerve block.  Risk and benefits of this procedure were discussed in great detail.  If this diagnostic nerve block is helpful we could consider radiofrequency ablation of these nerves.  Patient would like to proceed with this.  Laboratory Chemistry Profile   Renal Lab Results  Component Value Date   BUN 10 07/14/2022   CREATININE 0.70 07/14/2022   GFRAA >90 08/14/2014   GFRNONAA >60 07/14/2022    Hepatic Lab Results  Component Value Date   AST 14 (L) 07/13/2022   ALT 16 07/13/2022   ALBUMIN 3.6 07/13/2022   ALKPHOS 44 07/13/2022   LIPASE 141 (H) 07/13/2022    Electrolytes Lab Results  Component Value Date   NA 144 07/14/2022   K 3.5 07/14/2022   CL 120 (H) 07/14/2022   CALCIUM 8.0 (L) 07/14/2022   MG 2.1 07/13/2022    Bone No results found for: "VD25OH", "VD125OH2TOT", "[id number]", "[id number]", "25OHVITD1", "25OHVITD2", "25OHVITD3", "TESTOFREE", "TESTOSTERONE"  Inflammation (CRP: Acute Phase) (ESR: Chronic Phase) Lab Results  Component Value Date   LATICACIDVEN 1.1 07/13/2022         Note: Above Lab results reviewed.  Imaging  MR LUMBAR SPINE WO CONTRAST CLINICAL DATA:  Low back pain, symptoms persist with > 6 wks treatment persisent low back pain, severe scoliosisa  EXAM: MRI LUMBAR SPINE WITHOUT CONTRAST  TECHNIQUE: Multiplanar, multisequence MR imaging of the lumbar spine was performed. No intravenous contrast was administered.  COMPARISON:  MRI 12/01/2018.  FINDINGS: Segmentation: Transitional lumbosacral anatomy. S1  is partially lumbarized.  Alignment:  No substantial sagittal subluxation.  Vertebrae: Acute/recent L5 superior endplate fracture with approximately 40% height loss. Associated bone marrow edema. No significant bony retropulsion.  Conus medullaris and cauda equina: Conus extends to the L1 level. Conus appears normal.  Paraspinal and other soft tissues: Unremarkable.  Disc levels:  T12-L1: No significant disc protrusion, foraminal stenosis, or canal stenosis.  L1-L2: Mild disc bulge.  No significant stenosis.  L2-L3: Mild disc bulge.  No significant stenosis.  L3-L4: Mild disc bulge.  No significant stenosis.  L4-L5: Mild disc bulging and bilateral facet arthropathy. Resulting mild right subarticular recess stenosis with mild left foraminal stenosis. Patent central canal.  L5-S1:Mild disc bulging bilateral facet arthropathy. Mild-to-moderate right and mild left foraminal stenosis. Patent central canal.  S1-S2: Transitional anatomy.  No significant stenosis.  IMPRESSION: 1. Transitional lumbosacral anatomy. S1 is partially lumbarized. Correlation with radiographs is recommended prior to any operative intervention. 2. Acute/recent L5 superior endplate fracture with approximately 40% height loss. 3. Mild-to-moderate right foraminal stenosis at L5-S1. Mild left foraminal stenosis at L4-L5.  These results will be called to the ordering clinician or representative by the Radiologist Assistant, and communication documented in the PACS or [employer].  Electronically Signed   By: Margaretha Sheffield M.D.   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 Diagnoses of Sacroiliac joint pain and Chronic pain syndrome were also pertinent to this [name].  Plan of Care    Orders:  Orders Placed This Encounter  Procedures   BLOCK OF NERVES TO THE SACROILIAC JOINT    Standing  Status:   Future    Standing Expiration Date:    04/09/2023    Scheduling Instructions:     L5 Medial Branch Blocks. S1, S2, S3, & S4 Lateral branch nerve block Side: RIGHT     Sedation:without     Timeframe: ASAP    Order Specific Question:   Where will this procedure be performed?    Answer:   [facility]   Follow-up plan:   Return in about 2 weeks (around 01/21/2023) for Right L5, S1, S2, S3, S4 nerve block, in clinic [facility].      Recent Visits Date Type Provider Dept  12/18/22 Office [name] Teresa Santa, MD [facility]  12/10/22 Procedure [name] Teresa Santa, MD [facility]  12/09/22 Office [name] Teresa Santa, MD [facility]  11/26/22 Procedure [name] Teresa Santa, MD [facility]  11/25/22 Office [name] Teresa Santa, MD [facility]  Showing recent visits within past 90 days and meeting all other requirements Today's Visits Date Type Provider Dept  01/07/23 Office [name] Teresa Santa, MD [facility]  Showing today's visits and meeting all other requirements Future Appointments No visits were found meeting these conditions. Showing future appointments within next 90 days and meeting all other requirements  I discussed the assessment and treatment plan with the patient. The patient was provided an opportunity to ask questions and all were answered. The patient agreed with the plan and demonstrated an understanding of the instructions.  Patient advised to call back or seek an in-person evaluation if the symptoms or condition worsens.  Duration of encounter: 44mnutes.  Note by: BGillis Santa MD Date: 01/07/2023; Time: 3:40 PM

## 2023-01-08 DIAGNOSIS — M533 Sacrococcygeal disorders, not elsewhere classified: Secondary | ICD-10-CM | POA: Diagnosis not present

## 2023-01-08 DIAGNOSIS — M5136 Other intervertebral disc degeneration, lumbar region: Secondary | ICD-10-CM | POA: Diagnosis not present

## 2023-01-08 DIAGNOSIS — M5451 Vertebrogenic low back pain: Secondary | ICD-10-CM | POA: Diagnosis not present

## 2023-01-09 DIAGNOSIS — H04123 Dry eye syndrome of bilateral lacrimal glands: Secondary | ICD-10-CM | POA: Diagnosis not present

## 2023-01-09 DIAGNOSIS — Z961 Presence of intraocular lens: Secondary | ICD-10-CM | POA: Diagnosis not present

## 2023-01-09 DIAGNOSIS — H538 Other visual disturbances: Secondary | ICD-10-CM | POA: Diagnosis not present

## 2023-01-09 DIAGNOSIS — H5213 Myopia, bilateral: Secondary | ICD-10-CM | POA: Diagnosis not present

## 2023-01-09 DIAGNOSIS — H52223 Regular astigmatism, bilateral: Secondary | ICD-10-CM | POA: Diagnosis not present

## 2023-01-16 DIAGNOSIS — M533 Sacrococcygeal disorders, not elsewhere classified: Secondary | ICD-10-CM | POA: Diagnosis not present

## 2023-01-28 ENCOUNTER — Ambulatory Visit
Payer: No Typology Code available for payment source | Admitting: Student in an Organized Health Care Education/Training Program

## 2023-02-18 ENCOUNTER — Encounter (HOSPITAL_COMMUNITY)
Admission: RE | Discharge status: Against Medical Advice | Payer: Self-pay | Source: Home / Self Care | Attending: Internal Medicine

## 2023-02-18 ENCOUNTER — Emergency Department (HOSPITAL_COMMUNITY): Payer: No Typology Code available for payment source

## 2023-02-18 ENCOUNTER — Other Ambulatory Visit: Payer: Self-pay

## 2023-02-18 ENCOUNTER — Inpatient Hospital Stay (HOSPITAL_COMMUNITY): Payer: No Typology Code available for payment source

## 2023-02-18 ENCOUNTER — Encounter (HOSPITAL_COMMUNITY): Payer: Self-pay | Admitting: Internal Medicine

## 2023-02-18 ENCOUNTER — Inpatient Hospital Stay (HOSPITAL_COMMUNITY): Payer: No Typology Code available for payment source | Admitting: Anesthesiology

## 2023-02-18 ENCOUNTER — Inpatient Hospital Stay (HOSPITAL_COMMUNITY)
Admission: RE | Admit: 2023-02-18 | Discharge: 2023-02-19 | DRG: 481 | Discharge status: Against Medical Advice | Payer: No Typology Code available for payment source | Attending: Internal Medicine | Admitting: Internal Medicine

## 2023-02-18 DIAGNOSIS — Z7983 Long term (current) use of bisphosphonates: Secondary | ICD-10-CM | POA: Diagnosis not present

## 2023-02-18 DIAGNOSIS — Z85820 Personal history of malignant melanoma of skin: Secondary | ICD-10-CM | POA: Diagnosis not present

## 2023-02-18 DIAGNOSIS — I10 Essential (primary) hypertension: Secondary | ICD-10-CM | POA: Diagnosis present

## 2023-02-18 DIAGNOSIS — Z5329 Procedure and treatment not carried out because of patient's decision for other reasons: Secondary | ICD-10-CM | POA: Diagnosis present

## 2023-02-18 DIAGNOSIS — Z888 Allergy status to other drugs, medicaments and biological substances status: Secondary | ICD-10-CM

## 2023-02-18 DIAGNOSIS — Z90710 Acquired absence of both cervix and uterus: Secondary | ICD-10-CM

## 2023-02-18 DIAGNOSIS — R54 Age-related physical debility: Secondary | ICD-10-CM | POA: Diagnosis present

## 2023-02-18 DIAGNOSIS — W010XXA Fall on same level from slipping, tripping and stumbling without subsequent striking against object, initial encounter: Secondary | ICD-10-CM | POA: Diagnosis present

## 2023-02-18 DIAGNOSIS — G43909 Migraine, unspecified, not intractable, without status migrainosus: Secondary | ICD-10-CM | POA: Diagnosis present

## 2023-02-18 DIAGNOSIS — R2681 Unsteadiness on feet: Secondary | ICD-10-CM | POA: Diagnosis not present

## 2023-02-18 DIAGNOSIS — M80852A Other osteoporosis with current pathological fracture, left femur, initial encounter for fracture: Secondary | ICD-10-CM | POA: Diagnosis not present

## 2023-02-18 DIAGNOSIS — S7292XA Unspecified fracture of left femur, initial encounter for closed fracture: Secondary | ICD-10-CM

## 2023-02-18 DIAGNOSIS — Z981 Arthrodesis status: Secondary | ICD-10-CM | POA: Diagnosis not present

## 2023-02-18 DIAGNOSIS — Z8249 Family history of ischemic heart disease and other diseases of the circulatory system: Secondary | ICD-10-CM | POA: Diagnosis not present

## 2023-02-18 DIAGNOSIS — M549 Dorsalgia, unspecified: Secondary | ICD-10-CM | POA: Diagnosis present

## 2023-02-18 DIAGNOSIS — D72829 Elevated white blood cell count, unspecified: Secondary | ICD-10-CM | POA: Diagnosis present

## 2023-02-18 DIAGNOSIS — Z79899 Other long term (current) drug therapy: Secondary | ICD-10-CM | POA: Diagnosis not present

## 2023-02-18 DIAGNOSIS — Z66 Do not resuscitate: Secondary | ICD-10-CM | POA: Diagnosis present

## 2023-02-18 DIAGNOSIS — Z9181 History of falling: Secondary | ICD-10-CM

## 2023-02-18 DIAGNOSIS — Z885 Allergy status to narcotic agent status: Secondary | ICD-10-CM

## 2023-02-18 DIAGNOSIS — G4700 Insomnia, unspecified: Secondary | ICD-10-CM | POA: Diagnosis present

## 2023-02-18 DIAGNOSIS — E785 Hyperlipidemia, unspecified: Secondary | ICD-10-CM | POA: Diagnosis not present

## 2023-02-18 DIAGNOSIS — S72302A Unspecified fracture of shaft of left femur, initial encounter for closed fracture: Secondary | ICD-10-CM

## 2023-02-18 DIAGNOSIS — D62 Acute posthemorrhagic anemia: Secondary | ICD-10-CM | POA: Diagnosis not present

## 2023-02-18 DIAGNOSIS — G894 Chronic pain syndrome: Secondary | ICD-10-CM | POA: Diagnosis present

## 2023-02-18 DIAGNOSIS — Z043 Encounter for examination and observation following other accident: Secondary | ICD-10-CM | POA: Diagnosis not present

## 2023-02-18 DIAGNOSIS — R000 Tachycardia, unspecified: Secondary | ICD-10-CM | POA: Diagnosis present

## 2023-02-18 DIAGNOSIS — S72335A Nondisplaced oblique fracture of shaft of left femur, initial encounter for closed fracture: Secondary | ICD-10-CM | POA: Diagnosis not present

## 2023-02-18 DIAGNOSIS — Y92007 Garden or yard of unspecified non-institutional (private) residence as the place of occurrence of the external cause: Secondary | ICD-10-CM

## 2023-02-18 DIAGNOSIS — R0902 Hypoxemia: Secondary | ICD-10-CM | POA: Diagnosis not present

## 2023-02-18 DIAGNOSIS — G709 Myoneural disorder, unspecified: Secondary | ICD-10-CM | POA: Diagnosis not present

## 2023-02-18 DIAGNOSIS — D509 Iron deficiency anemia, unspecified: Secondary | ICD-10-CM | POA: Diagnosis present

## 2023-02-18 DIAGNOSIS — S72002A Fracture of unspecified part of neck of left femur, initial encounter for closed fracture: Secondary | ICD-10-CM | POA: Diagnosis not present

## 2023-02-18 DIAGNOSIS — R0689 Other abnormalities of breathing: Secondary | ICD-10-CM | POA: Diagnosis not present

## 2023-02-18 DIAGNOSIS — S72332A Displaced oblique fracture of shaft of left femur, initial encounter for closed fracture: Principal | ICD-10-CM

## 2023-02-18 DIAGNOSIS — M797 Fibromyalgia: Secondary | ICD-10-CM | POA: Diagnosis not present

## 2023-02-18 HISTORY — PX: INTRAMEDULLARY (IM) NAIL INTERTROCHANTERIC: SHX5875

## 2023-02-18 LAB — CBC
HCT: 36.7 % (ref 36.0–46.0)
Hemoglobin: 12 g/dL (ref 12.0–15.0)
MCH: 28.5 pg (ref 26.0–34.0)
MCHC: 32.7 g/dL (ref 30.0–36.0)
MCV: 87.2 fL (ref 80.0–100.0)
Platelets: 370 10*3/uL (ref 150–400)
RBC: 4.21 MIL/uL (ref 3.87–5.11)
RDW: 13.4 % (ref 11.5–15.5)
WBC: 17.8 10*3/uL — ABNORMAL HIGH (ref 4.0–10.5)
nRBC: 0 % (ref 0.0–0.2)

## 2023-02-18 LAB — PROTIME-INR
INR: 1 (ref 0.8–1.2)
Prothrombin Time: 13 seconds (ref 11.4–15.2)

## 2023-02-18 LAB — I-STAT CHEM 8, ED
BUN: 18 mg/dL (ref 8–23)
Calcium, Ion: 1.11 mmol/L — ABNORMAL LOW (ref 1.15–1.40)
Chloride: 109 mmol/L (ref 98–111)
Creatinine, Ser: 1 mg/dL (ref 0.44–1.00)
Glucose, Bld: 154 mg/dL — ABNORMAL HIGH (ref 70–99)
HCT: 39 % (ref 36.0–46.0)
Hemoglobin: 13.3 g/dL (ref 12.0–15.0)
Potassium: 3.8 mmol/L (ref 3.5–5.1)
Sodium: 140 mmol/L (ref 135–145)
TCO2: 19 mmol/L — ABNORMAL LOW (ref 22–32)

## 2023-02-18 LAB — COMPREHENSIVE METABOLIC PANEL
ALT: 17 U/L (ref 0–44)
AST: 22 U/L (ref 15–41)
Albumin: 3.6 g/dL (ref 3.5–5.0)
Alkaline Phosphatase: 34 U/L — ABNORMAL LOW (ref 38–126)
Anion gap: 13 (ref 5–15)
BUN: 17 mg/dL (ref 8–23)
CO2: 19 mmol/L — ABNORMAL LOW (ref 22–32)
Calcium: 9 mg/dL (ref 8.9–10.3)
Chloride: 105 mmol/L (ref 98–111)
Creatinine, Ser: 0.96 mg/dL (ref 0.44–1.00)
GFR, Estimated: >60 mL/min (ref >60)
Glucose, Bld: 157 mg/dL — ABNORMAL HIGH (ref 70–99)
Potassium: 3.7 mmol/L (ref 3.5–5.1)
Sodium: 137 mmol/L (ref 135–145)
Total Bilirubin: 0.2 mg/dL — ABNORMAL LOW (ref 0.3–1.2)
Total Protein: 6.4 g/dL — ABNORMAL LOW (ref 6.5–8.1)

## 2023-02-18 LAB — LACTIC ACID, PLASMA: Lactic Acid, Venous: 2.6 mmol/L (ref 0.5–1.9)

## 2023-02-18 LAB — VITAMIN D 25 HYDROXY (VIT D DEFICIENCY, FRACTURES): Vit D, 25-Hydroxy: 47.69 ng/mL (ref 30–100)

## 2023-02-18 LAB — ETHANOL: Alcohol, Ethyl (B): <10 mg/dL (ref <10)

## 2023-02-18 LAB — NO BLOOD PRODUCTS

## 2023-02-18 SURGERY — FIXATION, FRACTURE, INTERTROCHANTERIC, WITH INTRAMEDULLARY ROD
Anesthesia: General | Laterality: Left

## 2023-02-18 MED ORDER — CEFAZOLIN SODIUM-DEXTROSE 2-4 GM/100ML-% IV SOLN
2.0000 g | Freq: Three times a day (TID) | INTRAVENOUS | Status: AC
  Administered 2023-02-19 (×2): 2 g via INTRAVENOUS
  Filled 2023-02-18 (×2): qty 100

## 2023-02-18 MED ORDER — KETAMINE HCL 50 MG/5ML IJ SOSY
PREFILLED_SYRINGE | INTRAMUSCULAR | Status: AC
  Administered 2023-02-18: 60 mg via INTRAVENOUS
  Filled 2023-02-18: qty 5

## 2023-02-18 MED ORDER — CALCIUM CARBONATE 1250 (500 CA) MG PO TABS: 1.0000 | ORAL_TABLET | Freq: Every day | ORAL | Status: DC

## 2023-02-18 MED ORDER — CHLORHEXIDINE GLUCONATE 4 % EX SOLN: 60.0000 mL | Freq: Once | CUTANEOUS | Status: DC

## 2023-02-18 MED ORDER — HYDROMORPHONE HCL 1 MG/ML IJ SOLN
1.0000 mg | INTRAMUSCULAR | Status: DC | PRN
  Administered 2023-02-19: 1 mg via INTRAVENOUS
  Filled 2023-02-18: qty 1

## 2023-02-18 MED ORDER — METHOCARBAMOL 1000 MG/10ML IJ SOLN
1000.0000 mg | Freq: Three times a day (TID) | INTRAVENOUS | Status: DC
  Administered 2023-02-18 – 2023-02-19 (×2): 1000 mg via INTRAVENOUS
  Filled 2023-02-18 (×2): qty 10
  Filled 2023-02-18: qty 1000
  Filled 2023-02-18 (×2): qty 10

## 2023-02-18 MED ORDER — ONDANSETRON HCL 4 MG/2ML IJ SOLN
INTRAMUSCULAR | Status: AC
  Filled 2023-02-18: qty 2

## 2023-02-18 MED ORDER — PHENYLEPHRINE 80 MCG/ML (10ML) SYRINGE FOR IV PUSH (FOR BLOOD PRESSURE SUPPORT)
PREFILLED_SYRINGE | INTRAVENOUS | Status: DC | PRN
  Administered 2023-02-18: 160 ug via INTRAVENOUS

## 2023-02-18 MED ORDER — ACETAMINOPHEN 500 MG PO TABS
1000.0000 mg | ORAL_TABLET | Freq: Three times a day (TID) | ORAL | Status: DC
  Administered 2023-02-19 (×2): 1000 mg via ORAL
  Filled 2023-02-18 (×2): qty 2

## 2023-02-18 MED ORDER — ZOLPIDEM TARTRATE 5 MG PO TABS
5.0000 mg | ORAL_TABLET | Freq: Every evening | ORAL | Status: DC | PRN
  Administered 2023-02-18: 5 mg via ORAL
  Filled 2023-02-18: qty 1

## 2023-02-18 MED ORDER — LIDOCAINE 2% (20 MG/ML) 5 ML SYRINGE
INTRAMUSCULAR | Status: DC | PRN
  Administered 2023-02-18: 80 mg via INTRAVENOUS

## 2023-02-18 MED ORDER — PROMETHAZINE HCL 25 MG/ML IJ SOLN: 6.2500 mg | INTRAMUSCULAR | Status: DC | PRN

## 2023-02-18 MED ORDER — FENTANYL CITRATE (PF) 100 MCG/2ML IJ SOLN
INTRAMUSCULAR | Status: AC
  Filled 2023-02-18: qty 2

## 2023-02-18 MED ORDER — MIDAZOLAM HCL 2 MG/2ML IJ SOLN
INTRAMUSCULAR | Status: AC
  Filled 2023-02-18: qty 2

## 2023-02-18 MED ORDER — POVIDONE-IODINE 10 % EX SWAB
2.0000 | Freq: Once | CUTANEOUS | Status: AC
  Administered 2023-02-18: 2 via TOPICAL

## 2023-02-18 MED ORDER — KETAMINE HCL 50 MG/5ML IJ SOSY: 20.0000 mg | PREFILLED_SYRINGE | Freq: Once | INTRAMUSCULAR | Status: AC

## 2023-02-18 MED ORDER — KETAMINE HCL 50 MG/5ML IJ SOSY: 1.0000 mg/kg | PREFILLED_SYRINGE | Freq: Once | INTRAMUSCULAR | Status: DC

## 2023-02-18 MED ORDER — VITAMIN D 25 MCG (1000 UNIT) PO TABS: 1000.0000 [IU] | ORAL_TABLET | Freq: Every day | ORAL | Status: DC

## 2023-02-18 MED ORDER — ORAL CARE MOUTH RINSE: 15.0000 mL | Freq: Once | OROMUCOSAL | Status: AC

## 2023-02-18 MED ORDER — TOPIRAMATE 25 MG PO TABS
100.0000 mg | ORAL_TABLET | Freq: Every day | ORAL | Status: DC
  Filled 2023-02-18: qty 4

## 2023-02-18 MED ORDER — FENTANYL CITRATE (PF) 250 MCG/5ML IJ SOLN
INTRAMUSCULAR | Status: AC
  Filled 2023-02-18: qty 5

## 2023-02-18 MED ORDER — HYDROMORPHONE HCL 1 MG/ML IJ SOLN
1.0000 mg | Freq: Once | INTRAMUSCULAR | Status: AC
  Administered 2023-02-18: 1 mg via INTRAVENOUS
  Filled 2023-02-18: qty 1

## 2023-02-18 MED ORDER — HYDROMORPHONE HCL 1 MG/ML IJ SOLN
1.0000 mg | INTRAMUSCULAR | Status: DC | PRN
  Administered 2023-02-18: .5 mg via INTRAVENOUS

## 2023-02-18 MED ORDER — TRANEXAMIC ACID-NACL 1000-0.7 MG/100ML-% IV SOLN
1000.0000 mg | INTRAVENOUS | Status: AC
  Administered 2023-02-18 (×2): 1000 mg via INTRAVENOUS
  Filled 2023-02-18: qty 100

## 2023-02-18 MED ORDER — ONDANSETRON HCL 4 MG/2ML IJ SOLN
INTRAMUSCULAR | Status: DC | PRN
  Administered 2023-02-18: 4 mg via INTRAVENOUS

## 2023-02-18 MED ORDER — PHENYLEPHRINE HCL (PRESSORS) 10 MG/ML IV SOLN
INTRAVENOUS | Status: AC
  Filled 2023-02-18: qty 1

## 2023-02-18 MED ORDER — ALBUMIN HUMAN 5 % IV SOLN: INTRAVENOUS | Status: DC | PRN

## 2023-02-18 MED ORDER — FENTANYL CITRATE (PF) 250 MCG/5ML IJ SOLN
INTRAMUSCULAR | Status: DC | PRN
  Administered 2023-02-18: 25 ug via INTRAVENOUS
  Administered 2023-02-18: 50 ug via INTRAVENOUS

## 2023-02-18 MED ORDER — PANTOPRAZOLE SODIUM 40 MG PO TBEC: 40.0000 mg | DELAYED_RELEASE_TABLET | Freq: Every day | ORAL | Status: DC

## 2023-02-18 MED ORDER — FENTANYL CITRATE PF 50 MCG/ML IJ SOSY
PREFILLED_SYRINGE | INTRAMUSCULAR | Status: AC
  Filled 2023-02-18: qty 1

## 2023-02-18 MED ORDER — PHENYLEPHRINE HCL-NACL 20-0.9 MG/250ML-% IV SOLN
INTRAVENOUS | Status: DC | PRN
  Administered 2023-02-18: 80 ug via INTRAVENOUS
  Administered 2023-02-18: 30 ug/min via INTRAVENOUS

## 2023-02-18 MED ORDER — MIDAZOLAM HCL 2 MG/2ML IJ SOLN
INTRAMUSCULAR | Status: DC | PRN
  Administered 2023-02-18: 2 mg via INTRAVENOUS

## 2023-02-18 MED ORDER — CEFAZOLIN SODIUM-DEXTROSE 2-4 GM/100ML-% IV SOLN
2.0000 g | INTRAVENOUS | Status: AC
  Administered 2023-02-18: 2 g via INTRAVENOUS
  Filled 2023-02-18: qty 100

## 2023-02-18 MED ORDER — POLYETHYLENE GLYCOL 3350 17 G PO PACK: 17.0000 g | PACK | Freq: Every day | ORAL | Status: DC | PRN

## 2023-02-18 MED ORDER — KETOROLAC TROMETHAMINE 15 MG/ML IJ SOLN
15.0000 mg | Freq: Four times a day (QID) | INTRAMUSCULAR | Status: DC | PRN
  Administered 2023-02-18 – 2023-02-19 (×2): 15 mg via INTRAVENOUS
  Filled 2023-02-18 (×2): qty 1

## 2023-02-18 MED ORDER — FENTANYL CITRATE (PF) 100 MCG/2ML IJ SOLN
25.0000 ug | INTRAMUSCULAR | Status: DC | PRN
  Administered 2023-02-18 (×2): 50 ug via INTRAVENOUS

## 2023-02-18 MED ORDER — LACTATED RINGERS IV BOLUS
500.0000 mL | Freq: Once | INTRAVENOUS | Status: AC
  Administered 2023-02-18: 500 mL via INTRAVENOUS

## 2023-02-18 MED ORDER — LACTATED RINGERS IV SOLN: INTRAVENOUS | Status: DC

## 2023-02-18 MED ORDER — CHLORHEXIDINE GLUCONATE 0.12 % MT SOLN
OROMUCOSAL | Status: AC
  Administered 2023-02-18: 15 mL via OROMUCOSAL
  Filled 2023-02-18: qty 15

## 2023-02-18 MED ORDER — DEXAMETHASONE SODIUM PHOSPHATE 10 MG/ML IJ SOLN
INTRAMUSCULAR | Status: DC | PRN
  Administered 2023-02-18: 5 mg via INTRAVENOUS

## 2023-02-18 MED ORDER — HYDROMORPHONE HCL 1 MG/ML IJ SOLN
INTRAMUSCULAR | Status: AC
  Filled 2023-02-18: qty 0.5

## 2023-02-18 MED ORDER — TRANEXAMIC ACID-NACL 1000-0.7 MG/100ML-% IV SOLN
INTRAVENOUS | Status: AC
  Filled 2023-02-18: qty 100

## 2023-02-18 MED ORDER — ROCURONIUM BROMIDE 10 MG/ML (PF) SYRINGE
PREFILLED_SYRINGE | INTRAVENOUS | Status: DC | PRN
  Administered 2023-02-18: 60 mg via INTRAVENOUS
  Administered 2023-02-18: 10 mg via INTRAVENOUS
  Administered 2023-02-18: 20 mg via INTRAVENOUS

## 2023-02-18 MED ORDER — FENTANYL CITRATE PF 50 MCG/ML IJ SOSY
PREFILLED_SYRINGE | INTRAMUSCULAR | Status: AC | PRN
  Administered 2023-02-18: 25 ug via INTRAVENOUS

## 2023-02-18 MED ORDER — GABAPENTIN 300 MG PO CAPS
600.0000 mg | ORAL_CAPSULE | Freq: Two times a day (BID) | ORAL | Status: DC
  Administered 2023-02-18: 600 mg via ORAL
  Filled 2023-02-18: qty 2

## 2023-02-18 MED ORDER — KETAMINE HCL 50 MG/5ML IJ SOSY
PREFILLED_SYRINGE | INTRAMUSCULAR | Status: AC
  Administered 2023-02-18: 20 mg via INTRAVENOUS
  Filled 2023-02-18: qty 5

## 2023-02-18 MED ORDER — KETAMINE HCL 50 MG/5ML IJ SOSY: 60.0000 mg | PREFILLED_SYRINGE | Freq: Once | INTRAMUSCULAR | Status: AC

## 2023-02-18 MED ORDER — HYDROMORPHONE HCL 1 MG/ML IJ SOLN
0.5000 mg | Freq: Once | INTRAMUSCULAR | Status: AC
  Administered 2023-02-18: 0.5 mg via INTRAVENOUS
  Filled 2023-02-18: qty 1

## 2023-02-18 MED ORDER — ACETAMINOPHEN 500 MG PO TABS
1000.0000 mg | ORAL_TABLET | Freq: Three times a day (TID) | ORAL | Status: DC
  Filled 2023-02-18: qty 2

## 2023-02-18 MED ORDER — CHLORHEXIDINE GLUCONATE 0.12 % MT SOLN: 15.0000 mL | Freq: Once | OROMUCOSAL | Status: AC

## 2023-02-18 MED ORDER — OXYCODONE HCL 5 MG PO TABS: 5.0000 mg | ORAL_TABLET | ORAL | Status: DC | PRN

## 2023-02-18 MED ORDER — NORTRIPTYLINE HCL 25 MG PO CAPS
50.0000 mg | ORAL_CAPSULE | Freq: Every day | ORAL | Status: DC
  Filled 2023-02-18: qty 2

## 2023-02-18 MED ORDER — ONDANSETRON HCL 4 MG/2ML IJ SOLN
INTRAMUSCULAR | Status: AC | PRN
  Administered 2023-02-18: 4 mg via INTRAVENOUS

## 2023-02-18 MED ORDER — PROPOFOL 10 MG/ML IV BOLUS
INTRAVENOUS | Status: DC | PRN
  Administered 2023-02-18: 130 mg via INTRAVENOUS
  Administered 2023-02-18: 50 mg via INTRAVENOUS

## 2023-02-18 MED ORDER — SUGAMMADEX SODIUM 200 MG/2ML IV SOLN
INTRAVENOUS | Status: DC | PRN
  Administered 2023-02-18: 200 mg via INTRAVENOUS

## 2023-02-18 MED ORDER — 0.9 % SODIUM CHLORIDE (POUR BTL) OPTIME
TOPICAL | Status: DC | PRN
  Administered 2023-02-18: 1000 mL

## 2023-02-18 MED ORDER — ACETAMINOPHEN 650 MG RE SUPP: 650.0000 mg | Freq: Three times a day (TID) | RECTAL | Status: DC

## 2023-02-18 MED ORDER — ASPIRIN 81 MG PO TBEC: 81.0000 mg | DELAYED_RELEASE_TABLET | Freq: Two times a day (BID) | ORAL | Status: DC

## 2023-02-18 MED ORDER — FENTANYL CITRATE (PF) 100 MCG/2ML IJ SOLN
INTRAMUSCULAR | Status: AC
  Administered 2023-02-18: 100 ug via INTRAVENOUS
  Filled 2023-02-18: qty 2

## 2023-02-18 MED ORDER — FENTANYL CITRATE (PF) 100 MCG/2ML IJ SOLN: 100.0000 ug | Freq: Once | INTRAMUSCULAR | Status: AC

## 2023-02-18 MED ORDER — OXYCODONE HCL 5 MG PO TABS
5.0000 mg | ORAL_TABLET | ORAL | Status: DC | PRN
  Filled 2023-02-18: qty 2

## 2023-02-18 SURGICAL SUPPLY — 42 items
BIT DRILL LAG SCREW META-TAN (DRILL) IMPLANT
BIT DRILL SHORT 4.0 (BIT) IMPLANT
BNDG COHESIVE 6X5 TAN NS LF (GAUZE/BANDAGES/DRESSINGS) ×1 IMPLANT
CANISTER SUCT 3000ML PPV (MISCELLANEOUS) ×1 IMPLANT
COVER PERINEAL POST (MISCELLANEOUS) ×1 IMPLANT
COVER SURGICAL LIGHT HANDLE (MISCELLANEOUS) ×1 IMPLANT
DRAPE C-ARM 42X72 X-RAY (DRAPES) ×1 IMPLANT
DRAPE C-ARMOR (DRAPES) IMPLANT
DRAPE STERI IOBAN 125X83 (DRAPES) ×1 IMPLANT
DRILL BIT SHORT 4.0 (BIT) ×2
DRILL LAG SCREW META-TAN (DRILL) ×1
DRSG TEGADERM 4X4.75 (GAUZE/BANDAGES/DRESSINGS) IMPLANT
DURAPREP 26ML APPLICATOR (WOUND CARE) ×1 IMPLANT
ELECT REM PT RETURN 9FT ADLT (ELECTROSURGICAL) ×1
ELECTRODE REM PT RTRN 9FT ADLT (ELECTROSURGICAL) ×1 IMPLANT
GAUZE PAD ABD 8X10 STRL (GAUZE/BANDAGES/DRESSINGS) ×1 IMPLANT
GAUZE SPONGE 4X4 12PLY STRL (GAUZE/BANDAGES/DRESSINGS) ×1 IMPLANT
GAUZE SPONGE 4X4 12PLY STRL LF (GAUZE/BANDAGES/DRESSINGS) IMPLANT
GAUZE XEROFORM 1X8 LF (GAUZE/BANDAGES/DRESSINGS) ×1 IMPLANT
GLOVE BIOGEL PI IND STRL 8 (GLOVE) ×1 IMPLANT
GLOVE PI ORTHO PRO STRL 7.5 (GLOVE) ×1 IMPLANT
GOWN STRL REUS W/TWL XL LVL3 (GOWN DISPOSABLE) ×1 IMPLANT
GUIDE PIN 3.2X343 (PIN) ×2
GUIDE PIN 3.2X343MM (PIN) ×2
GUIDE ROD 3.0 (MISCELLANEOUS) ×1
KIT BASIN OR (CUSTOM PROCEDURE TRAY) ×1 IMPLANT
KIT TURNOVER KIT B (KITS) ×1 IMPLANT
MANIFOLD NEPTUNE II (INSTRUMENTS) ×1 IMPLANT
NAIL IM CANN TRIG 11.5X400 (Nail) IMPLANT
NS IRRIG 1000ML POUR BTL (IV SOLUTION) ×1 IMPLANT
PACK GENERAL/GYN (CUSTOM PROCEDURE TRAY) ×1 IMPLANT
PAD ARMBOARD 7.5X6 YLW CONV (MISCELLANEOUS) ×1 IMPLANT
PIN GUIDE 3.2X343MM (PIN) IMPLANT
ROD GUIDE 3.0 (MISCELLANEOUS) IMPLANT
SCREW COMP LAG 90X85 (Screw) IMPLANT
SCREW TRIGEN LOW PROF 5.0X35 (Screw) IMPLANT
SCREW TRIGEN LOW PROF 5.0X37.5 (Screw) IMPLANT
STAPLER VISISTAT 35W (STAPLE) IMPLANT
SUT VIC AB 0 CT1 18XCR BRD 8 (SUTURE) ×1 IMPLANT
SUT VIC AB 0 CT1 8-18 (SUTURE)
SUT VIC AB 2-0 CT1 18 (SUTURE) ×1 IMPLANT
WATER STERILE IRR 1000ML POUR (IV SOLUTION) ×1 IMPLANT

## 2023-02-18 NOTE — Brief Op Note (Signed)
02/18/2023  8:26 PM  PATIENT:  Teresa [name]  68 y.o. female  PRE-OPERATIVE DIAGNOSIS:  left femur fracture  POST-OPERATIVE DIAGNOSIS:  left femur fracture  PROCEDURE:  Procedure(s): INTRAMEDULLARY (IM) NAIL FEMUR ANTERGRADE (Left)  SURGEON:  Surgeon(s) and Role:    London Sheer, MD - Primary  PHYSICIAN ASSISTANT:   ASSISTANTS: none   ANESTHESIA:   general  EBL:  200 mL   BLOOD ADMINISTERED:none  DRAINS: none   LOCAL MEDICATIONS USED:  NONE  SPECIMEN:  No Specimen  DISPOSITION OF SPECIMEN:  N/A  COUNTS:  YES  TOURNIQUET: NONE  DICTATION: .Note written in EPIC  PLAN OF CARE: Admit to inpatient   PATIENT DISPOSITION:  PACU - hemodynamically stable.   Delay start of Pharmacological VTE agent (>24hrs) due to surgical blood loss or risk of bleeding: no

## 2023-02-18 NOTE — Transfer of Care (Signed)
Immediate Anesthesia Transfer of Care Note  Patient: MIEKA LEATON  Procedure(s) Performed: INTRAMEDULLARY (IM) NAIL FEMUR ANTERGRADE (Left)  Patient Location: PACU  Anesthesia Type:General  Level of Consciousness: awake, alert , and oriented  Airway & Oxygen Therapy: Patient Spontanous Breathing and Patient connected to nasal cannula oxygen  Post-op Assessment: Report given to RN, Post -op Vital signs reviewed and stable, and Patient moving all extremities  Post vital signs: Reviewed and stable  Last Vitals:  Vitals Value Taken Time  BP 102/59 02/18/23 2018  Temp    Pulse 95 02/18/23 2020  Resp 13 02/18/23 2020  SpO2 100 % 02/18/23 2020  Vitals shown include unvalidated device data.  Last Pain:  Vitals:   02/18/23 1624  TempSrc:   PainSc: 0-No pain         Complications: No notable events documented.

## 2023-02-18 NOTE — Discharge Instructions (Signed)
Orthopedic Surgery Discharge Instructions  Patient name: Teresa [name] Fracture: Left femur fracture Procedure Performed: Left femur intramedullary rodding Date of Surgery: 02/18/2023 Surgeon: Willia Craze, MD  Activity: You are allowed to put as much weight on your leg as you would like. You can walk as much as you would like. You can perform household activities such as cleaning dishes, doing laundry, vacuuming, etc.  Incision Care: Your incision site has a dressing over it. That dressing should remain in place and dry at all times for a total of one week after surgery. After one week, you can remove the dressing. Underneath the dressing, you will find skin staples. You should leave these staples in place. They will be taken out in the office when the wound has healed. Do not pick, rub, or scrub at them. Do not put cream or lotion over the surgical area. After one week and once the dressing is off, it is okay to let soap and water run over your incision. Again, do not pick, scrub, or rub at the staples when bathing. Do not submerge (e.g., take a bath, swim, go in a hot tub, etc.) until six weeks after surgery. There may be some bloody drainage from the incision into the dressing after surgery. This is normal. You do not need to replace the dressing. Continue to leave it in place for the one week as instructed above. Should the dressing become saturated with blood or drainage, please call the office for further instructions.   Medications: You have been prescribed oxycodone. This is a narcotic pain medication and should only be taken as prescribed. You should not drink alcohol or operate heavy machinery (including driving) while taking this medication. The oxycodone can cause constipation as a side effect. For that reason, you have been prescribed senna and miralax. These are both laxatives. You do not need to take this medication if you develop diarrhea. Should you remain constipated even while  taking the senna and miralax, please use the miralax twice daily. Tylenol has been prescribed to be taken every 8 hours, which will give you additional pain relief.   You have been prescribed aspirin as a blood thinner. This medication is to be taken to prevent blood clots. Take 81 milligrams twice daily. You should refrain from using other blood thinners (warfarin, apixaban, plavix, xarelto, etc.) while using the aspirin. You will need to take this medication for a total of 6 weeks after your surgery.   You should not use over-the-counter NSAIDs (ibuprofen, Aleve, Celebrex, naproxen, meloxicam, etc.) for pain relief because aspirin is a similar medication. There can be side effects including but not limited to kidney injury and ulcers if you take these type of medications with the aspirin.  In order to set expectations for opioid prescriptions, you will only be prescribed opioids for a total of six weeks after surgery and, at two-weeks after surgery, your opioid prescription will start to tapered (decreased dosage and number of pills). If you have ongoing need for opioid medication six weeks after surgery, you will be referred to pain management. If you are already established with a provider that is giving you opioid medications, you should schedule an appointment with them for six weeks after surgery if you feel you are going to need another prescription. State law only allows for opioid prescriptions one week at a time. If you are running out of opioid medication near the end of the week, please call the office during business hours before running  out so I can send you another prescription.   Driving: You should not drive while taking narcotic pain medications. You should start getting back to driving slowly and you may want to try driving in a parking lot before doing anything more.   Diet: You are safe to resume your regular diet after surgery.   Reasons to Call the Office After Surgery: You should  feel free to call the office with any concerns or questions you have in the post-operative period, but you should definitely notify the office if you develop: -shortness of breath, chest pain, or trouble breathing -excessive bleeding, drainage, redness, or swelling around the surgical site -fevers, chills, or pain that is getting worse with each passing day -persistent nausea or vomiting -new weakness in the left leg, new or worsening numbness or tingling in the left leg -other concerns about your surgery  Follow Up Appointments: You should call the office to schedule a follow up appointment around 03/06/2023. We will get x-rays at that visit. The office phone number to schedule that appointment and the address are listed below.   Office Information:  -Willia Craze, MD -Phone number: [phone] -Address: [address]       [place], [place] [address]

## 2023-02-18 NOTE — Anesthesia Postprocedure Evaluation (Signed)
Anesthesia Post Note  Patient: Teresa [name]  Procedure(s) Performed: INTRAMEDULLARY (IM) NAIL FEMUR ANTERGRADE (Left)     Patient location during evaluation: PACU Anesthesia Type: General Level of consciousness: sedated Pain management: pain level controlled Vital Signs Assessment: post-procedure vital signs reviewed and stable Respiratory status: spontaneous breathing and respiratory function stable Cardiovascular status: stable Postop Assessment: no apparent nausea or vomiting Anesthetic complications: no   No notable events documented.  Last Vitals:  Vitals:   02/18/23 2030 02/18/23 2045  BP: 97/61 97/60  Pulse: (!) 102 (!) 105  Resp: 19 13  Temp:    SpO2: 97% 100%    Last Pain:  Vitals:   02/18/23 2030  TempSrc:   PainSc: Asleep                 Kristion Holifield DANIEL

## 2023-02-18 NOTE — Procedures (Signed)
Procedure: Left femur closed reduction   Indication: Left femur fracture   Surgeon: Charma Igo, PA-C   Assist: None   Anesthesia: Ketamine via EDP   EBL: None   Complications: None   Findings: After risks/benefits explained patient desires to undergo procedure. Consent obtained and time out performed. Sedation given and confirmed. Femur reduced and pt placed in [place] traction. Pt tolerated the procedure well. X-rays pending.       Teresa Caldron, PA-C Orthopedic Surgery [phone]

## 2023-02-18 NOTE — H&P (Cosign Needed Addendum)
Date: 02/18/2023               Patient Name:  Teresa [name] MRN: [id number]  DOB: 1955-12-26 Age / Sex: 67 y.o., female   PCP: Barbette Reichmann, MD         Medical Service: Internal Medicine Teaching Service         Attending Physician: Dr. Dickie La, MD    First Contact: Dr. Morene Crocker, MD Pager: [phone]  Second Contact: Dr. Elza Rafter, DO Pager: [phone]       After Hours (After 5p/  First Contact Pager: [phone]  weekends / holidays): Second Contact Pager: [phone]   Chief Complaint: Left leg pain  History of Present Illness:  Teresa [name] is a 21 year older person living with osteoporosis with history of L5 compression fracture, HTN,  chronic migraines, GAD who presents for fall this morning. She fell while refilling the bird feeder outside. Reports slate tiles were slippery causing her to trip. Reports she is typically not very steady on her feet. Denies any other symptoms precipitating her fall. Denies head trauma or LOC, or other pain from the fall. History limited by patient's pain. She denies fever, chills, chest pain, shortness of breath, abdominal pain, N/V/D, urinary symptoms, weakness, or LOC.   Meds: Current Meds  Medication Sig   acetaminophen (TYLENOL) 500 MG tablet Take 500-1,000 mg by mouth every 6 (six) hours as needed (pain.).   alendronate (FOSAMAX) 70 MG tablet Take 1 tablet (70 mg total) by mouth every Wednesday. Take with a full glass of water on an empty stomach.   Ascorbic Acid (VITAMIN C) 1000 MG tablet Take 1,000 mg by mouth daily before lunch.   aspirin-acetaminophen-caffeine (EXCEDRIN MIGRAINE) 250-250-65 MG tablet Take 1 tablet by mouth 3 (three) times daily as needed for headache.   Calcium Carb-Cholecalciferol (CALTRATE 600+D3 PO) Take 1 tablet by mouth daily with breakfast.   Cholecalciferol (VITAMIN D3) 125 MCG (5000 UT) TABS Take 5,000 Units by mouth daily with breakfast.   dicyclomine (BENTYL) 20 MG tablet Take 20 mg by mouth  4 (four) times daily as needed (bloating/indigestion.).   ferrous gluconate (FERGON) 324 MG tablet Take 648 mg by mouth daily before lunch.   gabapentin (NEURONTIN) 300 MG capsule Take 600 mg by mouth 2 (two) times daily. With breakfast & with lunch   Inulin (FIBER CHOICE PO) Take 1 tablet by mouth at bedtime. Health Plus Super Colon Cleanse   nortriptyline (PAMELOR) 10 MG capsule Take 50 mg by mouth daily with lunch.   pantoprazole (PROTONIX) 40 MG tablet Take by mouth.   topiramate (TOPAMAX) 100 MG tablet Take 100 mg by mouth at bedtime.   zolpidem (AMBIEN CR) 12.5 MG CR tablet Take 1 tablet by mouth at bedtime as needed.    Allergies: Allergies as of 02/18/2023 - Review Complete 02/18/2023  Allergen Reaction Noted   Elastic bandages & [zinc] Rash 01/02/2020   Morphine Rash 04/13/2015   Past Medical History:  Diagnosis Date   Dyspnea    Headache    migraines   History of fractured vertebra 1982   3x   Hyperlipidemia    Low bone density    Melanoma (HCC)    Resected from Right foot.    Transfusion of blood product refused for religious reason     Family History: No know medical history  Social History: Retired, lives with husband. Independent of ADLs and IDLs. Denies alcohol, tobacco, or illicit drug use. Patient is  a Jehovah's Witness will need to discuss with her if she were to need a transfusion.  Review of Systems: A complete ROS was negative except as per HPI.   Physical Exam: Blood pressure 125/62, pulse 92, temperature 97.9 F (36.6 C), resp. rate (!) 21, height  (1.676 m), weight 59 kg, SpO2 100 %. Constitutional: Frail appearing, laying in bed in traction, appears uncomfortable and restless HENT: Normocephalic and atraumatic, EOMI, conjunctiva normal, moist mucous membranes Cardiovascular: Normal rate, regular rhythm, S1 and S2 present, no murmurs, rubs, gallops.  Distal pulses intact Respiratory: No respiratory distress, no accessory muscle use.  Effort is  normal.  Lungs are clear to auscultation bilaterally. GI: Nondistended, soft, nontender to palpation, normal active bowel sounds Musculoskeletal: frail, LLE shortened, overlying contusion of the left thigh, no hematoma, able to move L toes  Neurological: Is alert and oriented x4, no apparent focal deficits noted. Skin: Warm and dry.  Old healing area of ecchymosis of the bilateral lower extremities. Psychiatric: anxious appearing, mood labile  EKG: personally reviewed my interpretation is sinus rhythm significant artifact likely due to the patient movement, no obvious ischemic changes  DG FEMUR PORT 1V LEFT  Result Date: 02/18/2023 CLINICAL DATA:  Postreduction. EXAM: LEFT FEMUR PORTABLE 1 VIEW COMPARISON:  Same day femur radiographs FINDINGS: Again seen is an oblique fracture of the left femoral diaphysis. There remains approximately 2.3 cm medial displacement and 1.2 cm proximal overlap of the distal fragment. A lateral image was not provided. Femoroacetabular alignment remains normal. The left SI joint and symphysis pubis are intact. IMPRESSION: Persistent displaced left femoral diaphyseal fracture as above. A lateral image was not provided on the current study. Electronically Signed   By: Lesia Hausen M.D.   On: 02/18/2023 10:38   DG Pelvis Portable  Result Date: 02/18/2023 CLINICAL DATA:  67 year old female level 2 trauma status post fall. Left femur deformity. EXAM: PORTABLE PELVIS 1-2 VIEWS COMPARISON:  Left femur series today. CT Abdomen and Pelvis 07/13/2022. FINDINGS: Portable AP supine view at 1001 hours. Femoral heads remain normally located. Pelvis appears stable and intact. SI joints within normal limits. Grossly intact proximal right femur. Partially visible lumbar spine degeneration, mild dextroconvex scoliosis. Negative visible bowel gas. Proximal 3rd left femur shaft fracture deformity detailed separately. IMPRESSION: 1. No acute fracture or dislocation identified about the pelvis.  2. Left femur shaft fracture detailed separately. Electronically Signed   By: Odessa Fleming M.D.   On: 02/18/2023 10:28   DG FEMUR PORT 1V LEFT  Result Date: 02/18/2023 CLINICAL DATA:  67 year old female level 2 trauma status post fall. Left femur deformity. EXAM: LEFT FEMUR PORTABLE 1 VIEW COMPARISON:  None Available. FINDINGS: Three potable views at 0956 hours. Oblique and mildly comminuted fracture of the proximal left femur shaft. One full shaft width medial displacement and over-riding up to 3 cm on the portable AP view. On the lateral there is 1 full shaft width anterior displacement and nearly 45 degree anterior angulation of the distal fragment, along with overriding of about 3 cm. Both the left hip and knee appear to remain normally aligned. Grossly intact visible pelvis. IMPRESSION: Oblique fracture of the proximal left femur shaft with severe anterior angulation, 1 full shaft width medial and anterior displacement, overriding by 3 cm, mild comminution. Electronically Signed   By: Odessa Fleming M.D.   On: 02/18/2023 10:27   DG Chest Port 1 View  Result Date: 02/18/2023 CLINICAL DATA:  67 year old female level 2 trauma status post fall.  Left femur deformity. EXAM: PORTABLE CHEST 1 VIEW COMPARISON:  Chest CT 12/11/2020. FINDINGS: Portable AP supine view at 0959 hours. Mildly rotated to the right. Lung volumes and mediastinal contours are within normal limits. Visualized tracheal air column is within normal limits. Allowing for portable technique the lungs are clear. No pneumothorax or pleural effusion. No acute osseous abnormality identified. Paucity of bowel gas in the visible abdomen. IMPRESSION: No acute cardiopulmonary abnormality or acute traumatic injury identified. Electronically Signed   By: Odessa Fleming M.D.   On: 02/18/2023 10:09     Assessment & Plan by Problem: Principal Problem:   Closed left femoral fracture  Displaced left femoral fracture Mechanical fall this morning while outside in the  garden.  She does have a history of osteoporosis on Fosamax, vitamin D, and calcium.  Imaging shows left femoral diaphyseal fracture that is displaced.  Patient placed in traction.  Plan for intramedullary nailing later this evening with orthopedic surgery.  Patient with significant pain with fracture receiving ketamine and Dilaudid in the ED.  Patient awake with stable vitals after this. -Orthopedics consulted, plan for IMN later today -Pain control with scheduled Tylenol, oxycodone 5 mg every 4 as needed, ketorolac 15 mg Q6 as needed Dilaudid 1 mg for as needed for breakthrough pain -continue robaxin -Continue traction per orho -PT and OT surgical management -Will need to discuss gait instability more after surgery and pain is more controlled, is on multiple centrally acting medications that may be contributing to this  Osteoporosis History of L5 compression fracture. DXA in 2023 with T-score of -2.8 at the left femoral neck and -3.8 at the forearm.  On alendronate, calcium and vitamin D. -Continue calcium carbonate and cholecalciferol  -Check 25 vitamin D  Chronic migraines Not currently having a headache.   -Continue home amitriptyline and topiramate  Hypertension Does not appear she is on medications at home.  BP mostly normotensive here.  -Monitor BP  Insomnia -Continue home zolpidem  Jehovah's Witness Hgb stable. No indication for transfusion currently. Will need to discuss with patient if the need arises.  Dispo: Admit patient to Inpatient with expected length of stay greater than 2 midnights.  Signed: Quincy Simmonds, MD 02/18/2023, 2:06 PM  Pager: [phone] After 5pm on weekdays and 1pm on weekends: On Call pager: [phone]

## 2023-02-18 NOTE — Consult Note (Signed)
Orthopedic Surgery Consult Note  Assessment: Patient is a 67 y.o. female with closed left femoral shaft fracture   Plan: -Operative plans: planning for OR tonight -Diet: NPO for procedure -DVT ppx: aspirin  BID -Antibiotics: ancef on call to OR -TXA on call to OR -Weight bearing status: NWB LLE -PT evaluate and treat post-operatively -Pain control -Dispo: pending completion of operative plans   Discussed recommendation for operative intervention in the form of left femur intramedullary rodding for a left femur fracture. Explained the risks of this procedure included, but were not limited to: nonunion, malunion, hardware failure, infection, bleeding, stiffness, leg length discrepancy, malrotation, need for additional procedures, deep vein thrombosis, pulmonary embolism, and death. The benefits of this procedure would be to promote fracture healing by providing stability and to heal the fracture in the appropriate alignment. It would also allow for early mobilization. The alternatives of this surgery would be to treat the fracture were presented as traction, plating, or no intervention. The patient's questions were answered to her satisfaction. After this discussion, patient elected to proceed with surgery. Informed consent was obtained.   ___________________________________________________________________________   Chief complaint: left thigh pain  History:  Patient is a 67 y.o. female who was refilling her bird feeder outside when she fell. She landed on her left side. She noted acute onset of left thigh pain. She was unable to ambulate and called EMS. She was brought to [facility] ED where she was diagnosed with a left femur fracture. She was admitted to the medicine service. She currently has severe (10/10) pain in her left thigh. No pain elsewhere. Denies paresthesias and numbness.   Of note, does not want blood transfusion of any kind  Review of systems: General: denies  fevers and chills, myalgias Neurologic: denies recent changes in vision, slurred speech Abdomen: denies nausea, vomiting, hematemesis Respiratory: denies cough, shortness of breath  Past medical history:  Osteoporosis Melanoma s/p excision HLD Migraines L5 compression fracture  Allergies: morphine, tape  Past surgical history:  Melanoma excision Hysterectomy Temporal artery biopsy Cholecystectomy Cataract surgery   Social history: Denies use of nicotine-containing products (cigarettes, vaping, smokeless, etc.) Alcohol use: denies Denies use of recreational drugs  Family history: -reviewed and not pertinent to femoral shaft fracture   Physical Exam:  General: no acute distress, appears stated age Neurologic: alert, answering questions appropriately, following commands Cardiovascular: regular rate, no cyanosis Respiratory: unlabored breathing on room air, symmetric chest rise Psychiatric: appropriate affect, normal cadence to speech  MSK:   -Bilateral upper extremities  No tenderness to palpation over extremity, no gross deformity, no breaks in the skin Fires deltoid, biceps, triceps, wrist extensors, wrist flexors, finger extensors, finger flexors  AIN/PIN/IO intact  Palpable radial pulse  Sensation intact to light touch in median/ulnar/radial/axillary nerve distributions  Hand warm and well perfused  -Bilateral lower extremities  No tenderness to palpation over extremity, no gross deformity, no breaks in the skin Fires hip flexors, quadriceps, hamstrings, tibialis anterior, gastrocnemius and soleus, extensor hallucis longus Plantarflexes and dorsiflexes toes Sensation intact to light touch in sural, saphenous, tibial, deep peroneal, and superficial peroneal nerve distributions Foot warm and well perfused, palpable DP pulse  -Left  Left shortened when compared to contralateral side, swelling over the middle of the thigh, no open wounds seen, TTP over the thigh.  No TTP over the knee, leg, ankle, foot Fires hip quadriceps, hamstrings, tibialis anterior, gastrocnemius and soleus, extensor hallucis longus  Does not fire hip flexors due to pain Plantarflexes and  dorsiflexes toes Sensation intact to light touch in sural, saphenous, tibial, deep peroneal, and superficial peroneal nerve distributions Foot warm and well perfused, palpable DP pulse  Imaging: XR of the left femur from 02/18/2023 was independently reviewed and interpreted, showing a displaced, shortened spiral fracture of the proximal third of the femur. No femoral neck fracture seen.   CT of the left hip from 02/18/2023 did not show a femoral neck fracture  Patient name: Teresa [name] Patient MRN: [id number] Date: 02/18/23

## 2023-02-18 NOTE — Anesthesia Procedure Notes (Signed)
Procedure Name: Intubation Date/Time: 02/18/2023 5:58 PM  Performed by: Wendi Snipes, CRNAPre-anesthesia Checklist: Patient identified, Emergency Drugs available, Suction available and Patient being monitored Patient Re-evaluated:Patient Re-evaluated prior to induction Oxygen Delivery Method: Circle System Utilized Preoxygenation: Pre-oxygenation with 100% oxygen Induction Type: IV induction Ventilation: Mask ventilation without difficulty Laryngoscope Size: Mac and 3 Grade View: Grade I Tube type: Oral Tube size: 7.0 mm Number of attempts: 1 Airway Equipment and Method: Stylet and Oral airway Placement Confirmation: ETT inserted through vocal cords under direct vision, positive ETCO2 and breath sounds checked- equal and bilateral Secured at: 22 cm Tube secured with: Tape Dental Injury: Teeth and Oropharynx as per pre-operative assessment

## 2023-02-18 NOTE — Op Note (Addendum)
Orthopedic Surgery Operative Report   Procedure: Left femoral shaft fracture intramedullary rodding   Modifier: none   Date of procedure: 02/18/2023   Patient name: Teresa [name]   MRN: [id number]  DOB: [birth year]   Surgeon: Willia Craze, MD Assistant: None Pre-operative diagnosis: left femoral shaft fracture Post-operative diagnosis: same as above Findings: left proximal third femoral shaft fracture   Specimens: none Anesthesia: general EBL: 200cc Complications: none Pre-incision antibiotic: ancef   Implants:  Implant Name Type Inv. Item Serial No. Manufacturer Lot No. LRB No. Used Action  11.5 x 40 cm TROCHANTERIC ANTEGRADE NAIL Nail   [employer] [id number] Left 1 Implanted  SCREW COMP LAG 90X85 - [id number] Screw SCREW COMP LAG 90X85  [employer] [id number] Left 1 Implanted  SCREW TRIGEN LOW PROF 5.0X35 - [id number] Screw SCREW TRIGEN LOW PROF 5.0X35  [employer] [id number] Left 1 Implanted  SCREW TRIGEN LOW PROF 5.0X37.5 - [id number] Screw SCREW TRIGEN LOW PROF 5.0X37.5  [employer] [id number] Left 1 Implanted  SCREW TRIGEN LOW PROF 5.0X37.5 - [id number] Screw SCREW TRIGEN LOW PROF 5.0X37.5  [employer] [id number] Left 1 Implanted       Indication for procedure: Patient is a 67 y.o. female who presented to the ER after a fall while trying to fill her bird feeder. The patient had left thigh pain and was unable to ambulate. EMS was called and she was brought to [facility] Emergency Department. X-rays revealed an left femur fracture in the emergency department. A CT scan was obtained and did not show an associated femoral neck fracture. The patient was admitted to a medicine service with orthopedics consulted. I met the patient and discussed the fracture. I recommended operative management in the form of intramedullary rodding to stabilize the fracture and allow for mobilization. I discussed placing  screws into her femoral neck because she said she has had osteoporosis for several years and her femur fracture was the result of a low energy mechanism. Explained the risks of this procedure included, but were not limited to: nonunion, malunion, fixation failure, infection, bleeding, stiffness, need for additional procedures, malrotation, leg length discrepancy, deep vein thrombosis, pulmonary embolism, MI, arrhythmia, and death. The alternatives of this surgery would be to treat the fracture were presented as traction, plating, or no intervention. The patient's questions were answered to her satisfaction. After this discussion, patient elected to proceed with surgery. Informed consent was obtained .    Procedure Description: The patient was met in the pre-operative holding area. The patient's identity and consent were verified. The operative site was marked by myself. The patient's remaining questions about the surgery were answered. The patient was brought back to the operating room. General anesthesia was induced and an endotracheal tube was placed by the anesthesia staff. The patient was transferred to the [facility] table. All bony prominences were well padded. Traction was applied and an attempt at closed reduction with the Hana table was made. Fluoroscopy confirmed a satisfactory length and reduction on the AP view. There was apex posterior angulation on the lateral that was reducible with manual pressure at the apex. An AP view of the femoral neck was obtained that did not show any femoral neck fracture. The surgical area was cleansed with alcohol. Ancef and TXA were administered by anesthesia. The patient's skin was then prepped and draped in a standard, sterile fashion. A time out was performed that identified the patient, the procedure, and the laterality.  All team members agreed with what was stated in the time out.    An incision was made just proximal and inferior to the greater trochanter. The incision  was taken sharply down through the fascia. A guide pin was inserted into the wound onto the top of the greater trochanter. Fluoroscopy was used to place the guide pin at the starting point at the tip of the greater trochanter and in line with the middle of the femoral neck. The wire was then advanced to a point just past the lesser trochanter. A soft tissue sleeve was advanced over the wire onto the greater trochanter. An entry reamer was used to open the proximal femoral canal under fluoroscopic guidance. The pin and reamer were removed. A long guide wire was placed down the femoral canal. It was advanced to the superior aspect of the patella under fluoroscopy. The length of the nail was estimated off of the guide wire. The nail measure about 42 so a 40 was selected. The scrub applied manual pressure to reduce the apex posterior angulation. AP and lateral fluoroscopy confirmed satisfactory reduction. A 9mm reamer was inserted over the guidewire and used to ream the femoral canal. It was advanced down past the isthmus under fluoroscopic guidance. The canal was serially reamed with increasing sized reamers until a 12.42mm reamer at which point there was chatter. A 11.12mm nail was selected. The nail was assembled on the back table and then advanced over the guidewire. It was advanced until the lag screws were estimated to end in the center of the femoral head. The guide wire was removed. AP and lateral fluoroscopy at the fracture showed satisfactory reduction in both planes.    An incision was made sharply through the skin, dermis, and fascia over the lateral thigh in the area where the lag screws would be inserted. The lag screw inserter was placed through the jig onto the lateral femoral cortex. A guide wire was advanced through the lag screw inserted into the femoral head under fluoroscopic guidance. It was found to be in acceptable position on the AP and lateral views. The length of the lag screw was estimated  off of the guide wire. A 90mm screw was selected. The inferior lag screw was drilled through the guide. The derotation device was placed through the inserter. The proximal lag screw hole was then drilled over the guide wire. The screw was inserted over the wire under fluoroscopic guidance. The derotation bar was removed and the inferior lag (85mm) screw was inserted.    The C arm was then brought to the knee in a lateral position to obtain perfect circles at the distal interlocking holes. An incision was made over the distal interlocking holes on the lateral aspect of the femur. This incision was taken down through fascia. A drill was inserted into the wound and placed over the interlocking hole using perfect circle technique. The hole was then drilled bicortically. A depth gauge was used to estimate the screw length. A 35mm screw was selected for the more proximal hole. This was inserted and there was good purchase. The same process was then repeated to insert a 37.9mm screw in the more distal hole. The C arm was then reposition to get a perfect circle of the distal, middle oblique interlocking hole. The drill was placed into the previously made distal incision and was used to drill bicortically through the interlocking hole. A depth gauge was used to estimate the screw length and a 37.75mm screw was  inserted into the hole.    Final AP and lateral fluoroscopic images were then taken of the fracture, hip, and knee showing satisfactory reduction and placement of the intramedullary rod. The wounds were copiously irrigated with sterile saline. 1g of TXA was again administered as closure was taking place. The fascia was closed with 0 vicryl. The deep dermal layer was closed with 2-0 vicryl. The skin was closed with staples. Dressings were applied. All counts were correct at the end of the case. Patient was transferred back to a hospital bed. Her knee was examined - negative lachman, negative posterior drawer, no  instability to varus or valgus stress. Her length, rotation, and alignment were similar to the contralateral side. The patient was awakened from anesthesia and brought back to the post-anesthesia care unit in stable condition.     Post-operative plan: The patient will recover in the post-anesthesia care unit and then go to the floor on the medicine service. The patient will receive two post-operative doses of ancef. The patient will be weight bearing as tolerated. The patient will work with physical therapy. The patient's disposition will be determined by the medicine service.       Willia Craze, MD Orthopedic Surgeon

## 2023-02-18 NOTE — ED Triage Notes (Signed)
Pt coming from home, pt fell while feeding the birds. She fell onto a rock driveway. Obvious deformity to left femur

## 2023-02-18 NOTE — Anesthesia Preprocedure Evaluation (Addendum)
Anesthesia Evaluation  Patient identified by MRN, date of birth, ID band Patient awake    Reviewed: Allergy & Precautions, NPO status , Patient's Chart, lab work & pertinent test results  Airway Mallampati: II  TM Distance: >3 FB Neck ROM: Full    Dental  (+) Teeth Intact, Dental Advisory Given   Pulmonary neg pulmonary ROS   Pulmonary exam normal breath sounds clear to auscultation       Cardiovascular hypertension, Normal cardiovascular exam Rhythm:Regular Rate:Normal     Neuro/Psych  Headaches PSYCHIATRIC DISORDERS  Depression     Neuromuscular disease    GI/Hepatic Neg liver ROS,GERD  Medicated,,  Endo/Other  negative endocrine ROS    Renal/GU negative Renal ROS     Musculoskeletal  (+) Arthritis ,  Fibromyalgia - left femur fracture   Abdominal   Peds  Hematology negative hematology ROS (+)   Anesthesia Other Findings Day of surgery medications reviewed with the patient.  Reproductive/Obstetrics                             Anesthesia Physical Anesthesia Plan  ASA: 2  Anesthesia Plan: General   Post-op Pain Management: Toradol IV (intra-op)* and Ofirmev IV (intra-op)*   Induction: Intravenous  PONV Risk Score and Plan: 3 and Midazolam, Dexamethasone and Ondansetron  Airway Management Planned: Oral ETT  Additional Equipment:   Intra-op Plan:   Post-operative Plan: Extubation in OR  Informed Consent: I have reviewed the patients History and Physical, chart, labs and discussed the procedure including the risks, benefits and alternatives for the proposed anesthesia with the patient or authorized representative who has indicated his/her understanding and acceptance.   Patient has DNR.  Discussed DNR with patient and Suspend DNR.   Dental advisory given  Plan Discussed with: CRNA  Anesthesia Plan Comments:         Anesthesia Quick Evaluation

## 2023-02-18 NOTE — ED Provider Notes (Signed)
[place] EMERGENCY DEPARTMENT AT [facility] Provider Note   CSN: [id number] Arrival date & time: 02/18/23  4098     History  Chief Complaint  Patient presents with   Teresa [name] is a 67 y.o. female Jehovah's Witness with h/o osteopenia, HTN, IDA, chronic pain syndrome, hypokalemia, insomnia and depression who presents with fall, thigh deformity. Level 2 trauma.  Patient presents BIBEMS from  home after fall at home in the garden with extreme pain to left thigh with obvious deformity. Patient is in extreme pain and history is limited.  Patient states she did not hit her head or lose consciousness in the fall.  Complains of pain nowhere else. Patient was given a total of 175 mcg of fentanyl IV by EMS over the course of ~45 min.  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  HPI     Home Medications Prior to Admission medications   Medication Sig Start Date End Date Taking? Authorizing Provider  acetaminophen (TYLENOL) 500 MG tablet Take 500-1,000 mg by mouth every 6 (six) hours as needed (pain.).   Yes [provider]  alendronate (FOSAMAX) 70 MG tablet Take 1 tablet (70 mg total) by mouth every Wednesday. Take with a full glass of water on an empty stomach. 06/05/21  Yes Sakai, Isami, DO  Ascorbic Acid (VITAMIN C) 1000 MG tablet Take 1,000 mg by mouth daily before lunch.   Yes [provider]  aspirin-acetaminophen-caffeine (EXCEDRIN MIGRAINE) [phone] MG tablet Take 1 tablet by mouth 3 (three) times daily as needed for headache.   Yes [provider]  Calcium Carb-Cholecalciferol (CALTRATE 600+D3 PO) Take 1 tablet by mouth daily with breakfast.   Yes [provider]  Cholecalciferol (VITAMIN D3) 125 MCG (5000 UT) TABS Take 5,000 Units by mouth daily with breakfast.   Yes [provider]  dicyclomine (BENTYL) 20 MG tablet Take 20 mg by mouth 4 (four) times daily as needed  (bloating/indigestion.). 05/23/21  Yes [provider]  ferrous gluconate (FERGON) 324 MG tablet Take 648 mg by mouth daily before lunch.   Yes [provider]  gabapentin (NEURONTIN) 300 MG capsule Take 600 mg by mouth 2 (two) times daily. With breakfast & with lunch 05/17/21  Yes [provider]  Inulin (FIBER CHOICE PO) Take 1 tablet by mouth at bedtime. Health Plus Super Colon Cleanse   Yes [provider]  nortriptyline (PAMELOR) 10 MG capsule Take 50 mg by mouth daily with lunch. 05/08/21  Yes [provider]  pantoprazole (PROTONIX) 40 MG tablet Take by mouth. 06/17/21 02/18/23 Yes [provider]  topiramate (TOPAMAX) 100 MG tablet Take 100 mg by mouth at bedtime. 01/26/20  Yes [provider]  zolpidem (AMBIEN CR) 12.5 MG CR tablet Take 1 tablet by mouth at bedtime as needed. 06/27/22  Yes [provider]  [employer] VITAMIN B12 1000 MCG tablet Take 1,000 mcg by mouth every other day. In the morning Patient not taking: Reported on 02/18/2023 04/10/21   [provider]  tiZANidine (ZANAFLEX) 2 MG tablet Take 1 tablet (2 mg total) by mouth every 6 (six) hours as needed for muscle spasms. Patient not taking: Reported on 02/18/2023 07/24/22   Edward Jolly, MD      Allergies    Elastic bandages & [zinc] and Morphine    Review of Systems   Review of Systems  Unable to perform ROS: Other (Severe pain)  Physical Exam Updated Vital Signs BP 132/67   Pulse 96   Temp 97.9 F (36.6 C)   Resp 19   Ht  (1.676 m)   Wt 59 kg   SpO2 100%   BMI 20.98 kg/m  Physical Exam  PRIMARY SURVEY  Airway Airway intact  Breathing Bilateral breath sounds  Circulation Carotid/femoral pulses 2+ intact bilaterally  GCS E =  4 V =  5 M =  6 Total = 15  Environment All clothes removed      SECONDARY SURVEY  Gen: -Severe distress  HEENT: -Head: NCAT. Scalp is clear of lacerations or wounds. Skull is clear of deformities or  depressions -Forehead: Normal -Midface: Stable -Eyes: No visible injury to eyelids or eye, PERRL, EOMI -Nose: No gross deformities, no septal hematoma -Mouth: No injuries to lips, tongue or teeth. No trismus or malposition -Ears: No auricular hematoma -Neck: Trachea is midline, no distended neck veins  Chest: -No tenderness, deformities, bruising or crepitus to clavicles or chest -Normal chest expansion -Normal heart sounds, S1/S2 normal, no m/r/g -No wheezes, rales, rhonchi  Abdomen: -No tenderness, bruising or penetrating injury  Pelvis: -Pelvis is stable and non-tender  Genital:  -No gross deformity or visible injury to perineum or external genitalia -No blood at urethral meatus -No incontinence  Extremities: Right Upper Extremity: -No point tenderness, deformity or other signs of injury -Radial pulse intact RUE, cap refill good -Normal sensation -Normal ROM, good strength Left Upper Extremity: -No point tenderness, deformity or other signs of injury -Radial pulse intact LUE, cap refill good -Normal sensation -Normal ROM, good strength Right Lower Extremity: -No point tenderness, deformity or other signs of injury -DP intact RLE -Normal sensation -Normal ROM, good strength Left Lower Extremity: -Gross mid-femur deformity, no open wounds, compartment with swelling but soft overall.  -Intact distal pulses and sensation -Able to wiggle toes of L foot  Back/Spine: -No midline C-spine tenderness or step-offs  Other: N/A     ED Results / Procedures / Treatments   Labs (all labs ordered are listed, but only abnormal results are displayed) Labs Reviewed  COMPREHENSIVE METABOLIC PANEL - Abnormal; Notable for the following components:      Result Value   CO2 19 (*)    Glucose, Bld 157 (*)    Total Protein 6.4 (*)    Alkaline Phosphatase 34 (*)    Total Bilirubin 0.2 (*)    All other components within normal limits  CBC - Abnormal; Notable for the following components:    WBC 17.8 (*)    All other components within normal limits  I-STAT CHEM 8, ED - Abnormal; Notable for the following components:   Glucose, Bld 154 (*)    Calcium, Ion 1.11 (*)    TCO2 19 (*)    All other components within normal limits  ETHANOL  PROTIME-INR  URINALYSIS, ROUTINE W REFLEX MICROSCOPIC  LACTIC ACID, PLASMA  VITAMIN D 25 HYDROXY (VIT D DEFICIENCY, FRACTURES)  SAMPLE TO BLOOD BANK  TYPE AND SCREEN    EKG None  Radiology DG FEMUR PORT 1V LEFT  Result Date: 02/18/2023 CLINICAL DATA:  Postreduction. EXAM: LEFT FEMUR PORTABLE 1 VIEW COMPARISON:  Same day femur radiographs FINDINGS: Again seen is an oblique fracture of the left femoral diaphysis. There remains approximately 2.3 cm medial displacement and 1.2 cm proximal overlap of the distal fragment. A lateral image was not provided. Femoroacetabular alignment remains normal. The left SI joint and symphysis pubis are intact. IMPRESSION: Persistent displaced  left femoral diaphyseal fracture as above. A lateral image was not provided on the current study. Electronically Signed   By: Lesia Hausen M.D.   On: 02/18/2023 10:38   DG Pelvis Portable  Result Date: 02/18/2023 CLINICAL DATA:  67 year old female level 2 trauma status post fall. Left femur deformity. EXAM: PORTABLE PELVIS 1-2 VIEWS COMPARISON:  Left femur series today. CT Abdomen and Pelvis 07/13/2022. FINDINGS: Portable AP supine view at 1001 hours. Femoral heads remain normally located. Pelvis appears stable and intact. SI joints within normal limits. Grossly intact proximal right femur. Partially visible lumbar spine degeneration, mild dextroconvex scoliosis. Negative visible bowel gas. Proximal 3rd left femur shaft fracture deformity detailed separately. IMPRESSION: 1. No acute fracture or dislocation identified about the pelvis. 2. Left femur shaft fracture detailed separately. Electronically Signed   By: Odessa Fleming M.D.   On: 02/18/2023 10:28   DG FEMUR PORT 1V LEFT  Result  Date: 02/18/2023 CLINICAL DATA:  67 year old female level 2 trauma status post fall. Left femur deformity. EXAM: LEFT FEMUR PORTABLE 1 VIEW COMPARISON:  None Available. FINDINGS: Three potable views at 0956 hours. Oblique and mildly comminuted fracture of the proximal left femur shaft. One full shaft width medial displacement and over-riding up to 3 cm on the portable AP view. On the lateral there is 1 full shaft width anterior displacement and nearly 45 degree anterior angulation of the distal fragment, along with overriding of about 3 cm. Both the left hip and knee appear to remain normally aligned. Grossly intact visible pelvis. IMPRESSION: Oblique fracture of the proximal left femur shaft with severe anterior angulation, 1 full shaft width medial and anterior displacement, overriding by 3 cm, mild comminution. Electronically Signed   By: Odessa Fleming M.D.   On: 02/18/2023 10:27   DG Chest Port 1 View  Result Date: 02/18/2023 CLINICAL DATA:  67 year old female level 2 trauma status post fall. Left femur deformity. EXAM: PORTABLE CHEST 1 VIEW COMPARISON:  Chest CT 12/11/2020. FINDINGS: Portable AP supine view at 0959 hours. Mildly rotated to the right. Lung volumes and mediastinal contours are within normal limits. Visualized tracheal air column is within normal limits. Allowing for portable technique the lungs are clear. No pneumothorax or pleural effusion. No acute osseous abnormality identified. Paucity of bowel gas in the visible abdomen. IMPRESSION: No acute cardiopulmonary abnormality or acute traumatic injury identified. Electronically Signed   By: Odessa Fleming M.D.   On: 02/18/2023 10:09    Procedures .Sedation  Date/Time: 02/18/2023 3:17 PM  Performed by: Loetta Rough, MD Authorized by: Loetta Rough, MD   Consent:    Consent obtained:  Verbal   Consent given by:  Spouse   Risks discussed:  Allergic reaction, dysrhythmia, inadequate sedation, nausea, vomiting, respiratory compromise  necessitating ventilatory assistance and intubation, prolonged sedation necessitating reversal and prolonged hypoxia resulting in organ damage   Alternatives discussed:  Analgesia without sedation Universal protocol:    Procedure explained and questions answered to patient or proxy's satisfaction: yes     Test results available: yes     Imaging studies available: yes     Required blood products, implants, devices, and special equipment available: yes     Immediately prior to procedure, a time out was called: yes     Patient identity confirmed:  Verbally with patient Indications:    Procedure performed:  Fracture reduction   Procedure necessitating sedation performed by:  Different physician Pre-sedation assessment:    Time since last food or drink:  Unknown   ASA classification: class 2 - patient with mild systemic disease     Mallampati score:  II - soft palate, uvula, fauces visible   Neck mobility: normal     Pre-sedation assessments completed and reviewed: pain level     Pre-sedation assessments completed and reviewed: pre-procedure airway patency not reviewed, pre-procedure cardiovascular function not reviewed, pre-procedure hydration status not reviewed, pre-procedure mental status not reviewed, pre-procedure nausea and vomiting status not reviewed, pre-procedure respiratory function not reviewed and pre-procedure temperature not reviewed   Immediate pre-procedure details:    Reassessment: Patient reassessed immediately prior to procedure     Reviewed: vital signs and relevant labs/tests     Verified: bag valve mask available, emergency equipment available, intubation equipment available, IV patency confirmed and oxygen available   Procedure details (see MAR for exact dosages):    Preoxygenation:  Nasal cannula   Sedation:  Ketamine   Intended level of sedation: moderate (conscious sedation)   Analgesia:  Fentanyl   Intra-procedure monitoring:  Blood pressure monitoring, cardiac  monitor, continuous pulse oximetry, frequent LOC assessments and frequent vital sign checks   Intra-procedure events: none     Total Provider sedation time (minutes):  15 Post-procedure details:    Attendance: Constant attendance by certified staff until patient recovered     Recovery: Patient returned to pre-procedure baseline     Post-sedation assessments completed and reviewed: post-procedure airway patency not reviewed, post-procedure cardiovascular function not reviewed, post-procedure hydration status not reviewed, post-procedure mental status not reviewed, post-procedure nausea and vomiting status not reviewed, pain score not reviewed, post-procedure respiratory function not reviewed and post-procedure temperature not reviewed     Patient is stable for discharge or admission: yes     Procedure completion:  Tolerated well, no immediate complications     Medications Ordered in ED Medications  ondansetron (ZOFRAN) 4 MG/2ML injection (has no administration in time range)  fentaNYL (SUBLIMAZE) 50 MCG/ML injection (has no administration in time range)  ketamine 50 mg in normal saline 5 mL (10 mg/mL) syringe (20 mg Intravenous Given 02/18/23 0949)  ketamine 50 mg in normal saline 5 mL (10 mg/mL) syringe (60 mg Intravenous Given 02/18/23 1009)  ondansetron (ZOFRAN) injection (4 mg Intravenous Given 02/18/23 0955)  fentaNYL (SUBLIMAZE) injection (25 mcg Intravenous Given 02/18/23 0940)  HYDROmorphone (DILAUDID) injection 0.5 mg (0.5 mg Intravenous Given 02/18/23 1127)  lactated ringers bolus 500 mL (0 mLs Intravenous Stopped 02/18/23 1511)  HYDROmorphone (DILAUDID) injection 1 mg (1 mg Intravenous Given 02/18/23 1236)    ED Course/ Medical Decision Making/ A&P                          Medical Decision Making Amount and/or Complexity of Data Reviewed Labs: ordered. Decision-making details documented in ED Course. Radiology: ordered. Decision-making details documented in ED Course. ECG/medicine  tests: ordered.  Risk Prescription drug management. Decision regarding hospitalization.    This patient presents to the ED for concern of fall w/ femur deformity, this involves an extensive number of treatment options, and is a complaint that carries with it a high risk of complications and morbidity.  I considered the following differential and admission for this acute, potentially life threatening condition.   MDM:    Patient presents in extreme pain with midshaft femur deformity, obviously a fractured femur.  It is a closed fracture with no open wounds demonstrated on exam.  Patient is in extreme pain on arrival even after fentanyl.  An additional 25 mics of fentanyl was given IV on arrival and decision made to move to pain dose ketamine.  0.3 mg/kg of ketamine IV is given with good response.  She clearly has a distracting injury but physical exam reveals no other injuries from her fall, she denies hitting her head or losing consciousness, do not believe she requires a head CT at this time.  Patient with no evidence of neurovascular injury in her left lower extremity at this time.  X-rays demonstrate oblique severely angulated and displaced fracture.  Chest x-ray and pelvic xrays without any Ortho APP Dale [place] is at bedside on patient arrival and plans for reduction and traction on the femoral fracture immediately made.  I called patient's husband and consented him for both the ketamine sedation as well as the reduction of the fracture by Orthopedic surgery.  Patient is given 4 mg IV Zofran proactively and then slow push 1 mg/kg of ketamine IV and a successful sedation and the femur fracture is pulled to traction.  Sedation without incident and she returned to her preprocedure baseline.  Patient's labs demonstrate leukocytosis and mild increase in her creatinine.  She is admitted to medicine and plan is to go to the OR at 5 PM today for repair of her femur fracture. Post-sedation  analgesia w/ dialudid IV.    Clinical Course as of 02/18/23 1511  Wed Feb 18, 2023  1037 DG FEMUR PORT 1V LEFT Oblique fracture of the proximal left femur shaft with severe anterior angulation, 1 full shaft width medial and anterior displacement, overriding by 3 cm, mild comminution.   [HN]  1038 DG Chest Port 1 View No acute cardiopulmonary abnormality or acute traumatic injury identified.   [HN]  1038 DG Pelvis Portable 1. No acute fracture or dislocation identified about the pelvis. 2. Left femur shaft fracture detailed separately.   [HN]  1139 WBC(!): 17.8 Likely from trauma [HN]  1140 Calcium Ionized(!): 1.11 BL 0.7-1 [HN]  1140 Hemoglobin: 12.0 [HN]  1140 Consulted for admission. Plan to go to OR at 5 pm today. [HN]  1219 Admitted to medicine  [HN]    Clinical Course User Index [HN] Loetta Rough, MD    Labs: I Ordered, and personally interpreted labs.  The pertinent results include:  those listed above  Imaging Studies ordered: I ordered imaging studies including XRs I independently visualized and interpreted imaging. I agree with the radiologist interpretation  Additional history obtained from husband via the phone, EMS.  Cardiac Monitoring: The patient was maintained on a cardiac monitor.  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    Transfusion of blood product refused for religious  reason      Medicines Meds ordered this encounter  Medications   ketamine 50 mg in normal saline 5 mL (10 mg/mL) syringe   ondansetron (ZOFRAN) 4 MG/2ML injection    Mikeal Hawthorne M: cabinet  override   fentaNYL (SUBLIMAZE) 50 MCG/ML injection    Mikeal Hawthorne M: cabinet override   DISCONTD: fentaNYL (SUBLIMAZE) 50 MCG/ML injection    Mikeal Hawthorne M: cabinet override   ketamine HCl 50 MG/5ML Ronnie Derby M: cabinet override   ketamine HCl 50 MG/5ML Mardene Sayer, Carla G: cabinet override   DISCONTD: ketamine 50 mg in normal saline 5 mL (10 mg/mL) syringe   ketamine 50 mg in normal saline 5 mL (10 mg/mL) syringe   ondansetron (ZOFRAN) injection   fentaNYL (SUBLIMAZE) injection   methocarbamol (ROBAXIN) 1,000 mg in dextrose 5 % 100 mL IVPB   HYDROmorphone (DILAUDID) injection 0.5 mg   lactated ringers bolus 500 mL   HYDROmorphone (DILAUDID) injection 1 mg   oxyCODONE (Oxy IR/ROXICODONE) immediate release tablet 5 mg   OR Linked Order Group    acetaminophen (TYLENOL) tablet 1,000 mg    acetaminophen (TYLENOL) suppository 650 mg   polyethylene glycol (MIRALAX / GLYCOLAX) packet 17 g   ketorolac (TORADOL) 15 MG/ML injection 15 mg   gabapentin (NEURONTIN) capsule 600 mg   nortriptyline (PAMELOR) capsule 50 mg   pantoprazole (PROTONIX) EC tablet 40 mg   topiramate (TOPAMAX) tablet 100 mg   zolpidem (AMBIEN) tablet 5 mg   HYDROmorphone (DILAUDID) injection 1 mg   calcium carbonate (OS-CAL - dosed in mg of elemental calcium) tablet 1,250 mg   cholecalciferol (VITAMIN D3) 25 MCG (1000 UNIT) tablet 1,000 Units    I have reviewed the patients home medicines and have made adjustments as needed  Problem List / ED Course: Problem List Items Addressed This Visit       Musculoskeletal and Integument   * (Principal) Closed left femoral fracture - Primary                This note was created using dictation software, which may contain spelling or grammatical  errors.    Loetta Rough, MD 02/18/23 1520

## 2023-02-18 NOTE — Consult Note (Signed)
Reason for Consult:Left femur fx Referring Physician: Vivi Barrack Time called: 1610 Time at bedside: [phone]   Teresa [name] is an 67 y.o. female.  HPI: Teresa [name] was feeding the birds and tripped and fell. She had immediate left thigh pain and a horrible deformity. She was brought to the ED where x-rays showed a left femur fx and orthopedic surgery was consulted. She was then sedated, reduced, and placed in traction.  Past Medical History:  Diagnosis Date   Dyspnea    Headache    migraines   History of fractured vertebra 1982   3x   Hyperlipidemia    Low bone density    Melanoma (HCC)    Resected from Right foot.    Transfusion of blood product refused for religious reason     Past Surgical History:  Procedure Laterality Date   ABDOMINAL HYSTERECTOMY     ARTERY BIOPSY Left 12/18/2015   Procedure: BIOPSY TEMPORAL ARTERY;  Surgeon: Geanie Logan, MD;  Location: [facility];  Service: ENT;  Laterality: Left;   CHOLECYSTECTOMY     2021   COLONOSCOPY  11/03/1998   EYE SURGERY Bilateral 11/03/2012   Cataracts   melanoma excisison Right    1978    Family History  Problem Relation Age of Onset   Heart attack Father    Dementia Mother     Social History:  reports that she has never smoked. She has never used smokeless tobacco. She reports that she does not drink alcohol and does not use drugs.  Allergies:  Allergies  Allergen Reactions   Elastic Bandages & [Zinc] Rash   Morphine Rash     Pt thinks she had rash after extended use    Medications: I have reviewed the patient's current medications.  No results found for this or any previous visit (from the past 48 hour(s)).  DG Pelvis Portable  Result Date: 02/18/2023 CLINICAL DATA:  67 year old female level 2 trauma status post fall. Left femur deformity. EXAM: PORTABLE PELVIS 1-2 VIEWS COMPARISON:  Left femur series today. CT Abdomen and Pelvis 07/13/2022. FINDINGS: Portable AP supine view at 1001 hours. Femoral  heads remain normally located. Pelvis appears stable and intact. SI joints within normal limits. Grossly intact proximal right femur. Partially visible lumbar spine degeneration, mild dextroconvex scoliosis. Negative visible bowel gas. Proximal 3rd left femur shaft fracture deformity detailed separately. IMPRESSION: 1. No acute fracture or dislocation identified about the pelvis. 2. Left femur shaft fracture detailed separately. Electronically Signed   By: Odessa Fleming M.D.   On: 02/18/2023 10:28   DG FEMUR PORT 1V LEFT  Result Date: 02/18/2023 CLINICAL DATA:  67 year old female level 2 trauma status post fall. Left femur deformity. EXAM: LEFT FEMUR PORTABLE 1 VIEW COMPARISON:  None Available. FINDINGS: Three potable views at 0956 hours. Oblique and mildly comminuted fracture of the proximal left femur shaft. One full shaft width medial displacement and over-riding up to 3 cm on the portable AP view. On the lateral there is 1 full shaft width anterior displacement and nearly 45 degree anterior angulation of the distal fragment, along with overriding of about 3 cm. Both the left hip and knee appear to remain normally aligned. Grossly intact visible pelvis. IMPRESSION: Oblique fracture of the proximal left femur shaft with severe anterior angulation, 1 full shaft width medial and anterior displacement, overriding by 3 cm, mild comminution. Electronically Signed   By: Odessa Fleming M.D.   On: 02/18/2023 10:27   DG Chest [facility]  Result Date: 02/18/2023 CLINICAL DATA:  67 year old female level 2 trauma status post fall. Left femur deformity. EXAM: PORTABLE CHEST 1 VIEW COMPARISON:  Chest CT 12/11/2020. FINDINGS: Portable AP supine view at 0959 hours. Mildly rotated to the right. Lung volumes and mediastinal contours are within normal limits. Visualized tracheal air column is within normal limits. Allowing for portable technique the lungs are clear. No pneumothorax or pleural effusion. No acute osseous abnormality  identified. Paucity of bowel gas in the visible abdomen. IMPRESSION: No acute cardiopulmonary abnormality or acute traumatic injury identified. Electronically Signed   By: Odessa Fleming M.D.   On: 02/18/2023 10:09    Review of Systems  Unable to perform ROS: Acuity of condition  Musculoskeletal:  Positive for arthralgias (Left thigh).   Blood pressure 124/66, pulse (!) 104, temperature (!) 97 F (36.1 C), temperature source Axillary, resp. rate 17, height  (1.676 m), weight 59 kg, SpO2 100 %. Physical Exam Constitutional:      General: She is not in acute distress.    Appearance: She is well-developed. She is not diaphoretic.  HENT:     Head: Normocephalic and atraumatic.  Eyes:     General: No scleral icterus.       Right eye: No discharge.        Left eye: No discharge.     Conjunctiva/sclera: Conjunctivae normal.  Cardiovascular:     Rate and Rhythm: Normal rate and regular rhythm.  Pulmonary:     Effort: Pulmonary effort is normal. No respiratory distress.  Musculoskeletal:     Cervical back: Normal range of motion.     Comments: LLE No traumatic wounds, ecchymosis, or rash  Severe TTP thigh, gross deformity same  No knee or ankle effusion  Knee stable to varus/ valgus and anterior/posterior stress  Sens DPN, SPN, TN intact  Motor EHL, ext, flex, evers 5/5  DP 1+, PT 2+, No significant edema  Skin:    General: Skin is warm and dry.  Neurological:     Mental Status: She is alert.  Psychiatric:        Mood and Affect: Mood normal.        Behavior: Behavior normal.     Assessment/Plan: Left femur fx -- Plan IMN today with Dr. Christell Constant. Please keep NPO.    Teresa Caldron, PA-C Orthopedic Surgery [phone] 02/18/2023, 10:32 AM

## 2023-02-18 NOTE — ED Notes (Signed)
ED TO INPATIENT HANDOFF REPORT  ED Nurse Name and Phone #:   S Name/Age/Gender Teresa [name] 67 y.o. female Room/Bed: 009C/009C  Code Status   Code Status: DNR  Home/SNF/Other Home Patient oriented to: self, place, time, and situation Is this baseline? Yes   Triage Complete: Triage complete  Chief Complaint Closed left femoral fracture [S72.92XA]  Triage Note Pt coming from home, pt fell while feeding the birds. She fell onto a rock driveway. Obvious deformity to left femur    Allergies Allergies  Allergen Reactions   Elastic Bandages & [Zinc] Rash   Morphine Rash     Pt thinks she had rash after extended use    Level of Care/Admitting Diagnosis ED Disposition     ED Disposition  Admit   Condition  --   Comment  Hospital Area: [facility] [[id number]]  Level of Care: Med-Surg [16]  May admit patient to [facility] or [facility] if equivalent level of care is available:: No  Covid Evaluation: Asymptomatic - no recent exposure (last 10 days) testing not required  Diagnosis: Closed left femoral fracture [696295]  Admitting Physician: Dickie La [[id number]]  Attending Physician: Dickie La [[id number]]  Certification:: I certify this patient will need inpatient services for at least 2 midnights  Estimated Length of Stay: 2          B Medical/Surgery History Past Medical History:  Diagnosis Date   Dyspnea    Headache    migraines   History of fractured vertebra 1982   3x   Hyperlipidemia    Low bone density    Melanoma (HCC)    Resected from Right foot.    Transfusion of blood product refused for religious reason    Past Surgical History:  Procedure Laterality Date   ABDOMINAL HYSTERECTOMY     ARTERY BIOPSY Left 12/18/2015   Procedure: BIOPSY TEMPORAL ARTERY;  Surgeon: Geanie Logan, MD;  Location: [facility];  Service: ENT;  Laterality: Left;   CHOLECYSTECTOMY     2021   COLONOSCOPY  11/03/1998   EYE SURGERY Bilateral  11/03/2012   Cataracts   melanoma excisison Right    1978     A IV Location/Drains/Wounds Patient Lines/Drains/Airways Status     Active Line/Drains/Airways     Name Placement date Placement time Site Days   Peripheral IV 02/18/23 Left Antecubital 02/18/23  0900  Antecubital  less than 1   Peripheral IV 02/18/23 20 G Anterior;Right Forearm 02/18/23  1120  Forearm  less than 1            Intake/Output Last 24 hours No intake or output data in the 24 hours ending 02/18/23 1608  Labs/Imaging Results for orders placed or performed during the hospital encounter of 02/18/23 (from the past 48 hour(s))  Ethanol     Status: None   Collection Time: 02/18/23 10:42 AM  Result Value Ref Range   Alcohol, Ethyl (B) <10 <10 mg/dL    Comment: (NOTE) Lowest detectable limit for serum alcohol is 10 mg/dL.  For medical purposes only. Performed at [facility], [address] [place], [place] [address]   Protime-INR     Status: None   Collection Time: 02/18/23 10:42 AM  Result Value Ref Range   Prothrombin Time 13.0 11.4 - 15.2 seconds   INR 1.0 0.8 - 1.2    Comment: (NOTE) INR goal varies based on device and disease states. Performed at [facility], [address], [place], [place] [address]   Comprehensive metabolic panel     Status: Abnormal   Collection Time: 02/18/23 10:47 AM  Result Value Ref Range   Sodium 137 135 - 145 mmol/L   Potassium 3.7 3.5 - 5.1 mmol/L   Chloride 105 98 - 111 mmol/L   CO2 19 (L) 22 - 32 mmol/L   Glucose, Bld 157 (H) 70 - 99 mg/dL    Comment: Glucose reference range applies only to samples taken after fasting for at least 8 hours.   BUN 17 8 - 23 mg/dL   Creatinine, Ser 6.04 0.44 - 1.00 mg/dL   Calcium 9.0 8.9 - 54.0 mg/dL   Total Protein 6.4 (L) 6.5 - 8.1 g/dL   Albumin 3.6 3.5 - 5.0 g/dL   AST 22 15 - 41 U/L   ALT 17 0 - 44 U/L   Alkaline Phosphatase 34 (L) 38 - 126 U/L   Total Bilirubin 0.2 (L) 0.3 - 1.2 mg/dL   GFR,  Estimated >98 >11 mL/min    Comment: (NOTE) Calculated using the CKD-EPI Creatinine Equation (2021)    Anion gap 13 5 - 15    Comment: Performed at [facility], [address], [place], [place] [address]  CBC     Status: Abnormal   Collection Time: 02/18/23 10:47 AM  Result Value Ref Range   WBC 17.8 (H) 4.0 - 10.5 K/uL   RBC 4.21 3.87 - 5.11 MIL/uL   Hemoglobin 12.0 12.0 - 15.0 g/dL   HCT 29.5 62.1 - 30.8 %   MCV 87.2 80.0 - 100.0 fL   MCH 28.5 26.0 - 34.0 pg   MCHC 32.7 30.0 - 36.0 g/dL   RDW 65.7 84.6 - 96.2 %   Platelets 370 150 - 400 K/uL   nRBC 0.0 0.0 - 0.2 %    Comment: Performed at [facility], [address], [place], [place] [address]  I-Stat Chem 8, ED     Status: Abnormal   Collection Time: 02/18/23 11:00 AM  Result Value Ref Range   Sodium 140 135 - 145 mmol/L   Potassium 3.8 3.5 - 5.1 mmol/L   Chloride 109 98 - 111 mmol/L   BUN 18 8 - 23 mg/dL   Creatinine, Ser 1.32 0.44 - 1.00 mg/dL   Glucose, Bld 440 (H) 70 - 99 mg/dL    Comment: Glucose reference range applies only to samples taken after fasting for at least 8 hours.   Calcium, Ion 1.11 (L) 1.15 - 1.40 mmol/L   TCO2 19 (L) 22 - 32 mmol/L   Hemoglobin 13.3 12.0 - 15.0 g/dL   HCT 10.2 72.5 - 36.6 %   DG FEMUR PORT 1V LEFT  Result Date: 02/18/2023 CLINICAL DATA:  Postreduction. EXAM: LEFT FEMUR PORTABLE 1 VIEW COMPARISON:  Same day femur radiographs FINDINGS: Again seen is an oblique fracture of the left femoral diaphysis. There remains approximately 2.3 cm medial displacement and 1.2 cm proximal overlap of the distal fragment. A lateral image was not provided. Femoroacetabular alignment remains normal. The left SI joint and symphysis pubis are intact. IMPRESSION: Persistent displaced left femoral diaphyseal fracture as above. A lateral image was not provided on the current study. Electronically Signed   By: Lesia Hausen M.D.   On: 02/18/2023 10:38   DG Pelvis Portable  Result Date:  02/18/2023 CLINICAL DATA:  67 year old female level 2 trauma status post fall. Left femur deformity. EXAM: PORTABLE PELVIS 1-2 VIEWS COMPARISON:  Left  femur series today. CT Abdomen and Pelvis 07/13/2022. FINDINGS: Portable AP supine view at 1001 hours. Femoral heads remain normally located. Pelvis appears stable and intact. SI joints within normal limits. Grossly intact proximal right femur. Partially visible lumbar spine degeneration, mild dextroconvex scoliosis. Negative visible bowel gas. Proximal 3rd left femur shaft fracture deformity detailed separately. IMPRESSION: 1. No acute fracture or dislocation identified about the pelvis. 2. Left femur shaft fracture detailed separately. Electronically Signed   By: Odessa Fleming M.D.   On: 02/18/2023 10:28   DG FEMUR PORT 1V LEFT  Result Date: 02/18/2023 CLINICAL DATA:  67 year old female level 2 trauma status post fall. Left femur deformity. EXAM: LEFT FEMUR PORTABLE 1 VIEW COMPARISON:  None Available. FINDINGS: Three potable views at 0956 hours. Oblique and mildly comminuted fracture of the proximal left femur shaft. One full shaft width medial displacement and over-riding up to 3 cm on the portable AP view. On the lateral there is 1 full shaft width anterior displacement and nearly 45 degree anterior angulation of the distal fragment, along with overriding of about 3 cm. Both the left hip and knee appear to remain normally aligned. Grossly intact visible pelvis. IMPRESSION: Oblique fracture of the proximal left femur shaft with severe anterior angulation, 1 full shaft width medial and anterior displacement, overriding by 3 cm, mild comminution. Electronically Signed   By: Odessa Fleming M.D.   On: 02/18/2023 10:27   DG Chest Port 1 View  Result Date: 02/18/2023 CLINICAL DATA:  67 year old female level 2 trauma status post fall. Left femur deformity. EXAM: PORTABLE CHEST 1 VIEW COMPARISON:  Chest CT 12/11/2020. FINDINGS: Portable AP supine view at 0959 hours. Mildly  rotated to the right. Lung volumes and mediastinal contours are within normal limits. Visualized tracheal air column is within normal limits. Allowing for portable technique the lungs are clear. No pneumothorax or pleural effusion. No acute osseous abnormality identified. Paucity of bowel gas in the visible abdomen. IMPRESSION: No acute cardiopulmonary abnormality or acute traumatic injury identified. Electronically Signed   By: Odessa Fleming M.D.   On: 02/18/2023 10:09    Pending Labs Unresulted Labs (From admission, onward)     Start     Ordered   02/19/23 0500  CBC  Tomorrow morning,   R        02/18/23 1236   02/19/23 0500  Basic metabolic panel  Tomorrow morning,   R        02/18/23 1236   02/18/23 1439  VITAMIN D 25 Hydroxy (Vit-D Deficiency, Fractures)  Once,   R        02/18/23 1438   02/18/23 0941  Urinalysis, Routine w reflex microscopic -Urine, Clean Catch  (Trauma Panel)  Once,   URGENT       Question:  Specimen Source  Answer:  Urine, Clean Catch   02/18/23 0941   02/18/23 0941  Lactic acid, plasma  (Trauma Panel)  Once,   STAT        02/18/23 0941   02/18/23 0941  Sample to Blood Bank  (Trauma Panel)  Once,   URGENT        02/18/23 0941   02/18/23 0941  Type and screen [facility]  ONCE - STAT,   STAT       Comments:  [facility]    02/18/23 0941            Vitals/Pain Today's Vitals   02/18/23 1404 02/18/23 1405 02/18/23 1410 02/18/23 1440  BP:  131/70 132/67   Pulse:  98 96   Resp:  16 19   Temp: 97.9 F (36.6 C)     TempSrc:      SpO2:   100%   Weight:      Height:      PainSc:    4     Isolation Precautions No active isolations  Medications Medications  ondansetron (ZOFRAN) 4 MG/2ML injection (has no administration in time range)  fentaNYL (SUBLIMAZE) 50 MCG/ML injection (has no administration in time range)  chlorhexidine (HIBICLENS) 4 % liquid 4 Application (has no administration in time range)  povidone-iodine 10 %  swab 2 Application (has no administration in time range)  ceFAZolin (ANCEF) IVPB 2g/100 mL premix (has no administration in time range)  tranexamic acid (CYKLOKAPRON) IVPB 1,000 mg (has no administration in time range)  methocarbamol (ROBAXIN) 1,000 mg in dextrose 5 % 100 mL IVPB (0 mg Intravenous Stopped 02/18/23 1300)  oxyCODONE (Oxy IR/ROXICODONE) immediate release tablet 5 mg (has no administration in time range)  acetaminophen (TYLENOL) tablet 1,000 mg (has no administration in time range)    Or  acetaminophen (TYLENOL) suppository 650 mg (has no administration in time range)  polyethylene glycol (MIRALAX / GLYCOLAX) packet 17 g (has no administration in time range)  ketorolac (TORADOL) 15 MG/ML injection 15 mg (15 mg Intravenous Given 02/18/23 1438)  gabapentin (NEURONTIN) capsule 600 mg (has no administration in time range)  nortriptyline (PAMELOR) capsule 50 mg (has no administration in time range)  pantoprazole (PROTONIX) EC tablet 40 mg (has no administration in time range)  topiramate (TOPAMAX) tablet 100 mg (has no administration in time range)  zolpidem (AMBIEN) tablet 5 mg (has no administration in time range)  HYDROmorphone (DILAUDID) injection 1 mg (has no administration in time range)  calcium carbonate (OS-CAL - dosed in mg of elemental calcium) tablet 1,250 mg (has no administration in time range)  cholecalciferol (VITAMIN D3) 25 MCG (1000 UNIT) tablet 1,000 Units (has no administration in time range)  chlorhexidine (PERIDEX) 0.12 % solution (has no administration in time range)  ketamine 50 mg in normal saline 5 mL (10 mg/mL) syringe (20 mg Intravenous Given 02/18/23 0949)  ketamine 50 mg in normal saline 5 mL (10 mg/mL) syringe (60 mg Intravenous Given 02/18/23 1009)  ondansetron (ZOFRAN) injection (4 mg Intravenous Given 02/18/23 0955)  fentaNYL (SUBLIMAZE) injection (25 mcg Intravenous Given 02/18/23 0940)  HYDROmorphone (DILAUDID) injection 0.5 mg (0.5 mg Intravenous Given  02/18/23 1127)  lactated ringers bolus 500 mL (0 mLs Intravenous Stopped 02/18/23 1511)  HYDROmorphone (DILAUDID) injection 1 mg (1 mg Intravenous Given 02/18/23 1236)    Mobility non-ambulatory     Focused Assessments    R Recommendations: See Admitting Provider Note  Report given to:   Additional Notes:

## 2023-02-18 NOTE — Progress Notes (Signed)
Orthopedic Surgery Progress Note   Assessment: Patient is a 67 y.o. female with left femur fracture status post intramedullary rodding   Plan: -Operative plans: complete -Diet: regular -DVT ppx: aspirin  BID -Antibiotics: ancef x2 post-op doses -Weight bearing status: as tolerated -PT evaluate and treat -Pain control -Dispo: to floor from PACU  ___________________________________________________________________________  Subjective: No acute events since surgery. Extubated uneventfully. Recovering in PACU. Pain currently rated at 6/10. Most of her pain is around the left most proximal incision. Denies paresthesias and numbness.    Physical Exam:  General: no acute distress, appears stated age Neurologic: alert, answering questions appropriately, following commands Respiratory: unlabored breathing on room air, symmetric chest rise Psychiatric: appropriate affect, normal cadence to speech  MSK:   -Left lower extremity  Small amount of blood on middle dressing, dressings otherwise c/d/i EHL/TA/GSC intact Plantarflexes and dorsiflexes toes Sensation intact to light touch in sural, saphenous, tibial, deep peroneal, and superficial peroneal nerve distributions Foot warm and well perfused, palpable DP pulse   Patient name: Teresa [name] Patient MRN: [id number] Date: 02/18/23

## 2023-02-18 NOTE — ED Notes (Signed)
Trauma Response Nurse Documentation   Teresa [name] is a 67 y.o. female arriving to [facility] ED via [facility]  On No antithrombotic. Trauma was activated as a Level 2 by Charge RN based on the following trauma criteria Stable femur, humerus, or pelvic fracture via any mechanism except GLF. Trauma team at the bedside on patient arrival.  No CTs ordered at this time.   GCS 15.  History   Past Medical History:  Diagnosis Date   Dyspnea    Headache    migraines   History of fractured vertebra 1982   3x   Hyperlipidemia    Low bone density    Melanoma (HCC)    Resected from Right foot.    Transfusion of blood product refused for religious reason      Past Surgical History:  Procedure Laterality Date   ABDOMINAL HYSTERECTOMY     ARTERY BIOPSY Left 12/18/2015   Procedure: BIOPSY TEMPORAL ARTERY;  Surgeon: Geanie Logan, MD;  Location: [facility];  Service: ENT;  Laterality: Left;   CHOLECYSTECTOMY     2021   COLONOSCOPY  11/03/1998   EYE SURGERY Bilateral 11/03/2012   Cataracts   melanoma excisison Right    1978       Initial Focused Assessment (If applicable, or please see trauma documentation): Airway  -- clear Breathing -- unlabored Circulation -- no bleeding noted, obviously deformed left femur- 1+ pedal palpable per ortho PA GCS - 15  CT's Completed:   none   Interventions:  Xrays Labs Pain control  Sedation for reduction Ortho consult   Plan for disposition:  OR   Consults completed:  Orthopaedic Surgeon at 1025. Charma Igo, PA- notified on pt's arrival) arrived in ED at 1030  Event Summary:  See EDP and primary RN notes  Before moving pt from EMS stretcher to ED stretcher, an order for pain medicine was obtained. Ortho PA was also notified on pt's arrival- Charma Igo was at the bedside with the EDP on arrival. Decision to initially medicate for pain first, - Fentanyl and Ketamine given. Moved pt to ED stretcher.  A  regular hospital bed was obtained, with ortho techs setting up traction, Conscious sedation - consent receieved verablly per Dr. Jearld Fenton from husband on the phone, TRN witness to this,  Sedation given, pt moved to hospital bed with multiple staff members and Charma Igo, PA supporting and then reducing left femur fracture.  Bucks traction placed on pt. Primary RN stayed with pt until her Aldete score was normal.    Bedside handoff with ED RN Lynnette Caffey  Trauma Response RN  Please call TRN at [phone] for further assistance.

## 2023-02-18 NOTE — TOC CAGE-AID Note (Signed)
Transition of Care [facility]) - CAGE-AID Screening   Patient Details  Name: Teresa [name] MRN: [id number] Date of Birth: 11/14/1955  Hewitt Shorts, RN Trauma Response Nurse Phone Number: [phone] 02/18/2023, 3:24 PM   Clinical Narrative:  Pt had a GLF, with a displaced complete left femur fracture. Plan is to go to the OR today. Denies any alcohol/drug use. Is a patinet of pain management per hx in chart  CAGE-AID Screening:    Have You Ever Felt You Ought to Cut Down on Your Drinking or Drug Use?: No Have People Annoyed You By [employer] Your Drinking Or Drug Use?: No Have You Felt Bad Or Guilty About Your Drinking Or Drug Use?: No Have You Ever Had a Drink or Used Drugs First Thing In The Morning to Steady Your Nerves or to Get Rid of a Hangover?: No CAGE-AID Score: 0  Substance Abuse Education Offered: No (States she has never smoked, does not drink alcohol.)

## 2023-02-18 NOTE — Progress Notes (Signed)
Orthopedic Tech Progress Note Patient Details:  Teresa [name] 1956-06-23 [id number] 10lbs bucks traction was applied during conscious sedation per PA Leotis Shames  Musculoskeletal Traction Type of Traction: Bucks Skin Traction Traction Location: Left leg Traction Weight: 10 lbs   Post Interventions Patient Tolerated: Well  [name] Sorin Frimpong 02/18/2023, 10:28 AM

## 2023-02-19 ENCOUNTER — Telehealth: Payer: Self-pay | Admitting: Orthopedic Surgery

## 2023-02-19 ENCOUNTER — Encounter: Payer: Self-pay | Admitting: Orthopedic Surgery

## 2023-02-19 ENCOUNTER — Other Ambulatory Visit: Payer: Self-pay

## 2023-02-19 DIAGNOSIS — R2681 Unsteadiness on feet: Secondary | ICD-10-CM | POA: Diagnosis not present

## 2023-02-19 LAB — LACTIC ACID, PLASMA: Lactic Acid, Venous: 5 mmol/L (ref 0.5–1.9)

## 2023-02-19 LAB — CBC
HCT: 20.8 % — ABNORMAL LOW (ref 36.0–46.0)
Hemoglobin: 6.3 g/dL — CL (ref 12.0–15.0)
MCH: 28 pg (ref 26.0–34.0)
MCHC: 30.3 g/dL (ref 30.0–36.0)
MCV: 92.4 fL (ref 80.0–100.0)
Platelets: 293 10*3/uL (ref 150–400)
RBC: 2.25 MIL/uL — ABNORMAL LOW (ref 3.87–5.11)
RDW: 13.5 % (ref 11.5–15.5)
WBC: 19.5 10*3/uL — ABNORMAL HIGH (ref 4.0–10.5)
nRBC: 0 % (ref 0.0–0.2)

## 2023-02-19 LAB — BASIC METABOLIC PANEL
Anion gap: 8 (ref 5–15)
BUN: 15 mg/dL (ref 8–23)
CO2: 18 mmol/L — ABNORMAL LOW (ref 22–32)
Calcium: 7.6 mg/dL — ABNORMAL LOW (ref 8.9–10.3)
Chloride: 107 mmol/L (ref 98–111)
Creatinine, Ser: 0.99 mg/dL (ref 0.44–1.00)
GFR, Estimated: >60 mL/min (ref >60)
Glucose, Bld: 159 mg/dL — ABNORMAL HIGH (ref 70–99)
Potassium: 4 mmol/L (ref 3.5–5.1)
Sodium: 133 mmol/L — ABNORMAL LOW (ref 135–145)

## 2023-02-19 LAB — HEMOGLOBIN AND HEMATOCRIT, BLOOD
HCT: 23.9 % — ABNORMAL LOW (ref 36.0–46.0)
Hemoglobin: 7.5 g/dL — ABNORMAL LOW (ref 12.0–15.0)

## 2023-02-19 MED ORDER — SENNA 8.6 MG PO TABS: 1.0000 | ORAL_TABLET | Freq: Two times a day (BID) | ORAL | 0 refills | Status: AC

## 2023-02-19 MED ORDER — TIZANIDINE HCL 4 MG PO TABS: 4.0000 mg | ORAL_TABLET | Freq: Three times a day (TID) | ORAL | 0 refills | Status: DC

## 2023-02-19 MED ORDER — ACETAMINOPHEN 500 MG PO TABS: 1000.0000 mg | ORAL_TABLET | Freq: Three times a day (TID) | ORAL | 0 refills | Status: DC

## 2023-02-19 MED ORDER — POLYETHYLENE GLYCOL 3350 17 G PO PACK: 17.0000 g | PACK | Freq: Every day | ORAL | 0 refills | Status: AC | PRN

## 2023-02-19 MED ORDER — OXYCODONE HCL 5 MG PO TABS: 5.0000 mg | ORAL_TABLET | ORAL | 0 refills | Status: DC | PRN

## 2023-02-19 MED ORDER — ASPIRIN 81 MG PO TBEC: 81.0000 mg | DELAYED_RELEASE_TABLET | Freq: Two times a day (BID) | ORAL | 0 refills | Status: DC

## 2023-02-19 MED ORDER — OXYCODONE-ACETAMINOPHEN 10-325 MG PO TABS: 1.0000 | ORAL_TABLET | ORAL | 0 refills | Status: AC | PRN

## 2023-02-19 MED ORDER — LACTATED RINGERS IV BOLUS: 1000.0000 mL | Freq: Once | INTRAVENOUS | Status: DC

## 2023-02-19 MED ORDER — SODIUM CHLORIDE 0.9 % IV SOLN
250.0000 mg | Freq: Once | INTRAVENOUS | Status: DC
  Filled 2023-02-19: qty 20

## 2023-02-19 NOTE — Progress Notes (Signed)
Patient left hospital AMA.  MD aware.  She was informed of her critical lab value of lactic acid of 5.0 and the risks of leaving. Patient stated she did not want to be in this hospital anymore and left AMA

## 2023-02-19 NOTE — Progress Notes (Signed)
Patient refused all meds.  Claimed they did not want to pay for medications that they have at home.  Anxious for discharge.  MD aware

## 2023-02-19 NOTE — Significant Event (Addendum)
  Teresa [name] is a 16 year older person living with osteoporosis with history of L5 compression fracture, HTN,  chronic migraines, GAD who presented after a mechanical fall found to have a closed L femoral shaft fracture s/p intramedullary rodding with Dr. Tonny Bollman POD1  Patient was examined at bedside this AM; she shared desire to be discharged from the hospital despite an acute drop in her Hgb from 12.0 to 6.3 after surgery. Because of religion reasons, patient did not consent to blood products but received IV iron supplementation. Repeat Hgb increase to 7.5. Patient planned to be discharged during morning rounds with strict instructions to continue PO iron supplementation, close follow up with PCP, and home health physical therapy. Orthopedic surgery had provided instructions for DVT prophylaxis prior and had sent medications to patient's pharmacy.  However, around 11 AM this MD received message from RN about critical lactic acid result of 5.0. Discharge orders were cancelled, and MD and RN were at bedside for further discussion.  Shared with patient the importance of staying in the hospital for continued work up as we could not ascertain the etiology of her elevated lactic acid without further evaluation. Given that patient did not seem amenable to staying for further work up, patient was offered to receive one bolus of IV fluid resuscitation and rechecking the lactic acid prior to discharge. However, patient was adamant about leaving the hospital, stating that she could follow up with her PCP in the next 2 weeks.   RN and supporting staff in the room shared their concerns about her leaving against medical without success. Then, we transitioned to provide instructions to her and husband present in the room such as aggressive oral hydration for the next 24 hours, signs and symptoms that would prompt her to seek immediate medical evaluation, and prompt follow up with her PCP early next week  (4/22-4/25) for repeat blood work.  Patient signed AMA paperwork, after which she vacated her room per RN report.  Morene Crocker, MD [facility] Internal Medicine Program - PGY-1 02/19/2023, 3:13 PM

## 2023-02-19 NOTE — Progress Notes (Addendum)
HD#1 Subjective:  Teresa [name] is a 67 y.o. female with pertinent PMH of iron deficiency anemia, osteoporosis with L5 compression fracture, hypertension, chronic migraines, and GAD who presented after a fall and is admitted for a left femur fracture and underwent intramedullary rod placement on 02/18/2023.   Patient with nursing for critical lab, hemoglobin of 6.3 down from 12 yesterday.  The patient is having issues with her chronic back pain due to her L5 compression fractures but otherwise has no acute complaints and her left leg is actually better than before surgery.  She is hungry and has not had a bowel movement, abdominal pain, nausea, or vomiting.  No known bleeding.  Discussed at length with her preferences for blood products which she states she will continue to refuse under any circumstance.  Discussed erythropoietin which she is unsure about but will look up.  She is okay with iron infusions.  Objective:  Vital signs in last 24 hours: Vitals:   02/18/23 2115 02/18/23 2137 02/19/23 0035 02/19/23 0507  BP: (!) 102/54 96/62 (!) 102/55 111/60  Pulse: 99 88 (!) 102 93  Resp: Temp: 98 F (36.7 C) 97.8 F (36.6 C) 98.2 F (36.8 C) 98.4 F (36.9 C)  TempSrc:  Oral  Oral  SpO2: 100% 99% 100% 97%  Weight:      Height:       Supplemental O2: Room Air SpO2: 97 % O2 Flow Rate (L/min): 2 L/min   Physical Exam:  Tired appearing thin elderly female laying in bed, in no acute distress.  Breathing comfortably on room air.  Filed Weights   02/18/23 1000  Weight: 59 kg      Intake/Output Summary (Last 24 hours) at 02/19/2023 0545 Last data filed at 02/18/2023 2009 Gross per 24 hour  Intake 1550 ml  Output 200 ml  Net 1350 ml   Net IO Since Admission: 1,350 mL [02/19/23 0545]  Pertinent Labs:    Latest Ref Rng & Units 02/19/2023    3:37 AM 02/18/2023   11:00 AM 02/18/2023   10:47 AM  CBC  WBC 4.0 - 10.5 K/uL 19.5   17.8   Hemoglobin 12.0 - 15.0 g/dL 6.3  28.4   13.2   Hematocrit 36.0 - 46.0 % 20.8  39.0  36.7   Platelets 150 - 400 K/uL 293   370        Latest Ref Rng & Units 02/19/2023    3:37 AM 02/18/2023   11:00 AM 02/18/2023   10:47 AM  CMP  Glucose 70 - 99 mg/dL 440  102  725   BUN 8 - 23 mg/dL Creatinine 0.44 - 1.00 mg/dL 3.66  4.40  3.47   Sodium 135 - 145 mmol/L 133  140  137   Potassium 3.5 - 5.1 mmol/L 4.0  3.8  3.7   Chloride 98 - 111 mmol/L 107  109  105   CO2 22 - 32 mmol/L 18   19   Calcium 8.9 - 10.3 mg/dL 7.6   9.0   Total Protein 6.5 - 8.1 g/dL   6.4   Total Bilirubin 0.3 - 1.2 mg/dL   0.2   Alkaline Phos 38 - 126 U/L   34   AST 15 - 41 U/L   22   ALT 0 - 44 U/L   17     Assessment/Plan:   She does have a significantly low hemoglobin without any  known source of ongoing bleeding.  Underwent surgery yesterday, is net 1400 mL up, wheeze 59 kg with a BMI of 21 which all could be contributing factors to her anemia along with her history of iron deficiency anemia.  She notes that she has been down with a hemoglobin of 3 before but we do not have records of this.  Overall she is hemodynamically stable and we will redraw an H&H with pediatric tubes to note any trend.  Will also treat with IV iron, which she has got in the past and is okay with.  Rocky Morel, DO Internal Medicine Resident PGY-1 Pager: [phone] Please contact the on call pager after 5 pm and on weekends at [phone].

## 2023-02-19 NOTE — TOC Transition Note (Addendum)
Transition of Care [facility]) - CM/SW Discharge Note   Patient Details  Name: Teresa [name] MRN: [id number] Date of Birth: Feb 24, 1956  [facility] CM/SW Contact:  Gordy Clement, RN Phone Number: 02/19/2023, 10:31 AM  UPDATE:    CM called [employer] to get update on DME.  Ordered at 10:19 and was told that it would be 45 minutes.   Reply pending    12:07  [employer] stated equipment was ten minutes away. Floor RN notified via secure chat   12:15  CN went to patients room and patient and Husband were gone . CM contacted [employer] and they will deliver to patient's home   Of note- Patient was also recommended 1L of fluids and a lactic acid recheck prior to DC but patient refused before signing her AMA paperwork and leaving with Husband      Clinical Narrative:    Patient to DC to home with Spouse  Patient has been referred to [facility] in [place].  A rolling walker and shower chair have been ordered and will be delivered bedside prior to DC from [employer]. Husband to transport home [name], [name] (Spouse)  [phone]       Barriers to Discharge: No Barriers Identified   Patient Goals and CMS Choice      Discharge Placement                         Discharge Plan and Services Additional resources added to the After Visit Summary for   In-house Referral: NA Discharge Planning Services: CM Consult Post Acute Care Choice: Durable Medical Equipment          DME Arranged: Shower stool, Walker rolling with seat DME Agency: [employer] Date DME Agency Contacted: 02/19/23 Time DME Agency Contacted: 1029 Representative spoke with at DME Agency: Vaughan Basta            Social Determinants of Health (SDOH) Interventions SDOH Screenings   Food Insecurity: No Food Insecurity (02/19/2023)  Housing: Low Risk  (02/19/2023)  Transportation Needs: No Transportation Needs (02/19/2023)  Utilities: Not At Risk (02/19/2023)  Depression (PHQ2-9): Low Risk  (10/23/2021)   Tobacco Use: Low Risk  (02/18/2023)     Readmission Risk Interventions     No data to display

## 2023-02-19 NOTE — Progress Notes (Addendum)
Pre-operative Scores  VAS leg: 10 SF-36:  -Physical functioning: 55  -Role limitations due to physical health: 0  -Role limitations due to emotional problems: 33.3  -Energy/fatigue: 25  -Emotional well-being: 45  -Social functioning: 50  -Pain: 25  -General health: 41   London Sheer, MD Orthopedic Surgeon

## 2023-02-19 NOTE — Telephone Encounter (Signed)
Orthopedic Note  Patient's [facility] notified me that they do not have oxycodone.  I canceled the Tylenol and oxycodone.  I sent in Percocet 10/325 to that pharmacy to be taken every 4 hours.  I called the patient to notify them of the change and instructed her to stop taking the Tylenol and just use the Percocet.  Patient expressed understanding.  Teresa Sheer, MD Orthopedic Surgeon

## 2023-02-19 NOTE — Evaluation (Signed)
Physical Therapy Evaluation Patient Details Name: Teresa [name] MRN: [id number] DOB: 04/11/1956 Today's Date: 02/19/2023  History of Present Illness  67 y.o. female with admitted 4/17  with left femur fracture status post intramedullary rodding.  PMH of iron deficiency anemia, osteoporosis with L5 compression fracture, hypertension, chronic migraines, and GAD.  Clinical Impression  Patient is s/p above surgery resulting in functional limitations due to the deficits listed below (see PT Problem List). Safely completed stair training, stood 10 minutes, and transfers without physical assistance. Ambulatory distance limited after prolonged standing and stair training but performed 20 feet safely with RW for support, no evidence of buckling, mild dizziness reported. She is eager to go home today. All questions answered Will continue to follow and progress throughout duration of admission. Pt and husband agreeable with OPPT follow-up.         Recommendations for follow up therapy are one component of a multi-disciplinary discharge planning process, led by the attending physician.  Recommendations may be updated based on patient status, additional functional criteria and insurance authorization.     Assistance Recommended at Discharge Intermittent Supervision/Assistance  Patient can return home with the following  A little help with walking and/or transfers;A little help with bathing/dressing/bathroom;Assist for transportation;Help with stairs or ramp for entrance    Equipment Recommendations Rolling walker (2 wheels)     Functional Status Assessment Patient has had a recent decline in their functional status and demonstrates the ability to make significant improvements in function in a reasonable and predictable amount of time.     Precautions / Restrictions Precautions Precautions: Fall Restrictions Weight Bearing Restrictions: Yes LLE Weight Bearing: Weight bearing as tolerated       Mobility  Bed Mobility Overal bed mobility: Modified Independent             General bed mobility comments: extra time    Transfers Overall transfer level: Needs assistance Equipment used: Rolling walker (2 wheels) Transfers: Sit to/from Stand, Bed to chair/wheelchair/BSC Sit to Stand: Supervision   Step pivot transfers: Supervision       General transfer comment: Supervision for safety. Cues for safety and technique. Tolerates fair WB through LLE. Performed from bed and recliner with appropriate hand placement. Step pivot to recliner with supervision    Ambulation/Gait Ambulation/Gait assistance: Supervision Gait Distance (Feet): 20 Feet Assistive device: Rolling walker (2 wheels) Gait Pattern/deviations: Step-to pattern, Decreased stride length, Decreased stance time - left, Antalgic Gait velocity: decr Gait velocity interpretation: <1.31 ft/sec, indicative of household ambulator   General Gait Details: Educated on safe AD use with RW, no buckling noted. Cues for sequencing. Good UE support from walker, tolerating WB through LLE well. Mild dizziness reported. Distance limited by standing 10 min and navigating stairs prior to ambulating.  Stairs Stairs: Yes Stairs assistance: Min guard Stair Management: No rails, Step to pattern, Sideways, Backwards, With walker, One rail Left Number of Stairs: 2 (x2) General stair comments: Practiced stairs with husband present and participating. Performed lateral approach with single rail and posterior approach with RW, min guard for RW block. Feels confident with technique. Performed safely.  Wheelchair Mobility    Modified Rankin (Stroke Patients Only)       Balance Overall balance assessment: Needs assistance Sitting-balance support: No upper extremity supported, Feet supported Sitting balance-Leahy Scale: Good     Standing balance support: No upper extremity supported Standing balance-Leahy Scale: Fair  Pertinent Vitals/Pain Pain Assessment Pain Assessment: Faces Faces Pain Scale: Hurts a little bit Pain Location: Lt hip Pain Descriptors / Indicators: Operative site guarding Pain Intervention(s): Monitored during session, Repositioned    Home Living Family/patient expects to be discharged to:: Private residence Living Arrangements: Spouse/significant other Available Help at Discharge: Family Type of Home: House Home Access: Stairs to enter Entrance Stairs-Rails: Left Entrance Stairs-Number of Steps: 2   Home Layout: One level Home Equipment: None      Prior Function Prior Level of Function : Independent/Modified Independent             Mobility Comments: ind ADLs Comments: ind     Hand Dominance        Extremity/Trunk Assessment   Upper Extremity Assessment Upper Extremity Assessment: Defer to OT evaluation    Lower Extremity Assessment Lower Extremity Assessment: LLE deficits/detail LLE Deficits / Details: post op weakness, guarding edema as expected LLE: Unable to fully assess due to pain       Communication   Communication: No difficulties  Cognition Arousal/Alertness: Awake/alert Behavior During Therapy: WFL for tasks assessed/performed Overall Cognitive Status: Within Functional Limits for tasks assessed                                          General Comments      Exercises General Exercises - Lower Extremity Ankle Circles/Pumps: AROM, Both, 10 reps, Seated Quad Sets: Strengthening, Both, 5 reps, Seated Gluteal Sets: Strengthening, Both, 5 reps, Seated Hip ABduction/ADduction: Strengthening, Both, 5 reps, Seated   Assessment/Plan    PT Assessment Patient needs continued PT services  PT Problem List Decreased strength;Decreased activity tolerance;Decreased range of motion;Decreased balance;Decreased mobility;Decreased knowledge of use of DME;Pain       PT Treatment Interventions DME  instruction;Gait training;Functional mobility training;Stair training;Therapeutic activities;Therapeutic exercise;Balance training;Neuromuscular re-education;Patient/family education;Modalities    PT Goals (Current goals can be found in the Care Plan section)  Acute Rehab PT Goals Patient Stated Goal: go home today PT Goal Formulation: With patient Time For Goal Achievement: 02/26/23 Potential to Achieve Goals: Good    Frequency Min 5X/week     Co-evaluation               AM-PAC PT "6 Clicks" Mobility  Outcome Measure Help needed turning from your back to your side while in a flat bed without using bedrails?: None Help needed moving from lying on your back to sitting on the side of a flat bed without using bedrails?: None Help needed moving to and from a bed to a chair (including a wheelchair)?: A Little Help needed standing up from a chair using your arms (e.g., wheelchair or bedside chair)?: A Little Help needed to walk in hospital room?: A Little Help needed climbing 3-5 steps with a railing? : A Little 6 Click Score: 20    End of Session Equipment Utilized During Treatment: Gait belt Activity Tolerance: Patient tolerated treatment well Patient left: in chair;with call bell/phone within reach;with chair alarm set;with family/visitor present;with nursing/sitter in room Nurse Communication: Mobility status PT Visit Diagnosis: Other abnormalities of gait and mobility (R26.89);Difficulty in walking, not elsewhere classified (R26.2);Pain Pain - Right/Left: Left Pain - part of body: Hip    Time: 1610-9604 PT Time Calculation (min) (ACUTE ONLY): 34 min   Charges:   PT Evaluation $PT Eval Low Complexity: 1 Low PT Treatments $Gait Training: 8-22 mins  Kathlyn Sacramento, PT, DPT Physical Therapist Acute Rehabilitation Services [facility] Out[facility] Rehabilitation Services [facility]   Berton Mount 02/19/2023, 9:42 AM

## 2023-02-19 NOTE — Evaluation (Signed)
Occupational Therapy Evaluation Patient Details Name: Teresa [name] MRN: [id number] DOB: [birth year] Today's Date: 02/19/2023   History of Present Illness 67 y.o. female with admitted 4/17  with left femur fracture status post intramedullary rodding.  PMH of iron deficiency anemia, osteoporosis with L5 compression fracture, hypertension, chronic migraines, and GAD.   Clinical Impression   Pt s/p L femur intramedullary rodding. Pt displays good overall strength, balance, safety awareness, and able to performed transfers/ambulation with supervision. Pt needs min A for LB dressing, lives with husband who is able to help as needed.  Pt would benefit from RW and shower chair prior to DC to ensure safe transition to home. No further or follow up OT needed.     Recommendations for follow up therapy are one component of a multi-disciplinary discharge planning process, led by the attending physician.  Recommendations may be updated based on patient status, additional functional criteria and insurance authorization.   Assistance Recommended at Discharge Intermittent Supervision/Assistance  Patient can return home with the following A little help with walking and/or transfers;A little help with bathing/dressing/bathroom;Assistance with cooking/housework;Assist for transportation;Help with stairs or ramp for entrance    Functional Status Assessment  Patient has had a recent decline in their functional status and demonstrates the ability to make significant improvements in function in a reasonable and predictable amount of time.  Equipment Recommendations  Tub/shower seat;Other (comment) (RW)    Recommendations for Other Services       Precautions / Restrictions Precautions Precautions: Fall Restrictions Weight Bearing Restrictions: Yes LLE Weight Bearing: Weight bearing as tolerated      Mobility Bed Mobility Overal bed mobility: Modified Independent             General bed mobility  comments: extra time    Transfers Overall transfer level: Needs assistance Equipment used: Rolling walker (2 wheels) Transfers: Sit to/from Stand, Bed to chair/wheelchair/BSC Sit to Stand: Supervision     Step pivot transfers: Supervision     General transfer comment: Pt displays good safety awareness and hand placement for transfers with [place] of RW.      Balance Overall balance assessment: Needs assistance Sitting-balance support: No upper extremity supported, Feet supported Sitting balance-Leahy Scale: Good     Standing balance support: No upper extremity supported Standing balance-Leahy Scale: Fair Standing balance comment: able to perform ADLs at sink standing unsupported.                           ADL either performed or assessed with clinical judgement   ADL Overall ADL's : Needs assistance/impaired Eating/Feeding: Independent   Grooming: Modified independent   Upper Body Bathing: Modified independent   Lower Body Bathing: Minimal assistance   Upper Body Dressing : Modified independent   Lower Body Dressing: Minimal assistance   Toilet Transfer: Supervision/safety   Toileting- Clothing Manipulation and Hygiene: Modified independent   Tub/ Shower Transfer: Supervision/safety   Functional mobility during ADLs: Supervision/safety General ADL Comments: Pt has difficulty with LB ADLs due to LLE pain and stiffness.     Vision Baseline Vision/History: 1 Wears glasses Ability to See in Adequate Light: 0 Adequate Patient Visual Report: No change from baseline       Perception     Praxis      Pertinent Vitals/Pain Pain Assessment Pain Assessment: 0-10 Pain Score: 4  Faces Pain Scale: Hurts little more Pain Location: Lt hip Pain Descriptors / Indicators: Operative site guarding Pain Intervention(s): Monitored  during session     Hand Dominance     Extremity/Trunk Assessment Upper Extremity Assessment Upper Extremity Assessment: Overall  WFL for tasks assessed   Lower Extremity Assessment Lower Extremity Assessment: Defer to PT evaluation LLE Deficits / Details: post op weakness, guarding edema as expected LLE: Unable to fully assess due to pain       Communication Communication Communication: No difficulties   Cognition Arousal/Alertness: Awake/alert Behavior During Therapy: WFL for tasks assessed/performed Overall Cognitive Status: Within Functional Limits for tasks assessed                                       General Comments       Exercises     Shoulder Instructions      Home Living Family/patient expects to be discharged to:: Private residence Living Arrangements: Spouse/significant other Available Help at Discharge: Family Type of Home: House Home Access: Stairs to enter Secretary/administrator of Steps: 2 Entrance Stairs-Rails: Left Home Layout: One level     Bathroom Shower/Tub: Runner, broadcasting/film/video: None          Prior Functioning/Environment Prior Level of Function : Independent/Modified Independent             Mobility Comments: ind ADLs Comments: ind        OT Problem List: Decreased strength;Decreased range of motion;Decreased activity tolerance;Impaired balance (sitting and/or standing);Pain      OT Treatment/Interventions:      OT Goals(Current goals can be found in the care plan section) Acute Rehab OT Goals Patient Stated Goal: to return home with husband OT Goal Formulation: With patient Time For Goal Achievement: 03/05/23 Potential to Achieve Goals: Good  OT Frequency:      Co-evaluation              AM-PAC OT "6 Clicks" Daily Activity     Outcome Measure Help from another person eating meals?: None Help from another person taking care of personal grooming?: None Help from another person toileting, which includes using toliet, bedpan, or urinal?: None Help from another person bathing (including washing, rinsing,  drying)?: A Little Help from another person to put on and taking off regular upper body clothing?: None Help from another person to put on and taking off regular lower body clothing?: A Little 6 Click Score: 22   End of Session Equipment Utilized During Treatment: Rolling walker (2 wheels) Nurse Communication: Mobility status  Activity Tolerance: Patient tolerated treatment well Patient left: in chair;with call bell/phone within reach;with chair alarm set;with family/visitor present  OT Visit Diagnosis: Unsteadiness on feet (R26.81);Muscle weakness (generalized) (M62.81);Pain;Other abnormalities of gait and mobility (R26.89) Pain - Right/Left: Left Pain - part of body: Leg                Time: 4098-1191 OT Time Calculation (min): 25 min Charges:  OT General Charges $OT Visit: 1 Visit OT Evaluation $OT Eval Low Complexity: 1 Low OT Treatments $Self Care/Home Management : 8-22 mins  [place], OTR/L   Alexis Goodell 02/19/2023, 10:26 AM

## 2023-02-19 NOTE — Progress Notes (Signed)
Orthopedic Surgery Progress Note   Assessment: Patient is a 67 y.o. female with left femur fracture status post intramedullary rodding   Plan: -Operative plans: complete -Diet: regular -DVT ppx: aspirin  BID (will hold with her anemia), plan to discharge after discharge -Antibiotics: ancef x2 post-op doses -Weight bearing status: as tolerated -PT evaluate and treat -Pain control -Dispo: per primary  ___________________________________________________________________________  Subjective: Hemoglobin dropped to 6.3 on morning labs. No acute events overnight. Not lightheaded, dizzy, fatigued, short of breath, feeling weak. Biggest complaint is low back pain from laying in the hospital bed. Says she has chronic low back pain and wants to get back to her bed at home. Wants to be discharged as soon as possible as a result. Thigh pain is better since surgery. Denies paresthesias and numbness.    Physical Exam:  General: no acute distress, appears stated age Neurologic: alert, answering questions appropriately, following commands Respiratory: unlabored breathing on room air, symmetric chest rise Psychiatric: appropriate affect, normal cadence to speech  MSK:   -Left lower extremity  Dressings c/d/i EHL/TA/GSC intact Plantarflexes and dorsiflexes toes Sensation intact to light touch in sural, saphenous, tibial, deep peroneal, and superficial peroneal nerve distributions Foot warm and well perfused, palpable DP pulse   Patient name: Teresa [name] Patient MRN: [id number] Date: 02/19/23

## 2023-02-19 NOTE — Progress Notes (Signed)
On call IMTS provider informed of critical lab result and refusal of topamax, will cont to monitor and reorder lactic acid lab

## 2023-02-20 ENCOUNTER — Encounter (HOSPITAL_COMMUNITY): Payer: Self-pay | Admitting: Orthopedic Surgery

## 2023-02-20 NOTE — Discharge Summary (Addendum)
Name: Teresa [name] MRN: [id number] DOB: December 10, 1955 67 y.o. PCP: Barbette Reichmann, MD  Date of Admission: 02/18/2023  9:39 AM Date of AMA discharge: 02/19/2023 Attending Physician: Dr. Sol Blazing  Discharge Diagnosis: Principal Problem:   Closed left femoral fracture    Discharge Medications: Allergies as of 02/19/2023       Reactions   Elastic Bandages & [zinc] Rash   Morphine Rash    Pt thinks she had rash after extended use        Medication List     STOP taking these medications    acetaminophen 500 MG tablet Commonly known as: TYLENOL   aspirin-acetaminophen-caffeine 250-250-65 MG tablet Commonly known as: EXCEDRIN MIGRAINE   [facility] VITAMIN B12 1000 MCG tablet Generic drug: cyanocobalamin       TAKE these medications    alendronate 70 MG tablet Commonly known as: FOSAMAX Take 1 tablet (70 mg total) by mouth every Wednesday. Take with a full glass of water on an empty stomach.   aspirin EC 81 MG tablet Take 1 tablet (81 mg total) by mouth in the morning and at bedtime. Swallow whole. Start taking on: February 23, 2023   CALTRATE 600+D3 PO Take 1 tablet by mouth daily with breakfast.   dicyclomine 20 MG tablet Commonly known as: BENTYL Take 20 mg by mouth 4 (four) times daily as needed (bloating/indigestion.).   ferrous gluconate 324 MG tablet Commonly known as: FERGON Take 648 mg by mouth daily before lunch.   FIBER CHOICE PO Take 1 tablet by mouth at bedtime. Health Plus Super Colon Cleanse   gabapentin 300 MG capsule Commonly known as: NEURONTIN Take 600 mg by mouth 2 (two) times daily. With breakfast & with lunch   nortriptyline 10 MG capsule Commonly known as: PAMELOR Take 50 mg by mouth daily with lunch.   oxyCODONE-acetaminophen 10-325 MG tablet Commonly known as: Percocet Take 1 tablet by mouth every 4 (four) hours as needed for up to 7 days for pain.   pantoprazole 40 MG tablet Commonly known as: PROTONIX Take by mouth.   polyethylene  glycol 17 g packet Commonly known as: MIRALAX / GLYCOLAX Take 17 g by mouth daily as needed for up to 14 days for mild constipation.   senna 8.6 MG Tabs tablet Commonly known as: SENOKOT Take 1 tablet (8.6 mg total) by mouth 2 (two) times daily for 14 days.   tiZANidine 4 MG tablet Commonly known as: Zanaflex Take 1 tablet (4 mg total) by mouth 3 (three) times daily. What changed:  medication strength how much to take when to take this reasons to take this   topiramate 100 MG tablet Commonly known as: TOPAMAX Take 100 mg by mouth at bedtime.   vitamin C 1000 MG tablet Take 1,000 mg by mouth daily before lunch.   Vitamin D3 125 MCG (5000 UT) Tabs Take 5,000 Units by mouth daily with breakfast.   zolpidem 12.5 MG CR tablet Commonly known as: AMBIEN CR Take 1 tablet by mouth at bedtime as needed.        Disposition and follow-up:   [name] was discharged AMA from [facility] in Serious condition.  At the hospital follow up visit please address:  1.  Follow-up:  *lactic acid elevation -lactic acid blood test at follow up  *acute on chronic anemia -follow up CBC  *L femoral fracture s/p intramedullary rodding -ensure follow up with orthopedics, home health physical therapy, and pain control  *Osteoporosis *High  risk for falls *Polypharmacy with centrally acting medications -Consider review and transitioning off of centrally acting medications given high risk for falls -Consider alternative therapy for osteoporosis given c/f atypical femur fracture  2.  Labs / imaging needed at time of follow-up: CBC, BMP, lactic acid  3.  Pending labs/ test needing follow-up: None  4.  Medication Changes   ADDED  - ASA 81 mg for 6 weeks starting 02/22/2022  - Percocet 10/325 q4HR  - Tylenol 1000 mg TID PRN  Follow-up Appointments: PCP - patient to follow up with PCP in 1 weeks  Hospital Course by problem list: Teresa [name] is a 12 year older  [employer] patient living with osteoporosis with history of L5 compression fracture, HTN,  chronic migraines, GAD who presented after a mechanical fall found to have a closed L femoral shaft fracture s/p intramedullary rodding with Dr. Tonny Bollman. Her course was complicated by acute blood loss anemia with partial IV iron supplementation. On day of discharge, patient's lactic acid resulted with a critical value of 5.0. Patient was not amenable to staying for work up or fluid resuscitation and lab recheck, and decided to leave the hospital against medical advice.  Displaced left femoral fracture s/p intramedullary rodding with Dr. Tonny Bollman Mechanical fall this morning while outside in the garden.  She does have a history of osteoporosis on Fosamax, vitamin D, and calcium.  Imaging shows left femoral diaphyseal fracture that is displaced.  Patient underwent  intramedullary nailing with orthopedic surgery. Per surgery, patient is to be on 6 weeks of ASA BID therapy for DVT prophylaxis. She is to follow in their clinic in 2 weeks and to continue working with home health physical therapy.   Elevated lactate MD received message from RN about critical lactic acid result of 5.0. Discharge orders were cancelled, and MD and RN were at bedside for further discussion. Shared with patient the importance of staying in the hospital for continued work up as we could not ascertain the etiology of her elevated lactic acid without further evaluation. Given that patient did not seem amenable to staying for further work up, patient was offered to receive one bolus of IV fluid resuscitation and rechecking the lactic acid prior to discharge. However, patient was adamant about leaving the hospital, stating that she could follow up with her PCP in the next 2 weeks.   Acute on chronic anemia [employer] Hgb stable at 12 in admission with drop to 6.5 after L femur intramedullary rodding with Dr. Tonny Bollman  on 4/17. Because of religion reasons, patient did not consent to blood products but received IV iron supplementation. Repeat Hgb increase to 7.5. Discussed with patient our concerns about increased risks for discharge with low blood counts, especially after surgery. But if adamant about leaving, she was to continue PO iron supplementation, close follow up with PCP, or seek medical examination if symptomatic after leaving the hospital.    Osteoporosis History of L5 compression fracture. DXA in 2023 with T-score of -2.8 at the left femoral neck and -3.8 at the forearm.  On alendronate, calcium and vitamin D. Of note, patient's L femur fracture was in atypical location  (diaphysis), which can be associated with Alendronate therapy. Consider close monitoring and evaluation for transitioning off of this medication to decrease future risk   Chronic migraines Continued on home amitriptyline and topiramate   Hypertension Monitored during inpatient  Insomnia Continued on home zolpidem    Discharge Subjective: Patient left hospital against  medical advice   Discharge Exam:   Blood pressure (!) 108/46, pulse 93, temperature (!) 97.5 F (36.4 C), temperature source Oral, resp. rate 17, height 5\' 6"  (1.676 m), weight 59 kg, SpO2 99 %.  Constitutional:Chronically ill-appearing woman sitting in bed, in no acute distress HENT: mucous membranes moist Cardiovascular: regular rate and rhythm, no m/r/g Pulmonary/Chest: normal work of breathing on room air, lungs clear to auscultation bilaterally. No crackles  Abdominal: soft, non-tender, non-distended.  Neurological: alert & oriented x 3 MSK: no gross abnormalities. L surgical incisions without overt tenderness, erythema, edema, or drainage Skin: warm and dry Psych: Normal mood and affect  Pertinent Labs, Studies, and Procedures:     Latest Ref Rng & Units 02/19/2023    6:27 AM 02/19/2023    3:37 AM 02/18/2023   11:00 AM  CBC  WBC 4.0 - 10.5 K/uL  19.5     Hemoglobin 12.0 - 15.0 g/dL 7.5  6.3  16.1   Hematocrit 36.0 - 46.0 % 23.9  20.8  39.0   Platelets 150 - 400 K/uL  293         Latest Ref Rng & Units 02/19/2023    3:37 AM 02/18/2023   11:00 AM 02/18/2023   10:47 AM  CMP  Glucose 70 - 99 mg/dL 096  045  409   BUN 8 - 23 mg/dL 15  18  17    Creatinine 0.44 - 1.00 mg/dL 8.11  9.14  7.82   Sodium 135 - 145 mmol/L 133  140  137   Potassium 3.5 - 5.1 mmol/L 4.0  3.8  3.7   Chloride 98 - 111 mmol/L 107  109  105   CO2 22 - 32 mmol/L 18   19   Calcium 8.9 - 10.3 mg/dL 7.6   9.0   Total Protein 6.5 - 8.1 g/dL   6.4   Total Bilirubin 0.3 - 1.2 mg/dL   0.2   Alkaline Phos 38 - 126 U/L   34   AST 15 - 41 U/L   22   ALT 0 - 44 U/L   17     DG FEMUR MIN 2 VIEWS LEFT  Result Date: 02/18/2023 CLINICAL DATA:  Postoperative state. EXAM: LEFT FEMUR 2 VIEWS COMPARISON:  Femur fluoroscopy from earlier today FINDINGS: Intramedullary nail for fixation of femoral diaphysis fracture. Proximal and distal screws are intact. Fracture is well aligned. No new fracture. Located knee and hip. IMPRESSION: Femoral nail fixation without complicating feature. Electronically Signed   By: Tiburcio Pea M.D.   On: 02/18/2023 21:25   DG FEMUR MIN 2 VIEWS LEFT  Result Date: 02/18/2023 CLINICAL DATA:  Elective surgery EXAM: LEFT FEMUR 2 VIEWS COMPARISON:  Preoperative imaging FINDINGS: Seven fluoroscopic spot views of the left femur obtained in the operating room. Intramedullary nail with trans trochanteric and distal locking screw fixation of femoral shaft fracture. Fluoroscopy time 4 minutes 24 seconds, dose 27.83 mGy. IMPRESSION: Intraoperative fluoroscopy during left femoral fracture ORIF. Electronically Signed   By: Narda Rutherford M.D.   On: 02/18/2023 20:22   DG C-Arm 1-60 Min-No Report  Result Date: 02/18/2023 Fluoroscopy was utilized by the requesting physician.  No radiographic interpretation.   DG C-Arm 1-60 Min-No Report  Result Date:  02/18/2023 Fluoroscopy was utilized by the requesting physician.  No radiographic interpretation.   CT HIP LEFT WO CONTRAST  Result Date: 02/18/2023 CLINICAL DATA:  Left femur fracture. Rule out femoral neck component. EXAM: CT OF THE LEFT HIP WITHOUT  CONTRAST TECHNIQUE: Multidetector CT imaging of the left hip was performed according to the standard protocol. Multiplanar CT image reconstructions were also generated. RADIATION DOSE REDUCTION: This exam was performed according to the departmental dose-optimization program which includes automated exposure control, adjustment of the mA and/or kV according to patient size and/or use of iterative reconstruction technique. COMPARISON:  Radiographs, same date. FINDINGS: Displaced midshaft femur fracture with 1 shaft width of medial displacement. There is approximately 2 cm of shortening. The femoral head is normally located. No femoral neck or intertrochanteric fracture. No AVN. Mild hip joint degenerative changes. The visualized bony pelvis is intact. No pelvic fractures. Moderate age advanced osteoporosis. Associated significant hematoma surrounding the femur fracture mainly involving the quadriceps muscles. IMPRESSION: 1. Displaced midshaft femur fracture with approximately 2 cm of shortening. 2. No femoral neck or intertrochanteric fracture. 3. Moderate age advanced osteoporosis. 4. Associated significant hematoma surrounding the femur fracture mainly involving the quadriceps muscles. Electronically Signed   By: Rudie Meyer M.D.   On: 02/18/2023 16:25   DG FEMUR PORT 1V LEFT  Result Date: 02/18/2023 CLINICAL DATA:  Postreduction. EXAM: LEFT FEMUR PORTABLE 1 VIEW COMPARISON:  Same day femur radiographs FINDINGS: Again seen is an oblique fracture of the left femoral diaphysis. There remains approximately 2.3 cm medial displacement and 1.2 cm proximal overlap of the distal fragment. A lateral image was not provided. Femoroacetabular alignment remains normal.  The left SI joint and symphysis pubis are intact. IMPRESSION: Persistent displaced left femoral diaphyseal fracture as above. A lateral image was not provided on the current study. Electronically Signed   By: Lesia Hausen M.D.   On: 02/18/2023 10:38   DG Pelvis Portable  Result Date: 02/18/2023 CLINICAL DATA:  67 year old female level 2 trauma status post fall. Left femur deformity. EXAM: PORTABLE PELVIS 1-2 VIEWS COMPARISON:  Left femur series today. CT Abdomen and Pelvis 07/13/2022. FINDINGS: Portable AP supine view at 1001 hours. Femoral heads remain normally located. Pelvis appears stable and intact. SI joints within normal limits. Grossly intact proximal right femur. Partially visible lumbar spine degeneration, mild dextroconvex scoliosis. Negative visible bowel gas. Proximal 3rd left femur shaft fracture deformity detailed separately. IMPRESSION: 1. No acute fracture or dislocation identified about the pelvis. 2. Left femur shaft fracture detailed separately. Electronically Signed   By: Odessa Fleming M.D.   On: 02/18/2023 10:28   DG FEMUR PORT 1V LEFT  Result Date: 02/18/2023 CLINICAL DATA:  67 year old female level 2 trauma status post fall. Left femur deformity. EXAM: LEFT FEMUR PORTABLE 1 VIEW COMPARISON:  None Available. FINDINGS: Three potable views at 0956 hours. Oblique and mildly comminuted fracture of the proximal left femur shaft. One full shaft width medial displacement and over-riding up to 3 cm on the portable AP view. On the lateral there is 1 full shaft width anterior displacement and nearly 45 degree anterior angulation of the distal fragment, along with overriding of about 3 cm. Both the left hip and knee appear to remain normally aligned. Grossly intact visible pelvis. IMPRESSION: Oblique fracture of the proximal left femur shaft with severe anterior angulation, 1 full shaft width medial and anterior displacement, overriding by 3 cm, mild comminution. Electronically Signed   By: Odessa Fleming  M.D.   On: 02/18/2023 10:27   DG Chest Port 1 View  Result Date: 02/18/2023 CLINICAL DATA:  67 year old female level 2 trauma status post fall. Left femur deformity. EXAM: PORTABLE CHEST 1 VIEW COMPARISON:  Chest CT 12/11/2020. FINDINGS: Portable AP supine view at  0959 hours. Mildly rotated to the right. Lung volumes and mediastinal contours are within normal limits. Visualized tracheal air column is within normal limits. Allowing for portable technique the lungs are clear. No pneumothorax or pleural effusion. No acute osseous abnormality identified. Paucity of bowel gas in the visible abdomen. IMPRESSION: No acute cardiopulmonary abnormality or acute traumatic injury identified. Electronically Signed   By: Odessa Fleming M.D.   On: 02/18/2023 10:09     Discharge Instructions: Discharge Instructions     Call MD for:  difficulty breathing, headache or visual disturbances   Complete by: As directed    Call MD for:  extreme fatigue   Complete by: As directed    Call MD for:  persistant dizziness or light-headedness   Complete by: As directed    Call MD for:  persistant nausea and vomiting   Complete by: As directed    Call MD for:  redness, tenderness, or signs of infection (pain, swelling, redness, odor or green/yellow discharge around incision site)   Complete by: As directed    Call MD for:  severe uncontrolled pain   Complete by: As directed    Call MD for:  temperature >100.4   Complete by: As directed    Increase activity slowly   Complete by: As directed        Signed: Morene Crocker, MD [facility] - PGY1 Pager: [phone] 02/20/2023, 3:33 PM

## 2023-02-23 ENCOUNTER — Telehealth: Payer: Self-pay

## 2023-02-23 DIAGNOSIS — S72342A Displaced spiral fracture of shaft of left femur, initial encounter for closed fracture: Secondary | ICD-10-CM

## 2023-02-23 NOTE — Telephone Encounter (Signed)
Patient had femur surgery with Dr.Moore on 02/18/2023. She is having increased pain today. She is currently taking oxycodone every 4hours. Patient is hoping something additional can be sent in.  Pharmacy-- [facility] [address] in [place]. [phone]

## 2023-02-23 NOTE — Telephone Encounter (Signed)
Orthopedic Telephone Call  Patient was having a lot of pain yesterday after her femur fracture intramedullary rodding.  Pain was tolerable the previous days.  Pain has gotten better today.  She and her husband note that she was not icing as frequently yesterday.  She has noticed ecchymosis around the posterior aspect of her thigh and medial aspect.  She has been taking Percocet 10 mg every 6 hours.  She has also been taking tizanidine.  Told her to go back to icing like she was.  I told her that she can take the Percocet every 4 hours and if needed, she could take 1.5 tablets every 4 hours.  She should continue to take the tizanidine 3 times per day.  I also told her to take the aspirin 81 mg twice daily.  She is having trouble mobilizing at home and is not able to get to outpatient physical therapy so referral was made for home health PT.  She said her incision sites look good and there has not been any swelling or drainage around those.  There is no redness around the incisions.  Patient has an appointment with me on April 29.  If there are further issues, instructed her to call the office and we may have to get her to come in sooner.  London Sheer, MD Orthopedic Surgeon

## 2023-02-23 NOTE — Addendum Note (Signed)
Addended by: Willia Craze on: 02/23/2023 05:37 PM   Modules accepted: Orders

## 2023-02-24 ENCOUNTER — Telehealth: Payer: Self-pay | Admitting: Orthopedic Surgery

## 2023-02-24 NOTE — Telephone Encounter (Signed)
Pt called stating she called her insurance company so she can start her physical therapy. Had surgery last week and really need to start right away. Pt states her insurance said  they only cover PT for [facility]. There phone number is [phone]. Please send referral right away. Pt phone number is

## 2023-02-25 ENCOUNTER — Telehealth: Payer: Self-pay | Admitting: Orthopedic Surgery

## 2023-02-25 NOTE — Telephone Encounter (Signed)
Patient called. She says she has not had any home PT yet. Nobody has called. [facility] will take her insurance, [phone]

## 2023-02-25 NOTE — Telephone Encounter (Signed)
Patient states she is still waiting on P/T orders and advising that she has call several times. Please fax orders to -[facility]- [phone]. Land line is --[phone] Patients states they need hx. Treatment and plan for P/T.

## 2023-02-25 NOTE — Telephone Encounter (Signed)
Duplicate message-- I called and advised pt that we are working on the referral that there is a process for it and it may take a couple of days, she states that she understands

## 2023-02-25 NOTE — Telephone Encounter (Signed)
Sent to [place] to fax info.

## 2023-02-25 NOTE — Telephone Encounter (Signed)
Referral faxed to number given 

## 2023-02-26 ENCOUNTER — Telehealth: Payer: Self-pay | Admitting: Orthopedic Surgery

## 2023-02-26 NOTE — Telephone Encounter (Signed)
Patient called in again stating [facility] is stating they have not received the Referral for PT and she sat on the phone with them for 15 mins today and confirmed all her information and they state she is not in their system, they are requesting we send over the referral again and call them after doing it to ensure we sent it the fax number is [phone] And the land line is [phone]  Please advise

## 2023-02-27 ENCOUNTER — Telehealth: Payer: Self-pay | Admitting: Orthopedic Surgery

## 2023-02-27 NOTE — Telephone Encounter (Signed)
Patient called. Says because the referral for PT for [facility] did not have urgent on it, they can not see her for another week. They need a updated referral to say urgent before they can start this weekend. Her call back number is [phone]

## 2023-02-27 NOTE — Telephone Encounter (Signed)
I refaxed x2 this a.m to number given. Called pt left vm letting her know I did refax- this has been faxed since 02/25/23

## 2023-03-02 ENCOUNTER — Encounter: Payer: Self-pay | Admitting: Orthopedic Surgery

## 2023-03-02 ENCOUNTER — Ambulatory Visit (INDEPENDENT_AMBULATORY_CARE_PROVIDER_SITE_OTHER): Payer: No Typology Code available for payment source | Admitting: Orthopedic Surgery

## 2023-03-02 ENCOUNTER — Other Ambulatory Visit (INDEPENDENT_AMBULATORY_CARE_PROVIDER_SITE_OTHER): Payer: No Typology Code available for payment source

## 2023-03-02 VITALS — Ht 66.0 in | Wt 130.0 lb

## 2023-03-02 DIAGNOSIS — S72342A Displaced spiral fracture of shaft of left femur, initial encounter for closed fracture: Secondary | ICD-10-CM

## 2023-03-02 MED ORDER — OXYCODONE HCL 5 MG PO TABS: 5.0000 mg | ORAL_TABLET | ORAL | 0 refills | Status: DC | PRN

## 2023-03-02 MED ORDER — METHOCARBAMOL 500 MG PO TABS: 500.0000 mg | ORAL_TABLET | Freq: Three times a day (TID) | ORAL | 0 refills | Status: AC | PRN

## 2023-03-02 NOTE — Progress Notes (Signed)
Orthopedic Surgery Post-operative Office Visit  Procedure: Left femur intramedullary rodding Date of Surgery: 02/18/2023 (~2 weeks post-op)  Assessment: Patient is a 67 y.o. who is still having significant pain, but has noticed improvement after intramedullary rodding   Plan: -Staples removed in the office -Okay to let soap/water run over the incision, but do not submerge -Weight bearing as tolerated -Pain management: oxycodone, tylenol, robaxin -Return to office in 4 weeks, x-rays needed at next visit: AP/lateral left femur  ___________________________________________________________________________   Subjective: Patient went home after leaving the hospital AMA. She has remained at home. She is able to ambulate around the house with a walker. Still having significant pain with longer distances. Has been taking oxycodone, tylenol, and robaxin. Feels her pain has been improving with time. Has not noticed any redness or drainage around her incisions. Ecchymosis over her posterior thigh has been resolving.   Objective:  General: no acute distress, appropriate affect Neurologic: alert, answering questions appropriately, following commands Respiratory: unlabored breathing on room air Skin: incision are well approximated with no erythema, induration, active/expressible drainage  MSK (LLE): EHL/TA/GSC intact Plantarflexes and dorsiflexes toes Sensation intact to light touch in sural, saphenous, tibial, deep peroneal, and superficial peroneal nerve distributions No pain with passive ankle dorsiflexion, no pain with palpation of the calf, no leg swelling/edema seen Foot warm and well perfused, palpable DP pulse  Imaging: X-rays of the left femur taken 03/02/2023 were independently reviewed and interpreted, showing intramedullary rod in place. There is no lucency around the interlocking or lag screws. Fracture appears reduced. No callus formation seen.    Patient name: Teresa [name] Patient MRN: [id number] Date of visit: 03/02/23

## 2023-03-03 ENCOUNTER — Telehealth: Payer: Self-pay | Admitting: Orthopedic Surgery

## 2023-03-03 DIAGNOSIS — D62 Acute posthemorrhagic anemia: Secondary | ICD-10-CM | POA: Diagnosis not present

## 2023-03-03 MED ORDER — OXYCODONE HCL 5 MG PO TABS: 5.0000 mg | ORAL_TABLET | ORAL | 0 refills | Status: AC | PRN

## 2023-03-03 NOTE — Telephone Encounter (Signed)
Patient request can we call the [facility] [address] [facility] did not have Rx in stock

## 2023-03-03 NOTE — Telephone Encounter (Signed)
I called patient and advised. 

## 2023-03-03 NOTE — Telephone Encounter (Signed)
Please advise 

## 2023-03-03 NOTE — Addendum Note (Signed)
Addended by: Willia Craze on: 03/03/2023 04:18 PM   Modules accepted: Orders

## 2023-03-05 ENCOUNTER — Telehealth: Payer: Self-pay

## 2023-03-05 DIAGNOSIS — F411 Generalized anxiety disorder: Secondary | ICD-10-CM | POA: Diagnosis not present

## 2023-03-05 DIAGNOSIS — S72342D Displaced spiral fracture of shaft of left femur, subsequent encounter for closed fracture with routine healing: Secondary | ICD-10-CM | POA: Diagnosis not present

## 2023-03-05 DIAGNOSIS — Z79891 Long term (current) use of opiate analgesic: Secondary | ICD-10-CM | POA: Diagnosis not present

## 2023-03-05 DIAGNOSIS — M810 Age-related osteoporosis without current pathological fracture: Secondary | ICD-10-CM | POA: Diagnosis not present

## 2023-03-05 DIAGNOSIS — I10 Essential (primary) hypertension: Secondary | ICD-10-CM | POA: Diagnosis not present

## 2023-03-05 DIAGNOSIS — Z7982 Long term (current) use of aspirin: Secondary | ICD-10-CM | POA: Diagnosis not present

## 2023-03-05 DIAGNOSIS — W1839XD Other fall on same level, subsequent encounter: Secondary | ICD-10-CM | POA: Diagnosis not present

## 2023-03-05 DIAGNOSIS — G43919 Migraine, unspecified, intractable, without status migrainosus: Secondary | ICD-10-CM | POA: Diagnosis not present

## 2023-03-05 DIAGNOSIS — D62 Acute posthemorrhagic anemia: Secondary | ICD-10-CM | POA: Diagnosis not present

## 2023-03-05 NOTE — Telephone Encounter (Signed)
Teresa [name] with [phone] HH called asking for orders for HHPT 1wk 1 and 2wk 3.  Please call her to advise [phone].  Also said she needed to report that patient had 10/10 pain. Also mentioned last PT visit scheduled after her OV patient refused HHPT due to pain. She said ok to [facility] if she does not answer.

## 2023-03-05 NOTE — Telephone Encounter (Signed)
I called and lmom giving verbal auth for HHPT

## 2023-03-09 ENCOUNTER — Encounter: Payer: Self-pay | Admitting: Orthopedic Surgery

## 2023-03-09 ENCOUNTER — Telehealth: Payer: Self-pay | Admitting: Orthopedic Surgery

## 2023-03-09 DIAGNOSIS — S72342D Displaced spiral fracture of shaft of left femur, subsequent encounter for closed fracture with routine healing: Secondary | ICD-10-CM | POA: Diagnosis not present

## 2023-03-09 DIAGNOSIS — G43919 Migraine, unspecified, intractable, without status migrainosus: Secondary | ICD-10-CM | POA: Diagnosis not present

## 2023-03-09 DIAGNOSIS — M810 Age-related osteoporosis without current pathological fracture: Secondary | ICD-10-CM | POA: Diagnosis not present

## 2023-03-09 DIAGNOSIS — D62 Acute posthemorrhagic anemia: Secondary | ICD-10-CM | POA: Diagnosis not present

## 2023-03-09 DIAGNOSIS — W1839XD Other fall on same level, subsequent encounter: Secondary | ICD-10-CM | POA: Diagnosis not present

## 2023-03-09 DIAGNOSIS — F411 Generalized anxiety disorder: Secondary | ICD-10-CM | POA: Diagnosis not present

## 2023-03-09 DIAGNOSIS — Z79891 Long term (current) use of opiate analgesic: Secondary | ICD-10-CM | POA: Diagnosis not present

## 2023-03-09 DIAGNOSIS — I10 Essential (primary) hypertension: Secondary | ICD-10-CM | POA: Diagnosis not present

## 2023-03-09 DIAGNOSIS — Z7982 Long term (current) use of aspirin: Secondary | ICD-10-CM | POA: Diagnosis not present

## 2023-03-09 NOTE — Telephone Encounter (Signed)
Patient wanting to speak to Dr. Christell Constant, Transferring call to Triage

## 2023-03-15 ENCOUNTER — Other Ambulatory Visit: Payer: Self-pay | Admitting: Orthopedic Surgery

## 2023-03-16 ENCOUNTER — Encounter: Payer: Self-pay | Admitting: Orthopedic Surgery

## 2023-03-18 ENCOUNTER — Telehealth: Payer: Self-pay

## 2023-03-18 MED ORDER — OXYCODONE HCL 5 MG PO TABS: 5.0000 mg | ORAL_TABLET | ORAL | 0 refills | Status: AC | PRN

## 2023-03-18 NOTE — Telephone Encounter (Signed)
Irving Burton, PT with [phone] home health would like to request an extension for Pt for 2 x week for 2wks and 1 x week for 1 wk.  Cb# [phone].  Please advise.  Thank you

## 2023-03-18 NOTE — Telephone Encounter (Signed)
I called and gave verbal  

## 2023-03-25 ENCOUNTER — Encounter: Payer: Self-pay | Admitting: Orthopedic Surgery

## 2023-03-25 MED ORDER — OXYCODONE HCL 5 MG PO TABS: 5.0000 mg | ORAL_TABLET | ORAL | 0 refills | Status: DC | PRN

## 2023-03-28 ENCOUNTER — Telehealth: Payer: Self-pay | Admitting: Orthopedic Surgery

## 2023-03-28 MED ORDER — OXYCODONE HCL 5 MG PO TABS: 5.0000 mg | ORAL_TABLET | ORAL | 0 refills | Status: AC | PRN

## 2023-03-28 NOTE — Telephone Encounter (Signed)
Orthopedic Telephone Call  Spoke with patient this morning. She is running low on oxycodone. Has had increased pain because she did a lot of walking yesterday with her cane. No new injury or trauma. She just wanted a refill sent to her [facility] on [address] Refill sent this morning. Told patient to call the office if there are any other issues and they will get through to me.   London Sheer, MD Orthopedic Surgeon

## 2023-03-31 ENCOUNTER — Encounter: Payer: Self-pay | Admitting: Orthopedic Surgery

## 2023-04-02 ENCOUNTER — Other Ambulatory Visit (INDEPENDENT_AMBULATORY_CARE_PROVIDER_SITE_OTHER): Payer: No Typology Code available for payment source

## 2023-04-02 ENCOUNTER — Ambulatory Visit: Payer: No Typology Code available for payment source | Admitting: Orthopedic Surgery

## 2023-04-02 DIAGNOSIS — S72342A Displaced spiral fracture of shaft of left femur, initial encounter for closed fracture: Secondary | ICD-10-CM

## 2023-04-02 NOTE — Progress Notes (Signed)
Orthopedic Surgery Post-operative Office Visit   Procedure: Left femur intramedullary rodding Date of Surgery: 02/18/2023 (~6 weeks post-op)   Assessment: Patient is a 67 y.o. who is still having pain but it is improving with time     Plan: -Operative plans complete -Okay to submerge wounds at this time -Pain management: weaning oxycodone, tylenol -Can stop aspirin for dvt ppx at this point -Continue with PT -I told her there are two philosophies when it comes to bisphosphonates. One is to continue them for life since the risk of hip fracture is greater than atypical femur fracture. Whereas, others feels that drug holidays should done to allow for bone remodeling. Since she is concerned about atypical femur fracture, she has stopped her fosamax. I told her after a couple of years, she should get back on a treatment for osteoporosis. It does not necessarily need to be fosamax -Return to office in 6 weeks, x-rays needed at next visit: AP/lateral left femur   ___________________________________________________________________________     Subjective: Patient reports that her pain has gotten better since the last time I saw her. She is taking less oxycodone. She has been able walk more. She is using a walker for long distances and a cane around the house.   We talked about her use of fosamax. She said she has been on that medication for over 15 years and is worried about atypical femur fractures. She is not having any right hip or thigh pain.    Objective:   General: no acute distress, appropriate affect Neurologic: alert, answering questions appropriately, following commands Respiratory: unlabored breathing on room air Skin: incision are well healed with no erythema, induration, active/expressible drainage   MSK (LLE): EHL/TA/GSC intact Plantarflexes and dorsiflexes toes Sensation intact to light touch in sural, saphenous, tibial, deep peroneal, and superficial peroneal nerve  distributions No pain with passive ankle dorsiflexion, no pain with palpation of the calf, no leg swelling/edema seen Foot warm and well perfused, palpable DP pulse   Imaging: X-rays of the left femur taken 04/02/2023 were independently reviewed and interpreted, showing intramedullary rod in place. There is a subtle lucency around the most distal interlocking screws at the far cortex. No other lucencies seen around the interlocking screws. No lucency seen around the screws in the femoral head. Fracture appears reduced. No callus formation seen. Alignment has been maintained since films on 02/18/2023.  XR of the right femur taken 04/02/2023 were independently reviewed and interpreted, showing no evidence of lateral wall thickening or cortical beaking. No fracture is seen.      Patient name: Teresa [name] Patient MRN: [id number] Date of visit: 04/02/23

## 2023-04-08 ENCOUNTER — Encounter: Payer: Self-pay | Admitting: Orthopedic Surgery

## 2023-04-08 DIAGNOSIS — I10 Essential (primary) hypertension: Secondary | ICD-10-CM | POA: Diagnosis not present

## 2023-04-08 DIAGNOSIS — S72342D Displaced spiral fracture of shaft of left femur, subsequent encounter for closed fracture with routine healing: Secondary | ICD-10-CM | POA: Diagnosis not present

## 2023-04-08 DIAGNOSIS — Z79891 Long term (current) use of opiate analgesic: Secondary | ICD-10-CM | POA: Diagnosis not present

## 2023-04-08 DIAGNOSIS — G43919 Migraine, unspecified, intractable, without status migrainosus: Secondary | ICD-10-CM | POA: Diagnosis not present

## 2023-04-08 DIAGNOSIS — W1839XD Other fall on same level, subsequent encounter: Secondary | ICD-10-CM | POA: Diagnosis not present

## 2023-04-08 DIAGNOSIS — Z7982 Long term (current) use of aspirin: Secondary | ICD-10-CM | POA: Diagnosis not present

## 2023-04-08 DIAGNOSIS — F411 Generalized anxiety disorder: Secondary | ICD-10-CM | POA: Diagnosis not present

## 2023-04-08 DIAGNOSIS — M810 Age-related osteoporosis without current pathological fracture: Secondary | ICD-10-CM | POA: Diagnosis not present

## 2023-04-08 DIAGNOSIS — D62 Acute posthemorrhagic anemia: Secondary | ICD-10-CM | POA: Diagnosis not present

## 2023-04-08 MED ORDER — OXYCODONE HCL 5 MG PO TABS: 5.0000 mg | ORAL_TABLET | ORAL | 0 refills | Status: AC | PRN

## 2023-04-09 DIAGNOSIS — Z0000 Encounter for general adult medical examination without abnormal findings: Secondary | ICD-10-CM | POA: Diagnosis not present

## 2023-04-09 DIAGNOSIS — K582 Mixed irritable bowel syndrome: Secondary | ICD-10-CM | POA: Diagnosis not present

## 2023-04-09 DIAGNOSIS — I10 Essential (primary) hypertension: Secondary | ICD-10-CM | POA: Diagnosis not present

## 2023-04-09 DIAGNOSIS — F5102 Adjustment insomnia: Secondary | ICD-10-CM | POA: Diagnosis not present

## 2023-04-09 DIAGNOSIS — M7662 Achilles tendinitis, left leg: Secondary | ICD-10-CM | POA: Diagnosis not present

## 2023-04-09 DIAGNOSIS — D649 Anemia, unspecified: Secondary | ICD-10-CM | POA: Diagnosis not present

## 2023-04-09 DIAGNOSIS — S72002S Fracture of unspecified part of neck of left femur, sequela: Secondary | ICD-10-CM | POA: Diagnosis not present

## 2023-04-09 DIAGNOSIS — M818 Other osteoporosis without current pathological fracture: Secondary | ICD-10-CM | POA: Diagnosis not present

## 2023-04-09 DIAGNOSIS — M797 Fibromyalgia: Secondary | ICD-10-CM | POA: Diagnosis not present

## 2023-04-09 DIAGNOSIS — D62 Acute posthemorrhagic anemia: Secondary | ICD-10-CM | POA: Diagnosis not present

## 2023-04-09 DIAGNOSIS — E7800 Pure hypercholesterolemia, unspecified: Secondary | ICD-10-CM | POA: Diagnosis not present

## 2023-04-09 DIAGNOSIS — F411 Generalized anxiety disorder: Secondary | ICD-10-CM | POA: Diagnosis not present

## 2023-04-21 ENCOUNTER — Encounter: Payer: Self-pay | Admitting: Orthopedic Surgery

## 2023-04-27 ENCOUNTER — Emergency Department
Admission: EM | Admit: 2023-04-27 | Discharge: 2023-04-27 | Disposition: A | Discharge status: Home / Self Care | Payer: No Typology Code available for payment source | Attending: Emergency Medicine | Admitting: Emergency Medicine

## 2023-04-27 ENCOUNTER — Other Ambulatory Visit: Payer: Self-pay

## 2023-04-27 ENCOUNTER — Emergency Department: Payer: No Typology Code available for payment source

## 2023-04-27 ENCOUNTER — Encounter: Payer: Self-pay | Admitting: Radiology

## 2023-04-27 DIAGNOSIS — N201 Calculus of ureter: Secondary | ICD-10-CM | POA: Diagnosis not present

## 2023-04-27 DIAGNOSIS — D72829 Elevated white blood cell count, unspecified: Secondary | ICD-10-CM | POA: Insufficient documentation

## 2023-04-27 DIAGNOSIS — N200 Calculus of kidney: Secondary | ICD-10-CM | POA: Diagnosis not present

## 2023-04-27 DIAGNOSIS — R1032 Left lower quadrant pain: Secondary | ICD-10-CM | POA: Diagnosis not present

## 2023-04-27 DIAGNOSIS — R6883 Chills (without fever): Secondary | ICD-10-CM | POA: Diagnosis not present

## 2023-04-27 LAB — CBC
HCT: 44.7 % (ref 36.0–46.0)
Hemoglobin: 14 g/dL (ref 12.0–15.0)
MCH: 26.5 pg (ref 26.0–34.0)
MCHC: 31.3 g/dL (ref 30.0–36.0)
MCV: 84.5 fL (ref 80.0–100.0)
Platelets: 396 10*3/uL (ref 150–400)
RBC: 5.29 MIL/uL — ABNORMAL HIGH (ref 3.87–5.11)
RDW: 13.6 % (ref 11.5–15.5)
WBC: 14.7 10*3/uL — ABNORMAL HIGH (ref 4.0–10.5)
nRBC: 0 % (ref 0.0–0.2)

## 2023-04-27 LAB — URINALYSIS, ROUTINE W REFLEX MICROSCOPIC
Bilirubin Urine: NEGATIVE
Glucose, UA: NEGATIVE mg/dL
Ketones, ur: 5 mg/dL — AB
Leukocytes,Ua: NEGATIVE
Nitrite: NEGATIVE
Protein, ur: NEGATIVE mg/dL
Specific Gravity, Urine: 1.003 — ABNORMAL LOW (ref 1.005–1.030)
Squamous Epithelial / HPF: NONE SEEN /HPF (ref 0–5)
pH: 7 (ref 5.0–8.0)

## 2023-04-27 LAB — COMPREHENSIVE METABOLIC PANEL
ALT: 22 U/L (ref 0–44)
AST: 25 U/L (ref 15–41)
Albumin: 4.9 g/dL (ref 3.5–5.0)
Alkaline Phosphatase: 51 U/L (ref 38–126)
Anion gap: 13 (ref 5–15)
BUN: 19 mg/dL (ref 8–23)
CO2: 23 mmol/L (ref 22–32)
Calcium: 10.4 mg/dL — ABNORMAL HIGH (ref 8.9–10.3)
Chloride: 101 mmol/L (ref 98–111)
Creatinine, Ser: 0.86 mg/dL (ref 0.44–1.00)
GFR, Estimated: >60 mL/min (ref >60)
Glucose, Bld: 136 mg/dL — ABNORMAL HIGH (ref 70–99)
Potassium: 2.9 mmol/L — ABNORMAL LOW (ref 3.5–5.1)
Sodium: 137 mmol/L (ref 135–145)
Total Bilirubin: 0.7 mg/dL (ref 0.3–1.2)
Total Protein: 9.1 g/dL — ABNORMAL HIGH (ref 6.5–8.1)

## 2023-04-27 LAB — LIPASE, BLOOD: Lipase: 30 U/L (ref 11–51)

## 2023-04-27 MED ORDER — KETOROLAC TROMETHAMINE 10 MG PO TABS: 10.0000 mg | ORAL_TABLET | Freq: Three times a day (TID) | ORAL | 0 refills | Status: DC | PRN

## 2023-04-27 MED ORDER — ONDANSETRON HCL 4 MG/2ML IJ SOLN
4.0000 mg | Freq: Once | INTRAMUSCULAR | Status: AC | PRN
  Administered 2023-04-27: 4 mg via INTRAVENOUS
  Filled 2023-04-27: qty 2

## 2023-04-27 MED ORDER — ONDANSETRON HCL 4 MG/2ML IJ SOLN
4.0000 mg | Freq: Once | INTRAMUSCULAR | Status: AC
  Administered 2023-04-27: 4 mg via INTRAVENOUS
  Filled 2023-04-27: qty 2

## 2023-04-27 MED ORDER — SODIUM CHLORIDE 0.9 % IV BOLUS
1000.0000 mL | Freq: Once | INTRAVENOUS | Status: AC
  Administered 2023-04-27: 1000 mL via INTRAVENOUS

## 2023-04-27 MED ORDER — IOHEXOL 300 MG/ML  SOLN
100.0000 mL | Freq: Once | INTRAMUSCULAR | Status: AC | PRN
  Administered 2023-04-27: 100 mL via INTRAVENOUS

## 2023-04-27 MED ORDER — SUCRALFATE 1 G PO TABS: 1.0000 g | ORAL_TABLET | Freq: Four times a day (QID) | ORAL | 0 refills | Status: DC

## 2023-04-27 MED ORDER — SODIUM CHLORIDE 0.9 % IV SOLN: Freq: Once | INTRAVENOUS | Status: DC

## 2023-04-27 MED ORDER — TAMSULOSIN HCL 0.4 MG PO CAPS: 0.4000 mg | ORAL_CAPSULE | Freq: Every day | ORAL | 0 refills | Status: DC

## 2023-04-27 MED ORDER — SODIUM CHLORIDE 0.9 % IV SOLN: Freq: Once | INTRAVENOUS | Status: AC

## 2023-04-27 MED ORDER — FENTANYL CITRATE PF 50 MCG/ML IJ SOSY
50.0000 ug | PREFILLED_SYRINGE | Freq: Once | INTRAMUSCULAR | Status: AC
  Administered 2023-04-27: 50 ug via INTRAVENOUS
  Filled 2023-04-27: qty 1

## 2023-04-27 MED ORDER — KETOROLAC TROMETHAMINE 30 MG/ML IJ SOLN
30.0000 mg | Freq: Once | INTRAMUSCULAR | Status: AC
  Administered 2023-04-27: 30 mg via INTRAVENOUS
  Filled 2023-04-27: qty 1

## 2023-04-27 MED ORDER — OXYCODONE-ACETAMINOPHEN 5-325 MG PO TABS: 1.0000 | ORAL_TABLET | ORAL | 0 refills | Status: DC | PRN

## 2023-04-27 MED ORDER — ONDANSETRON HCL 4 MG PO TABS: 4.0000 mg | ORAL_TABLET | Freq: Three times a day (TID) | ORAL | 0 refills | Status: DC | PRN

## 2023-04-27 MED ORDER — POTASSIUM CHLORIDE CRYS ER 20 MEQ PO TBCR
40.0000 meq | EXTENDED_RELEASE_TABLET | Freq: Once | ORAL | Status: AC
  Administered 2023-04-27: 20 meq via ORAL
  Filled 2023-04-27: qty 2

## 2023-04-27 NOTE — ED Notes (Signed)
Secure chatted EDP Teresa [name] about L lower abd pain and vomiting to see if he would like any specific imaging ordered. No new orders.

## 2023-04-27 NOTE — ED Provider Notes (Signed)
[facility] Provider Note    Event Date/Time   First MD Initiated Contact with Patient 04/27/23 1104     (approximate)   History   Emesis   HPI  Teresa [name] is a 67 y.o. female who presents to the emergency department today because of concerns for nausea vomiting and abdominal pain.  The symptoms started 3 days ago.  She is having most of her pain in the left lower quadrant.  She has had numerous episodes of nonbloody vomiting.  She states the pain also radiates to her back.  She denies history of diverticulitis.  No unusual ingestions or sick contacts prior to symptoms starting.     Physical Exam   Triage Vital Signs: ED Triage Vitals  Enc Vitals Group     BP 04/27/23 0949 (!) 141/82     Pulse Rate 04/27/23 0949 (!) 103     Resp 04/27/23 0949 18     Temp 04/27/23 0949 98 F (36.7 C)     Temp Source 04/27/23 0949 Oral     SpO2 04/27/23 0949 100 %     Weight 04/27/23 0952 130 lb (59 kg)     Height 04/27/23 0952 5\' 7"  (1.702 m)     Head Circumference --      Peak Flow --      Pain Score 04/27/23 0951 9     Pain Loc --      Pain Edu? --      Excl. in GC? --     Most recent vital signs: Vitals:   04/27/23 0949  BP: (!) 141/82  Pulse: (!) 103  Resp: 18  Temp: 98 F (36.7 C)  SpO2: 100%   General: Awake, alert, oriented. CV:  Good peripheral perfusion. Tachycardia. Resp:  Normal effort.  Abd:  No distention. Tender to palpation in the left lower quadrant.   ED Results / Procedures / Treatments   Labs (all labs ordered are listed, but only abnormal results are displayed) Labs Reviewed  COMPREHENSIVE METABOLIC PANEL - Abnormal; Notable for the following components:      Result Value   Potassium 2.9 (*)    Glucose, Bld 136 (*)    Calcium 10.4 (*)    Total Protein 9.1 (*)    All other components within normal limits  CBC - Abnormal; Notable for the following components:   WBC 14.7 (*)    RBC 5.29 (*)    All other components  within normal limits  URINALYSIS, ROUTINE W REFLEX MICROSCOPIC - Abnormal; Notable for the following components:   Color, Urine STRAW (*)    APPearance CLEAR (*)    Specific Gravity, Urine 1.003 (*)    Hgb urine dipstick LARGE (*)    Ketones, ur 5 (*)    Bacteria, UA RARE (*)    All other components within normal limits  LIPASE, BLOOD     EKG  None   RADIOLOGY I independently interpreted and visualized the ct abd/pel. My interpretation: No free air Radiology interpretation:  IMPRESSION:  1. Nonobstructing 2 mm distal right ureteral stone. No  hydronephrosis.  2.  Aortic Atherosclerosis (ICD10-I70.0).      PROCEDURES:  Critical Care performed: No   MEDICATIONS ORDERED IN ED: Medications  ondansetron (ZOFRAN) injection 4 mg (4 mg Intravenous Given 04/27/23 1013)  sodium chloride 0.9 % bolus 1,000 mL (1,000 mLs Intravenous New Bag/Given 04/27/23 1007)     IMPRESSION / MDM / ASSESSMENT AND PLAN / ED  COURSE  I reviewed the triage vital signs and the nursing notes.                              Differential diagnosis includes, but is not limited to, diverticulitis, gastroenteritis, colitis, kidney stone, uti  Patient's presentation is most consistent with acute presentation with potential threat to life or bodily function.   Patient presented to the emergency department today because of concerns for nausea vomiting abdominal and back pain.  On exam patient does appear uncomfortable.  Abdomen is tender to palpation primarily left lower quadrant.  Blood work shows a slight leukocytosis.  CT abdomen pelvis was obtained.  This was consistent with a kidney stone.  I do think this could explain the patient's symptoms of abdominal pain back pain and nausea and vomiting.  Discussed this with the patient.  Did give patient pain medication here in the emergency department.  She did request discharge home which I think is reasonable at this time.  I have very low concern for infected  stone.  Will plan on discharging with pain medication, nausea medication and Flomax.  Will give patient GI follow-up information.   FINAL CLINICAL IMPRESSION(S) / ED DIAGNOSES   Final diagnoses:  Kidney stone     Note:  This document was prepared using Dragon voice recognition software and may include unintentional dictation errors.    Phineas Semen, MD 04/27/23 1450

## 2023-04-27 NOTE — ED Notes (Signed)
Pt assisted to switch from wheelchair to subwait recliner; pt actively vomiting/dry heaving. Visitor with pt.

## 2023-04-27 NOTE — Discharge Instructions (Signed)
Please seek medical attention for any high fevers, chest pain, shortness of breath, change in behavior, persistent vomiting, bloody stool or any other new or concerning symptoms.  

## 2023-04-27 NOTE — ED Triage Notes (Addendum)
Pt reports vomiting since Friday; chills; back pain lower; shoulder pain bilaterally; pt states "I broke my fever in April"; femur surgery in April at [facility] per pt. Pt is shivering with warm blanket around her. C/o L lower abd pain. Gallbladder removed 2 years ago.

## 2023-05-05 DIAGNOSIS — R5381 Other malaise: Secondary | ICD-10-CM | POA: Diagnosis not present

## 2023-05-05 DIAGNOSIS — S72342S Displaced spiral fracture of shaft of left femur, sequela: Secondary | ICD-10-CM | POA: Diagnosis not present

## 2023-05-05 DIAGNOSIS — R5383 Other fatigue: Secondary | ICD-10-CM | POA: Diagnosis not present

## 2023-05-05 DIAGNOSIS — F411 Generalized anxiety disorder: Secondary | ICD-10-CM | POA: Diagnosis not present

## 2023-05-05 DIAGNOSIS — Z79899 Other long term (current) drug therapy: Secondary | ICD-10-CM | POA: Diagnosis not present

## 2023-05-05 DIAGNOSIS — N201 Calculus of ureter: Secondary | ICD-10-CM | POA: Diagnosis not present

## 2023-05-05 DIAGNOSIS — G8929 Other chronic pain: Secondary | ICD-10-CM | POA: Diagnosis not present

## 2023-05-05 DIAGNOSIS — E876 Hypokalemia: Secondary | ICD-10-CM | POA: Diagnosis not present

## 2023-05-05 DIAGNOSIS — M545 Low back pain, unspecified: Secondary | ICD-10-CM | POA: Diagnosis not present

## 2023-05-05 DIAGNOSIS — M818 Other osteoporosis without current pathological fracture: Secondary | ICD-10-CM | POA: Diagnosis not present

## 2023-05-09 ENCOUNTER — Other Ambulatory Visit: Payer: Self-pay | Admitting: Orthopedic Surgery

## 2023-05-14 ENCOUNTER — Ambulatory Visit: Payer: No Typology Code available for payment source | Admitting: Orthopedic Surgery

## 2023-05-14 ENCOUNTER — Other Ambulatory Visit (INDEPENDENT_AMBULATORY_CARE_PROVIDER_SITE_OTHER): Payer: No Typology Code available for payment source

## 2023-05-14 ENCOUNTER — Other Ambulatory Visit: Payer: Self-pay

## 2023-05-14 DIAGNOSIS — S72342A Displaced spiral fracture of shaft of left femur, initial encounter for closed fracture: Secondary | ICD-10-CM

## 2023-05-14 NOTE — Progress Notes (Signed)
Orthopedic Surgery Post-operative Office Visit   Procedure: Left femur intramedullary rodding Date of Surgery: 02/18/2023 (~3 months post-op)   Assessment: Patient is a 67 y.o. who has had worsening of her left thigh pain since her kidney stones and H. pylori infection with 20 pound weight loss and vomiting   Plan: -Operative plans complete -Okay to submerge wounds at this time -Talked about boost and other supplements to help her regain some of the weight that was lost -I told her that I felt she was making progress in terms of healing of her fracture.  However, with her recent illness it seems she has had a setback.  I told her I want to see her back sooner than my usual follow-up in order to keep an eye on it and make sure that she her pain gets better with time -For the swelling around her Achilles, I told her to use Voltaren gel and work on Achilles tendon stretching -Pain management: OTC medications -Return to office in 6 weeks, x-rays needed at next visit: AP/lateral left femur   ___________________________________________________________________________     Subjective: Patient has been doing well since she was last seen in the office.  She says she had weaned herself down to a cane and physical therapy was pleased with her progress.  She is not having any pain walking around.  However, then she became ill near the end of June.  She was nauseated and vomiting.  She said she vomited for 3 days in a row and could not take down any food.  Since that time, she has lost 20 pounds.  She was only roughly 130 pounds to start but is now down to 110.  She said she was diagnosed with kidney stones and an infection of her stomach.  She says she has been placed on 3 antibiotics to treat the bacterial infection of her stomach.  She said around the time of this illness, she developed pain in her left proximal thigh..  She has been taking over-the-counter medications to help with the pain.  She has also  noticed swelling around her Achilles tendon and feels like it snapping.  Is also started on the time of the illness.  She has not noticed any other swelling around her leg or foot.   Objective:   General: no acute distress, appropriate affect, ambulating with walker Neurologic: alert, answering questions appropriately, following commands Respiratory: unlabored breathing on room air Skin: incisions are well healed with no erythema, induration, active/expressible drainage   MSK (LLE): EHL/TA/GSC intact Plantarflexes and dorsiflexes toes Sensation intact to light touch in sural, saphenous, tibial, deep peroneal, and superficial peroneal nerve distributions Foot warm and well perfused, palpable DP pulse Negative Homans, no tenderness to palpation over the calf, minimal swelling seen around the Achilles, no other swelling seen.  Tender to palpation over the Achilles tendon, no other tenderness to palpation over the foot and ankle   Imaging: X-rays of the left femur taken 05/14/2023 were independently reviewed and interpreted, showing intramedullary rod in place. There is a subtle lucency around the most distal two interlocking screws. No lucency seen around the most proximal interlocking screw. No lucency seen around the screws in the femoral head. Fracture alignment has bene maintained. Small amount of callus seen over the anterior cortex. No other significant callus formation seen.     Patient name: Teresa [name] Patient MRN: [id number] Date of visit: 05/14/23

## 2023-06-02 DIAGNOSIS — E876 Hypokalemia: Secondary | ICD-10-CM | POA: Diagnosis not present

## 2023-06-14 ENCOUNTER — Encounter: Payer: Self-pay | Admitting: Orthopedic Surgery

## 2023-06-16 NOTE — Telephone Encounter (Signed)
Patient coming on 06/17/23

## 2023-06-17 ENCOUNTER — Ambulatory Visit (INDEPENDENT_AMBULATORY_CARE_PROVIDER_SITE_OTHER): Payer: No Typology Code available for payment source | Admitting: Orthopedic Surgery

## 2023-06-17 ENCOUNTER — Other Ambulatory Visit (INDEPENDENT_AMBULATORY_CARE_PROVIDER_SITE_OTHER): Payer: No Typology Code available for payment source

## 2023-06-17 DIAGNOSIS — S72342A Displaced spiral fracture of shaft of left femur, initial encounter for closed fracture: Secondary | ICD-10-CM

## 2023-06-17 NOTE — Progress Notes (Signed)
Orthopedic Surgery Post-operative Office Visit   Procedure: Left femur intramedullary rodding Date of Surgery: 02/18/2023 (~4 months post-op)   Assessment: Patient is a 67 y.o. who is still having pain around her midshaft femur on the left side.  Also, reporting pain around the gluteus tendon insertion on the right hip   Plan: -Operative plans complete -Okay to submerge wounds at this time -For her right hip, she has already been prescribed a muscle relaxer by another provider.  I told her to continue to use that.  If she is not doing any better at her next visit, we will order an MRI of the hip to evaluate further -For her left femur fracture, she has noted improvement in her pain since last time I saw her so we will continue to monitor.  I told her if her pain does not get better eventually we would get inflammatory markers and a CT scan to workup a nonunion -Pain management: OTC medications -Return to office in 6 weeks, x-rays needed at next visit: AP/lateral left femur   ___________________________________________________________________________     Subjective: Patient has been doing better since the last time I saw her.  She says that her pain has gotten slightly better in the left thigh.  She still has pain in the area of the midshaft of the femur on the left side.  She notes it is worse with weightbearing and improves with rest.  She has not noticed any pain rating past the knee.  Denies paresthesias and numbness.  She has not noticed any redness or drainage around her incisions.  She has transition to using a cane outside of the house.  She is not using any ambulatory assistive devices around the house.  She also wanted talk about her right hip.  She has had pain for the last couple of weeks in the right hip.  She feels it along the lateral aspect of the hip just proximal to the greater trochanter.  She notes it is worse if she lays on that side was walking.  There was no trauma or injury  that preceded the onset of pain.   Objective:   General: no acute distress, appropriate affect, ambulating with cane Neurologic: alert, answering questions appropriately, following commands Respiratory: unlabored breathing on room air Skin: incisions are well healed   MSK (LLE): EHL/TA/GSC intact Plantarflexes and dorsiflexes toes Sensation intact to light touch in sural, saphenous, tibial, deep peroneal, and superficial peroneal nerve distributions Foot warm and well perfused, palpable DP pulse  MSK (RLE):  Able to abduct the hip against gravity  TTP just proximal to the greater trochanter, no tenderness palpation over the greater trochanter itself  No pain with logroll, negative Stinchfield, negative FABER  EHL/TA/GSC intact  Plantar flexes and dorsiflexes toes  Sensation intact to light touch in sural, saphenous, tibial, deep peroneal, superficial peroneal nerve distributions  Foot warm and well-perfused   Imaging: X-rays of the left femur taken 06/17/2023 were independently reviewed and interpreted, showing intramedullary rod in place. There is a subtle lucency around the most distal two interlocking screws which is unchanged from her films on 05/14/2023. No lucency seen around the most proximal interlocking screw. No lucency seen around the screws in the femoral head. The fracture line is less distinct on the AP view but is still visible. Slightly more calcium deposit seen over the medial aspect of the femur near the fracture site compared to 05/14/2023. No new fracture seen. Fracture alignment has been maintained.  Patient name: Teresa [name] Patient MRN: [id number] Date of visit: 06/17/23

## 2023-06-19 ENCOUNTER — Other Ambulatory Visit: Payer: Self-pay | Admitting: Student in an Organized Health Care Education/Training Program

## 2023-06-25 ENCOUNTER — Ambulatory Visit: Payer: No Typology Code available for payment source | Admitting: Orthopedic Surgery

## 2023-07-07 ENCOUNTER — Encounter: Payer: Self-pay | Admitting: Orthopedic Surgery

## 2023-07-10 DIAGNOSIS — M7061 Trochanteric bursitis, right hip: Secondary | ICD-10-CM | POA: Diagnosis not present

## 2023-07-30 ENCOUNTER — Other Ambulatory Visit (INDEPENDENT_AMBULATORY_CARE_PROVIDER_SITE_OTHER): Payer: No Typology Code available for payment source

## 2023-07-30 ENCOUNTER — Ambulatory Visit: Payer: No Typology Code available for payment source | Admitting: Orthopedic Surgery

## 2023-07-30 DIAGNOSIS — S72342A Displaced spiral fracture of shaft of left femur, initial encounter for closed fracture: Secondary | ICD-10-CM

## 2023-07-30 MED ORDER — NAPROXEN 500 MG PO TABS: 500.0000 mg | ORAL_TABLET | Freq: Two times a day (BID) | ORAL | 1 refills | Status: AC

## 2023-07-30 NOTE — Progress Notes (Signed)
Orthopedic Surgery Post-operative Office Visit   Procedure: Left femur intramedullary rodding Date of Surgery: 02/18/2023 (~5 months post-op)   Assessment: Patient is a 67 y.o. who is still having pain around her midshaft femur on the left side. Pain is slightly improved since our last visit   Plan: -Operative plans complete -Okay to submerge wounds at this time -Right hip is not as symptomatic so will hold off on advanced imaging at this time -I talked about starting workup for nonunion. She wanted to try naprosyn at this since that has helped with her pain in the past. Naprosyn was prescribed to her today. If her pain persists, will eventually we would get inflammatory markers and a CT scan to workup a nonunion -Ordered PT to help with her gait -For the cold sensation in her left leg, ordered ABIs to evaluate further -Return to office in 8 weeks, x-rays needed at next visit: AP/lateral left femur   ___________________________________________________________________________     Subjective: Patient states has been doing slightly better since the last time she was seen. Still having pain in her left buttock around the incision for the nail insertion and near the midshaft of the femur. She states her pain is worse in the evening after being active. It starts off at a 3/10 in the morning but by the evening can be a 7 or 8 out of 10. She has previously used naprosyn for pain and wanted to try that again. She comes in today without any assistive devices but states she is still using her cane a lot. Has noticed a cool feeling to her left leg. She feels it around the midshaft tibia to the foot. Usually feels this as it gets into the afternoon and evening hours.     Objective:   General: no acute distress, appropriate affect, ambulating without assistive devices Neurologic: alert, answering questions appropriately, following commands Respiratory: unlabored breathing on room air Skin: incisions are  well healed   MSK (LLE): EHL/TA/GSC intact Plantarflexes and dorsiflexes toes Sensation intact to light touch in sural, saphenous, tibial, deep peroneal, and superficial peroneal nerve distributions Foot warm and well perfused, palpable DP pulse  Imaging: X-rays of the left femur taken 07/30/2023 were independently reviewed and interpreted, showing intramedullary rod in place. There is still a subtle lucency around the most distal two interlocking screws. No lucency seen around the more proximal interlocking screw. No lucency seen around the screws in the femoral head. The medial aspect of the fracture on the AP view now has callus formation and the fracture line is no longer visible. The lateral fracture line is still visible but the gap is not as large as on her prior films from 06/17/2023. Fracture lines are still visible but re not as distinct on the lateral view. Minimal callus formation seen anteriorly. Fracture alignment has been maintained.      Patient name: Teresa [name] Patient MRN: [id number] Date of visit: 07/30/23

## 2023-09-14 ENCOUNTER — Encounter: Payer: Self-pay | Admitting: Orthopedic Surgery

## 2023-09-14 ENCOUNTER — Encounter (INDEPENDENT_AMBULATORY_CARE_PROVIDER_SITE_OTHER): Payer: No Typology Code available for payment source

## 2023-09-15 ENCOUNTER — Other Ambulatory Visit: Payer: Self-pay | Admitting: Orthopedic Surgery

## 2023-09-18 ENCOUNTER — Other Ambulatory Visit (INDEPENDENT_AMBULATORY_CARE_PROVIDER_SITE_OTHER): Payer: No Typology Code available for payment source

## 2023-09-18 ENCOUNTER — Ambulatory Visit (INDEPENDENT_AMBULATORY_CARE_PROVIDER_SITE_OTHER): Payer: No Typology Code available for payment source | Admitting: Orthopedic Surgery

## 2023-09-18 DIAGNOSIS — M7062 Trochanteric bursitis, left hip: Secondary | ICD-10-CM

## 2023-09-18 DIAGNOSIS — S72342A Displaced spiral fracture of shaft of left femur, initial encounter for closed fracture: Secondary | ICD-10-CM | POA: Diagnosis not present

## 2023-09-18 NOTE — Progress Notes (Addendum)
Orthopedic Surgery Post-operative Office Visit   Procedure: Left femur intramedullary rodding Date of Surgery: 02/18/2023 (~7 months post-op)   Assessment: Patient is a 67 y.o. who is no longer having left thigh pain but has some pain over the greater trochanter, possibly from irritation from the lag screws   Plan: -Operative plans complete -Pain in her thigh has improved significantly and she has demonstrated interval radiographic healing today, so will hold off on nonunion work up -She is having more pain over her left greater trochanter. I talked about injection as a treatment option which she wanted to try so that was done today in the office -Talked about possibly removing the lag screws if she remains persistently symptomatic over the greater trochanter -Return to office in 8 weeks, x-rays needed at next visit: AP/lateral left femur   Left greater trochanter injection note: After discussing the risk, benefits, alternatives of left greater trochanter bursa injection, patient elected to proceed.  The patient was in the lateral decubitus position with the left hip up.  The skin over the greater trochanter was prepped with an alcohol based prep.  The skin was anesthetized with ethyl chloride.  A 20-gauge needle was used to inject 1 cc of bupivacaine, 1 cc of lidocaine, 1 cc of Depo-Medrol under standard sterile technique.  Needle was withdrawn.  Band-Aid was applied.  Patient tolerated procedure well.  ___________________________________________________________________________     Subjective: Patient has been doing better since she was last seen.  She is no longer having any thigh pain on the left side.  She has developed more pain over the lateral aspect of the left hip.  She notes it is worse when she is going up and down stairs or laying on that side.  She has not noticed any redness or drainage around her incisions.  She is ambulating without assistive devices.     Objective:   General:  no acute distress, appropriate affect Neurologic: alert, answering questions appropriately, following commands Respiratory: unlabored breathing on room air Skin: incisions are well healed   MSK (LLE):  TTP over the greater trochanter, no other tenderness palpation  No pain with logroll EHL/TA/GSC intact Plantarflexes and dorsiflexes toes Sensation intact to light touch in sural, saphenous, tibial, deep peroneal, and superficial peroneal nerve distributions Foot warm and well perfused, palpable DP pulse   Imaging: X-rays of the left femur taken 09/18/2023 were independently reviewed and interpreted, showing intramedullary rod in place.  There are lag screws within the femoral head with no lucency around them.  They have appear well centered within the femoral head especially on the lateral.  No screw cut out as demonstrated on today's films.  Subtle lucency still seen around the distal two most interlocking screws.  No lucency seen on the most cranial of the interlocking screws.  There is been interval fracture healing noted particularly posteriorly and medially.  There is still a persistent fracture line seen over the lateral cortex.  Fracture alignment has been maintained.     Patient name: CALISTA ARMWOOD Patient MRN: [id number] Date of visit: 09/18/23

## 2023-09-21 ENCOUNTER — Encounter (INDEPENDENT_AMBULATORY_CARE_PROVIDER_SITE_OTHER): Payer: No Typology Code available for payment source

## 2023-10-08 ENCOUNTER — Ambulatory Visit: Payer: No Typology Code available for payment source | Admitting: Orthopedic Surgery

## 2023-10-21 DIAGNOSIS — Z008 Encounter for other general examination: Secondary | ICD-10-CM | POA: Diagnosis not present

## 2023-10-21 DIAGNOSIS — Z681 Body mass index (BMI) 19 or less, adult: Secondary | ICD-10-CM | POA: Diagnosis not present

## 2023-10-21 DIAGNOSIS — K219 Gastro-esophageal reflux disease without esophagitis: Secondary | ICD-10-CM | POA: Diagnosis not present

## 2023-10-21 DIAGNOSIS — I700 Atherosclerosis of aorta: Secondary | ICD-10-CM | POA: Diagnosis not present

## 2023-10-21 DIAGNOSIS — G43909 Migraine, unspecified, not intractable, without status migrainosus: Secondary | ICD-10-CM | POA: Diagnosis not present

## 2023-10-21 DIAGNOSIS — R2681 Unsteadiness on feet: Secondary | ICD-10-CM | POA: Diagnosis not present

## 2023-10-21 DIAGNOSIS — M810 Age-related osteoporosis without current pathological fracture: Secondary | ICD-10-CM | POA: Diagnosis not present

## 2023-10-21 DIAGNOSIS — G4700 Insomnia, unspecified: Secondary | ICD-10-CM | POA: Diagnosis not present

## 2023-10-21 DIAGNOSIS — M545 Low back pain, unspecified: Secondary | ICD-10-CM | POA: Diagnosis not present

## 2023-10-21 DIAGNOSIS — E785 Hyperlipidemia, unspecified: Secondary | ICD-10-CM | POA: Diagnosis not present

## 2023-12-07 ENCOUNTER — Encounter: Payer: Self-pay | Admitting: Orthopedic Surgery

## 2023-12-23 ENCOUNTER — Ambulatory Visit: Payer: No Typology Code available for payment source | Admitting: Orthopedic Surgery

## 2024-01-06 ENCOUNTER — Encounter: Payer: Self-pay | Admitting: Orthopedic Surgery

## 2024-01-13 ENCOUNTER — Other Ambulatory Visit (INDEPENDENT_AMBULATORY_CARE_PROVIDER_SITE_OTHER): Payer: Self-pay

## 2024-01-13 ENCOUNTER — Ambulatory Visit (INDEPENDENT_AMBULATORY_CARE_PROVIDER_SITE_OTHER): Admitting: Orthopedic Surgery

## 2024-01-13 DIAGNOSIS — M898X5 Other specified disorders of bone, thigh: Secondary | ICD-10-CM

## 2024-01-13 DIAGNOSIS — S72342A Displaced spiral fracture of shaft of left femur, initial encounter for closed fracture: Secondary | ICD-10-CM

## 2024-01-13 MED ORDER — TIZANIDINE HCL 4 MG PO TABS: 4.0000 mg | ORAL_TABLET | Freq: Four times a day (QID) | ORAL | 2 refills | Status: DC | PRN

## 2024-01-13 MED ORDER — OXYCODONE-ACETAMINOPHEN 5-325 MG PO TABS: 1.0000 | ORAL_TABLET | ORAL | 0 refills | Status: AC | PRN

## 2024-01-13 NOTE — Progress Notes (Signed)
 Orthopedic Surgery Post-operative Office Visit   Procedure: Left femur intramedullary rodding Date of Surgery: 02/18/2023 (~11 months post-op)   Assessment: Patient is a 68 y.o. who has right posterior hip pain that seems consistent with gluteus tendon pathology.  Also reporting left lateral hip pain in the area of the greater trochanter consistent with trochanteric bursitis   Plan: -Operative plans complete -Recommended desensitization of the left trochanteric bursa regions and she is not interested in repeat injection.  Talked about possibly removing the screws in the future to help with her symptoms as well.  She does not want undergo surgery again -Since she is unable to get into her pain management doctor for 3 months, provided her with a one-time prescription of Percocet.  Also prescribed tizanidine -Discussed getting an MRI of her hip if her symptoms persist to look for the gluteus tendon pathology.  Told her in the interim to work on strengthening of the gluteus muscles -Return to office in 6 months, x-rays needed at next visit: AP/lateral right femur   ___________________________________________________________________________     Subjective: Patient comes in today with left hip and right hip pain.  She states she is no longer having left thigh pain.  She does have some mild right thigh pain.  She feels the pain just posterior and superior to the trochanter on the right side.  On the left side, shooting pain over the lateral aspect of the trochanter.  She notes both areas are tender to the touch.  She has difficulty sleeping on her side as a result of these pains.  She is ambulating without assistive devices.     Objective:   General: no acute distress, appropriate affect, ambulating without assistive devices Neurologic: alert, answering questions appropriately, following commands Respiratory: unlabored breathing on room air Skin: incisions are well healed   MSK (LLE):              TTP over the greater trochanter, no other tenderness palpation             No pain with logroll EHL/TA/GSC intact Plantarflexes and dorsiflexes toes Sensation intact to light touch in sural, saphenous, tibial, deep peroneal, and superficial peroneal nerve distributions Foot warm and well perfused, palpable DP pulse  MSK (RLE):   TTP just posterior and superior to the greater trochanter, no other tenderness palpation  Negative logroll  EHL/TA/GSC intact  Plantar flexes and dorsiflexes toes  Sensation intact to light touch in sural/saphenous/deep peroneal/tibial/superficial peroneal nerve distributions  Foot warm and well-perfused, palpable DP pulse   Imaging: XRs of the left femur from 01/13/2024 were independently reviewed and interpreted, showing subtle persistent fracture line laterally on the midshaft of the femur. Medially and posteriorly the fracture is no longer visible. No lucency seen around the lag screws. Subtle lucency around the distal two interlocking screws. No lucency seem around the proximal most lag screw. No new fractures seen. Alignment maintained.   XRs of the right femur from 01/13/2024 were independently reviewed and interpreted, showing no significant degenerative changes within the hip joint. No fracture seen. No cortical beaking or thickening seen.      Patient name: Teresa [name] Patient MRN: [id number] Date of visit: 01/13/24

## 2024-02-25 ENCOUNTER — Encounter: Payer: Self-pay | Admitting: Radiology

## 2024-02-25 ENCOUNTER — Encounter: Payer: Self-pay | Admitting: Orthopedic Surgery

## 2024-03-22 ENCOUNTER — Encounter (INDEPENDENT_AMBULATORY_CARE_PROVIDER_SITE_OTHER): Payer: Self-pay

## 2024-03-22 DIAGNOSIS — Z8639 Personal history of other endocrine, nutritional and metabolic disease: Secondary | ICD-10-CM | POA: Diagnosis not present

## 2024-03-22 DIAGNOSIS — E876 Hypokalemia: Secondary | ICD-10-CM | POA: Diagnosis not present

## 2024-03-22 DIAGNOSIS — G479 Sleep disorder, unspecified: Secondary | ICD-10-CM | POA: Diagnosis not present

## 2024-03-22 DIAGNOSIS — Z0000 Encounter for general adult medical examination without abnormal findings: Secondary | ICD-10-CM | POA: Diagnosis not present

## 2024-03-22 DIAGNOSIS — E782 Mixed hyperlipidemia: Secondary | ICD-10-CM | POA: Diagnosis not present

## 2024-03-22 DIAGNOSIS — R5383 Other fatigue: Secondary | ICD-10-CM | POA: Diagnosis not present

## 2024-03-22 DIAGNOSIS — D649 Anemia, unspecified: Secondary | ICD-10-CM | POA: Diagnosis not present

## 2024-03-22 DIAGNOSIS — Z79899 Other long term (current) drug therapy: Secondary | ICD-10-CM | POA: Diagnosis not present

## 2024-03-22 DIAGNOSIS — R7309 Other abnormal glucose: Secondary | ICD-10-CM | POA: Diagnosis not present

## 2024-03-22 DIAGNOSIS — E7800 Pure hypercholesterolemia, unspecified: Secondary | ICD-10-CM | POA: Diagnosis not present

## 2024-03-22 DIAGNOSIS — R5381 Other malaise: Secondary | ICD-10-CM | POA: Diagnosis not present

## 2024-03-22 DIAGNOSIS — I10 Essential (primary) hypertension: Secondary | ICD-10-CM | POA: Diagnosis not present

## 2024-03-22 DIAGNOSIS — R0602 Shortness of breath: Secondary | ICD-10-CM | POA: Diagnosis not present

## 2024-03-24 ENCOUNTER — Other Ambulatory Visit: Payer: Self-pay | Admitting: Orthopedic Surgery

## 2024-04-05 DIAGNOSIS — M797 Fibromyalgia: Secondary | ICD-10-CM | POA: Diagnosis not present

## 2024-04-05 DIAGNOSIS — M25551 Pain in right hip: Secondary | ICD-10-CM | POA: Diagnosis not present

## 2024-04-05 DIAGNOSIS — G4700 Insomnia, unspecified: Secondary | ICD-10-CM | POA: Diagnosis not present

## 2024-04-05 DIAGNOSIS — G43709 Chronic migraine without aura, not intractable, without status migrainosus: Secondary | ICD-10-CM | POA: Diagnosis not present

## 2024-04-05 DIAGNOSIS — R7309 Other abnormal glucose: Secondary | ICD-10-CM | POA: Diagnosis not present

## 2024-04-05 DIAGNOSIS — I10 Essential (primary) hypertension: Secondary | ICD-10-CM | POA: Diagnosis not present

## 2024-04-05 DIAGNOSIS — E7800 Pure hypercholesterolemia, unspecified: Secondary | ICD-10-CM | POA: Diagnosis not present

## 2024-04-05 DIAGNOSIS — Z1331 Encounter for screening for depression: Secondary | ICD-10-CM | POA: Diagnosis not present

## 2024-04-05 DIAGNOSIS — F411 Generalized anxiety disorder: Secondary | ICD-10-CM | POA: Diagnosis not present

## 2024-04-05 DIAGNOSIS — Z1211 Encounter for screening for malignant neoplasm of colon: Secondary | ICD-10-CM | POA: Diagnosis not present

## 2024-04-05 DIAGNOSIS — Z0000 Encounter for general adult medical examination without abnormal findings: Secondary | ICD-10-CM | POA: Diagnosis not present

## 2024-04-05 DIAGNOSIS — M25552 Pain in left hip: Secondary | ICD-10-CM | POA: Diagnosis not present

## 2024-07-31 ENCOUNTER — Encounter: Payer: Self-pay | Admitting: Orthopedic Surgery

## 2024-08-03 ENCOUNTER — Other Ambulatory Visit: Payer: Self-pay

## 2024-08-03 ENCOUNTER — Ambulatory Visit (INDEPENDENT_AMBULATORY_CARE_PROVIDER_SITE_OTHER): Admitting: Orthopedic Surgery

## 2024-08-03 DIAGNOSIS — S72342A Displaced spiral fracture of shaft of left femur, initial encounter for closed fracture: Secondary | ICD-10-CM

## 2024-08-03 DIAGNOSIS — M25511 Pain in right shoulder: Secondary | ICD-10-CM

## 2024-08-03 MED ORDER — METHYLPREDNISOLONE 4 MG PO TBPK: ORAL_TABLET | ORAL | 0 refills | Status: DC

## 2024-08-03 MED ORDER — DICLOFENAC SODIUM 75 MG PO TBEC: 75.0000 mg | DELAYED_RELEASE_TABLET | Freq: Two times a day (BID) | ORAL | 2 refills | Status: AC

## 2024-08-03 NOTE — Progress Notes (Signed)
 Orthopedic Surgery Post-operative Office Visit   Procedure: Left femur intramedullary rodding Date of Surgery: 02/18/2023 (~16 months post-op)   Assessment: Patient is a 68 y.o. with two issues:  Acute onset right shoulder pain that seems consistent with partial tearing of the rotator cuff Left lateral hip pain near the lag screws which is consistent with symptomatic hardware   Plan: -Patient has now had pain over the lateral hip in the area of the lag screws on the left side for over a year without any relief with conservative treatment consisting of tylenol , injection, oxycodone , heat, diclofenac , so discussed removal of the symptomatic hardware as an option. Patient elected to proceed with this option -Prescribed more diclofenac  for her -In regards to her shoulder, I think this will get better with time since it has already gotten significantly better. Prescribed her a medrol  dose pak and gave her a home exercise (stretching and strengthening) program to work on -Patient will next be seen at the date of surgery  I discussed her left lateral hip pain seems consistent with symptomatic hardware. She has tried multiple conservative treatments without relief so discussed hardware removal as an option. I covered the risks of surgery including but not limited to: femoral neck fracture, inability to remove hardware, need for additional procedures, persistent pain, bleeding, infection, dvt/pe, and death. I said the benefit would be to remove the hardware which seems to be causing her left lateral hip pain. The alternative would be to do repeat injections, OTC medications, continue with pain management. After this conversation, I answered all of her questions to her satisfaction and she elected to proceed with surgery.    ___________________________________________________________________________     Subjective: Patient comes in today for two issues. The first is she is having continue pain over her  lateral hip. She cannot lay on that side do to the pain. She also feels the pain when ambulating. It gets better if she rests or lays down. She has no pain radiating down the extremity. It is sensitive to the touch in the area as well.   Her other issue is acute onset of right shoulder pain. There was no trauma or injury that preceded the onset of the pain. She feels it over the lateral aspect of the shoulder. It was particularly severe over the weekend but has gotten better within the last couple of days. No pain radiating further down the arm. Pain is worse with activity. Over the weekend, she said that she could not even move the arm but now she can move it but still has some difficulty with over head activity.      Objective:   General: no acute distress, appropriate affect, ambulating without assistive devices Neurologic: alert, answering questions appropriately, following commands Respiratory: unlabored breathing on room air Skin: incisions are well healed   MSK (LLE):             TTP over the lateral hip in the area of the lag screw incision, no other tenderness to palpation over the leg, negative stinchfield, no pain through hip range of motion EHL/TA/GSC intact Plantarflexes and dorsiflexes toes Sensation intact to light touch in sural, saphenous, tibial, deep peroneal, and superficial peroneal nerve distributions Foot warm and well perfused, palpable DP pulse   MSK (RUE):              Able to forward flex to 150 degrees, able to externally rotate to 90 degrees, able to internally rotate to 80 degrees, pain with external  rotation past 70 degrees, pain with jobe but no weakness, TTP over the bicipital groove, no weakness with external rotation with arm at side, negative belly press             AIN/PIN/IO intact             Flexes and extends fingers             Sensation intact to light touch in median/radial/ulnar/axillary nerve distributions             Hand warm and well-perfused,  palpable radial pulse   Imaging: XRs of the left femur from 08/03/2024 were independently reviewed and interpreted, showing callus formation around the proximal 1/3 of the femur. No fracture line seen. No lucency seen around the lag screws. Subtle lucency around the distal most lag screw and the middle lag screw. No lucency seen around the proximal most lag screw. No new fracture seen. No significant degenerative changes within the hip.   XRs of the right shoulder from 08/03/2024 were independently reviewed and interpreted, showing no significant degenerative changes within the glenohumeral joint. No fracture or dislocation seen.       Patient name: Teresa [name] Patient MRN: [id number] Date of visit: 08/03/24

## 2024-08-08 ENCOUNTER — Telehealth: Payer: Self-pay | Admitting: Orthopedic Surgery

## 2024-08-08 NOTE — Telephone Encounter (Signed)
 Patient called requesting to know how long will her expected recovery time be from hip surgery? She is trying to plan a trip. Patient states you can call her or My Chart her if you prefer.

## 2024-08-15 ENCOUNTER — Encounter (HOSPITAL_COMMUNITY): Payer: Self-pay | Admitting: Orthopedic Surgery

## 2024-08-15 ENCOUNTER — Other Ambulatory Visit: Payer: Self-pay

## 2024-08-15 NOTE — Telephone Encounter (Signed)
 I called and advise Teresa [name] of Dr. Jeraline message.

## 2024-08-15 NOTE — Progress Notes (Signed)
 SDW CALL  Patient was given pre-op instructions over the phone. The opportunity was given for the patient to ask questions. No further questions asked. Patient verbalized understanding of instructions given.   PCP - Dr. Sadie at [facility] Cardiologist - denies - did see Dr. Wolm Rhyme in 2022 but has not seen since  PPM/ICD - denies Device Orders - n/a Rep Notified -  n/a  Chest x-ray - denies EKG - DOS Stress Test - 01/14/21 ECHO - 01/03/21 Cardiac Cath - denies  Sleep Study - denies  No DM  Last dose of GLP1 agonist-  n/a GLP1 instructions: n/a  Blood Thinner Instructions: n/a Aspirin  Instructions: patient is no longer on Aspirin  - has not taken in months  ERAS Protcol - clears until 0915 PRE-SURGERY Ensure or G2-  n/a  COVID TEST- n/a   Anesthesia review: yes - per cardiologist note, patient was scheduled to have a cath in 2022 but then cardiologist also stated no further testing was needed after stress test and echo  Patient denies shortness of breath, fever, cough and chest pain over the phone call   All instructions explained to the patient, with a verbal understanding of the material. Patient agrees to go over the instructions while at home for a better understanding.

## 2024-08-15 NOTE — Anesthesia Preprocedure Evaluation (Signed)
 Anesthesia Evaluation  Patient identified by MRN, date of birth, ID band Patient awake    Reviewed: Allergy & Precautions, H&P , NPO status , Patient's Chart, lab work & pertinent test results  History of Anesthesia Complications (+) PONV and history of anesthetic complications  Airway Mallampati: II  TM Distance: >3 FB Neck ROM: Full    Dental no notable dental hx.    Pulmonary shortness of breath   Pulmonary exam normal breath sounds clear to auscultation       Cardiovascular hypertension, (-) angina Normal cardiovascular exam Rhythm:Regular Rate:Normal     Neuro/Psych  Headaches, neg Seizures PSYCHIATRIC DISORDERS  Depression     Neuromuscular disease    GI/Hepatic Neg liver ROS,GERD  ,,  Endo/Other  negative endocrine ROS    Renal/GU negative Renal ROS  negative genitourinary   Musculoskeletal  (+) Arthritis ,    Abdominal   Peds negative pediatric ROS (+)  Hematology  (+) Blood dyscrasia, anemia   Anesthesia Other Findings   Reproductive/Obstetrics negative OB ROS                              Anesthesia Physical Anesthesia Plan  ASA: 3  Anesthesia Plan: General   Post-op Pain Management: Ofirmev  IV (intra-op)*   Induction: Intravenous  PONV Risk Score and Plan: 4 or greater and TIVA  Airway Management Planned: Oral ETT  Additional Equipment: None  Intra-op Plan:   Post-operative Plan: Extubation in OR  Informed Consent: I have reviewed the patients History and Physical, chart, labs and discussed the procedure including the risks, benefits and alternatives for the proposed anesthesia with the patient or authorized representative who has indicated his/her understanding and acceptance.     Dental advisory given  Plan Discussed with: CRNA  Anesthesia Plan Comments: (Refusal of blood products. See medial tab for list of what is acceptable.   PAT note by Lynwood Hope, PA-C: 68 year old female with pertinent history including chronic migraines, GAD, GERD.  She had prior evaluation for atypical chest pain in 2022 with benign echo and stress test.  Echo 01/03/2021 showed normal biventricular function, mild mitral regurgitation, mild tricuspid regurgitation, mild pulmonic regurgitation.  Exercise nuclear stress test 01/14/2021 showed normal myocardial perfusion without evidence of myocardial ischemia.  Patient reports no symptoms of chest pain since benign workup in 2022.  Denies any cardiopulmonary symptoms.  Reports only current limitation is hip pain.  She will need day of surgery labs and evaluation.  EKG 02/18/2023: Auto read states atrial fibrillation, however, appears to be sinus rhythm with significant artifact.  Exercise nuclear stress test 01/14/2021 (Care Everywhere): Normal treadmill EKG without evidence of ischemia or arrhythmia  Normal myocardial perfusion without evidence of myocardial ischemia   TTE 01/03/2021 (Care Everywhere): INTERPRETATION  NORMAL LEFT VENTRICULAR SYSTOLIC FUNCTION  NORMAL RIGHT VENTRICULAR SYSTOLIC FUNCTION  MILD VALVULAR REGURGITATION (See above)  NO VALVULAR STENOSIS  MILD MR, TR, PR  EF 50%    )         Anesthesia Quick Evaluation

## 2024-08-15 NOTE — Progress Notes (Signed)
 Anesthesia Chart Review: Same day workup  68 year old female with pertinent history including chronic migraines, GAD, GERD.  She had prior evaluation for atypical chest pain in 2022 with benign echo and stress test.  Echo 01/03/2021 showed normal biventricular function, mild mitral regurgitation, mild tricuspid regurgitation, mild pulmonic regurgitation.  Exercise nuclear stress test 01/14/2021 showed normal myocardial perfusion without evidence of myocardial ischemia.  Patient reports no symptoms of chest pain since benign workup in 2022.  Denies any cardiopulmonary symptoms.  Reports only current limitation is hip pain.  She will need day of surgery labs and evaluation.  EKG 02/18/2023: Auto read states atrial fibrillation, however, appears to be sinus rhythm with significant artifact.  Exercise nuclear stress test 01/14/2021 (Care Everywhere): Normal treadmill EKG without evidence of ischemia or arrhythmia  Normal myocardial perfusion without evidence of myocardial ischemia   TTE 01/03/2021 (Care Everywhere): INTERPRETATION  NORMAL LEFT VENTRICULAR SYSTOLIC FUNCTION  NORMAL RIGHT VENTRICULAR SYSTOLIC FUNCTION  MILD VALVULAR REGURGITATION (See above)  NO VALVULAR STENOSIS  MILD MR, TR, PR  EF 50%      Lynwood Geofm RIGGERS [facility] Stay [facility]/Anesthesiology Phone [phone] 08/15/2024 3:18 PM

## 2024-08-16 ENCOUNTER — Encounter (HOSPITAL_COMMUNITY): Payer: Self-pay | Admitting: Orthopedic Surgery

## 2024-08-16 ENCOUNTER — Other Ambulatory Visit (HOSPITAL_COMMUNITY): Payer: Self-pay

## 2024-08-16 ENCOUNTER — Ambulatory Visit (HOSPITAL_COMMUNITY): Payer: Self-pay | Admitting: Physician Assistant

## 2024-08-16 ENCOUNTER — Ambulatory Visit (HOSPITAL_BASED_OUTPATIENT_CLINIC_OR_DEPARTMENT_OTHER): Payer: Self-pay | Admitting: Physician Assistant

## 2024-08-16 ENCOUNTER — Encounter (HOSPITAL_COMMUNITY)
Admission: RE | Disposition: A | Discharge status: Home / Self Care | Payer: Self-pay | Source: Home / Self Care | Attending: Orthopedic Surgery

## 2024-08-16 ENCOUNTER — Ambulatory Visit (HOSPITAL_COMMUNITY)
Admission: RE | Admit: 2024-08-16 | Discharge: 2024-08-16 | Disposition: A | Discharge status: Home / Self Care | Attending: Orthopedic Surgery | Admitting: Orthopedic Surgery

## 2024-08-16 ENCOUNTER — Ambulatory Visit (HOSPITAL_COMMUNITY)

## 2024-08-16 ENCOUNTER — Encounter: Payer: Self-pay | Admitting: Oncology

## 2024-08-16 DIAGNOSIS — I10 Essential (primary) hypertension: Secondary | ICD-10-CM | POA: Diagnosis not present

## 2024-08-16 DIAGNOSIS — Y831 Surgical operation with implant of artificial internal device as the cause of abnormal reaction of the patient, or of later complication, without mention of misadventure at the time of the procedure: Secondary | ICD-10-CM | POA: Diagnosis not present

## 2024-08-16 DIAGNOSIS — F32A Depression, unspecified: Secondary | ICD-10-CM

## 2024-08-16 DIAGNOSIS — T8484XA Pain due to internal orthopedic prosthetic devices, implants and grafts, initial encounter: Secondary | ICD-10-CM | POA: Diagnosis not present

## 2024-08-16 DIAGNOSIS — Z969 Presence of functional implant, unspecified: Secondary | ICD-10-CM

## 2024-08-16 DIAGNOSIS — Z472 Encounter for removal of internal fixation device: Secondary | ICD-10-CM | POA: Diagnosis not present

## 2024-08-16 HISTORY — DX: Nausea with vomiting, unspecified: R11.2

## 2024-08-16 HISTORY — DX: Personal history of urinary calculi: Z87.442

## 2024-08-16 HISTORY — PX: HARDWARE REMOVAL: SHX979

## 2024-08-16 LAB — CBC
HCT: 43.8 % (ref 36.0–46.0)
Hemoglobin: 13.9 g/dL (ref 12.0–15.0)
MCH: 27.5 pg (ref 26.0–34.0)
MCHC: 31.7 g/dL (ref 30.0–36.0)
MCV: 86.7 fL (ref 80.0–100.0)
Platelets: 402 K/uL — ABNORMAL HIGH (ref 150–400)
RBC: 5.05 MIL/uL (ref 3.87–5.11)
RDW: 13.4 % (ref 11.5–15.5)
WBC: 8.2 K/uL (ref 4.0–10.5)
nRBC: 0 % (ref 0.0–0.2)

## 2024-08-16 LAB — NO BLOOD PRODUCTS

## 2024-08-16 SURGERY — REMOVAL, HARDWARE
Anesthesia: General | Site: Hip | Laterality: Left

## 2024-08-16 MED ORDER — ALBUMIN HUMAN 5 % IV SOLN
INTRAVENOUS | Status: AC
  Filled 2024-08-16: qty 250

## 2024-08-16 MED ORDER — FENTANYL CITRATE (PF) 250 MCG/5ML IJ SOLN
INTRAMUSCULAR | Status: AC
  Filled 2024-08-16: qty 5

## 2024-08-16 MED ORDER — PHENYLEPHRINE 80 MCG/ML (10ML) SYRINGE FOR IV PUSH (FOR BLOOD PRESSURE SUPPORT)
PREFILLED_SYRINGE | INTRAVENOUS | Status: DC | PRN
  Administered 2024-08-16: 160 ug via INTRAVENOUS
  Administered 2024-08-16: 80 ug via INTRAVENOUS

## 2024-08-16 MED ORDER — OXYCODONE HCL 5 MG PO TABS
5.0000 mg | ORAL_TABLET | ORAL | 0 refills | Status: AC | PRN
  Filled 2024-08-16: qty 40, 7d supply, fill #0

## 2024-08-16 MED ORDER — MIDAZOLAM HCL 2 MG/2ML IJ SOLN
INTRAMUSCULAR | Status: DC | PRN
  Administered 2024-08-16 (×2): 1 mg via INTRAVENOUS

## 2024-08-16 MED ORDER — BUPIVACAINE-EPINEPHRINE (PF) 0.25% -1:200000 IJ SOLN
INTRAMUSCULAR | Status: DC | PRN
  Administered 2024-08-16: 30 mL

## 2024-08-16 MED ORDER — ROCURONIUM BROMIDE 10 MG/ML (PF) SYRINGE
PREFILLED_SYRINGE | INTRAVENOUS | Status: DC | PRN
  Administered 2024-08-16: 60 mg via INTRAVENOUS

## 2024-08-16 MED ORDER — MIDAZOLAM HCL 2 MG/2ML IJ SOLN
INTRAMUSCULAR | Status: AC
Start: 2024-08-16 — End: 2024-08-16
  Filled 2024-08-16: qty 2

## 2024-08-16 MED ORDER — STERILE WATER FOR IRRIGATION IR SOLN
Status: DC | PRN
  Administered 2024-08-16: 1000 mL

## 2024-08-16 MED ORDER — CHLORHEXIDINE GLUCONATE 0.12 % MT SOLN
15.0000 mL | Freq: Once | OROMUCOSAL | Status: AC
  Administered 2024-08-16: 15 mL via OROMUCOSAL
  Filled 2024-08-16: qty 15

## 2024-08-16 MED ORDER — LIDOCAINE 2% (20 MG/ML) 5 ML SYRINGE
INTRAMUSCULAR | Status: DC | PRN
  Administered 2024-08-16: 60 mg via INTRAVENOUS

## 2024-08-16 MED ORDER — ORAL CARE MOUTH RINSE: 15.0000 mL | Freq: Once | OROMUCOSAL | Status: AC

## 2024-08-16 MED ORDER — SUGAMMADEX SODIUM 200 MG/2ML IV SOLN
INTRAVENOUS | Status: DC | PRN
Start: 2024-08-16 — End: 2024-08-16
  Administered 2024-08-16: 200 mg via INTRAVENOUS

## 2024-08-16 MED ORDER — VANCOMYCIN HCL 1000 MG IV SOLR
INTRAVENOUS | Status: DC | PRN
  Administered 2024-08-16: 1000 mg via TOPICAL

## 2024-08-16 MED ORDER — ALBUMIN HUMAN 5 % IV SOLN
12.5000 g | Freq: Once | INTRAVENOUS | Status: AC
  Administered 2024-08-16: 12.5 g via INTRAVENOUS

## 2024-08-16 MED ORDER — PROPOFOL 1000 MG/100ML IV EMUL
INTRAVENOUS | Status: AC
  Filled 2024-08-16: qty 100

## 2024-08-16 MED ORDER — ONDANSETRON HCL 4 MG/2ML IJ SOLN
INTRAMUSCULAR | Status: DC | PRN
  Administered 2024-08-16: 4 mg via INTRAVENOUS

## 2024-08-16 MED ORDER — 0.9 % SODIUM CHLORIDE (POUR BTL) OPTIME
TOPICAL | Status: DC | PRN
  Administered 2024-08-16: 1000 mL

## 2024-08-16 MED ORDER — LACTATED RINGERS IV SOLN: INTRAVENOUS | Status: DC

## 2024-08-16 MED ORDER — POVIDONE-IODINE 10 % EX SWAB
2.0000 | Freq: Once | CUTANEOUS | Status: AC
  Administered 2024-08-16: 2 via TOPICAL

## 2024-08-16 MED ORDER — ACETAMINOPHEN 500 MG PO TABS
500.0000 mg | ORAL_TABLET | Freq: Three times a day (TID) | ORAL | 0 refills | Status: AC
  Filled 2024-08-16: qty 42, 14d supply, fill #0

## 2024-08-16 MED ORDER — PROPOFOL 10 MG/ML IV BOLUS
INTRAVENOUS | Status: AC
  Filled 2024-08-16: qty 20

## 2024-08-16 MED ORDER — VANCOMYCIN HCL 1000 MG IV SOLR
INTRAVENOUS | Status: AC
  Filled 2024-08-16: qty 20

## 2024-08-16 MED ORDER — ONDANSETRON HCL 4 MG/2ML IJ SOLN
INTRAMUSCULAR | Status: AC
  Filled 2024-08-16: qty 2

## 2024-08-16 MED ORDER — TRANEXAMIC ACID-NACL 1000-0.7 MG/100ML-% IV SOLN
1000.0000 mg | INTRAVENOUS | Status: AC
  Administered 2024-08-16: 1000 mg via INTRAVENOUS
  Filled 2024-08-16: qty 100

## 2024-08-16 MED ORDER — ROCURONIUM BROMIDE 10 MG/ML (PF) SYRINGE
PREFILLED_SYRINGE | INTRAVENOUS | Status: AC
  Filled 2024-08-16: qty 10

## 2024-08-16 MED ORDER — PROPOFOL 10 MG/ML IV BOLUS
INTRAVENOUS | Status: DC | PRN
Start: 2024-08-16 — End: 2024-08-16
  Administered 2024-08-16: 120 mg via INTRAVENOUS

## 2024-08-16 MED ORDER — BUPIVACAINE-EPINEPHRINE (PF) 0.25% -1:200000 IJ SOLN
INTRAMUSCULAR | Status: AC
  Filled 2024-08-16: qty 30

## 2024-08-16 MED ORDER — FENTANYL CITRATE (PF) 250 MCG/5ML IJ SOLN
INTRAMUSCULAR | Status: DC | PRN
  Administered 2024-08-16 (×5): 50 ug via INTRAVENOUS

## 2024-08-16 MED ORDER — LIDOCAINE 2% (20 MG/ML) 5 ML SYRINGE
INTRAMUSCULAR | Status: AC
  Filled 2024-08-16: qty 5

## 2024-08-16 MED ORDER — CEFAZOLIN SODIUM-DEXTROSE 2-4 GM/100ML-% IV SOLN
2.0000 g | INTRAVENOUS | Status: AC
  Administered 2024-08-16: 2 g via INTRAVENOUS
  Filled 2024-08-16: qty 100

## 2024-08-16 MED ORDER — PHENYLEPHRINE HCL-NACL 20-0.9 MG/250ML-% IV SOLN
INTRAVENOUS | Status: DC | PRN
  Administered 2024-08-16: 30 ug/min via INTRAVENOUS

## 2024-08-16 MED ORDER — SENNA 8.6 MG PO TABS
1.0000 | ORAL_TABLET | Freq: Two times a day (BID) | ORAL | 0 refills | Status: AC
  Filled 2024-08-16: qty 120, 60d supply, fill #0

## 2024-08-16 MED ORDER — DEXAMETHASONE SOD PHOSPHATE PF 10 MG/ML IJ SOLN
INTRAMUSCULAR | Status: DC | PRN
  Administered 2024-08-16: 10 mg via INTRAVENOUS

## 2024-08-16 MED ORDER — POLYETHYLENE GLYCOL 3350 17 GM/SCOOP PO POWD
17.0000 g | Freq: Every day | ORAL | 0 refills | Status: AC
  Filled 2024-08-16: qty 238, 14d supply, fill #0

## 2024-08-16 MED ORDER — PROPOFOL 500 MG/50ML IV EMUL
INTRAVENOUS | Status: DC | PRN
  Administered 2024-08-16: 150 ug/kg/min via INTRAVENOUS

## 2024-08-16 SURGICAL SUPPLY — 28 items
BNDG COHESIVE 6X5 TAN NS LF (GAUZE/BANDAGES/DRESSINGS) ×1 IMPLANT
COVER PERINEAL POST (MISCELLANEOUS) ×1 IMPLANT
COVER SURGICAL LIGHT HANDLE (MISCELLANEOUS) ×1 IMPLANT
DERMABOND ADVANCED .7 DNX12 (GAUZE/BANDAGES/DRESSINGS) IMPLANT
DRAPE C-ARM 42X72 X-RAY (DRAPES) ×1 IMPLANT
DRAPE C-ARMOR (DRAPES) ×1 IMPLANT
DRAPE SURG ORHT 6 SPLT 77X108 (DRAPES) ×1 IMPLANT
DRSG TEGADERM 4X4.75 (GAUZE/BANDAGES/DRESSINGS) ×5 IMPLANT
DRSG XEROFORM 1X8 (GAUZE/BANDAGES/DRESSINGS) IMPLANT
DURAPREP 26ML APPLICATOR (WOUND CARE) ×1 IMPLANT
GAUZE SPONGE 4X4 12PLY STRL (GAUZE/BANDAGES/DRESSINGS) ×1 IMPLANT
GAUZE XEROFORM 1X8 LF (GAUZE/BANDAGES/DRESSINGS) ×1 IMPLANT
GLOVE INDICATOR 7.5 STRL GRN (GLOVE) ×1 IMPLANT
GLOVE SS BIOGEL STRL SZ 7.5 (GLOVE) ×1 IMPLANT
GOWN STRL SURGICAL XL XLNG (GOWN DISPOSABLE) ×1 IMPLANT
KIT BASIN OR (CUSTOM PROCEDURE TRAY) ×1 IMPLANT
KIT TURNOVER KIT B (KITS) ×1 IMPLANT
PACK GENERAL/GYN (CUSTOM PROCEDURE TRAY) ×1 IMPLANT
PAD ARMBOARD POSITIONER FOAM (MISCELLANEOUS) ×1 IMPLANT
PIN GUIDE 3.2X343MM (PIN) IMPLANT
SOLN 0.9% NACL 1000 ML (IV SOLUTION) ×1 IMPLANT
SOLN 0.9% NACL POUR BTL 1000ML (IV SOLUTION) ×1 IMPLANT
SOLN STERILE WATER 1000 ML (IV SOLUTION) ×1 IMPLANT
SOLN STERILE WATER BTL 1000 ML (IV SOLUTION) ×1 IMPLANT
STAPLER VISISTAT (STAPLE) ×1 IMPLANT
SUT MNCRL AB 3-0 PS2 18 (SUTURE) IMPLANT
SUT VIC AB 0 CT1 18XCR BRD8 (SUTURE) ×1 IMPLANT
SUT VIC AB 2-0 CT1 18 (SUTURE) ×1 IMPLANT

## 2024-08-16 NOTE — Discharge Instructions (Signed)
 Orthopedic Surgery Discharge Instructions  Patient name: Teresa [name] Diagnosis: symptomatic retained left hip fixation Procedure Performed: removal of left hip retained fixation Date of Surgery: 08/16/2024 Surgeon: Ozell Ada, MD  Activity: You are allowed to put as much weight on your leg as you would like. You can walk as much as you would like. You can perform household activities such as cleaning dishes, doing laundry, vacuuming, etc. You may find it more comfortable or easier to get around with a walker. You may want to use a walker for the first 3-4 weeks until pain gets better.   Incision Care: Your incision sites have dressings over them. Those dressings should remain in place and dry at all times for a total of one week after surgery. After one week, you can remove the dressings. Underneath the dressings, you will find skin glue. You should leave the skin glue in place. The skin glue will fall off with time. Do not pick, rub, or scrub at the skin glue. Do not put cream or lotion over the surgical area. After one week and once the dressing is off, it is okay to let soap and water run over your incisions. Again, do not pick, scrub, or rub at the skin glue when bathing. Do not submerge (e.g., take a bath, swim, go in a hot tub, etc.) until six weeks after surgery. There may be some bloody drainage from the incisions into the dressings after surgery. This is normal. You do not need to replace the dressings. Continue to leave them in place for the one week as instructed above. Should the dressings become saturated with blood or drainage, please call the office for further instructions.   Medications: You have been prescribed oxycodone . This is a narcotic pain medication and should only be taken as prescribed. You should not drink alcohol or operate heavy machinery (including driving) while taking this medication. Do not take the oxycodone  with alcohol or sleeping medications. The oxycodone  can  cause constipation as a side effect. For that reason, you have been prescribed senna and miralax . These are both laxatives. You do not need to take this medication if you develop diarrhea. Should you remain constipated even while taking the senna and miralax , please use the miralax  twice daily. Tylenol  has been prescribed to be taken every 8 hours, which will give you additional pain relief.   You may resume any home blood thinners (warfarin, lovenox , apixaban, plavix, xarelto, etc) after your surgery. Take these medications as they were previously prescribed.  You can use over-the-counter NSAIDs (ibuprofen , Aleve , Celebrex , naproxen , meloxicam , etc.) for additional pain relief. Take this medications as instructed to on the bottle. Do not take these medications if you have kidney problems or have had gastrointestinal ulcers in the past.  In order to set expectations for opioid prescriptions, you will only be prescribed opioids for a total of six weeks after surgery and, at two-weeks after surgery, your opioid prescription will start to tapered (decreased dosage and number of pills). If you have ongoing need for opioid medication six weeks after surgery, you will be referred to pain management. If you are already established with a provider that is giving you opioid medications, you should schedule an appointment with them for six weeks after surgery if you feel you are going to need another prescription. State law only allows for opioid prescriptions one week at a time. If you are running out of opioid medication near the end of the week, please call the  office during business hours before running out so I can send you another prescription.   Driving: You should not drive while taking narcotic pain medications. You should start getting back to driving slowly and you may want to try driving in a parking lot before doing anything more.   Diet: You are safe to resume your regular diet after surgery.    Reasons to Call the Office After Surgery: You should feel free to call the office with any concerns or questions you have in the post-operative period, but you should definitely notify the office if you develop: -shortness of breath, chest pain, or trouble breathing -excessive bleeding, drainage, redness, or swelling around the surgical site -fevers, chills, or pain that is getting worse with each passing day -persistent nausea or vomiting -new weakness in the left leg, new or worsening numbness or tingling in the left leg -other concerns about your surgery  Follow Up Appointments: You should have an office appointment scheduled for approximately two weeks after surgery. If you do not remember when this appointment is or do not already have it scheduled, please call the office to schedule.   Office Information:  -Ozell Ada, MD -Phone number: [phone] -Address: [address]       [place], [place] [address]

## 2024-08-16 NOTE — H&P (Signed)
 Orthopedic Surgery H&P Note  Assessment: Patient is a 68 y.o. female with left hip painful retained hardware    Plan: -Consent and identify verified -Site marked -Ancef  and TXA on call to OR -WBAT BLE -Pain control -To OR when ready   Discussed recommendation for operative intervention in the form of left femur lag screw removal. Explained the risks of this procedure included, but were not limited to: femoral neck fracture, inability to remove the fixation, hardware failure, infection, bleeding, stiffness, need for additional procedures, deep vein thrombosis, pulmonary embolism, and death. The benefits of this procedure would be to remove the hardware that is felt to be contributing to her pain over the lateral hip.  The alternatives of this surgery would be tylenol , NSAIDs, topical creams, injections, PT. The patient's questions were answered to her satisfaction. After this discussion, patient elected to proceed with surgery. Informed consent was obtained.   ___________________________________________________________________________   Chief complaint: left lateral hip pain  History:  Patient is a 68 y.o. female who underwent intramedullary rodding for a left femur fracture on 02/18/2023. She eventually recovered from that injury and was no longer having any thigh pain. However, she noted pain over her lateral hip. It is worse with any pressure in that area. Her work up was consistent with trochanteric brusitis and pain from the retained lag screws. Her symptoms failed to improve with conservative treatment so hardware removal was discussed with the patient. Patient elected to proceed. She presents today with no change in her symptoms.   Review of systems: General: denies fevers and chills, myalgias Neurologic: denies recent changes in vision, slurred speech Abdomen: denies nausea, vomiting, hematemesis Respiratory: denies cough, shortness of breath  Past medical history:   Osteoporosis Melanoma s/p excision HLD Migraines L5 compression fracture Chronic pain   Allergies: morphine, tape   Past surgical history:  Melanoma excision Hysterectomy Temporal artery biopsy Cholecystectomy Cataract surgery  Left femur intramedullary rodding   Social history: Denies use of nicotine-containing products (cigarettes, vaping, smokeless, etc.) Alcohol use: denies Denies use of recreational drugs  Family history: -reviewed and not pertinent to painful retained hardware   Physical Exam:  General: no acute distress, appears stated age Neurologic: alert, answering questions appropriately, following commands Cardiovascular: regular rate, no cyanosis Respiratory: unlabored breathing on room air, symmetric chest rise Psychiatric: appropriate affect, normal cadence to speech  MSK:   -Left lower extremity  TTP over the greater trochanter in a area just proximal to her incision. No pain with log roll. Fires hip flexors, quadriceps, hamstrings, tibialis anterior, gastrocnemius and soleus, extensor hallucis longus Plantarflexes and dorsiflexes toes Sensation intact to light touch in sural, saphenous, tibial, deep peroneal, and superficial peroneal nerve distributions Foot warm and well perfused   Patient name: Teresa [name] Patient MRN: [id number] Date: 08/16/24

## 2024-08-16 NOTE — Transfer of Care (Signed)
 Immediate Anesthesia Transfer of Care Note  Patient: Teresa [name]  Procedure(s) Performed: LEFT HIP HARDWARE REMOVAL (Left: Hip)  Patient Location: PACU  Anesthesia Type:General  Level of Consciousness: awake, alert , and oriented  Airway & Oxygen Therapy: Patient Spontanous Breathing and Patient connected to nasal cannula oxygen  Post-op Assessment: Report given to RN and Post -op Vital signs reviewed and stable  Post vital signs: Reviewed and stable  Last Vitals:  Vitals Value Taken Time  BP 99/46 08/16/24 15:30  Temp 98 1533  Pulse 92 08/16/24 15:32  Resp 8 08/16/24 15:32  SpO2 98 % 08/16/24 15:32  Vitals shown include unfiled device data.  Last Pain:  Vitals:   08/16/24 1052  TempSrc:   PainSc: 5       Patients Stated Pain Goal: 3 (08/16/24 1052)  Complications: No notable events documented.

## 2024-08-16 NOTE — Anesthesia Procedure Notes (Signed)
 Procedure Name: Intubation Date/Time: 08/16/2024 1:28 PM  Performed by: Jolynn Mage, CRNAPre-anesthesia Checklist: Patient identified, Patient being monitored, Timeout performed, Emergency Drugs available and Suction available Patient Re-evaluated:Patient Re-evaluated prior to induction Oxygen Delivery Method: Circle system utilized Preoxygenation: Pre-oxygenation with 100% oxygen Induction Type: IV induction Ventilation: Mask ventilation without difficulty Laryngoscope Size: Miller and 2 Grade View: Grade I Tube type: Oral Tube size: 7.0 mm Number of attempts: 1 Airway Equipment and Method: Stylet Placement Confirmation: ETT inserted through vocal cords under direct vision, positive ETCO2 and breath sounds checked- equal and bilateral Secured at: 21 cm Tube secured with: Tape Dental Injury: Teeth and Oropharynx as per pre-operative assessment

## 2024-08-16 NOTE — Brief Op Note (Signed)
 08/16/2024  3:31 PM  PATIENT:  Teresa [name]  68 y.o. female  PRE-OPERATIVE DIAGNOSIS:  SYMPTOMATIC HARDWARE  POST-OPERATIVE DIAGNOSIS:  SYMPTOMATIC HARDWARE  PROCEDURE:  Procedure(s) with comments: LEFT HIP HARDWARE REMOVAL (Left) - REMOVAL LEFT HIP LAG SCREWS  SURGEON:  Surgeons and Role:    DEWAINE Georgina Ozell DELENA, MD - Primary  PHYSICIAN ASSISTANT: none  ASSISTANTS: none   ANESTHESIA: general  EBL:  200 mL   BLOOD ADMINISTERED:none  DRAINS: none   LOCAL MEDICATIONS USED:  MARCAINE      SPECIMEN:  No Specimen  DISPOSITION OF SPECIMEN:  N/A  COUNTS:  YES  TOURNIQUET:  NONE  DICTATION: .Note written in EPIC  PLAN OF CARE: Discharge to home after PACU  PATIENT DISPOSITION:  PACU - hemodynamically stable.   Delay start of Pharmacological VTE agent (>24hrs) due to surgical blood loss or risk of bleeding: no

## 2024-08-16 NOTE — Op Note (Signed)
 Orthopedic Surgery Operative Report   Procedure: Left hip removal of painful, retained orthopedic hardware (removed the two lag screws into the femoral head)   Modifier: none   Date of procedure: 08/17/2023   Patient name: Teresa [name]   MRN: [id number]  DOB: [birth year]   Surgeon: Ozell Ada, MD Assistant: none Pre-operative diagnosis: painful retained orthopedic implants Post-operative diagnosis: same as above Findings: two lag screws in the femoral ead successfully removed   Specimens: none Anesthesia: general EBL: 200cc Complications: none Pre-incision antibiotic: ancef  TXA given prior to incision as well    Implants:  No new implants    Indication for procedure: Patient is a 68 y.o. female who had sustained a left femur fracture.  She underwent intramedullary rodding on 02/18/2023.  Her fracture successfully healed with time.  However, she had persistent lateral hip pain in the area of the lag screws near the greater trochanter.  Her pain failed to resolve with time and conservative treatment.  Accordingly, removal of the lag screws was discussed with the patient as an option.  The risks, benefits, and alternatives of this proposed procedure were covered with the patient.  After this conversation, all of her questions were answered to her satisfaction and she elected to proceed..    Procedure Description: The patient was met in the pre-operative holding area. The patient's identity and consent were verified. The operative site was marked by myself. The patient's remaining questions about the surgery were answered. The patient was brought back to the operating room. General anesthesia was induced and an endotracheal tube was placed by the anesthesia staff. The patient was transferred to the [facility] table. All bony prominences were well padded. The surgical area was cleansed with alcohol. Ancef  and TXA were administered by anesthesia. The patient's skin was then prepped and draped  in a standard, sterile fashion. A time out was performed that identified the patient, the procedure, and the operative site. All team members agreed with what was stated in the time out.    An incision was in the area where the lag screws were inserted which was the second most proximal incision.  This was taken down sharply through skin, dermis, and fascia.  A key elevator was then used to elevate the soft tissue off of the lag screws.  The lag screws were then palpable within the wound.  A screwdriver placed into the wound and was able to be inserted into the more distal lag screw.  This lag screw was successfully removed.  The screwdriver for the more proximal lag screw was inserted.  There is difficulty in getting it to interlock with the lag screw.  A curette was used to clear further soft tissue off of the screw.  The screwdriver was then able to interdigitate with the lag screw.  However, it was very difficult to get to unlock.  My concern was that there was a locking screw proximally that was pulling the lag screw in place.  The prior operative note did not dictate whether this was used or not.  Given the great difficulty with removing the lag screw, I felt that it was certainly possible there was a locking screw more proximally in the nail.  Fluoroscopy did not show this set but I felt that it was likely obscured by the rod itself.  A second incision was made at the most proximal incision where the rod and inserted.  This was taken down sharply through skin, dermis, and fascia.  A key  elevator was then used to elevate the soft tissue off of the rod.  Under fluoroscopic guidance, the locking screw screwdriver was then inserted into the rod.  No locking screw was felt in the screws or was able to be placed right up against the remaining lag screw.  A second attempt was made to remove the lag screw.  This time it was able to be screwed out slightly.  For that reason, I felt that there was no set cap locking  the lag screw in place.  I was then able to gradually unscrew the lag screw and remove it from the bone.  At this point, both lag screws have been successfully delivered out of the femur and wound.  There is no palpable remaining foreign material over the greater trochanter.  AP and lateral fluoroscopic images confirm successful removal of the lag screws.  These images did not demonstrate any femoral neck fracture either.  The wounds were copiously irrigated with sterile saline.  Vancomycin powder was placed in the wounds.  The fascia was closed with 0 vicryl. The deep dermal layer was closed with 2-0 vicryl. The skin was closed with 3-0 Monocryl.  Dermabond was applied over the 2 incisions.  Dressings were applied. All counts were correct at the end of the case. Patient was transferred back to a stretcher. The patient was awakened from anesthesia and brought back to the post-anesthesia care unit in stable condition.     Post-operative plan: The patient will recover in the post-anesthesia care unit with plan to go home.  The dressing should remain on for 1 week after which the patient can take them off and shower.  The patient will be weightbearing as tolerated with a walker.  The patient will be seen in the office in approximately 2 weeks.    Ozell Ada, MD Orthopedic Surgeon

## 2024-08-17 ENCOUNTER — Encounter: Payer: Self-pay | Admitting: Orthopedic Surgery

## 2024-08-17 NOTE — Anesthesia Postprocedure Evaluation (Signed)
 Anesthesia Post Note  Patient: TRESSA MALDONADO  Procedure(s) Performed: LEFT HIP HARDWARE REMOVAL (Left: Hip)     Patient location during evaluation: PACU Anesthesia Type: General Level of consciousness: awake and alert Pain management: pain level controlled Vital Signs Assessment: post-procedure vital signs reviewed and stable Respiratory status: spontaneous breathing, nonlabored ventilation, respiratory function stable and patient connected to nasal cannula oxygen Cardiovascular status: blood pressure returned to baseline and stable Postop Assessment: no apparent nausea or vomiting Anesthetic complications: no   There were no known notable events for this encounter.  Last Vitals:  Vitals:   08/16/24 1630 08/16/24 1640  BP: (!) 103/50 (!) 103/50  Pulse: 97 96  Resp: 10 12  Temp:  36.9 C  SpO2: 98% 98%    Last Pain:  Vitals:   08/16/24 1640  TempSrc:   PainSc: 7                  Thom JONELLE Peoples

## 2024-08-18 ENCOUNTER — Encounter (HOSPITAL_COMMUNITY): Payer: Self-pay | Admitting: Orthopedic Surgery

## 2024-08-24 ENCOUNTER — Ambulatory Visit: Admitting: Orthopedic Surgery

## 2024-08-29 ENCOUNTER — Ambulatory Visit: Admitting: Orthopedic Surgery

## 2024-08-29 DIAGNOSIS — Z9889 Other specified postprocedural states: Secondary | ICD-10-CM

## 2024-08-29 MED ORDER — HYDROMORPHONE HCL 4 MG PO TABS: 4.0000 mg | ORAL_TABLET | ORAL | 0 refills | Status: AC | PRN

## 2024-08-29 NOTE — Progress Notes (Signed)
 Orthopedic Surgery Post-operative Office Visit  Procedure: left hip removal of lag screws Date of Surgery: 08/16/2024 (~2 weeks post-op)  Assessment: Patient is a 68 y.o. who is having significant lateral hip pain after surgery   Plan: -Operative plans complete - She does have a hematoma in the area of the caudal incision so we will monitor that -Encouraged her to continue to use a walker to offload the hip -Okay to let soap/water run over the incision but do not submerge -Pain management: dilaudid , tylenol  -Return to office in 4 weeks, x-rays needed at next visit: none  ___________________________________________________________________________   Subjective: Patient has had significant pain since surgery.  She notices the pain at the inferior incision which was where the lag screws were removed.  She is also noticed ecchymosis just distal to the incision site.  She has not had any redness or drainage around the incision.  She has had difficulty with sleep at night as a result of the pain.  She has been using a walker to ambulate.  Objective:  General: no acute distress, appropriate affect Neurologic: alert, answering questions appropriately, following commands Respiratory: unlabored breathing on room air Skin: incisions are well-approximated with no erythema, induration, active/expressible drainage. There is swelling consistent with a hematoma near the more inferior incision  MSK (LLE): ambulating with cane, EHL/TA/GSC intact, sensation intact to light touch in sural/saphenous/deep peroneal/superficial peroneal/tibial nerve distributions, foot warm well-perfused   Imaging: None obtained at today's visit   Patient name: Teresa [name] Patient MRN: [id number] Date of visit: 08/29/24

## 2024-09-05 ENCOUNTER — Encounter: Payer: Self-pay | Admitting: Radiology

## 2024-09-15 ENCOUNTER — Encounter: Admitting: Orthopedic Surgery

## 2024-09-27 ENCOUNTER — Other Ambulatory Visit (HOSPITAL_COMMUNITY): Payer: Self-pay

## 2024-11-17 ENCOUNTER — Other Ambulatory Visit (INDEPENDENT_AMBULATORY_CARE_PROVIDER_SITE_OTHER): Payer: Self-pay

## 2024-11-17 ENCOUNTER — Ambulatory Visit: Admitting: Orthopedic Surgery

## 2024-11-17 DIAGNOSIS — Z9889 Other specified postprocedural states: Secondary | ICD-10-CM | POA: Diagnosis not present

## 2024-11-17 MED ORDER — AZITHROMYCIN 500 MG PO TABS: 500.0000 mg | ORAL_TABLET | Freq: Every day | ORAL | 0 refills | Status: AC

## 2024-11-17 MED ORDER — AZITHROMYCIN 500 MG PO TABS: 500.0000 mg | ORAL_TABLET | Freq: Every day | ORAL | 0 refills | Status: DC

## 2024-11-17 NOTE — Progress Notes (Signed)
 Orthopedic Surgery Post-operative Office Visit   Procedure: left hip removal of lag screws Date of Surgery: 08/16/2024 (~3 months post-op)   Assessment: Patient is a 69 y.o. who is now having more proximal lateral hip pain near the most proximal incision     Plan: -Operative plans complete -Hematoma has resolved so no further treatment -Talked about an MRI as further workup but she wanted to hold off on that for now -She is going to reestablish with pain management and see how they can help with her pain since she does have multiple areas with pain -Okay to submerge wounds at this point -Weightbearing as tolerated -I do not typically prescribe antibiotics for sinus infections but would write her for azithromycin  this 1 time since she has been having difficulty getting seen about her sinus/upper respiratory symptoms -Return to office in 2 months, x-rays needed at next visit: AP/lateral left hip   ___________________________________________________________________________     Subjective: Patient has noticed continued hip pain since surgery.  She is no longer noticing over the lateral aspect of the hip but is noticing it more proximal near the proximal incision.  She says she has trouble laying on that side at night as a result of the pain.  She notes it to if she does a lot of walking.  She has transitioned herself to ambulating without any assistive devices.  She also is complaining that her right shoulder hurts which makes it difficult to sleep on that side so she then sleeps on the left side with her left hip hurts when she switches back.  This shifting around has made it difficult to get a good night sleep.  She also is complaining that her left foot feels colder.  She has to wear multiple layers at night to keep it warm.  She also has been complaining of a sinus infection and continued drainage.  She talked to her PCP who said they would not prescribe any antibiotics.   Objective:    General: no acute distress, appropriate affect Neurologic: alert, answering questions appropriately, following commands Respiratory: unlabored breathing on room air Skin: incisions are well healed with no erythema, induration, active/expressible drainage. Hematoma near the inferior incision has resolved   MSK (LLE): ambulating with no assistive devices, EHL/TA/GSC intact, sensation intact to light touch in sural/saphenous/deep peroneal/superficial peroneal/tibial nerve distributions, foot warm well-perfused. TTP with superficial palpation over the proximalmost incision and in the immediately adjacent area.  No other tenderness palpation over the hip including over the second most proximal incision where the lag screws were removed.     Imaging: XRs of the left hip from 11/17/2024 were independently reviewed and interpreted, showing callus formation around a prior femur fracture.  Interval removal of the lag screws at the proximal aspect of the nail is noted.  Subtle lucency where the screws were previously in the femoral neck and head.  No fracture or dislocation seen.  Small amount of joint space narrowing seen within the left hip joint.     Patient name: Teresa [name] Patient MRN: [id number] Date of visit:  11/17/24

## 2024-12-01 ENCOUNTER — Ambulatory Visit: Admitting: Orthopedic Surgery
